# Patient Record
Sex: Female | Born: 1935 | Race: White | Hispanic: No | Smoking: Never smoker
Health system: Southern US, Community
[De-identification: ages and names within clinical notes are randomized; demographics above are authoritative.]

## PROBLEM LIST (undated history)

## (undated) DIAGNOSIS — R3915 Urgency of urination: Secondary | ICD-10-CM

## (undated) DIAGNOSIS — D649 Anemia, unspecified: Secondary | ICD-10-CM

## (undated) DIAGNOSIS — R32 Unspecified urinary incontinence: Secondary | ICD-10-CM

## (undated) DIAGNOSIS — G459 Transient cerebral ischemic attack, unspecified: Secondary | ICD-10-CM

## (undated) DIAGNOSIS — M810 Age-related osteoporosis without current pathological fracture: Secondary | ICD-10-CM

## (undated) DIAGNOSIS — G20A1 Parkinson's disease without dyskinesia, without mention of fluctuations: Secondary | ICD-10-CM

## (undated) DIAGNOSIS — C801 Malignant (primary) neoplasm, unspecified: Secondary | ICD-10-CM

## (undated) DIAGNOSIS — H269 Unspecified cataract: Secondary | ICD-10-CM

## (undated) DIAGNOSIS — M1990 Unspecified osteoarthritis, unspecified site: Secondary | ICD-10-CM

## (undated) DIAGNOSIS — I4891 Unspecified atrial fibrillation: Secondary | ICD-10-CM

## (undated) DIAGNOSIS — K449 Diaphragmatic hernia without obstruction or gangrene: Secondary | ICD-10-CM

## (undated) DIAGNOSIS — I10 Essential (primary) hypertension: Secondary | ICD-10-CM

## (undated) DIAGNOSIS — N952 Postmenopausal atrophic vaginitis: Secondary | ICD-10-CM

## (undated) DIAGNOSIS — C50919 Malignant neoplasm of unspecified site of unspecified female breast: Secondary | ICD-10-CM

## (undated) DIAGNOSIS — G20 Parkinson's disease: Secondary | ICD-10-CM

## (undated) DIAGNOSIS — K649 Unspecified hemorrhoids: Secondary | ICD-10-CM

## (undated) DIAGNOSIS — E538 Deficiency of other specified B group vitamins: Secondary | ICD-10-CM

## (undated) DIAGNOSIS — I2699 Other pulmonary embolism without acute cor pulmonale: Secondary | ICD-10-CM

## (undated) DIAGNOSIS — O2230 Deep phlebothrombosis in pregnancy, unspecified trimester: Secondary | ICD-10-CM

## (undated) DIAGNOSIS — K5790 Diverticulosis of intestine, part unspecified, without perforation or abscess without bleeding: Secondary | ICD-10-CM

## (undated) DIAGNOSIS — H15009 Unspecified scleritis, unspecified eye: Secondary | ICD-10-CM

## (undated) HISTORY — PX: INCONTINENCE SURGERY: SHX676

## (undated) HISTORY — PX: OTHER SURGICAL HISTORY: SHX169

## (undated) HISTORY — PX: NEUROMA SURGERY: SHX722

## (undated) HISTORY — DX: Unspecified osteoarthritis, unspecified site: M19.90

## (undated) HISTORY — DX: Age-related osteoporosis without current pathological fracture: M81.0

## (undated) HISTORY — DX: Parkinson's disease: G20

## (undated) HISTORY — PX: ABDOMINAL HYSTERECTOMY: SUR658

## (undated) HISTORY — DX: Diaphragmatic hernia without obstruction or gangrene: K44.9

## (undated) HISTORY — DX: Unspecified urinary incontinence: R32

## (undated) HISTORY — DX: Transient cerebral ischemic attack, unspecified: G45.9

## (undated) HISTORY — PX: FINGER SURGERY: SHX640

## (undated) HISTORY — DX: Unspecified cataract: H26.9

## (undated) HISTORY — DX: Postmenopausal atrophic vaginitis: N95.2

## (undated) HISTORY — DX: Parkinson's disease without dyskinesia, without mention of fluctuations: G20.A1

## (undated) HISTORY — PX: FOOT SURGERY: SHX648

## (undated) HISTORY — DX: Unspecified scleritis, unspecified eye: H15.009

## (undated) HISTORY — DX: Unspecified hemorrhoids: K64.9

## (undated) HISTORY — DX: Malignant neoplasm of unspecified site of unspecified female breast: C50.919

## (undated) HISTORY — DX: Urgency of urination: R39.15

## (undated) HISTORY — PX: ABDOMINAL HYSTERECTOMY: SHX81

## (undated) HISTORY — PX: EYE SURGERY: SHX253

---

## 1995-01-29 DIAGNOSIS — G459 Transient cerebral ischemic attack, unspecified: Secondary | ICD-10-CM

## 1995-01-29 HISTORY — DX: Transient cerebral ischemic attack, unspecified: G45.9

## 2003-06-29 HISTORY — PX: FOOT FRACTURE SURGERY: SHX645

## 2003-07-14 ENCOUNTER — Other Ambulatory Visit: Payer: Self-pay

## 2004-05-31 ENCOUNTER — Emergency Department: Payer: Self-pay | Admitting: Unknown Physician Specialty

## 2004-06-13 ENCOUNTER — Ambulatory Visit: Payer: Self-pay | Admitting: Ophthalmology

## 2004-06-15 ENCOUNTER — Ambulatory Visit: Payer: Self-pay | Admitting: Ophthalmology

## 2004-07-16 ENCOUNTER — Ambulatory Visit: Payer: Self-pay | Admitting: Internal Medicine

## 2005-08-21 ENCOUNTER — Ambulatory Visit: Payer: Self-pay | Admitting: Internal Medicine

## 2006-04-28 ENCOUNTER — Emergency Department: Payer: Self-pay | Admitting: Emergency Medicine

## 2006-05-13 ENCOUNTER — Ambulatory Visit: Payer: Self-pay | Admitting: Gastroenterology

## 2006-07-29 ENCOUNTER — Other Ambulatory Visit: Payer: Self-pay

## 2006-07-29 ENCOUNTER — Ambulatory Visit: Payer: Self-pay | Admitting: Ophthalmology

## 2006-08-05 ENCOUNTER — Ambulatory Visit: Payer: Self-pay | Admitting: Ophthalmology

## 2006-10-02 ENCOUNTER — Ambulatory Visit: Payer: Self-pay | Admitting: Ophthalmology

## 2006-10-28 ENCOUNTER — Ambulatory Visit: Payer: Self-pay | Admitting: Ophthalmology

## 2007-04-04 ENCOUNTER — Emergency Department: Payer: Self-pay | Admitting: Emergency Medicine

## 2007-08-26 ENCOUNTER — Ambulatory Visit: Payer: Self-pay | Admitting: Internal Medicine

## 2008-09-01 ENCOUNTER — Ambulatory Visit: Payer: Self-pay | Admitting: Internal Medicine

## 2009-09-14 ENCOUNTER — Ambulatory Visit: Payer: Self-pay | Admitting: Internal Medicine

## 2010-07-17 ENCOUNTER — Emergency Department: Payer: Self-pay | Admitting: *Deleted

## 2010-10-22 ENCOUNTER — Ambulatory Visit: Payer: Self-pay | Admitting: Internal Medicine

## 2011-10-23 ENCOUNTER — Ambulatory Visit: Payer: Self-pay

## 2012-10-26 ENCOUNTER — Ambulatory Visit: Payer: Self-pay

## 2012-10-28 HISTORY — PX: BREAST BIOPSY: SHX20

## 2012-11-06 ENCOUNTER — Ambulatory Visit: Payer: Self-pay

## 2012-11-13 ENCOUNTER — Ambulatory Visit: Payer: Self-pay

## 2012-11-16 LAB — PATHOLOGY REPORT

## 2012-12-02 ENCOUNTER — Ambulatory Visit: Payer: Self-pay | Admitting: Surgery

## 2012-12-08 ENCOUNTER — Ambulatory Visit: Payer: Self-pay | Admitting: Surgery

## 2012-12-09 LAB — PATHOLOGY REPORT

## 2013-08-16 ENCOUNTER — Emergency Department: Payer: Self-pay | Admitting: Emergency Medicine

## 2013-08-16 LAB — CBC WITH DIFFERENTIAL/PLATELET
BASOS ABS: 0.1 10*3/uL (ref 0.0–0.1)
BASOS PCT: 1.4 %
EOS ABS: 0.3 10*3/uL (ref 0.0–0.7)
Eosinophil %: 3.9 %
HCT: 43.7 % (ref 35.0–47.0)
HGB: 14.3 g/dL (ref 12.0–16.0)
Lymphocyte #: 2.4 10*3/uL (ref 1.0–3.6)
Lymphocyte %: 33.7 %
MCH: 30.4 pg (ref 26.0–34.0)
MCHC: 32.6 g/dL (ref 32.0–36.0)
MCV: 93 fL (ref 80–100)
MONO ABS: 0.8 x10 3/mm (ref 0.2–0.9)
MONOS PCT: 11.4 %
NEUTROS PCT: 49.6 %
Neutrophil #: 3.5 10*3/uL (ref 1.4–6.5)
PLATELETS: 240 10*3/uL (ref 150–440)
RBC: 4.7 10*6/uL (ref 3.80–5.20)
RDW: 14 % (ref 11.5–14.5)
WBC: 7 10*3/uL (ref 3.6–11.0)

## 2013-08-16 LAB — COMPREHENSIVE METABOLIC PANEL
ANION GAP: 8 (ref 7–16)
Albumin: 3.4 g/dL (ref 3.4–5.0)
Alkaline Phosphatase: 76 U/L
BUN: 18 mg/dL (ref 7–18)
Bilirubin,Total: 0.5 mg/dL (ref 0.2–1.0)
CALCIUM: 8.9 mg/dL (ref 8.5–10.1)
CO2: 27 mmol/L (ref 21–32)
Chloride: 106 mmol/L (ref 98–107)
Creatinine: 0.68 mg/dL (ref 0.60–1.30)
EGFR (African American): >60
Glucose: 90 mg/dL (ref 65–99)
OSMOLALITY: 283 (ref 275–301)
POTASSIUM: 3.3 mmol/L — AB (ref 3.5–5.1)
SGOT(AST): 19 U/L (ref 15–37)
SGPT (ALT): 12 U/L (ref 12–78)
Sodium: 141 mmol/L (ref 136–145)
TOTAL PROTEIN: 7.3 g/dL (ref 6.4–8.2)

## 2013-08-16 LAB — URINALYSIS, COMPLETE
Bilirubin,UR: NEGATIVE
Glucose,UR: NEGATIVE mg/dL (ref 0–75)
NITRITE: NEGATIVE
Ph: 5 (ref 4.5–8.0)
Protein: 100
RBC,UR: 1423 /HPF (ref 0–5)
SPECIFIC GRAVITY: 1.014 (ref 1.003–1.030)
Squamous Epithelial: 2
WBC UR: 3663 /HPF (ref 0–5)

## 2013-08-16 LAB — LIPASE, BLOOD: Lipase: 81 U/L (ref 73–393)

## 2013-08-16 LAB — TROPONIN I: Troponin-I: <0.02 ng/mL

## 2013-08-18 LAB — URINE CULTURE

## 2013-12-09 ENCOUNTER — Ambulatory Visit: Payer: Self-pay

## 2014-05-20 NOTE — Op Note (Signed)
PATIENT NAME:  Laura Cisneros, Laura Cisneros MR#:  [id number] DATE OF BIRTH:  07/05/1925  DATE OF PROCEDURE:  12/08/2012  PREOPERATIVE DIAGNOSIS: Left breast mass.   POSTOPERATIVE DIAGNOSIS: Left breast mass.   PROCEDURE: Excision of left breast mass.   SURGEON: Rochel Brome, M.D.   ANESTHESIA: General.   INDICATIONS: This 81 year old female recently had an ultrasound finding of a complex lesion at the 3 o'clock position of the left breast. She had ultrasound-guided biopsy, which demonstrated a complex lesion with benign findings; however, there remains suspicion for this mass and excision was recommended. She did have preoperative ultrasound guided needle localization. It is noted that with her prior biopsy. the biopsy marker was some 4 cm from the nodule and therefore the plan was not to remove the biopsy marker today, but just the mass.   DESCRIPTION OF THE PROCEDURE: The patient was placed on the operating table in the supine position under general anesthesia. The dressing was removed from the lateral aspect of the left breast exposing the Kopans wire, which was cut 2 cm from the skin. There was an X marking the skin overlying the mass. The breast was prepared with ChloraPrep and draped in a sterile manner.   A curvilinear incision was made in the outer aspect of the left breast from approximately 2 o'clock position to 3 o'clock position, approximately 4 cm from the nipple and carried down through subcutaneous tissues. Several small bleeding points were cauterized. The wire was encountered and dissected out a portion of tissue surrounding the wire and I could feel some firmness and mass formation within the specimen during the course of the dissection and excised the specimen and submitted with the wire in if for routine pathology. The wound was inspected. Several small bleeding points were cauterized. Deeper tissues around the cardia artifact were infiltrated with 0.5% Sensorcaine with epinephrine and also  subcutaneous tissues were infiltrated as well.   Next, the subcutaneous tissues were closed with interrupted 4-0 chromic. The skin was closed with running 4-0 Monocryl subcuticular suture and Dermabond. The patient tolerated surgery satisfactorily and was then prepared for transfer to the recovery room.   ____________________________ [name]. Rochel Brome, MD jws:aw D: 12/08/2012 11:38:48 ET T: 12/08/2012 11:59:44 ET JOB#: [id number]  cc: Loreli Dollar, MD, <Dictator> Loreli Dollar MD ELECTRONICALLY SIGNED 12/10/2012 10:26

## 2014-11-13 ENCOUNTER — Emergency Department
Admission: EM | Admit: 2014-11-13 | Discharge: 2014-11-13 | Disposition: A | Discharge status: Home / Self Care | Payer: Medicare Other | Attending: Emergency Medicine | Admitting: Emergency Medicine

## 2014-11-13 DIAGNOSIS — N390 Urinary tract infection, site not specified: Secondary | ICD-10-CM

## 2014-11-13 DIAGNOSIS — R350 Frequency of micturition: Secondary | ICD-10-CM | POA: Diagnosis present

## 2014-11-13 LAB — URINALYSIS COMPLETE WITH MICROSCOPIC (ARMC ONLY)
Bilirubin Urine: NEGATIVE
Glucose, UA: NEGATIVE mg/dL
NITRITE: NEGATIVE
PH: 5 (ref 5.0–8.0)
PROTEIN: 100 mg/dL — AB
SPECIFIC GRAVITY, URINE: 1.016 (ref 1.005–1.030)

## 2014-11-13 MED ORDER — CIPROFLOXACIN HCL 500 MG PO TABS: 500.0000 mg | ORAL_TABLET | Freq: Two times a day (BID) | ORAL | Status: DC

## 2014-11-13 MED ORDER — FLUCONAZOLE 150 MG PO TABS: 150.0000 mg | ORAL_TABLET | Freq: Every day | ORAL | Status: DC

## 2014-11-13 NOTE — ED Notes (Signed)
C/o urinary frequency and burning along with foul odor. No confusion noted.  Has a history  UTIs in the past.

## 2014-11-13 NOTE — ED Provider Notes (Signed)
[facility] Emergency Department Provider Note  ____________________________________________  Time seen: 10:20 AM  I have reviewed the triage vital signs and the nursing notes.   HISTORY  Chief Complaint Urinary Frequency    HPI Laura Cisneros is a 81 y.o. female who presents with complaints of dysuria and frequency. She notes this started approximately 2 days ago. She feels that she is not voiding completely and is voiding more frequently as well. She has mild discomfort with urinating. She reports this is exactly like a urinary tract infection in the past. She has not had any confusion according to her family. No fevers no chills. No back pain. No abdominal pain or flank pain. No nausea no vomiting. She reports she feels quite well and was considering waiting to go to urgent care tomorrow but decided it would be more convenient to come to the emergency department today so that does not get worse     No past medical history on file.  There are no active problems to display for this patient.   No past surgical history on file.  Current Outpatient Rx  Name  Route  Sig  Dispense  Refill  . ciprofloxacin (CIPRO) 500 MG tablet   Oral   Take 1 tablet (500 mg total) by mouth 2 (two) times daily.   14 tablet   0   . fluconazole (DIFLUCAN) 150 MG tablet   Oral   Take 1 tablet (150 mg total) by mouth daily.   2 tablet   0     One tablet on day 1 for yeast infection followed b ...     Allergies Review of patient's allergies indicates no known allergies.  No family history on file.  Social History Social History  Substance Use Topics  . Smoking status: Not on file  . Smokeless tobacco: Not on file  . Alcohol Use: Not on file    Review of Systems  Constitutional: Negative for fever. Eyes: Negative for discharge ENT: Negative for sore throat Cardiovascular: Negative for chest pain. Respiratory: Negative for shortness of  breath. Gastrointestinal: Negative for abdominal pain, vomiting  Genitourinary: Positive for dysuria, frequency Musculoskeletal: Negative for back pain. Skin: Negative for rash. Neurological: Negative for headaches      ____________________________________________   PHYSICAL EXAM:  VITAL SIGNS: ED Triage Vitals  Enc Vitals Group     BP 11/13/14 0907 125/69 mmHg     Pulse Rate 11/13/14 0907 14     Resp 11/13/14 0907 20     Temp 11/13/14 0907 98 F (36.7 C)     Temp Source 11/13/14 0907 Oral     SpO2 11/13/14 0907 94 %     Weight 11/13/14 0907 141 lb (63.957 kg)     Height 11/13/14 0907 5' (1.524 m)     Head Cir --      Peak Flow --      Pain Score --      Pain Loc --      Pain Edu? --      Excl. in [place]? --      Constitutional: Alert and oriented. Well appearing and in no distress. Pleasant and interactive Eyes: Conjunctivae are normal.  ENT   Head: Normocephalic and atraumatic.   Mouth/Throat: Mucous membranes are moist. Cardiovascular: Normal rate, regular rhythm. Normal and symmetric distal pulses are present in all extremities.  Respiratory: Normal respiratory effort without tachypnea nor retractions.  Gastrointestinal: Soft and non-tender in all quadrants. No distention. There  is no CVA tenderness. Genitourinary: deferred Musculoskeletal: Nontender with normal range of motion in all extremities.  Neurologic:  Normal speech and language. No gross focal neurologic deficits are appreciated. Skin:  Skin is warm, dry and intact. No rash noted. Psychiatric: Mood and affect are normal. Patient exhibits appropriate insight and judgment.  ____________________________________________    LABS (pertinent positives/negatives)  Labs Reviewed  URINALYSIS COMPLETEWITH MICROSCOPIC ([facility] ONLY) - Abnormal; Notable for the following:    Color, Urine YELLOW (*)    APPearance CLOUDY (*)    Ketones, ur TRACE (*)    Hgb urine dipstick 3+ (*)    Protein, ur 100 (*)     Leukocytes, UA 3+ (*)    Bacteria, UA RARE (*)    Squamous Epithelial / LPF 6-30 (*)    All other components within normal limits    ____________________________________________   EKG  None  ____________________________________________    RADIOLOGY I have personally reviewed any xrays that were ordered on this patient: None  ____________________________________________   PROCEDURES  Procedure(s) performed: none  Critical Care performed: none  ____________________________________________   INITIAL IMPRESSION / ASSESSMENT AND PLAN / ED COURSE  Pertinent labs & imaging results that were available during my care of the patient were reviewed by me and considered in my medical decision making (see chart for details).  History of present illness certainly consistent with urinary tract infection and urinalysis further supports that diagnosis. Patient is extremely well-appearing. She is nontoxic. Her vitals are unremarkable. Have no concern at this time of sepsis or pyelonephritis and feel that outpatient treatment is completely appropriate. I did discuss signs of a worsening urinary tract infection with the patient and her son and they agree that they will return if there is any concern  ____________________________________________   FINAL CLINICAL IMPRESSION(S) / ED DIAGNOSES  Final diagnoses:  UTI (lower urinary tract infection)      Lavonia Drafts, MD 11/13/14 1049

## 2014-11-13 NOTE — ED Notes (Signed)
AAOx3.  Skin warm and dry. Moving all extremities equally and strong.  NAD 

## 2014-11-13 NOTE — Discharge Instructions (Signed)

## 2014-11-13 NOTE — ED Notes (Signed)
Pt c/o increased urination, with incontinence, pain with urination since Friday..  Denies fever or other sx.Marland Kitchen

## 2015-09-06 ENCOUNTER — Encounter: Payer: Self-pay | Admitting: Urology

## 2015-09-06 ENCOUNTER — Other Ambulatory Visit: Payer: Self-pay

## 2015-09-06 ENCOUNTER — Ambulatory Visit (INDEPENDENT_AMBULATORY_CARE_PROVIDER_SITE_OTHER): Payer: Medicare Other | Admitting: Urology

## 2015-09-06 VITALS — BP 128/71 | HR 69 | Ht 60.0 in | Wt 142.2 lb

## 2015-09-06 DIAGNOSIS — R350 Frequency of micturition: Secondary | ICD-10-CM

## 2015-09-06 DIAGNOSIS — R32 Unspecified urinary incontinence: Secondary | ICD-10-CM | POA: Diagnosis not present

## 2015-09-06 DIAGNOSIS — N952 Postmenopausal atrophic vaginitis: Secondary | ICD-10-CM | POA: Diagnosis not present

## 2015-09-06 DIAGNOSIS — N390 Urinary tract infection, site not specified: Secondary | ICD-10-CM

## 2015-09-06 LAB — URINALYSIS, COMPLETE
Bilirubin, UA: NEGATIVE
GLUCOSE, UA: NEGATIVE
Nitrite, UA: NEGATIVE
PH UA: 5 (ref 5.0–7.5)
PROTEIN UA: NEGATIVE
RBC, UA: NEGATIVE
Specific Gravity, UA: 1.025 (ref 1.005–1.030)
UUROB: 0.2 mg/dL (ref 0.2–1.0)

## 2015-09-06 LAB — MICROSCOPIC EXAMINATION

## 2015-09-06 LAB — BLADDER SCAN AMB NON-IMAGING: Scan Result: 0

## 2015-09-06 NOTE — Progress Notes (Signed)
09/06/2015 9:34 PM   Laura Cisneros 06-Apr-1925 [id number]  Referring provider: Leonel Ramsay, MD [name], [name] [address]  Chief Complaint  Patient presents with  . Recurrent UTI    referred by Dr.Fitzgerald  . Urinary Frequency    HPI: Patient is an 82 year old Caucasian female who is referred by her PCP, Dr. Ola Cisneros, for urinary frequency and recurrent UTI's.  Patient states that she has had four urinary tract infections over the last year.  Her symptoms with a urinary tract infection consist of frequency, dysuria, urgency, low back pain and incontinence.  She denies gross hematuria, suprapubic pain, abdominal pain or flank pain.  She has not had any recent fevers, chills, nausea or vomiting.   She does not have a history of nephrolithiasis, GU surgery or GU trauma.   Reviewing her records,  she has had three documented UTI's over the last year.  +  Variable resistance  E. Coli on 01/24/2015  +  Variable resistance E. Coli on 06/28/2015  +  Variable resistance E. Coli on 08/07/2015  She is not sexually active.   She is post menopausal.  She admits to diarrhea.  She does engage in good perineal hygiene. She does not take tub baths.    She does not have incontinence.  She is using incontinence pads. Four pads daily.  PVR is 0 mL.    She underwent a non contrast CT in 2015 which did not identify any urinary tract stones, obstruction or other acute abnormality.    She is drinking a lo of water daily, taking cranberry tablets and taking vitamin C.     PMH: No past medical history on file.  Surgical History: Past Surgical History:  Procedure Laterality Date  . ABDOMINAL HYSTERECTOMY    . INCONTINENCE SURGERY      Home Medications:    Medication List       Accurate as of 09/06/15 11:59 PM. Always use your most recent med list.          acetaminophen 650 MG CR tablet Commonly known as:  TYLENOL Take by mouth.   CALCIUM PO Take by  mouth.   carbidopa-levodopa 50-200 MG tablet Commonly known as:  SINEMET CR Take by mouth.   ciprofloxacin 500 MG tablet Commonly known as:  CIPRO Take 1 tablet (500 mg total) by mouth 2 (two) times daily.   cyanocobalamin 1000 MCG/ML injection Commonly known as:  (VITAMIN B-12) INJECT 1ML INTO MUSCLE ONCE A MONTH AS DIRECTED   ferrous sulfate 325 (65 FE) MG tablet Take by mouth.   fluconazole 150 MG tablet Commonly known as:  DIFLUCAN Take 1 tablet (150 mg total) by mouth daily.   hydrochlorothiazide 12.5 MG capsule Commonly known as:  MICROZIDE Take 12.5 mg by mouth daily.   metoprolol succinate 50 MG 24 hr tablet Commonly known as:  TOPROL-XL       Allergies: No Known Allergies  Family History: Family History  Problem Relation Age of Onset  . Kidney disease Neg Hx   . Bladder Cancer Neg Hx     Social History:  reports that she has never smoked. She has never used smokeless tobacco. She reports that she does not drink alcohol or use drugs.  ROS: UROLOGY Frequent Urination?: Yes Hard to postpone urination?: Yes Burning/pain with urination?: Yes Get up at night to urinate?: Yes Leakage of urine?: No Urine stream starts and stops?: No Trouble starting stream?: No Do you  have to strain to urinate?: No Blood in urine?: No Urinary tract infection?: Yes Sexually transmitted disease?: No Injury to kidneys or bladder?: No Painful intercourse?: No Weak stream?: No Currently pregnant?: No Vaginal bleeding?: No Last menstrual period?: n  Gastrointestinal Nausea?: No Vomiting?: No Indigestion/heartburn?: No Diarrhea?: Yes Constipation?: No  Constitutional Fever: No Night sweats?: No Weight loss?: No Fatigue?: Yes  Skin Skin rash/lesions?: No Itching?: No  Eyes Blurred vision?: No Double vision?: No  Ears/Nose/Throat Sore throat?: No Sinus problems?: No  Hematologic/Lymphatic Swollen glands?: No Easy bruising?: Yes  Cardiovascular Leg  swelling?: Yes Chest pain?: No  Respiratory Cough?: No Shortness of breath?: No  Endocrine Excessive thirst?: Yes  Musculoskeletal Back pain?: Yes Joint pain?: Yes  Neurological Headaches?: No Dizziness?: No  Psychologic Depression?: No Anxiety?: No  Physical Exam: BP 128/71   Pulse 69   Ht 5' (1.524 m)   Wt 142 lb 3.2 oz (64.5 kg)   BMI 27.77 kg/m   Constitutional: Well nourished. Alert and oriented, No acute distress. HEENT: [place] AT, moist mucus membranes. Trachea midline, no masses. Cardiovascular: No clubbing, cyanosis, or edema. Respiratory: Normal respiratory effort, no increased work of breathing. GI: Abdomen is soft, non tender, non distended, no abdominal masses. Liver and spleen not palpable.  No hernias appreciated.  Stool sample for occult testing is not indicated.   GU: No CVA tenderness.  No bladder fullness or masses.  Atrophic external genitalia, normal pubic hair distribution, no lesions.  Normal urethral meatus, no lesions, no prolapse, no discharge.   No urethral masses, tenderness and/or tenderness. No bladder fullness, tenderness or masses. Pale vagina mucosa, poor estrogen effect, no discharge, no lesions, good pelvic support, no cystocele or rectocele noted.  Introitus is narrow.  Cervix and uterus are surgically absent.  No adnexal/parametria masses or tenderness noted.  Anus and perineum are without rashes or lesions.    Skin: No rashes, bruises or suspicious lesions. Lymph: No cervical or inguinal adenopathy. Neurologic: Grossly intact, no focal deficits, moving all 4 extremities. Psychiatric: Normal mood and affect.  Laboratory Data: Lab Results  Component Value Date   WBC 7.0 08/16/2013   HGB 14.3 08/16/2013   HCT 43.7 08/16/2013   MCV 93 08/16/2013   PLT 240 08/16/2013    Lab Results  Component Value Date   CREATININE 0.68 08/16/2013    Lab Results  Component Value Date   AST 19 08/16/2013   Lab Results  Component Value Date    ALT 12 08/16/2013     Urinalysis Significant for > 30 WBC's and 3-10 RBC's.  Sent for culture.  See EPIC.  Pertinent Imaging: Results for Laura Cisneros, Laura Cisneros (MRN [id number]) as of 09/09/2015 21:12  Ref. Range 09/06/2015 11:45  Scan Result Unknown 0    Assessment & Plan:    1. Recurrent UTI's  - remind to increase her water intake until the urine is pale yellow or clear   - advised her to take probiotics (yogurt, oral pills or vaginal suppositories)  - continue to take cranberry pills or drink the juice  - start using estrogen cream     - continue to take Vitamin C 1,000 mg daily to acidify the urine  - continue to avoid soaking in tubs and wipe front to back after urinating   - asked the patient to contact our office if she should experience symptoms of urinary tract infection so that we can CATH her for an urine specimen for urinalysis and culture. This is to  prevent a skin contaminant from showing up in the urine culture.  If she should have her symptoms after hours or cannot get to our office, she should notify her other providers that she needs a catheterized specimen for UA and culture  - reviewed the symptoms of a urinary tract infection, such as a worsening of urinary urgency and frequency, dysuria, which is painful urination and not the pain of urine hitting sensitive perineal skin, hematuria, foul-smelling urine, suprapubic pain or mental status changes. Fevers, chills, nausea and or vomiting can also be signs of a possible UTI.  Positive urinalyses and positive urine cultures that are not associated with urinary symptoms should not be treated with antibiotics  - explained to the patient that being exposed to unnecessary antibiotics can put her at risk for increasing resistance of the bacteria to antibiotics, C. difficile and the side effects of the antibiotics  2. Incontinence  - PVR is 0 mL  - states only occurs during UTI's  - started estrogen cream  - reassess in two weeks and after  urine culture results are available           - BLADDER SCAN AMB NON-IMAGING  3. Vaginal atrophy  - I explained to the patient that when women go through menopause and her estrogen levels are severely diminished, the normal vaginal flora will change.  This is due to an increase of the vaginal canal's pH. Because of this, the vaginal canal may be colonized by bacteria from the rectum instead of the protective lactobacillus.  This accompanied by the loss of the mucus barrier with vaginal atrophy is a cause of recurrent urinary tract infections.  In some studies, it has been demonstrated that patients can experience a reduction in recurrent urinary tract infections to one a year.   Patient was given a sample of vaginal estrogen cream (Premarin) and instructed to apply 0.5mg  (pea-sized amount)  just inside the vaginal introitus with a finger-tip every night for two weeks and then Monday, Wednesday and Friday nights.  I explained to the patient that vaginally administered estrogen, which causes only a slight increase in the blood estrogen levels, have fewer contraindications and adverse systemic effects that oral HT.  I have also given prescriptions for the Estrace cream and Premarin cream, so that the patient may carry them to the pharmacy to see which one of the branded creams would be most economical for her.  She will return in 2 weeks for symptom recheck, exam and report if she was available to require the vaginal cream by prescription.  Return in about 2 weeks (around 09/20/2015) for exam and symptom recheck.  These notes generated with voice recognition software. I apologize for typographical errors.  Zara Council, [facility] [address], [place], [place] [address] [phone]

## 2015-09-06 NOTE — Patient Instructions (Signed)
                                             Urinary Tract Infection Prevention Patient Education Stay Hydrated: Urinary tract infections (UTIs) are less likely to occur in someone who is drinking enough water to promote regular urination, so it is very important to stay hydrated in order to help flush out bacteria from the urinary tract. Respond to "Nature's Call": It is always a good idea to urinate as soon as you feel the need. While "holding it in" does not directly cause an infection, it can cause overdistension that can damage the lining of the bladder, making it more vulnerable to bacteria. Remove Tampons Before Going: Remember to always take out tampons before urinating, and change tampons often.  Practice Proper Bathroom Hygiene: To keep bacteria near the urethral opening to a minimum, it is important to practice proper wiping techniques (i.e. front to back wiping) to help prevent rectal bacteria from entering the uretro-genital area. It can also be helpful to take showers and avoid soaking in the bathtub.  Take a Vitamin C Supplement: About 1,000 milligrams of vitamin C taken daily can help inhibit the growth of some bacteria by acidifying the urine. Maintain Control with Cranberries: Cranberries contain hippuronic acid, which is a natural antiseptic that may help prevent the adherence of bacteria to the bladder lining. Drinking 100% pure cranberry juice or taking over the counter cranberry supplements twice daily may help to prevent an infection. However, it is important to note that cranberry juices/supplements are not helpful once a urinary tract infection (UTI) is present. Strengthen Your Core: Often, a lazy bladder (unable to empty urine properly) occurs due to lower back problem, so consider doing exercises to help strengthen your back, pelvic floor, and stomach muscles.  Pay Attention to Your Urine: Your urine can change color for a variety of reasons, including from the medications you  take, so pay close attention to it to monitor your overall health. One key thing to note is that if your urine is typically a darker yellow, your body is dehydrated, so you need to step up your water intake.    You are given a sample of vaginal estrogen cream (Premarin) and instructed to apply 0.5mg  (pea-sized amount)  just inside the vaginal introitus with a finger-tip every night for two weeks.

## 2015-09-08 ENCOUNTER — Telehealth: Payer: Self-pay | Admitting: Urology

## 2015-09-08 LAB — CULTURE, URINE COMPREHENSIVE

## 2015-09-08 NOTE — Telephone Encounter (Signed)
Her urine culture is not available yet, but they can check with the pharmacy over the weekend to see if an antibiotic was sent in.

## 2015-09-08 NOTE — Telephone Encounter (Signed)
Spoke with patient to let her know culture not back yet but to check her pharmacy over the weekend. Patient states thanks for letting her know.

## 2015-09-09 ENCOUNTER — Other Ambulatory Visit: Payer: Self-pay | Admitting: Urology

## 2015-09-09 MED ORDER — AMOXICILLIN-POT CLAVULANATE 875-125 MG PO TABS: 1.0000 | ORAL_TABLET | Freq: Two times a day (BID) | ORAL | 0 refills | Status: DC

## 2015-09-09 NOTE — Progress Notes (Signed)
Please notify the patient that they have a positive urine culture.  An antibiotic has been sent to [facility].

## 2015-09-11 ENCOUNTER — Other Ambulatory Visit: Payer: Self-pay | Admitting: Urology

## 2015-09-11 ENCOUNTER — Telehealth: Payer: Self-pay | Admitting: Urology

## 2015-09-11 NOTE — Telephone Encounter (Signed)
Patient's son notified that Augmentin was called into her pharmacy for positive UTI.  She is requesting diflucan to take along with the abx to avoid getting a yeast infection.  Is that something that you would do?  Please advise.

## 2015-09-20 ENCOUNTER — Encounter: Payer: Self-pay | Admitting: Urology

## 2015-09-20 ENCOUNTER — Ambulatory Visit (INDEPENDENT_AMBULATORY_CARE_PROVIDER_SITE_OTHER): Payer: Medicare Other | Admitting: Urology

## 2015-09-20 VITALS — BP 154/80 | HR 69 | Ht 60.0 in | Wt 141.1 lb

## 2015-09-20 DIAGNOSIS — R32 Unspecified urinary incontinence: Secondary | ICD-10-CM

## 2015-09-20 DIAGNOSIS — N390 Urinary tract infection, site not specified: Secondary | ICD-10-CM

## 2015-09-20 DIAGNOSIS — N952 Postmenopausal atrophic vaginitis: Secondary | ICD-10-CM

## 2015-09-20 NOTE — Progress Notes (Signed)
09/20/2015 2:30 PM   Laura Cisneros Nov 11, 1925 [id number]  Referring provider: Leonel Ramsay, MD [place], [place] [address]  Chief Complaint  Patient presents with  . Vaginal Atrophy    2 week follow up     HPI: Patient is an 82 year old Caucasian female who presents today for an exam after starting vaginal estrogen cream.    Background history Patient was referred by her PCP, Dr. Ola Spurr, for urinary frequency and recurrent UTI's.  Patient states that she has had four urinary tract infections over the last year.  Her symptoms with a urinary tract infection consist of frequency, dysuria, urgency, low back pain and incontinence.  She denies gross hematuria, suprapubic pain, abdominal pain or flank pain.  She has not had any recent fevers, chills, nausea or vomiting.   She does not have a history of nephrolithiasis, GU surgery or GU trauma.   Reviewing her records,  she has had three documented UTI's over the last year.  +  Variable resistance  E. Coli on 01/24/2015  +  Variable resistance E. Coli on 06/28/2015  +  Variable resistance E. Coli on 08/07/2015  +  Pan sensitive E. Coli on 09/06/2015  She is not sexually active.   She is post menopausal.  She admits to diarrhea.  She does engage in good perineal hygiene. She does not take tub baths.  She does  have incontinence.  She is using incontinence pads. Four pads daily.  PVR is 0 mL.    She underwent a non contrast CT in 2015 which did not identify any urinary tract stones, obstruction or other acute abnormality.    She is drinking a lot of water daily, taking cranberry tablets and taking vitamin C.    Today, she is feeling tired.  She attributes this to her age.  The incontinence has lessened significantly, but it is still present.  She is down to one to two pads daily.  She is not having dysuria, urgency, low back pain or frequency.  She is not having fevers,  chills, nausea or vomiting.     PMH: Past Medical History:  Diagnosis Date  . Atrophic vaginitis   . Incontinence   . Parkinson disease [facility])     Surgical History: Past Surgical History:  Procedure Laterality Date  . ABDOMINAL HYSTERECTOMY    . INCONTINENCE SURGERY      Home Medications:    Medication List       Accurate as of 09/20/15  2:30 PM. Always use your most recent med list.          acetaminophen 650 MG CR tablet Commonly known as:  TYLENOL Take by mouth.   amoxicillin-clavulanate 875-125 MG tablet Commonly known as:  AUGMENTIN Take 1 tablet by mouth every 12 (twelve) hours.   CALCIUM PO Take by mouth.   carbidopa-levodopa 50-200 MG tablet Commonly known as:  SINEMET CR Take by mouth.   ciprofloxacin 500 MG tablet Commonly known as:  CIPRO Take 1 tablet (500 mg total) by mouth 2 (two) times daily.   cyanocobalamin 1000 MCG/ML injection Commonly known as:  (VITAMIN B-12) INJECT 1ML INTO MUSCLE ONCE A MONTH AS DIRECTED   estradiol 0.1 MG/GM vaginal cream Commonly known as:  ESTRACE Place 1 Applicatorful vaginally at bedtime.   ferrous sulfate 325 (65 FE) MG tablet Take by mouth.   fluconazole 150 MG tablet Commonly known as:  DIFLUCAN TAKE  ONE TABLET BY MOUTH ONCE FOR 1 DOSE.   hydrochlorothiazide 12.5 MG capsule Commonly known as:  MICROZIDE Take 12.5 mg by mouth daily.   metoprolol succinate 50 MG 24 hr tablet Commonly known as:  TOPROL-XL       Allergies: No Known Allergies  Family History: Family History  Problem Relation Age of Onset  . Kidney disease Neg Hx   . Bladder Cancer Neg Hx     Social History:  reports that she has never smoked. She has never used smokeless tobacco. She reports that she does not drink alcohol or use drugs.  ROS: UROLOGY Frequent Urination?: No Hard to postpone urination?: No Burning/pain with urination?: No Get up at night to urinate?: No Leakage of urine?: No Urine stream starts and  stops?: No Trouble starting stream?: No Do you have to strain to urinate?: No Blood in urine?: No Urinary tract infection?: No Sexually transmitted disease?: No Injury to kidneys or bladder?: No Painful intercourse?: No Weak stream?: No Currently pregnant?: No Vaginal bleeding?: No Last menstrual period?: n  Gastrointestinal Nausea?: No Vomiting?: No Indigestion/heartburn?: No Diarrhea?: No Constipation?: No  Constitutional Fever: No Night sweats?: No Weight loss?: No Fatigue?: No  Skin Skin rash/lesions?: No Itching?: No  Eyes Blurred vision?: No Double vision?: No  Ears/Nose/Throat Sore throat?: No Sinus problems?: No  Hematologic/Lymphatic Swollen glands?: No Easy bruising?: No  Cardiovascular Leg swelling?: No Chest pain?: No  Respiratory Cough?: No Shortness of breath?: No  Endocrine Excessive thirst?: No  Musculoskeletal Back pain?: No Joint pain?: No  Neurological Headaches?: No Dizziness?: No  Psychologic Depression?: No Anxiety?: No  Physical Exam: BP (!) 154/80   Pulse 69   Ht 5' (1.524 m)   Wt 141 lb 1.6 oz (64 kg)   BMI 27.56 kg/m   Constitutional: Well nourished. Alert and oriented, No acute distress. HEENT: [place] AT, moist mucus membranes. Trachea midline, no masses. Cardiovascular: No clubbing, cyanosis, or edema. Respiratory: Normal respiratory effort, no increased work of breathing. GI: Abdomen is soft, non tender, non distended, no abdominal masses. Liver and spleen not palpable.  No hernias appreciated.  Stool sample for occult testing is not indicated.   GU: No CVA tenderness.  No bladder fullness or masses.  Atrophic external genitalia, normal pubic hair distribution, no lesions.  Normal urethral meatus, no lesions, no prolapse, no discharge.   No urethral masses, tenderness and/or tenderness. No bladder fullness, tenderness or masses. Pale vagina mucosa, poor estrogen effect, no discharge, no lesions, good pelvic  support, no cystocele or rectocele noted.  Introitus is narrow.  Cervix and uterus are surgically absent.  No adnexal/parametria masses or tenderness noted.  Anus and perineum are without rashes or lesions.    Skin: No rashes, bruises or suspicious lesions. Lymph: No cervical or inguinal adenopathy. Neurologic: Grossly intact, no focal deficits, moving all 4 extremities. Psychiatric: Normal mood and affect.  Laboratory Data: Lab Results  Component Value Date   WBC 7.0 08/16/2013   HGB 14.3 08/16/2013   HCT 43.7 08/16/2013   MCV 93 08/16/2013   PLT 240 08/16/2013    Lab Results  Component Value Date   CREATININE 0.68 08/16/2013    Lab Results  Component Value Date   AST 19 08/16/2013   Lab Results  Component Value Date   ALT 12 08/16/2013   Pertinent Imaging: Results for Laura, Cisneros (MRN [id number]) as of 09/09/2015 21:12  Ref. Range 09/06/2015 11:45  Scan Result Unknown 0    Assessment &  Plan:    1. Recurrent UTI's  - reviewed UTI prevention  - remind patient to contact our office when she has symptoms of an UTI  2. Incontinence  - PVR was 0 mL  - still occurring after completion of antibiotics  - continue estrogen cream  - start a trial of Myrbetriq 25 mg daily  - present to clinic in 3 weeks for PVR and symptom recheck   3. Vaginal atrophy  - continue applying the cream three nights weekly, sample given  Return in about 3 weeks (around 10/11/2015) for PVR and symptom recheck.  These notes generated with voice recognition software. I apologize for typographical errors.  Zara Council, [facility] [address], [place], [place] [address] [phone]

## 2015-09-20 NOTE — Patient Instructions (Addendum)
You are given a sample of vaginal estrogen cream (Estrace) and instructed to apply 0.5mg  (pea-sized amount)  just inside the vaginal introitus with a finger-tip applying it 3 nights weekly.  Mirabegron extended-release tablets What is this medicine? MIRABEGRON (MIR a BEG ron) is used to treat overactive bladder. This medicine reduces the amount of bathroom visits. It may also help to control wetting accidents. This medicine may be used for other purposes; ask your health care provider or pharmacist if you have questions. What should I tell my health care provider before I take this medicine? They need to know if you have any of these conditions: -difficulty passing urine -high blood pressure -kidney disease -liver disease -an unusual or allergic reaction to mirabegron, other medicines, foods, dyes, or preservatives -pregnant or trying to get pregnant -breast-feeding How should I use this medicine? Take this medicine by mouth with a glass of water. Follow the directions on the prescription label. Do not cut, crush or chew this medicine. You can take it with or without food. If it upsets your stomach, take it with food. Take your medicine at regular intervals. Do not take it more often than directed. Do not stop taking except on your doctor's advice. Talk to your pediatrician regarding the use of this medicine in children. Special care may be needed. Overdosage: If you think you have taken too much of this medicine contact a poison control center or emergency room at once. NOTE: This medicine is only for you. Do not share this medicine with others. What if I miss a dose? If you miss a dose, take it as soon as you can. If it is almost time for your next dose, take only that dose. Do not take double or extra doses. What may interact with this medicine? -certain medicines for bladder problems like fesoterodine, oxybutynin, solifenacin,  tolterodine -desipramine -digoxin -flecainide -ketoconazole -MAOIs like Carbex, Eldepryl, Marplan, Nardil, and Parnate -metoprolol -propafenone -thioridazine -warfarin This list may not describe all possible interactions. Give your health care provider a list of all the medicines, herbs, non-prescription drugs, or dietary supplements you use. Also tell them if you smoke, drink alcohol, or use illegal drugs. Some items may interact with your medicine. What should I watch for while using this medicine? It may take 8 weeks to notice the full benefit from this medicine. You may need to limit your intake tea, coffee, caffeinated sodas, and alcohol. These drinks may make your symptoms worse. Visit your doctor or health care professional for regular checks on your progress. Check your blood pressure as directed. Ask your doctor or health care professional what your blood pressure should be and when you should contact him or her. What side effects may I notice from receiving this medicine? Side effects that you should report to your doctor or health care professional as soon as possible: -allergic reactions like skin rash, itching or hives, swelling of the face, lips, or tongue -chest pain or palpitations -severe or sudden headache -high blood pressure -fast, irregular heartbeat -redness, blistering, peeling or loosening of the skin, including inside the mouth -signs of infection like fever or chills; cough; sore throat; pain or difficulty passing urine -trouble passing urine or change in the amount of urine Side effects that usually do not require medical attention (Report these to your doctor or health care professional if they continue or are bothersome.): -constipation -diarrhea -dizziness -dry eyes -joint pain -mild headache -nausea -runny nose This list may not describe all possible side effects.  Call your doctor for medical advice about side effects. You may report side effects to  FDA at 1-800-FDA-1088. Where should I keep my medicine? Keep out of the reach of children. Store at room temperature between 15 and 30 degrees C (59 and 86 degrees F). Throw away any unused medicine after the expiration date. NOTE: This sheet is a summary. It may not cover all possible information. If you have questions about this medicine, talk to your doctor, pharmacist, or health care provider.    2016, Elsevier/Gold Standard. (2014-09-15 10:22:20)

## 2015-10-03 ENCOUNTER — Other Ambulatory Visit: Payer: Self-pay | Admitting: Infectious Diseases

## 2015-10-03 DIAGNOSIS — Z1231 Encounter for screening mammogram for malignant neoplasm of breast: Secondary | ICD-10-CM

## 2015-10-12 ENCOUNTER — Encounter: Payer: Self-pay | Admitting: Urology

## 2015-10-12 ENCOUNTER — Ambulatory Visit (INDEPENDENT_AMBULATORY_CARE_PROVIDER_SITE_OTHER): Payer: Medicare Other | Admitting: Urology

## 2015-10-12 VITALS — BP 154/75 | HR 66 | Ht 60.0 in | Wt 140.9 lb

## 2015-10-12 DIAGNOSIS — N390 Urinary tract infection, site not specified: Secondary | ICD-10-CM | POA: Diagnosis not present

## 2015-10-12 DIAGNOSIS — R32 Unspecified urinary incontinence: Secondary | ICD-10-CM | POA: Diagnosis not present

## 2015-10-12 DIAGNOSIS — N952 Postmenopausal atrophic vaginitis: Secondary | ICD-10-CM

## 2015-10-12 LAB — BLADDER SCAN AMB NON-IMAGING: Scan Result: 0

## 2015-10-12 MED ORDER — ESTRADIOL 0.1 MG/GM VA CREA: TOPICAL_CREAM | VAGINAL | 12 refills | Status: DC

## 2015-10-12 MED ORDER — ESTROGENS, CONJUGATED 0.625 MG/GM VA CREA: 1.0000 | TOPICAL_CREAM | Freq: Every day | VAGINAL | 12 refills | Status: DC

## 2015-10-12 MED ORDER — MIRABEGRON ER 25 MG PO TB24: 25.0000 mg | ORAL_TABLET | Freq: Every day | ORAL | 4 refills | Status: DC

## 2015-10-12 NOTE — Progress Notes (Signed)
10/12/2015 1:16 PM   [name] 03-06-1925 [id number]  Referring provider: Leonel Ramsay, MD [place], [place] [address]  Chief Complaint  Patient presents with  . Recurrent UTI    3 week follow up  . Vaginal Atrophy    HPI: Patient is an 82 year old Caucasian female who presents today for an exam after starting Myrbetriq 25 mg daily for incontinence.    Background history Patient was referred by her PCP, Dr. Ola Spurr, for urinary frequency and recurrent UTI's.  Patient states that she has had four urinary tract infections over the last year.  Her symptoms with a urinary tract infection consist of frequency, dysuria, urgency, low back pain and incontinence.  She denies gross hematuria, suprapubic pain, abdominal pain or flank pain.  She has not had any recent fevers, chills, nausea or vomiting.   She does not have a history of nephrolithiasis, GU surgery or GU trauma.   Reviewing her records,  she has had three documented UTI's over the last year.  +  Variable resistance  E. Coli on 01/24/2015  +  Variable resistance E. Coli on 06/28/2015  +  Variable resistance E. Coli on 08/07/2015  +  Pan sensitive E. Coli on 09/06/2015  She is not sexually active.   She is post menopausal.  She admits to diarrhea.  She does engage in good perineal hygiene. She does not take tub baths.  She does  have incontinence.  She is using incontinence pads. Four pads daily.  PVR was 0 mL.    She underwent a non contrast CT in 2015 which did not identify any urinary tract stones, obstruction or other acute abnormality.    She is drinking a lot of water daily, taking cranberry tablets and taking vitamin C.    At her last visit,  the incontinence had lessened significantly with the vaginal estrogen cream.  but it was still present.  She was down to one to two pads daily.    We started her on Myrbetriq 25 mg daily at her visit 3 weeks  ago.  She has noticed an improvement in her incontinence to once daily.  She is not having dysuria, urgency, low back pain or frequency.  She is not having fevers, chills, nausea or vomiting.  Her PVR is 0 mL at today's exam.  She would like to continue the Myrbetriq.    PMH: Past Medical History:  Diagnosis Date  . Atrophic vaginitis   . Incontinence   . Parkinson disease [facility])     Surgical History: Past Surgical History:  Procedure Laterality Date  . ABDOMINAL HYSTERECTOMY    . INCONTINENCE SURGERY      Home Medications:    Medication List       Accurate as of 10/12/15 11:59 PM. Always use your most recent med list.          acetaminophen 650 MG CR tablet Commonly known as:  TYLENOL Take by mouth.   amoxicillin-clavulanate 875-125 MG tablet Commonly known as:  AUGMENTIN Take 1 tablet by mouth every 12 (twelve) hours.   CALCIUM PO Take by mouth.   carbidopa-levodopa 50-200 MG tablet Commonly known as:  SINEMET CR Take by mouth.   ciprofloxacin 500 MG tablet Commonly known as:  CIPRO Take 1 tablet (500 mg total) by mouth 2 (two) times daily.   conjugated estrogens vaginal cream Commonly known as:  PREMARIN Place 1 Applicatorful vaginally  daily. Apply 0.5mg  (pea-sized amount)  just inside the vaginal introitus with a finger-tip every night for two weeks and then Monday, Wednesday and Friday nights.   cyanocobalamin 1000 MCG/ML injection Commonly known as:  (VITAMIN B-12) INJECT 1ML INTO MUSCLE ONCE A MONTH AS DIRECTED   estradiol 0.1 MG/GM vaginal cream Commonly known as:  ESTRACE Place 1 Applicatorful vaginally at bedtime.   estradiol 0.1 MG/GM vaginal cream Commonly known as:  ESTRACE VAGINAL Apply 0.5mg  (pea-sized amount)  just inside the vaginal introitus with a finger-tip every night for two weeks and then Monday, Wednesday and Friday nights.   ferrous sulfate 325 (65 FE) MG tablet Take by mouth.   fluconazole 150 MG tablet Commonly known as:   DIFLUCAN TAKE ONE TABLET BY MOUTH ONCE FOR 1 DOSE.   hydrochlorothiazide 12.5 MG capsule Commonly known as:  MICROZIDE Take 12.5 mg by mouth daily.   metoprolol succinate 50 MG 24 hr tablet Commonly known as:  TOPROL-XL   mirabegron ER 25 MG Tb24 tablet Commonly known as:  MYRBETRIQ Take 1 tablet (25 mg total) by mouth daily.       Allergies: No Known Allergies  Family History: Family History  Problem Relation Age of Onset  . Kidney disease Neg Hx   . Bladder Cancer Neg Hx     Social History:  reports that she has never smoked. She has never used smokeless tobacco. She reports that she does not drink alcohol or use drugs.  ROS: UROLOGY Frequent Urination?: No Hard to postpone urination?: No Burning/pain with urination?: No Get up at night to urinate?: No Leakage of urine?: No Urine stream starts and stops?: No Trouble starting stream?: No Do you have to strain to urinate?: No Blood in urine?: No Urinary tract infection?: No Sexually transmitted disease?: No Injury to kidneys or bladder?: No Painful intercourse?: No Weak stream?: No Currently pregnant?: No Vaginal bleeding?: No Last menstrual period?: n  Gastrointestinal Nausea?: No Vomiting?: No Indigestion/heartburn?: No Diarrhea?: No Constipation?: No  Constitutional Fever: No Night sweats?: No Weight loss?: No Fatigue?: No  Skin Skin rash/lesions?: No Itching?: No  Eyes Blurred vision?: No Double vision?: No  Ears/Nose/Throat Sore throat?: No Sinus problems?: No  Hematologic/Lymphatic Swollen glands?: No Easy bruising?: No  Cardiovascular Leg swelling?: No Chest pain?: No  Respiratory Cough?: No Shortness of breath?: No  Endocrine Excessive thirst?: No  Musculoskeletal Back pain?: No Joint pain?: No  Neurological Headaches?: No Dizziness?: No  Psychologic Depression?: No Anxiety?: No  Physical Exam: BP (!) 154/75   Pulse 66   Ht 5' (1.524 m)   Wt 140 lb 14.4 oz  (63.9 kg)   BMI 27.52 kg/m   Constitutional: Well nourished. Alert and oriented, No acute distress. HEENT: [place] AT, moist mucus membranes. Trachea midline, no masses. Cardiovascular: No clubbing, cyanosis, or edema. Respiratory: Normal respiratory effort, no increased work of breathing. Skin: No rashes, bruises or suspicious lesions. Lymph: No cervical or inguinal adenopathy. Neurologic: Grossly intact, no focal deficits, moving all 4 extremities. Psychiatric: Normal mood and affect.  Laboratory Data: Lab Results  Component Value Date   WBC 7.0 08/16/2013   HGB 14.3 08/16/2013   HCT 43.7 08/16/2013   MCV 93 08/16/2013   PLT 240 08/16/2013    Lab Results  Component Value Date   CREATININE 0.68 08/16/2013    Lab Results  Component Value Date   AST 19 08/16/2013   Lab Results  Component Value Date   ALT 12 08/16/2013   Pertinent  Imaging: Results for LIZETH, WEES (MRN [id number]) as of 10/18/2015 13:13  Ref. Range 10/12/2015 14:40  Scan Result Unknown 0   Assessment & Plan:    1. Recurrent UTI's  - reviewed UTI prevention  - remind patient to contact our office when she has symptoms of an UTI  2. Incontinence  - PVR was 0 mL  - continue estrogen cream  - continue Myrbetriq 25 mg daily, prescription sent to pharmacy  - present to clinic in 3 months for PVR and symptom recheck   3. Vaginal atrophy  - continue applying the cream three nights weekly, sample given  - RTC in 3 months for an exam  Return in about 3 months (around 01/11/2016) for PVR and exam.  These notes generated with voice recognition software. I apologize for typographical errors.  Zara Council, [facility] [address], [place], [place] [address] [phone]

## 2015-10-19 ENCOUNTER — Other Ambulatory Visit: Payer: Self-pay | Admitting: Infectious Diseases

## 2015-10-19 ENCOUNTER — Ambulatory Visit: Payer: Medicare Other

## 2015-10-19 ENCOUNTER — Ambulatory Visit
Admission: RE | Admit: 2015-10-19 | Discharge: 2015-10-19 | Disposition: A | Discharge status: Home / Self Care | Payer: Medicare Other | Source: Ambulatory Visit | Attending: Infectious Diseases | Admitting: Infectious Diseases

## 2015-10-19 DIAGNOSIS — Z1231 Encounter for screening mammogram for malignant neoplasm of breast: Secondary | ICD-10-CM

## 2015-10-20 ENCOUNTER — Other Ambulatory Visit: Payer: Self-pay | Admitting: Infectious Diseases

## 2015-10-23 ENCOUNTER — Other Ambulatory Visit: Payer: Self-pay | Admitting: Infectious Diseases

## 2015-10-23 DIAGNOSIS — N6320 Unspecified lump in the left breast, unspecified quadrant: Secondary | ICD-10-CM

## 2015-10-23 DIAGNOSIS — N6310 Unspecified lump in the right breast, unspecified quadrant: Secondary | ICD-10-CM

## 2015-10-23 DIAGNOSIS — R928 Other abnormal and inconclusive findings on diagnostic imaging of breast: Secondary | ICD-10-CM

## 2015-10-25 ENCOUNTER — Telehealth: Payer: Self-pay | Admitting: Urology

## 2015-10-25 NOTE — Telephone Encounter (Signed)
Pt's son called about a prior auth for Myrbetriq.  [employer] said they sent prior auth to [place].

## 2015-10-31 NOTE — Telephone Encounter (Signed)
More samples were given until PA can be completed.

## 2015-11-06 ENCOUNTER — Ambulatory Visit
Admission: RE | Admit: 2015-11-06 | Discharge: 2015-11-06 | Disposition: A | Discharge status: Home / Self Care | Payer: Medicare Other | Source: Ambulatory Visit | Attending: Infectious Diseases | Admitting: Infectious Diseases

## 2015-11-06 DIAGNOSIS — R928 Other abnormal and inconclusive findings on diagnostic imaging of breast: Secondary | ICD-10-CM

## 2015-11-06 DIAGNOSIS — N6001 Solitary cyst of right breast: Secondary | ICD-10-CM | POA: Diagnosis not present

## 2015-11-06 DIAGNOSIS — N6310 Unspecified lump in the right breast, unspecified quadrant: Secondary | ICD-10-CM

## 2015-11-06 DIAGNOSIS — N6320 Unspecified lump in the left breast, unspecified quadrant: Secondary | ICD-10-CM

## 2015-11-06 DIAGNOSIS — N6321 Unspecified lump in the left breast, upper outer quadrant: Secondary | ICD-10-CM | POA: Insufficient documentation

## 2015-11-08 ENCOUNTER — Telehealth: Payer: Self-pay

## 2015-11-08 ENCOUNTER — Other Ambulatory Visit: Payer: Self-pay | Admitting: Infectious Diseases

## 2015-11-08 DIAGNOSIS — R928 Other abnormal and inconclusive findings on diagnostic imaging of breast: Secondary | ICD-10-CM

## 2015-11-08 DIAGNOSIS — N6320 Unspecified lump in the left breast, unspecified quadrant: Secondary | ICD-10-CM

## 2015-11-08 NOTE — Telephone Encounter (Signed)
PA for myrbetriq has been DENIED due to pt not having tried and failed other medications.

## 2015-11-22 ENCOUNTER — Ambulatory Visit
Admission: RE | Admit: 2015-11-22 | Discharge: 2015-11-22 | Disposition: A | Discharge status: Home / Self Care | Payer: Medicare Other | Source: Ambulatory Visit | Attending: Infectious Diseases | Admitting: Infectious Diseases

## 2015-11-22 DIAGNOSIS — C50412 Malignant neoplasm of upper-outer quadrant of left female breast: Secondary | ICD-10-CM | POA: Diagnosis not present

## 2015-11-22 DIAGNOSIS — Z170 Estrogen receptor positive status [ER+]: Secondary | ICD-10-CM | POA: Diagnosis not present

## 2015-11-22 DIAGNOSIS — N6320 Unspecified lump in the left breast, unspecified quadrant: Secondary | ICD-10-CM

## 2015-11-22 DIAGNOSIS — R928 Other abnormal and inconclusive findings on diagnostic imaging of breast: Secondary | ICD-10-CM

## 2015-11-22 HISTORY — PX: BREAST BIOPSY: SHX20

## 2015-11-28 ENCOUNTER — Other Ambulatory Visit: Payer: Self-pay | Admitting: Surgery

## 2015-11-28 DIAGNOSIS — C50412 Malignant neoplasm of upper-outer quadrant of left female breast: Secondary | ICD-10-CM

## 2015-11-29 ENCOUNTER — Other Ambulatory Visit: Payer: Self-pay | Admitting: Surgery

## 2015-11-29 DIAGNOSIS — C50412 Malignant neoplasm of upper-outer quadrant of left female breast: Secondary | ICD-10-CM

## 2015-11-29 HISTORY — PX: BREAST LUMPECTOMY: SHX2

## 2015-11-29 LAB — SURGICAL PATHOLOGY

## 2015-12-01 ENCOUNTER — Encounter
Admission: RE | Admit: 2015-12-01 | Discharge: 2015-12-01 | Disposition: A | Discharge status: Home / Self Care | Payer: Medicare Other | Source: Ambulatory Visit | Attending: Surgery | Admitting: Surgery

## 2015-12-01 DIAGNOSIS — I10 Essential (primary) hypertension: Secondary | ICD-10-CM | POA: Insufficient documentation

## 2015-12-01 DIAGNOSIS — Z01810 Encounter for preprocedural cardiovascular examination: Secondary | ICD-10-CM | POA: Diagnosis not present

## 2015-12-01 DIAGNOSIS — Z01812 Encounter for preprocedural laboratory examination: Secondary | ICD-10-CM | POA: Insufficient documentation

## 2015-12-01 HISTORY — DX: Anemia, unspecified: D64.9

## 2015-12-01 HISTORY — DX: Diverticulosis of intestine, part unspecified, without perforation or abscess without bleeding: K57.90

## 2015-12-01 HISTORY — DX: Malignant (primary) neoplasm, unspecified: C80.1

## 2015-12-01 HISTORY — DX: Unspecified osteoarthritis, unspecified site: M19.90

## 2015-12-01 HISTORY — DX: Essential (primary) hypertension: I10

## 2015-12-01 HISTORY — DX: Deficiency of other specified B group vitamins: E53.8

## 2015-12-01 LAB — DIFFERENTIAL
BASOS ABS: 0.1 10*3/uL (ref 0–0.1)
BASOS PCT: 1 %
EOS ABS: 0.2 10*3/uL (ref 0–0.7)
Eosinophils Relative: 3 %
Lymphocytes Relative: 38 %
Lymphs Abs: 2.9 10*3/uL (ref 1.0–3.6)
Monocytes Absolute: 1 10*3/uL — ABNORMAL HIGH (ref 0.2–0.9)
Monocytes Relative: 13 %
NEUTROS PCT: 45 %
Neutro Abs: 3.5 10*3/uL (ref 1.4–6.5)

## 2015-12-01 LAB — COMPREHENSIVE METABOLIC PANEL
ALBUMIN: 3.9 g/dL (ref 3.5–5.0)
ALT: <5 U/L — ABNORMAL LOW (ref 14–54)
AST: 13 U/L — AB (ref 15–41)
Alkaline Phosphatase: 63 U/L (ref 38–126)
Anion gap: 7 (ref 5–15)
BUN: 23 mg/dL — AB (ref 6–20)
CHLORIDE: 104 mmol/L (ref 101–111)
CO2: 29 mmol/L (ref 22–32)
Calcium: 9.1 mg/dL (ref 8.9–10.3)
Creatinine, Ser: 0.6 mg/dL (ref 0.44–1.00)
GFR calc Af Amer: >60 mL/min (ref >60)
GLUCOSE: 94 mg/dL (ref 65–99)
POTASSIUM: 4.5 mmol/L (ref 3.5–5.1)
SODIUM: 140 mmol/L (ref 135–145)
Total Bilirubin: 0.2 mg/dL — ABNORMAL LOW (ref 0.3–1.2)
Total Protein: 7.3 g/dL (ref 6.5–8.1)

## 2015-12-01 LAB — CBC
HCT: 39.8 % (ref 35.0–47.0)
Hemoglobin: 13 g/dL (ref 12.0–16.0)
MCH: 28.6 pg (ref 26.0–34.0)
MCHC: 32.6 g/dL (ref 32.0–36.0)
MCV: 87.7 fL (ref 80.0–100.0)
Platelets: 282 10*3/uL (ref 150–440)
RBC: 4.54 MIL/uL (ref 3.80–5.20)
RDW: 15.1 % — AB (ref 11.5–14.5)
WBC: 7.6 10*3/uL (ref 3.6–11.0)

## 2015-12-01 NOTE — Patient Instructions (Signed)
  Your procedure is scheduled on: December 08, 2015 (Friday) Report to [facility] ARRIVAL TIME 8:00 AM  Remember: Instructions that are not followed completely may result in serious medical risk, up to and including death, or upon the discretion of your surgeon and anesthesiologist your surgery may need to be rescheduled.    _x___ 1. Do not eat food or drink liquids after midnight. No gum chewing or hard candies.     __x__ 2. No Alcohol for 24 hours before or after surgery.   __x__3. No Smoking for 24 prior to surgery.   ____  4. Bring all medications with you on the day of surgery if instructed.    __x__ 5. Notify your doctor if there is any change in your medical condition     (cold, fever, infections).     Do not wear jewelry, make-up, hairpins, clips or nail polish.  Do not wear lotions, powders, or perfumes. You may wear deodorant.  Do not shave 48 hours prior to surgery. Men may shave face and neck.  Do not bring valuables to the hospital.    [facility] is not responsible for any belongings or valuables.               Contacts, dentures or bridgework may not be worn into surgery.  Leave your suitcase in the car. After surgery it may be brought to your room.  For patients admitted to the hospital, discharge time is determined by your treatment team.   Patients discharged the day of surgery will not be allowed to drive home.    Please read over the following fact sheets that you were given:   [facility] Preparing for Surgery and or MRSA Information   _x___ Take these medicines the morning of surgery with A SIP OF WATER:    1. Metoprolol  2. Carbidopa-Levodopa  3.  4.  5.  6.  ____Fleets enema or Magnesium Citrate as directed.   __ Use CHG Soap or sage wipes as directed on instruction sheet   ____ Use inhalers on the day of surgery and bring to hospital day of surgery  ____ Stop metformin 2 days prior to surgery    ____ Take 1/2 of usual insulin dose  the night before surgery and none on the morning of           surgery.   __x__ Stop aspirin or coumadin, or plavix (NO ASPIRIN)  x__ Stop Anti-inflammatories such as Advil, Aleve, Ibuprofen, Motrin, Naproxen,          Naprosyn, Goodies powders or aspirin products. Ok to take Tylenol.   ____ Stop supplements until after surgery.    ____ Bring C-Pap to the hospital.

## 2015-12-03 NOTE — Progress Notes (Signed)
[facility] day:  12/04/2015  Chief Complaint: Laura Cisneros is a 82 y.o. female with clinical stage I left breast cancer who is referred in consultation for assessment and management.  HPI: The patient undergoes regular screening mammograms.  She has had previous breast biopsies.  Left breast biopsy at the 3 o'clock position on 11/13/2012 was benign with a complex sclerosing lesion and epithelium associated microcalcifications. There was no atypia or malignancy.  She underwent excisional biopsy of a left breast mass on 12/08/2012 by Dr. Rochel Brome.  Pathology revealed a 1.2 cm complex sclerosing lesion to include sclerosing intraductal papilloma and columnar cell lesion with microcalcifications.  She underwent screening mammogram on 10/19/2015. Further evaluation was suggested for possible mass in the right breast as well as distortion and possible mass in the left breast.  Bilateral diagnostic mammogram on 11/06/2015 revealed a suspicious 5 mm mass in the upper outer left breast.  There was a benign cyst in the right breast.  Left breast biopsy at the 2 o'clock position on 11/22/2015 revealed a 6 mm core sample of invasive mammary carcinoma of no special type with focal microcalcifications. Preliminary grade was 1. Tumor was ER positive (> 90%), PR negative, and HER-2/neu 2+ (negative by FISH).  She is scheduled to undergo partial mastectomy and sentinel lymph node biopsy on 12/08/2015 by Dr. Rochel Brome.  She has a history of "off and on" iron deficiency.  She notes 2 prior colonoscopies. She has been on B12 shots for years. She has osteoporosis and was previously on Zometa for 5 years. She had a partial hysterectomy. Ovaries remain in place.  Symptomatically, she denies any concerns.  She has had Parkinson's disease for 20 years.  Gait is unstable.   Past Medical History:  Diagnosis Date  . Anemia   . Arthritis    osteoarthritis  . Atrophic  vaginitis   . Cancer ([place])    Breast  . Cataract   . Diverticulosis   . Hiatal hernia   . Hypertension   . Incontinence   . Osteoporosis   . Parkinson disease ([place])   . Vitamin B12 deficiency     Past Surgical History:  Procedure Laterality Date  . ABDOMINAL HYSTERECTOMY    . ABDOMINAL HYSTERECTOMY    . BREAST BIOPSY Left 10/2012   benign  . BREAST BIOPSY Left 11/22/2015   invasive mammary ca   . BREAST EXCISIONAL BIOPSY Left 12/08/2015   lumpectomy  . EYE SURGERY Bilateral    Catarct Extraction with IOL  . FOOT FRACTURE SURGERY Right    pinning, Dr. Francia Greaves, [facility]  . INCONTINENCE SURGERY    . PARTIAL MASTECTOMY WITH AXILLARY SENTINEL LYMPH NODE BIOPSY Left 12/08/2015   Procedure: PARTIAL MASTECTOMY WITH AXILLARY SENTINEL LYMPH NODE BIOPSY;  Surgeon: [name], MD;  Location: [facility] ORS;  Service: General;  Laterality: Left;    Family History  Problem Relation Age of Onset  . Kidney disease Neg Hx   . Bladder Cancer Neg Hx   . Breast cancer Neg Hx     Social History:  reports that she has never smoked. She has never used smokeless tobacco. She reports that she does not drink alcohol or use drugs.  She denies any exposure to radiation or toxins.  She previously worked in a [employer].  The patient is accompanied by her son, Laura Cisneros, and grand daughter, Laura Cisneros, today.  Allergies: No Known Allergies  Current Medications: Current Outpatient Prescriptions  Medication Sig Dispense Refill  . acetaminophen (TYLENOL) 500 MG tablet Take 500 mg by mouth every 6 (six) hours as needed (pain).    . carbidopa-levodopa (SINEMET CR) 50-200 MG tablet Take 3 tablets by mouth 2 (two) times daily.    Marland Kitchen conjugated estrogens (PREMARIN) vaginal cream Place 1 Applicatorful vaginally daily. Apply 0.'5mg'$  (pea-sized amount)  just inside the vaginal introitus with a finger-tip every night for two weeks and then Monday, Wednesday and Friday nights. (Patient taking differently: Place 1 g vaginally every  Monday, Wednesday, and Friday. ) 30 g 12  . cyanocobalamin (,VITAMIN B-12,) 1000 MCG/ML injection INJECT 1ML INTO MUSCLE ONCE A MONTH AS DIRECTED    . hydrochlorothiazide (MICROZIDE) 12.5 MG capsule Take 12.5 mg by mouth daily.    . hydroxypropyl methylcellulose / hypromellose (ISOPTO TEARS / GONIOVISC) 2.5 % ophthalmic solution Place 1 drop into both eyes 3 (three) times daily as needed for dry eyes.    . metoprolol succinate (TOPROL-XL) 50 MG 24 hr tablet Take 50 mg by mouth daily.     . mirabegron ER (MYRBETRIQ) 25 MG TB24 tablet Take 1 tablet (25 mg total) by mouth daily. 90 tablet 4  . HYDROcodone-acetaminophen (NORCO) 5-325 MG tablet Take 1 tablet by mouth every 4 (four) hours as needed for moderate pain. 12 tablet 0   No current facility-administered medications for this visit.     Review of Systems:  GENERAL:  Feels "fine".  No fevers, sweats or weight loss. PERFORMANCE STATUS (ECOG):  1-2 HEENT:  Intermittent hoarseness.  No visual changes, runny nose, sore throat, mouth sores or tenderness. Lungs: No shortness of breath or cough.  No hemoptysis. Cardiac:  No chest pain, palpitations, orthopnea, or PND. GI:  Diarrhea, depends on what she eats.  No nausea, vomiting, constipation, melena or hematochezia. GU:  Frequent UTIs.  No urgency, frequency, dysuria, or hematuria. Musculoskeletal:  No back pain.  No joint pain.  No muscle tenderness. Took Zometa x 5 years. Extremities:  No pain or swelling. Skin:  Hands bruise easily.  No rashes or skin changes. Neuro:  Parkinson's disease.  No headache, numbness or weakness,or coordination issues.  Gait is unstable. Endocrine:  No diabetes, thyroid issues, hot flashes or night sweats. Psych:  No mood changes, depression or anxiety. Pain:  No focal pain. Review of systems:  All other systems reviewed and found to be negative.  Physical Exam: Blood pressure 108/70, pulse 60, temperature (!) 95.6 F (35.3 C), resp. rate 18, height '4\' 11"'$   (1.499 m), weight 143 lb 8.3 oz (65.1 kg). GENERAL:  Thin elderly woman sitting comfortably in the exam room in no acute distress. MENTAL STATUS:  Alert and oriented to person, place and time. HEAD:  Curly gray hair.  Normocephalic, atraumatic, face symmetric, no Cushingoid features. EYES:  Blue eyes.  Pupils equal round and reactive to light and accomodation.  No conjunctivitis or scleral icterus. ENT:  Oropharynx clear without lesion.  Dentures.  Tongue normal. Mucous membranes moist.  RESPIRATORY:  Clear to auscultation without rales, wheezes or rhonchi. CARDIOVASCULAR:  Regular rate and rhythm without murmur, rub or gallop. BREAST:  Right breast without masses, skin changes or nipple discharge.  Left breast with approximately 1 cm mass at the 3 o'clock position. No skin changes or nipple discharge.  ABDOMEN:  Soft, non-tender, with active bowel sounds, and no hepatosplenomegaly.  No masses. SKIN:  No rashes, ulcers or lesions. EXTREMITIES: No edema, no skin discoloration or tenderness.  No palpable cords. LYMPH  NODES: No palpable cervical, supraclavicular, axillary or inguinal adenopathy  NEUROLOGICAL: Tremor. PSYCH:  Appropriate.   Appointment on 12/04/2015  Component Date Value Ref Range Status  . WBC 12/04/2015 8.1  3.6 - 11.0 K/uL Final  . RBC 12/04/2015 4.42  3.80 - 5.20 MIL/uL Final  . Hemoglobin 12/04/2015 13.1  12.0 - 16.0 g/dL Final  . HCT 12/04/2015 38.3  35.0 - 47.0 % Final  . MCV 12/04/2015 86.6  80.0 - 100.0 fL Final  . MCH 12/04/2015 29.5  26.0 - 34.0 pg Final  . MCHC 12/04/2015 34.1  32.0 - 36.0 g/dL Final  . RDW 12/04/2015 15.0* 11.5 - 14.5 % Final  . Platelets 12/04/2015 257  150 - 440 K/uL Final  . Neutrophils Relative % 12/04/2015 55  % Final  . Neutro Abs 12/04/2015 4.5  1.4 - 6.5 K/uL Final  . Lymphocytes Relative 12/04/2015 30  % Final  . Lymphs Abs 12/04/2015 2.4  1.0 - 3.6 K/uL Final  . Monocytes Relative 12/04/2015 12  % Final  . Monocytes Absolute  12/04/2015 1.0* 0.2 - 0.9 K/uL Final  . Eosinophils Relative 12/04/2015 2  % Final  . Eosinophils Absolute 12/04/2015 0.1  0 - 0.7 K/uL Final  . Basophils Relative 12/04/2015 1  % Final  . Basophils Absolute 12/04/2015 0.1  0 - 0.1 K/uL Final  . Sodium 12/04/2015 137  135 - 145 mmol/L Final  . Potassium 12/04/2015 3.7  3.5 - 5.1 mmol/L Final  . Chloride 12/04/2015 103  101 - 111 mmol/L Final  . CO2 12/04/2015 27  22 - 32 mmol/L Final  . Glucose, Bld 12/04/2015 93  65 - 99 mg/dL Final  . BUN 12/04/2015 19  6 - 20 mg/dL Final  . Creatinine, Ser 12/04/2015 0.62  0.44 - 1.00 mg/dL Final  . Calcium 12/04/2015 9.1  8.9 - 10.3 mg/dL Final  . Total Protein 12/04/2015 7.4  6.5 - 8.1 g/dL Final  . Albumin 12/04/2015 3.9  3.5 - 5.0 g/dL Final  . AST 12/04/2015 13* 15 - 41 U/L Final  . ALT 12/04/2015 <5* 14 - 54 U/L Final  . Alkaline Phosphatase 12/04/2015 73  38 - 126 U/L Final  . Total Bilirubin 12/04/2015 0.4  0.3 - 1.2 mg/dL Final  . GFR calc non Af Amer 12/04/2015 >60  >60 mL/min Final  . GFR calc Af Amer 12/04/2015 >60  >60 mL/min Final   Comment: (NOTE) The eGFR has been calculated using the CKD EPI equation. This calculation has not been validated in all clinical situations. eGFR's persistently <60 mL/min signify possible Chronic Kidney Disease.   . Anion gap 12/04/2015 7  5 - 15 Final  . CA 27.29 12/05/2015 27.6  0.0 - 38.6 U/mL Final   Comment: (NOTE) Bayer Centaur/ACS methodology Performed At: [facility] [address] [place], [place] [id number] Lindon Romp MD LZ:[id number]     Assessment:  Laura Cisneros is a 82 y.o. female with clinical stage I left breast cancer s/p biopsy on 11/22/2015. Pathology revealed a 6 mm core sample of invasive mammary carcinoma of no special type with focal microcalcifications. Preliminary grade was I. Tumor was ER positive (> 90%), PR negative, and HER-2/neu 2+ (negative by FISH).  Bilateral diagnostic mammogram on 11/06/2015 revealed  a suspicious 5 mm mass in the upper outer left breast. She is scheduled to undergo partial mastectomy and sentinel lymph node biopsy on 12/08/2015.  She has osteoporosis and was previously on Zometa for 5 years.  She has Parkinson's disease.  Gait is unstable.  She has a history of iron deficiency.  She notes 2 colonoscopies in the past. She has been on B12 shots for years.   Symptomatically, she denies any concerns.  Exam reveals a small palpable left breast mass.  Plan: 1.  Discuss diagnosis, staging, and management of breast cancer.  Discuss clinical stage I disease.  Discuss plan for partial mastectomy and sentinel lymph node biopsy.  Discuss possible radiation based on pathology (will discuss with Dr. Baruch Gouty after final pathology returns).  Discuss hormonal therapy (tamoxifen versus aromatase inhibitor) for 5 years.  Side effects reviewed in detail.   2.  Discuss obtaining bone density study.  Discuss prior osteoporosis and treatment.  Discuss consideration of Prolia.  Discuss issues with hormonal therapy. 3.  Labs today:  CBC with diff, CMP, CA27.29. 4.  Bone density study. 5.  Anticipate partial mastectomy on 12/08/2015. 6.  RTC on 12/15/2015 for MD assessment, review of testing and final pathology.   Lequita Asal, MD  12/04/2015

## 2015-12-04 ENCOUNTER — Encounter: Payer: Self-pay | Admitting: Infectious Diseases

## 2015-12-04 ENCOUNTER — Inpatient Hospital Stay: Payer: Medicare Other | Attending: Hematology and Oncology | Admitting: Hematology and Oncology

## 2015-12-04 ENCOUNTER — Inpatient Hospital Stay: Payer: Medicare Other

## 2015-12-04 ENCOUNTER — Encounter: Payer: Self-pay | Admitting: Hematology and Oncology

## 2015-12-04 VITALS — BP 108/70 | HR 60 | Temp 95.6°F | Resp 18 | Ht 59.0 in | Wt 143.5 lb

## 2015-12-04 DIAGNOSIS — C50412 Malignant neoplasm of upper-outer quadrant of left female breast: Secondary | ICD-10-CM | POA: Insufficient documentation

## 2015-12-04 DIAGNOSIS — Z79899 Other long term (current) drug therapy: Secondary | ICD-10-CM | POA: Diagnosis not present

## 2015-12-04 DIAGNOSIS — M810 Age-related osteoporosis without current pathological fracture: Secondary | ICD-10-CM | POA: Insufficient documentation

## 2015-12-04 DIAGNOSIS — I10 Essential (primary) hypertension: Secondary | ICD-10-CM | POA: Diagnosis not present

## 2015-12-04 DIAGNOSIS — D509 Iron deficiency anemia, unspecified: Secondary | ICD-10-CM

## 2015-12-04 DIAGNOSIS — Z90710 Acquired absence of both cervix and uterus: Secondary | ICD-10-CM | POA: Insufficient documentation

## 2015-12-04 DIAGNOSIS — R490 Dysphonia: Secondary | ICD-10-CM | POA: Diagnosis not present

## 2015-12-04 DIAGNOSIS — G20 Parkinson's disease: Secondary | ICD-10-CM | POA: Insufficient documentation

## 2015-12-04 DIAGNOSIS — Z170 Estrogen receptor positive status [ER+]: Principal | ICD-10-CM

## 2015-12-04 DIAGNOSIS — Z9889 Other specified postprocedural states: Secondary | ICD-10-CM | POA: Diagnosis not present

## 2015-12-04 DIAGNOSIS — Z87440 Personal history of urinary (tract) infections: Secondary | ICD-10-CM | POA: Diagnosis not present

## 2015-12-04 DIAGNOSIS — M1990 Unspecified osteoarthritis, unspecified site: Secondary | ICD-10-CM | POA: Diagnosis not present

## 2015-12-04 LAB — CBC WITH DIFFERENTIAL/PLATELET
Basophils Absolute: 0.1 10*3/uL (ref 0–0.1)
Basophils Relative: 1 %
Eosinophils Absolute: 0.1 10*3/uL (ref 0–0.7)
Eosinophils Relative: 2 %
HCT: 38.3 % (ref 35.0–47.0)
Hemoglobin: 13.1 g/dL (ref 12.0–16.0)
Lymphocytes Relative: 30 %
Lymphs Abs: 2.4 10*3/uL (ref 1.0–3.6)
MCH: 29.5 pg (ref 26.0–34.0)
MCHC: 34.1 g/dL (ref 32.0–36.0)
MCV: 86.6 fL (ref 80.0–100.0)
Monocytes Absolute: 1 10*3/uL — ABNORMAL HIGH (ref 0.2–0.9)
Monocytes Relative: 12 %
Neutro Abs: 4.5 10*3/uL (ref 1.4–6.5)
Neutrophils Relative %: 55 %
Platelets: 257 10*3/uL (ref 150–440)
RBC: 4.42 MIL/uL (ref 3.80–5.20)
RDW: 15 % — ABNORMAL HIGH (ref 11.5–14.5)
WBC: 8.1 10*3/uL (ref 3.6–11.0)

## 2015-12-04 LAB — COMPREHENSIVE METABOLIC PANEL
ALT: <5 U/L — ABNORMAL LOW (ref 14–54)
AST: 13 U/L — ABNORMAL LOW (ref 15–41)
Albumin: 3.9 g/dL (ref 3.5–5.0)
Alkaline Phosphatase: 73 U/L (ref 38–126)
Anion gap: 7 (ref 5–15)
BUN: 19 mg/dL (ref 6–20)
CO2: 27 mmol/L (ref 22–32)
Calcium: 9.1 mg/dL (ref 8.9–10.3)
Chloride: 103 mmol/L (ref 101–111)
Creatinine, Ser: 0.62 mg/dL (ref 0.44–1.00)
GFR calc Af Amer: >60 mL/min (ref >60)
GFR calc non Af Amer: >60 mL/min (ref >60)
Glucose, Bld: 93 mg/dL (ref 65–99)
Potassium: 3.7 mmol/L (ref 3.5–5.1)
Sodium: 137 mmol/L (ref 135–145)
Total Bilirubin: 0.4 mg/dL (ref 0.3–1.2)
Total Protein: 7.4 g/dL (ref 6.5–8.1)

## 2015-12-04 NOTE — Progress Notes (Signed)
Patient here today as new evaluation regarding breast cancer.  Referred by Koren Shiver, Nurse Navigator.

## 2015-12-05 ENCOUNTER — Ambulatory Visit
Admission: RE | Admit: 2015-12-05 | Discharge: 2015-12-05 | Disposition: A | Discharge status: Home / Self Care | Payer: Medicare Other | Source: Ambulatory Visit | Attending: Hematology and Oncology | Admitting: Hematology and Oncology

## 2015-12-05 DIAGNOSIS — Z853 Personal history of malignant neoplasm of breast: Secondary | ICD-10-CM | POA: Diagnosis not present

## 2015-12-05 DIAGNOSIS — Z13820 Encounter for screening for osteoporosis: Secondary | ICD-10-CM | POA: Insufficient documentation

## 2015-12-05 DIAGNOSIS — M810 Age-related osteoporosis without current pathological fracture: Secondary | ICD-10-CM | POA: Insufficient documentation

## 2015-12-05 DIAGNOSIS — Z780 Asymptomatic menopausal state: Secondary | ICD-10-CM | POA: Insufficient documentation

## 2015-12-05 LAB — CANCER ANTIGEN 27.29: CA 27.29: 27.6 U/mL (ref 0.0–38.6)

## 2015-12-07 ENCOUNTER — Telehealth: Payer: Self-pay | Admitting: *Deleted

## 2015-12-07 NOTE — Telephone Encounter (Signed)
Called patient's home and spoke to son, [name].  Gave him results of bone density - osteoporosis.  Per MD asked if patient is interested in getting prolia injections here at the cancer center (MD discusses with them at appointment earlier this week).  Son states patient is in agreement with Prolia.  Will notify MD to get it ordered.

## 2015-12-07 NOTE — Telephone Encounter (Signed)
-----   Message from Lequita Asal, MD sent at 12/07/2015  2:38 PM EST ----- Regarding: Please call patient  Osteoporosis on bone density  Ensure taking calcium and vitamin D  Ask if interested in Prolia (we discussed) If so, I will put in orders.  M  ----- Message ----- From: Interface, Rad Results In Sent: 12/07/2015   1:18 PM To: Lequita Asal, MD

## 2015-12-08 ENCOUNTER — Encounter
Admission: RE | Admit: 2015-12-08 | Discharge: 2015-12-08 | Disposition: A | Discharge status: Home / Self Care | Payer: Medicare Other | Source: Ambulatory Visit | Attending: Surgery | Admitting: Surgery

## 2015-12-08 ENCOUNTER — Encounter: Payer: Self-pay | Admitting: *Deleted

## 2015-12-08 ENCOUNTER — Ambulatory Visit: Payer: Medicare Other | Admitting: Anesthesiology

## 2015-12-08 ENCOUNTER — Ambulatory Visit
Admission: RE | Admit: 2015-12-08 | Discharge: 2015-12-08 | Disposition: A | Discharge status: Home / Self Care | Payer: Medicare Other | Source: Ambulatory Visit | Attending: Surgery | Admitting: Surgery

## 2015-12-08 ENCOUNTER — Encounter
Admission: RE | Disposition: A | Discharge status: Home / Self Care | Payer: Self-pay | Source: Ambulatory Visit | Attending: Surgery

## 2015-12-08 DIAGNOSIS — C50412 Malignant neoplasm of upper-outer quadrant of left female breast: Secondary | ICD-10-CM | POA: Diagnosis present

## 2015-12-08 DIAGNOSIS — I10 Essential (primary) hypertension: Secondary | ICD-10-CM | POA: Insufficient documentation

## 2015-12-08 DIAGNOSIS — K449 Diaphragmatic hernia without obstruction or gangrene: Secondary | ICD-10-CM | POA: Insufficient documentation

## 2015-12-08 DIAGNOSIS — D649 Anemia, unspecified: Secondary | ICD-10-CM | POA: Insufficient documentation

## 2015-12-08 HISTORY — PX: PARTIAL MASTECTOMY WITH AXILLARY SENTINEL LYMPH NODE BIOPSY: SHX6004

## 2015-12-08 HISTORY — PX: BREAST EXCISIONAL BIOPSY: SUR124

## 2015-12-08 SURGERY — PARTIAL MASTECTOMY WITH AXILLARY SENTINEL LYMPH NODE BIOPSY
Anesthesia: General | Site: Breast | Laterality: Left | Wound class: Clean

## 2015-12-08 MED ORDER — FAMOTIDINE 20 MG PO TABS
20.0000 mg | ORAL_TABLET | Freq: Once | ORAL | Status: AC
  Administered 2015-12-08: 20 mg via ORAL

## 2015-12-08 MED ORDER — ONDANSETRON HCL 4 MG/2ML IJ SOLN: 4.0000 mg | Freq: Once | INTRAMUSCULAR | Status: DC | PRN

## 2015-12-08 MED ORDER — EPHEDRINE SULFATE 50 MG/ML IJ SOLN
INTRAMUSCULAR | Status: DC | PRN
  Administered 2015-12-08 (×2): 10 mg via INTRAVENOUS
  Administered 2015-12-08: 5 mg via INTRAVENOUS
  Administered 2015-12-08: 10 mg via INTRAVENOUS

## 2015-12-08 MED ORDER — LIDOCAINE HCL (CARDIAC) 20 MG/ML IV SOLN
INTRAVENOUS | Status: DC | PRN
Start: 2015-12-08 — End: 2015-12-08
  Administered 2015-12-08: 60 mg via INTRAVENOUS

## 2015-12-08 MED ORDER — HYDROCODONE-ACETAMINOPHEN 5-325 MG PO TABS: 1.0000 | ORAL_TABLET | ORAL | Status: DC | PRN

## 2015-12-08 MED ORDER — FENTANYL CITRATE (PF) 100 MCG/2ML IJ SOLN
25.0000 ug | INTRAMUSCULAR | Status: DC | PRN
  Administered 2015-12-08: 25 ug via INTRAVENOUS

## 2015-12-08 MED ORDER — BUPIVACAINE-EPINEPHRINE 0.5% -1:200000 IJ SOLN
INTRAMUSCULAR | Status: DC | PRN
  Administered 2015-12-08: 10 mL

## 2015-12-08 MED ORDER — ACETAMINOPHEN 325 MG PO TABS: 650.0000 mg | ORAL_TABLET | Freq: Once | ORAL | Status: DC

## 2015-12-08 MED ORDER — FAMOTIDINE 20 MG PO TABS
ORAL_TABLET | ORAL | Status: AC
  Filled 2015-12-08: qty 1

## 2015-12-08 MED ORDER — PROPOFOL 10 MG/ML IV BOLUS
INTRAVENOUS | Status: DC | PRN
  Administered 2015-12-08: 80 mg via INTRAVENOUS

## 2015-12-08 MED ORDER — BUPIVACAINE-EPINEPHRINE (PF) 0.5% -1:200000 IJ SOLN
INTRAMUSCULAR | Status: AC
  Filled 2015-12-08: qty 30

## 2015-12-08 MED ORDER — FENTANYL CITRATE (PF) 100 MCG/2ML IJ SOLN
INTRAMUSCULAR | Status: AC
  Administered 2015-12-08: 25 ug via INTRAVENOUS
  Filled 2015-12-08: qty 2

## 2015-12-08 MED ORDER — LACTATED RINGERS IV SOLN
INTRAVENOUS | Status: DC
  Administered 2015-12-08: 10:00:00 via INTRAVENOUS

## 2015-12-08 MED ORDER — FENTANYL CITRATE (PF) 100 MCG/2ML IJ SOLN
INTRAMUSCULAR | Status: DC | PRN
  Administered 2015-12-08 (×3): 25 ug via INTRAVENOUS

## 2015-12-08 MED ORDER — ONDANSETRON HCL 4 MG/2ML IJ SOLN
INTRAMUSCULAR | Status: DC | PRN
  Administered 2015-12-08: 4 mg via INTRAVENOUS

## 2015-12-08 MED ORDER — TECHNETIUM TC 99M SULFUR COLLOID
1.0000 | Freq: Once | INTRAVENOUS | Status: AC | PRN
  Administered 2015-12-08: 1 via INTRAVENOUS

## 2015-12-08 MED ORDER — HYDROCODONE-ACETAMINOPHEN 5-325 MG PO TABS: 1.0000 | ORAL_TABLET | ORAL | 0 refills | Status: DC | PRN

## 2015-12-08 SURGICAL SUPPLY — 26 items
BLADE SURG 15 STRL LF DISP TIS (BLADE) ×1 IMPLANT
BLADE SURG 15 STRL SS (BLADE) ×1
CANISTER SUCT 1200ML W/VALVE (MISCELLANEOUS) ×2 IMPLANT
CHLORAPREP W/TINT 26ML (MISCELLANEOUS) ×2 IMPLANT
DEVICE DUBIN SPECIMEN MAMMOGRA (MISCELLANEOUS) ×2 IMPLANT
DRAPE LAPAROTOMY 77X122 PED (DRAPES) ×2 IMPLANT
ELECT REM PT RETURN 9FT ADLT (ELECTROSURGICAL) ×2
ELECTRODE REM PT RTRN 9FT ADLT (ELECTROSURGICAL) ×1 IMPLANT
GLOVE BIO SURGEON STRL SZ7.5 (GLOVE) ×2 IMPLANT
GOWN STRL REUS W/ TWL LRG LVL3 (GOWN DISPOSABLE) ×2 IMPLANT
GOWN STRL REUS W/TWL LRG LVL3 (GOWN DISPOSABLE) ×2
KIT RM TURNOVER STRD PROC AR (KITS) ×2 IMPLANT
LABEL OR SOLS (LABEL) ×2 IMPLANT
LIQUID BAND (GAUZE/BANDAGES/DRESSINGS) ×2 IMPLANT
MARGIN MAP 10MM (MISCELLANEOUS) ×2 IMPLANT
NEEDLE HYPO 25X1 1.5 SAFETY (NEEDLE) ×2 IMPLANT
PACK BASIN MINOR ARMC (MISCELLANEOUS) ×2 IMPLANT
SLEVE PROBE SENORX GAMMA FIND (MISCELLANEOUS) ×2 IMPLANT
SUT CHROMIC 4 0 RB 1X27 (SUTURE) ×2 IMPLANT
SUT ETHILON 3-0 FS-10 30 BLK (SUTURE) ×2
SUT MNCRL 4-0 (SUTURE) ×1
SUT MNCRL 4-0 27XMFL (SUTURE) ×1
SUTURE EHLN 3-0 FS-10 30 BLK (SUTURE) ×1 IMPLANT
SUTURE MNCRL 4-0 27XMF (SUTURE) ×1 IMPLANT
SYRINGE 10CC LL (SYRINGE) ×2 IMPLANT
WATER STERILE IRR 1000ML POUR (IV SOLUTION) ×2 IMPLANT

## 2015-12-08 NOTE — Discharge Instructions (Addendum)
Take Tylenol or Norco if needed for pain.  Gradually resume usual activities as tolerated.   AMBULATORY SURGERY  DISCHARGE INSTRUCTIONS   1) The drugs that you were given will stay in your system until tomorrow so for the next 24 hours you should not:  A) Drive an automobile B) Make any legal decisions C) Drink any alcoholic beverage   2) You may resume regular meals tomorrow.  Today it is better to start with liquids and gradually work up to solid foods.  You may eat anything you prefer, but it is better to start with liquids, then soup and crackers, and gradually work up to solid foods.   3) Please notify your doctor immediately if you have any unusual bleeding, trouble breathing, redness and pain at the surgery site, drainage, fever, or pain not relieved by medication.    4) Additional Instructions:        Please contact your physician with any problems or Same Day Surgery at [phone], Monday through Friday 6 am to 4 pm, or [place] [facility] number at [phone].

## 2015-12-08 NOTE — Anesthesia Procedure Notes (Signed)
Procedure Name: LMA Insertion Date/Time: 12/08/2015 11:45 AM Performed by: Doreen Salvage Pre-anesthesia Checklist: Patient identified, Patient being monitored, Timeout performed, Emergency Drugs available and Suction available Patient Re-evaluated:Patient Re-evaluated prior to inductionOxygen Delivery Method: Circle system utilized Preoxygenation: Pre-oxygenation with 100% oxygen Intubation Type: IV induction Ventilation: Mask ventilation without difficulty LMA: LMA inserted LMA Size: 3.0 Tube type: Oral Number of attempts: 1 Placement Confirmation: positive ETCO2 and breath sounds checked- equal and bilateral Tube secured with: Tape Dental Injury: Teeth and Oropharynx as per pre-operative assessment

## 2015-12-08 NOTE — H&P (Signed)
  She reports no change in condition since the day of the office exam.  She has had x-ray needle localization. I have reviewed the mammogram images. She is also had injection of radioactive technetium sulfur colloid.  The left side was marked YES  I discussed the plan for left partial mastectomy and sentinel lymph node biopsy

## 2015-12-08 NOTE — Op Note (Signed)
OPERATIVE REPORT  PREOPERATIVE  DIAGNOSIS: . Left breast cancer  POSTOPERATIVE DIAGNOSIS: . Left breast cancer  PROCEDURE: . Left partial mastectomy with axillary sentinel lymph node biopsy  ANESTHESIA:  General  SURGEON: Rochel Brome  MD   INDICATIONS: . She had recent mammogram depicting a density with some calcifications in the upper outer quadrant of the left breast. Needle biopsy demonstrated invasive mammary carcinoma. She had preoperative insertion of a Kopan's wire. Her mammogram images were reviewed. She also had injection of radioactive technetium sulfur colloid.  The patient was placed on the operating table in the supine position under general anesthesia. The left arm was placed on a lateral arm support. The dressing was removed from the lateral aspect of the left breast. The Kopan's wire was cut 2 cm from the skin. The site was prepared with ChloraPrep and draped in a sterile manner.  A curvilinear incision was made in the peripheral aspect of the left breast from 2:00 to 3:30 position removing and a narrow ellipse of skin surrounding the Kopan's wire. The dissection was carried down through subcutaneous tissues and around the mass including some of the glandular tissue of the breast. The mass of tissue was marked with margin maps suturing the markers to the cranial caudal medial lateral and deep margins.. A nylon stitch was placed at the 2:30 position of the skin ellipse. The specimen was submitted for specimen mammogram and for pathology. The radiologist did called to report that the biopsy marker and the calcifications and the mass was seen within the specimen mammogram. The pathologist called back to indicate that the margins appeared clear.  The axilla was probed with a gamma counter demonstrating the location of radioactivity in the inferior aspect of the axilla. An oblique incision was made in the inferior aspect of the axilla and carried down through subcutaneous tissues  through superficial fascia finding radioactivity. With further dissection a lymph node was found at the adjacent to the rib cage. This lymph node was dissected free from surrounding structures. Its vascular pedicle suture ligated with 4-0 chromic and the lymph node was excised. The lymph node was approximated 7 mm in dimension. The ex vivo count was in the range of 1:30 to 160. The background count was less than 10 the lymph node was submitted for routine pathology. There was no remaining palpable mass found within the axilla.  Both wounds were inspected and hemostasis appeared to be intact. Subcutaneous tissues were infiltrated with half percent Sensorcaine with epinephrine. Subcutaneous tissues of the partial mastectomy wound were approximated with interrupted 4-0 chromic. Both wounds were closed with running 4-0 Monocryl subcuticular suture and Dermabond.  The patient appeared to tolerate the procedure satisfactorily and was prepared for transfer to the recovery room  [facility].D.

## 2015-12-08 NOTE — Anesthesia Postprocedure Evaluation (Signed)
Anesthesia Post Note  Patient: Laura Cisneros  Procedure(s) Performed: Procedure(s) (LRB): PARTIAL MASTECTOMY WITH AXILLARY SENTINEL LYMPH NODE BIOPSY (Left)  Patient location during evaluation: PACU Anesthesia Type: General Level of consciousness: awake and alert Pain management: pain level controlled Vital Signs Assessment: post-procedure vital signs reviewed and stable Respiratory status: spontaneous breathing and respiratory function stable Cardiovascular status: stable Anesthetic complications: no    Last Vitals:  Vitals:   12/08/15 1450 12/08/15 1505  BP: 131/65 137/66  Pulse: (!) 55 61  Resp: 17 14  Temp:  36.2 C    Last Pain:  Vitals:   12/08/15 1505  TempSrc:   PainSc: 2                  KEPHART,WILLIAM K

## 2015-12-08 NOTE — Anesthesia Preprocedure Evaluation (Signed)
Anesthesia Evaluation  Patient identified by MRN, date of birth, ID band Patient awake    Reviewed: Allergy & Precautions, NPO status , Patient's Chart, lab work & pertinent test results, reviewed documented beta blocker date and time   History of Anesthesia Complications Negative for: history of anesthetic complications  Airway Mallampati: II       Dental   Pulmonary neg pulmonary ROS,           Cardiovascular hypertension, Pt. on medications and Pt. on home beta blockers      Neuro/Psych  Neuromuscular disease (parkinsons)    GI/Hepatic Neg liver ROS, hiatal hernia,   Endo/Other  negative endocrine ROS  Renal/GU negative Renal ROS     Musculoskeletal   Abdominal   Peds  Hematology  (+) anemia ,   Anesthesia Other Findings   Reproductive/Obstetrics                             Anesthesia Physical Anesthesia Plan  ASA: III  Anesthesia Plan: General   Post-op Pain Management:    Induction: Intravenous  Airway Management Planned: LMA  Additional Equipment:   Intra-op Plan:   Post-operative Plan:   Informed Consent: I have reviewed the patients History and Physical, chart, labs and discussed the procedure including the risks, benefits and alternatives for the proposed anesthesia with the patient or authorized representative who has indicated his/her understanding and acceptance.     Plan Discussed with:   Anesthesia Plan Comments:         Anesthesia Quick Evaluation

## 2015-12-08 NOTE — Transfer of Care (Signed)
Immediate Anesthesia Transfer of Care Note  Patient: Laura Cisneros  Procedure(s) Performed: Procedure(s): PARTIAL MASTECTOMY WITH AXILLARY SENTINEL LYMPH NODE BIOPSY (Left)  Patient Location: PACU  Anesthesia Type:General  Level of Consciousness: sedated  Airway & Oxygen Therapy: Patient Spontanous Breathing and Patient connected to face mask oxygen  Post-op Assessment: Report given to RN and Post -op Vital signs reviewed and stable  Post vital signs: Reviewed and stable  Last Vitals:  Vitals:   12/08/15 1420 12/08/15 1435  BP: 128/69 135/69  Pulse: (!) 59 62  Resp: 16 17  Temp:      Complications: No apparent anesthesia complications

## 2015-12-11 ENCOUNTER — Encounter: Payer: Self-pay | Admitting: Surgery

## 2015-12-11 NOTE — Progress Notes (Signed)
  Oncology Nurse Navigator Documentation  Navigator Location: [facility] (12/11/15 1500)   )Navigator Encounter Type: Telephone (12/11/15 1500) Telephone: Laura Cisneros Call;Appt Confirmation/Clarification (post-op phone call) (12/11/15 1500)     Surgery Date: 12/08/15 (12/11/15 1500)             Patient Visit Type: Follow-up;Surgery (12/11/15 1500)                              Time Spent with Patient: 15 (12/11/15 1500)  Patient doing well post-op per son [name].  Discussed rationale for sentinel node biopsy.  Confirmed follow-up appointments with Dr. Mike Gip, and Dr. Tamala Julian.

## 2015-12-12 LAB — SURGICAL PATHOLOGY

## 2015-12-15 ENCOUNTER — Inpatient Hospital Stay (HOSPITAL_BASED_OUTPATIENT_CLINIC_OR_DEPARTMENT_OTHER): Payer: Medicare Other | Admitting: Hematology and Oncology

## 2015-12-15 ENCOUNTER — Encounter: Payer: Self-pay | Admitting: *Deleted

## 2015-12-15 ENCOUNTER — Encounter: Payer: Self-pay | Admitting: Hematology and Oncology

## 2015-12-15 ENCOUNTER — Other Ambulatory Visit: Payer: Self-pay | Admitting: Hematology and Oncology

## 2015-12-15 VITALS — BP 136/84 | HR 72 | Temp 94.7°F | Resp 18 | Wt 142.4 lb

## 2015-12-15 DIAGNOSIS — Z170 Estrogen receptor positive status [ER+]: Secondary | ICD-10-CM | POA: Diagnosis not present

## 2015-12-15 DIAGNOSIS — Z9889 Other specified postprocedural states: Secondary | ICD-10-CM | POA: Diagnosis not present

## 2015-12-15 DIAGNOSIS — G20 Parkinson's disease: Secondary | ICD-10-CM

## 2015-12-15 DIAGNOSIS — Z87440 Personal history of urinary (tract) infections: Secondary | ICD-10-CM

## 2015-12-15 DIAGNOSIS — Z90710 Acquired absence of both cervix and uterus: Secondary | ICD-10-CM

## 2015-12-15 DIAGNOSIS — R509 Fever, unspecified: Secondary | ICD-10-CM

## 2015-12-15 DIAGNOSIS — M1990 Unspecified osteoarthritis, unspecified site: Secondary | ICD-10-CM

## 2015-12-15 DIAGNOSIS — R490 Dysphonia: Secondary | ICD-10-CM

## 2015-12-15 DIAGNOSIS — M810 Age-related osteoporosis without current pathological fracture: Secondary | ICD-10-CM | POA: Diagnosis not present

## 2015-12-15 DIAGNOSIS — Z79899 Other long term (current) drug therapy: Secondary | ICD-10-CM

## 2015-12-15 DIAGNOSIS — C50412 Malignant neoplasm of upper-outer quadrant of left female breast: Secondary | ICD-10-CM

## 2015-12-15 DIAGNOSIS — I10 Essential (primary) hypertension: Secondary | ICD-10-CM

## 2015-12-15 NOTE — Patient Instructions (Addendum)
Tamoxifen oral tablet What is this medicine? TAMOXIFEN (ta MOX i fen) blocks the effects of estrogen. It is commonly used to treat breast cancer. It is also used to decrease the chance of breast cancer coming back in women who have received treatment for the disease. It may also help prevent breast cancer in women who have a high risk of developing breast cancer. This medicine may be used for other purposes; ask your health care provider or pharmacist if you have questions. COMMON BRAND NAME(S): Nolvadex What should I tell my health care provider before I take this medicine? They need to know if you have any of these conditions: -blood clots -blood disease -cataracts or impaired eyesight -endometriosis -high calcium levels -high cholesterol -irregular menstrual cycles -liver disease -stroke -uterine fibroids -an unusual reaction to tamoxifen, other medicines, foods, dyes, or preservatives -pregnant or trying to get pregnant -breast-feeding How should I use this medicine? Take this medicine by mouth with a glass of water. Follow the directions on the prescription label. You can take it with or without food. Take your medicine at regular intervals. Do not take your medicine more often than directed. Do not stop taking except on your doctor's advice. A special MedGuide will be given to you by the pharmacist with each prescription and refill. Be sure to read this information carefully each time. Talk to your pediatrician regarding the use of this medicine in children. While this drug may be prescribed for selected conditions, precautions do apply. Overdosage: If you think you have taken too much of this medicine contact a poison control center or emergency room at once. NOTE: This medicine is only for you. Do not share this medicine with others. What if I miss a dose? If you miss a dose, take it as soon as you can. If it is almost time for your next dose, take only that dose. Do not take  double or extra doses. What may interact with this medicine? Do not take this medicine with any of the following medications: -cisapride -certain medicines for irregular heart beat like dofetilide, dronedarone, quinidine -certain medicines for fungal infection like fluconazole, posaconazole -pimozide -saquinavir -thioridazine This medicine may also interact with the following medications: -aminoglutethimide -anastrozole -bromocriptine -chemotherapy drugs -female hormones, like estrogens and birth control pills -letrozole -medroxyprogesterone -phenobarbital -rifampin -warfarin This list may not describe all possible interactions. Give your health care provider a list of all the medicines, herbs, non-prescription drugs, or dietary supplements you use. Also tell them if you smoke, drink alcohol, or use illegal drugs. Some items may interact with your medicine. What should I watch for while using this medicine? Visit your doctor or health care professional for regular checks on your progress. You will need regular pelvic exams, breast exams, and mammograms. If you are taking this medicine to reduce your risk of getting breast cancer, you should know that this medicine does not prevent all types of breast cancer. If breast cancer or other problems occur, there is no guarantee that it will be found at an early stage. Do not become pregnant while taking this medicine or for 2 months after stopping this medicine. Stop taking this medicine if you get pregnant or think you are pregnant and contact your doctor. This medicine may harm your unborn baby. Women who can possibly become pregnant should use birth control methods that do not use hormones during tamoxifen treatment and for 2 months after therapy has stopped. Talk with your health care provider for birth control advice.  Do not breast feed while taking this medicine. What side effects may I notice from receiving this medicine? Side effects that  you should report to your doctor or health care professional as soon as possible: -allergic reactions like skin rash, itching or hives, swelling of the face, lips, or tongue -changes in vision -changes in your menstrual cycle -difficulty walking or talking -new breast lumps -numbness -pelvic pain or pressure -redness, blistering, peeling or loosening of the skin, including inside the mouth -signs and symptoms of a dangerous change in heartbeat or heart rhythm like chest pain, dizziness, fast or irregular heartbeat, palpitations, feeling faint or lightheaded, falls, breathing problems -sudden chest pain -swelling, pain or tenderness in your calf or leg -unusual bruising or bleeding -vaginal discharge that is bloody, brown, or rust -weakness -yellowing of the whites of the eyes or skin Side effects that usually do not require medical attention (report to your doctor or health care professional if they continue or are bothersome): -fatigue -hair loss, although uncommon and is usually mild -headache -hot flashes -impotence (in men) -nausea, vomiting (mild) -vaginal discharge (white or clear) This list may not describe all possible side effects. Call your doctor for medical advice about side effects. You may report side effects to FDA at 1-800-FDA-1088. Where should I keep my medicine? Keep out of the reach of children. Store at room temperature between 20 and 25 degrees C (68 and 77 degrees F). Protect from light. Keep container tightly closed. Throw away any unused medicine after the expiration date. NOTE: This sheet is a summary. It may not cover all possible information. If you have questions about this medicine, talk to your doctor, pharmacist, or health care provider.  2017 Elsevier/Gold Standard (2015-08-04 07:27:41)

## 2015-12-15 NOTE — Progress Notes (Signed)
Patient here today for Bone Density results.

## 2015-12-15 NOTE — Progress Notes (Signed)
Met with patient, her son and granddaughter during her post surgical follow-up with Dr. Mike Gip.  Patient is undecided in regards to agreeing to radiation therapy.  Dr. Mike Gip to discuss plan with Dr. Baruch Gouty and she is going to get back with the patient.  Plan is for antihormonal therapy, treat her osteoporosis and determine if patient is going to get radiation therapy.  Patient is to call if she has any questions or needs.

## 2015-12-15 NOTE — Progress Notes (Signed)
Laura Cisneros is a 82 y.o. female with clinical stage I left breast cancer who is seen for assessment after partial mastectomy.  HPI: The patient was last seen in the medical oncology clinic on 12/08/2015.  At that time, she was seen for initial consultation.  She had clinical stage I breast cancer.  Tumor was ER+ and Her2/neu -.  She had a history of osteoporosis.  We discussed hormonal therapy.  She was scheduled for surgery.  Labs on 12/08/2015 revealed a normal CBC with diff, CMP, and CA27.29 (27.6).  Bone density study on 12/05/2015 revealed osteoporosis with a T-score of -2.9 in AP spine L1-L4 and -2.5 in the right femoral neck.  She underwent partial mastectomy and sentinel lymph node biopsy on 12/08/2015 by Dr. Rochel Cisneros.  Pathology revealed a 0.7 cm grade I  invasive mammary carcinoma of no special type.  There was no lymphvascular invasion.  Margins were clear.  One sentinel lymph node was negative.  Pathologic stage was T1bN0.  Symptomatically, she feels good.  She denies any complaint.  She is taking Tylenol for mild post-operative pain.  She has a post-op visit with Dr. Tamala Cisneros on 12/18/2015.   Past Medical History:  Diagnosis Date  . Anemia   . Arthritis    osteoarthritis  . Atrophic vaginitis   . Cancer ([place])    Breast  . Cataract   . Diverticulosis   . Hiatal hernia   . Hypertension   . Incontinence   . Osteoporosis   . Parkinson disease ([place])   . Vitamin B12 deficiency     Past Surgical History:  Procedure Laterality Date  . ABDOMINAL HYSTERECTOMY    . ABDOMINAL HYSTERECTOMY    . BREAST BIOPSY Left 10/2012   benign  . BREAST BIOPSY Left 11/22/2015   invasive mammary ca   . BREAST EXCISIONAL BIOPSY Left 12/08/2015   lumpectomy  . EYE SURGERY Bilateral    Catarct Extraction with IOL  . FOOT FRACTURE SURGERY Right    pinning, Dr. Francia Cisneros, [facility]  . INCONTINENCE  SURGERY    . PARTIAL MASTECTOMY WITH AXILLARY SENTINEL LYMPH NODE BIOPSY Left 12/08/2015   Procedure: PARTIAL MASTECTOMY WITH AXILLARY SENTINEL LYMPH NODE BIOPSY;  Surgeon: [name], MD;  Location: [facility] ORS;  Service: General;  Laterality: Left;    Family History  Problem Relation Age of Onset  . Kidney disease Neg Hx   . Bladder Cancer Neg Hx   . Breast cancer Neg Hx     Social History:  reports that she has never smoked. She has never used smokeless tobacco. She reports that she does not drink alcohol or use drugs.  She denies any exposure to radiation or toxins.  She previously worked in a [employer].  Her son's phone number is [phone].  The patient is accompanied by her son, Laura Cisneros, and grand daughter, Laura Cisneros, today.  Allergies: No Known Allergies  Current Medications: Current Outpatient Prescriptions  Medication Sig Dispense Refill  . acetaminophen (TYLENOL) 500 MG tablet Take 500 mg by mouth every 6 (six) hours as needed (pain).    . carbidopa-levodopa (SINEMET CR) 50-200 MG tablet Take 3 tablets by mouth 2 (two) times daily.    Marland Kitchen conjugated estrogens (PREMARIN) vaginal cream Place 1 Applicatorful vaginally daily. Apply 0.68m (pea-sized amount)  just inside the vaginal introitus with a finger-tip every night for two weeks and then Monday, Wednesday and  Friday nights. (Patient taking differently: Place 1 g vaginally every Monday, Wednesday, and Friday. ) 30 g 12  . cyanocobalamin (,VITAMIN B-12,) 1000 MCG/ML injection INJECT 1ML INTO MUSCLE ONCE A MONTH AS DIRECTED    . estradiol (ESTRACE) 0.1 MG/GM vaginal cream Place 1 Applicatorful vaginally once a week.    . hydrochlorothiazide (MICROZIDE) 12.5 MG capsule Take 12.5 mg by mouth daily.    . hydroxypropyl methylcellulose / hypromellose (ISOPTO TEARS / GONIOVISC) 2.5 % ophthalmic solution Place 1 drop into both eyes 3 (three) times daily as needed for dry eyes.    . metoprolol succinate (TOPROL-XL) 50 MG 24 hr tablet Take 50 mg  by mouth daily.     . mirabegron ER (MYRBETRIQ) 25 MG TB24 tablet Take 1 tablet (25 mg total) by mouth daily. 90 tablet 4  . HYDROcodone-acetaminophen (NORCO) 5-325 MG tablet Take 1 tablet by mouth every 4 (four) hours as needed for moderate pain. (Patient not taking: Reported on 12/15/2015) 12 tablet 0   No current facility-administered medications for this visit.     Review of Systems:  GENERAL:  Feels "fine".  No fevers or sweats.  Weight down 1 pound. PERFORMANCE STATUS (ECOG):  1-2 HEENT:  Intermittent hoarseness.  No visual changes, runny nose, sore throat, mouth sores or tenderness. Lungs: No shortness of breath or cough.  No hemoptysis. Cardiac:  No chest pain, palpitations, orthopnea, or PND. GI:  Diarrhea, depends on what she eats.  No nausea, vomiting, constipation, melena or hematochezia. GU:  Frequent UTIs.  No urgency, frequency, dysuria, or hematuria. Musculoskeletal:  No back pain.  No joint pain.  No muscle tenderness. Took Zometa x 5 years. Extremities:  No pain or swelling. Skin:  Hands bruise easily.  No rashes or skin changes. Neuro:  Parkinson's disease.  No headache, numbness or weakness,or coordination issues.  Gait is unstable. Endocrine:  No diabetes, thyroid issues, hot flashes or night sweats. Psych:  No mood changes, depression or anxiety. Pain:  No focal pain. Review of systems:  All other systems reviewed and found to be negative.  Physical Exam: Blood pressure 136/84, pulse 72, temperature (!) 94.7 F (34.8 C), temperature source Tympanic, resp. rate 18, weight 142 lb 6.7 oz (64.6 kg). GENERAL:  Thin elderly woman sitting comfortably in the exam room in no acute distress. MENTAL STATUS:  Alert and oriented to person, place and time. HEAD:  Curly gray hair.  Normocephalic, atraumatic, face symmetric, no Cushingoid features. EYES:  Blue eyes.  No conjunctivitis or scleral icterus. BREAST:  Right breast without masses, skin changes or nipple discharge.   Left breast with well healing lateral incision.  No skin changes or nipple discharge.  SKIN:  Well healing axillary incision.  No rashes, ulcers or lesions. NEUROLOGICAL: Tremor. PSYCH:  Appropriate.   No visits with results within 3 Day(s) from this visit.  Latest known visit with results is:  Admission on 12/08/2015, Discharged on 12/08/2015  Component Date Value Ref Range Status  . SURGICAL PATHOLOGY 12/12/2015    Final                   Value:Surgical Pathology CASE: [id number] PATIENT: Lyanne Co Surgical Pathology Report     SPECIMEN SUBMITTED: A. Breast, left, partial mastectomy B. Sentinel lymph node 1, left breast  CLINICAL HISTORY: None provided  PRE-OPERATIVE DIAGNOSIS: Malignant neoplasm of left breast  POST-OPERATIVE DIAGNOSIS: Same as pre-op     DIAGNOSIS: A. BREAST, LEFT; PARTIAL MASTECTOMY: - INVASIVE MAMMARY CARCINOMA OF  NO SPECIAL TYPE, 0.7 CM, GRADE 1. - BIOPSY SITE CHANGES AND MARKER CLIP PRESENT. - ALL MARGINS APPEAR CLEAR.  B. SENTINEL LYMPH NODE #1, LEFT AXILLA; EXCISION: - NEGATIVE FOR MALIGNANCY, ONE LYMPH NODE (0/1).  Comment: ER, PR, and HER2 were assessed on the previous core biopsy ([id number], 11/22/15), with results summarized as follows: ER IHC (SP1): Positive, >90% PR IHC (IE2): Negative HER2 IHC (HercepTest): Equivocal, score 2+ HER2 FISH (DAKO IQFISH PharmDX): Negative (not amplified), HER2/CEP17 ratio 1.12, average number of HER2 signals per cell                          2.1  Surgical Pathology Cancer Case Summary  INVASIVE CARCINOMA OF THE BREAST Procedure: Excision Specimen Laterality: Left Histologic Type: Invasive carcinoma of no special type Histologic Grade (Nottingham Histologic Score)           Glandular (Acinar)/Tubular Differentiation: Score 2           Nuclear Pleomorphism: Score 2           Mitotic Rate: Score 1           Total score: 5           Overall Grade: Grade 1 Tumor Size: 7 mm Ductal  Carcinoma In Situ (DCIS): Present           Nuclear Grade: Intermediate           Extensive intraductal component: Negative Tumor Extension: Not applicable Lymphovascular Invasion: Not identified Treatment Effect: Not applicable Margins:           Invasive carcinoma: Negative           Distance from closest margin: 6 mm from deep margin            DCIS: Negative           Distance from closest margin: 5 mm from deep margin  Regional Lymph nodes:      Total # lymph nodes examined: 1      # Sentinel lymph nodes examined: 1                                    Lymph node involvement: All lymph nodes are negative  Pathologic Stage Classification (pTNM, AJCC 7th Edition)      TNM Descriptors: Not applicable      pTNM: UX3A pN0 (sn)   GROSS DESCRIPTION: A. Intraoperative consultation:     Received: fresh     Specimen: left breast partial mastectomy, incision 2:00-3:30, stitch at 3:30     Pathologic Evaluation: immediate gross evaluation of margins     Diagnosis: Left breast; partial mastectomy     - Mass and marker clip identified.     - Margin clear on gross exam.     Communicated to: Dr. Tamala Cisneros at 12:52 PM on 12/08/2015, Bryan Lemma M.D.     Tissue submitted: not applicable  A. Labeled: left breast partial mastectomy incision goes from 2:00 to 3:30 position stitch at 3:30 Time in fixative: 12:53 PM Cold Ischemic time: 33 minutes Formalin fixation time: 8.5 hours Type of specimen: excision Location of specimen: left Size of specimen: 7.1 (superior-inferior) by 6.2 (medial-lateral) by 1.5 (anterior-posterior)                          centimeter Skin: present, 2.8 x 0.8 cm  wrinkled pink Direction of compression: anterior-posterior Needle localization: yes, 1  Orientation of specimen: cranial, caudal, medial, lateral and deep metallic markers Superior = blue Inferior = green Medial = yellow Lateral = orange Posterior = black Anterior/Superficial = red and skin  Biopsy  site: present with clip Presence/absence of discrete mass: present Number of discrete masses: 1 Size of mass: 0.4 x 0.4 x 0.3 cm Description of mass: well-defined pink-tan with metallic clip Distance between masses/clips: not applicable Distance of mass/biopsy site/clip to surgical margins: anterior-1.3 cm, deep-0.6 cm, lateral-1.1 cm, medial-4.4 cm, inferior-2.1 cm and superior-3.6 cm Description of remainder of tissue: yellow lobulated fibrofatty  Block summary: 1-2-entire mass with closest perpendicular margin (cassette 2 containing metallic clip from radiograph) 3-perpendicular anterior 4-perpendicular inferior 5-perpe                         ndicular superior 6-perpendicular medial  B. Labeled: left breast Sentinel lymph node #1 Tissue fragment(s): 1 Size: 1.5 x 1.0 x 0.4 cm Description: lymph node candidate with attached fibrofatty tissue bisected  Entirely submitted in 1 cassette(s).  Final Diagnosis performed by Bryan Lemma, MD.  Electronically signed 12/12/2015 3:10:47PM    The electronic signature indicates that the named Attending Pathologist has evaluated the specimen  Technical component performed at [facility], [address], [place], [place] [address] Lab: [phone] Dir: Darrick Penna. Evette Doffing, MD  Professional component performed at [facility], [facility], [place], [place], [place] [address] Lab: [phone] Dir: Dellia Nims. Rubinas, MD      Assessment:  Laura Cisneros is a 82 y.o. female with stage I left breast cancer s/p partial mastectomy and sentinel lymph node biopsy on 12/08/2015.  Pathology revealed a 0.7 cm grade I  invasive mammary carcinoma of no special type.  There was no lymphvascular invasion.  Margins were clear.  One sentinel lymph node was negative.  Tumor was ER positive (> 90%), PR negative, and HER-2/neu 2+ (negative by FISH).  Pathologic stage was T1bN0.  CA 27.29 was 27.6 on 12/08/2015.  Bilateral diagnostic  mammogram on 11/06/2015 revealed a suspicious 5 mm mass in the upper outer left breast.   She has osteoporosis and was previously on Zometa for 5 years.  Bone density study on 12/05/2015 revealed osteoporosis with a T-score of -2.9 in AP spine L1-L4 and -2.5 in the right femoral neck.  She has Parkinson's disease.  Gait is unstable.  She has a history of iron deficiency.  She notes 2 colonoscopies in the past. She has been on B12 shots for years.   Symptomatically, she is doing well.  Exam reveals post-operative changes in the left breast  Plan: 1.  Discuss pathology from partial mastectomy.  She has stage I disease.  Tumor is small and well differentiated.  Given her age and small well differentiated tumor, it is unclear if she should receive radiation.  I discussed talking to Dr. Baruch Gouty regarding his recommendations.  She would prefer not to have radiation unless necessary.  I discussed hormonal therapy (tamoxifen versus aromatase inhibitor) for 5 years.  Side effects were again reviewed in detail.  Discuss consideration of tamoxifen given bone density study.  Information on tamoxifen provided. 2.  Discuss bone density study and osteoporosis.  Discuss calcium and vitamin D.  Discuss consideration of Prolia.  Patient interested.  We discussed issues with osteonecrosis.  She is edentulous. 3.  Preauth Prolia. 4.  Contact patient's son after case reviewed with Dr. Baruch Gouty.  5.  RTC in 1 month for MD assessment, labs (CBC with diff, CMP), Prolia, and initiation of tamoxifen.   Lequita Asal, MD  12/15/2015, 10:14 AM

## 2015-12-17 ENCOUNTER — Encounter: Payer: Self-pay | Admitting: Hematology and Oncology

## 2015-12-19 ENCOUNTER — Inpatient Hospital Stay: Payer: Medicare Other

## 2015-12-19 ENCOUNTER — Other Ambulatory Visit: Payer: Self-pay | Admitting: *Deleted

## 2015-12-19 DIAGNOSIS — C50412 Malignant neoplasm of upper-outer quadrant of left female breast: Secondary | ICD-10-CM | POA: Diagnosis not present

## 2015-12-19 DIAGNOSIS — M810 Age-related osteoporosis without current pathological fracture: Secondary | ICD-10-CM

## 2015-12-19 DIAGNOSIS — Z170 Estrogen receptor positive status [ER+]: Principal | ICD-10-CM

## 2015-12-19 LAB — BASIC METABOLIC PANEL
Anion gap: 8 (ref 5–15)
BUN: 17 mg/dL (ref 6–20)
CO2: 28 mmol/L (ref 22–32)
Calcium: 8.7 mg/dL — ABNORMAL LOW (ref 8.9–10.3)
Chloride: 102 mmol/L (ref 101–111)
Creatinine, Ser: 0.53 mg/dL (ref 0.44–1.00)
GFR calc Af Amer: >60 mL/min (ref >60)
GFR calc non Af Amer: >60 mL/min (ref >60)
Glucose, Bld: 127 mg/dL — ABNORMAL HIGH (ref 65–99)
Potassium: 3.1 mmol/L — ABNORMAL LOW (ref 3.5–5.1)
Sodium: 138 mmol/L (ref 135–145)

## 2015-12-19 MED ORDER — DENOSUMAB 60 MG/ML ~~LOC~~ SOLN
60.0000 mg | Freq: Once | SUBCUTANEOUS | Status: AC
  Administered 2015-12-19: 60 mg via SUBCUTANEOUS
  Filled 2015-12-19: qty 1

## 2015-12-20 NOTE — Progress Notes (Signed)
  Oncology Nurse Navigator Documentation  Navigator Location: [facility] (12/20/15 1500)   )Navigator Encounter Type: Treatment (12/20/15 1500)                     Patient Visit Type: Follow-up (12/20/15 1500)                              Time Spent with Patient: 45 (12/20/15 1500)  Met patient and son in [place]. Question whether she should use Estrace cream for UTI prevention, as she had prior to Breast Cancer Diagnosis. She was using cream 3 times per week. Stopped due to ER/PR positive cancer.  Spoke to Dr. Mike Gip.  Patient instructed to use least amount needed to prevent UTI.  Will begin with use once a week to see if effective in preventing UTI's.

## 2015-12-25 ENCOUNTER — Telehealth: Payer: Self-pay | Admitting: *Deleted

## 2015-12-25 NOTE — Telephone Encounter (Signed)
-----   Message from Lequita Asal, MD sent at 12/19/2015  8:46 PM EST ----- Regarding: Please call patient  Spoke with Dr. Baruch Gouty.  He would like to see her for a consult to discuss possible radiation.  Ensure patient is aware of low potassium and PCP faxed potassium results.  M

## 2015-12-25 NOTE — Telephone Encounter (Signed)
Called patient's son [name] and informed him that patient's K+ is down.  She should eat K+ rich foods (bananas, peanuts, backed potatoes with skin on, baked sweet potaotes) or drink OJ.  Labs were sent to PCP, who may prescribe oral K+. [name] verbalized understanding.

## 2015-12-25 NOTE — Telephone Encounter (Signed)
Called pt's son and asked about if pt could see chrystal as new consult for poss. Radiation.  He and his mother was in agreement with the appt. 12/6 at 2:30.

## 2016-01-03 ENCOUNTER — Ambulatory Visit
Admission: RE | Admit: 2016-01-03 | Discharge: 2016-01-03 | Disposition: A | Discharge status: Home / Self Care | Payer: Medicare Other | Source: Ambulatory Visit | Attending: Radiation Oncology | Admitting: Radiation Oncology

## 2016-01-03 VITALS — BP 141/77 | HR 67 | Temp 97.2°F | Wt 100.3 lb

## 2016-01-03 DIAGNOSIS — Z170 Estrogen receptor positive status [ER+]: Principal | ICD-10-CM

## 2016-01-03 DIAGNOSIS — C50412 Malignant neoplasm of upper-outer quadrant of left female breast: Secondary | ICD-10-CM

## 2016-01-03 NOTE — Consult Note (Signed)
NEW PATIENT EVALUATION  Name: Laura Cisneros  MRN: [id number]  Date:   01/03/2016     DOB: 1925/08/05   This 82 y.o. female patient presents to the clinic for initial evaluation of stage I invasive mammary carcinoma the left breast status post wide local excision. In the upper outer portion of the left breast approximate 6 mm in dimension. She underwent ultrasound guided biopsy positive for invasive mammary carcinoma. She underwent wide local excision for a 7 mm ER/PR positive HER-2/neu negative invasive mammary carcinoma grade 1 with margins clear at 6 mm. She has done well postoperatively. Patient has Parkinson's disease making travel difficult. She is now referred to radiation oncology for opinion.  REFERRING PHYSICIAN: Leonel Ramsay, MD  CHIEF COMPLAINT: No chief complaint on file.   DIAGNOSIS: There were no encounter diagnoses.   PREVIOUS INVESTIGATIONS:  Mammograms and ultrasound reviewed Pathology reviewed Clinical notes reviewed  HPI: As above PLANNED TREATMENT REGIMEN: Anti-estrogen therapy plus observation  PAST MEDICAL HISTORY:  has a past medical history of Anemia; Arthritis; Atrophic vaginitis; Cancer ([place]); Cataract; Diverticulosis; Hiatal hernia; Hypertension; Incontinence; Osteoporosis; Parkinson disease ([place]); and Vitamin B12 deficiency.    PAST SURGICAL HISTORY:  Past Surgical History:  Procedure Laterality Date  . ABDOMINAL HYSTERECTOMY    . ABDOMINAL HYSTERECTOMY    . BREAST BIOPSY Left 10/2012   benign  . BREAST BIOPSY Left 11/22/2015   invasive mammary ca   . BREAST EXCISIONAL BIOPSY Left 12/08/2015   lumpectomy  . EYE SURGERY Bilateral    Catarct Extraction with IOL  . FOOT FRACTURE SURGERY Right    pinning, Dr. Francia Greaves, [facility]  . INCONTINENCE SURGERY    . PARTIAL MASTECTOMY WITH AXILLARY SENTINEL LYMPH NODE BIOPSY Left 12/08/2015   Procedure: PARTIAL MASTECTOMY WITH AXILLARY SENTINEL LYMPH NODE BIOPSY;  Surgeon: [name], MD;  Location:  [facility] ORS;  Service: General;  Laterality: Left;    FAMILY HISTORY: family history is not on file.  SOCIAL HISTORY:  reports that she has never smoked. She has never used smokeless tobacco. She reports that she does not drink alcohol or use drugs.  ALLERGIES: Patient has no known allergies.  MEDICATIONS:  Current Outpatient Prescriptions  Medication Sig Dispense Refill  . acetaminophen (TYLENOL) 500 MG tablet Take 500 mg by mouth every 6 (six) hours as needed (pain).    . carbidopa-levodopa (SINEMET CR) 50-200 MG tablet Take 3 tablets by mouth 2 (two) times daily.    Marland Kitchen conjugated estrogens (PREMARIN) vaginal cream Place 1 Applicatorful vaginally daily. Apply 0.78m (pea-sized amount)  just inside the vaginal introitus with a finger-tip every night for two weeks and then Monday, Wednesday and Friday nights. (Patient taking differently: Place 1 g vaginally every Monday, Wednesday, and Friday. ) 30 g 12  . cyanocobalamin (,VITAMIN B-12,) 1000 MCG/ML injection INJECT 1ML INTO MUSCLE ONCE A MONTH AS DIRECTED    . estradiol (ESTRACE) 0.1 MG/GM vaginal cream Place 1 Applicatorful vaginally once a week.    . hydrochlorothiazide (MICROZIDE) 12.5 MG capsule Take 12.5 mg by mouth daily.    .Marland KitchenHYDROcodone-acetaminophen (NORCO) 5-325 MG tablet Take 1 tablet by mouth every 4 (four) hours as needed for moderate pain. (Patient not taking: Reported on 12/15/2015) 12 tablet 0  . hydroxypropyl methylcellulose / hypromellose (ISOPTO TEARS / GONIOVISC) 2.5 % ophthalmic solution Place 1 drop into both eyes 3 (three) times daily as needed for dry eyes.    . metoprolol succinate (TOPROL-XL) 50 MG 24 hr tablet Take 50 mg  by mouth daily.     . mirabegron ER (MYRBETRIQ) 25 MG TB24 tablet Take 1 tablet (25 mg total) by mouth daily. 90 tablet 4   No current facility-administered medications for this encounter.     ECOG PERFORMANCE STATUS:  0 - Asymptomatic  REVIEW OF SYSTEMS:  Patient denies any weight loss, fatigue,  weakness, fever, chills or night sweats. Patient denies any loss of vision, blurred vision. Patient denies any ringing  of the ears or hearing loss. No irregular heartbeat. Patient denies heart murmur or history of fainting. Patient denies any chest pain or pain radiating to her upper extremities. Patient denies any shortness of breath, difficulty breathing at night, cough or hemoptysis. Patient denies any swelling in the lower legs. Patient denies any nausea vomiting, vomiting of blood, or coffee ground material in the vomitus. Patient denies any stomach pain. Patient states has had normal bowel movements no significant constipation or diarrhea. Patient denies any dysuria, hematuria or significant nocturia. Patient denies any problems walking, swelling in the joints or loss of balance. Patient denies any skin changes, loss of hair or loss of weight. Patient denies any excessive worrying or anxiety or significant depression. Patient denies any problems with insomnia. Patient denies excessive thirst, polyuria, polydipsia. Patient denies any swollen glands, patient denies easy bruising or easy bleeding. Patient denies any recent infections, allergies or URI. Patient "s visual fields have not changed significantly in recent time.    PHYSICAL EXAM: There were no vitals taken for this visit. Elderly female with obvious Parkinson's disease in NAD. She status post wide local excision of the left breast with incision healing well. No dominant mass or nodularity is noted in either breast in 2 positions examined. No axillary or supraclavicular adenopathy is identified. Well-developed well-nourished patient in NAD. HEENT reveals PERLA, EOMI, discs not visualized.  Oral cavity is clear. No oral mucosal lesions are identified. Neck is clear without evidence of cervical or supraclavicular adenopathy. Lungs are clear to A&P. Cardiac examination is essentially unremarkable with regular rate and rhythm without murmur rub or  thrill. Abdomen is benign with no organomegaly or masses noted. Motor sensory and DTR levels are equal and symmetric in the upper and lower extremities. Cranial nerves II through XII are grossly intact. Proprioception is intact. No peripheral adenopathy or edema is identified. No motor or sensory levels are noted. Crude visual fields are within normal range.   LABORATORY DATA: Pathology reports reviewed    RADIOLOGY RESULTS: Mammograms and ultrasound reviewed   IMPRESSION: Stage I invasive mammary carcinoma well-differentiated the left breast status post wide local excision in 82 year old female with Parkinson's disease.  PLAN: Based on the patient's history of Parkinson's disease, small well-differentiated grade 1 tumor ER/PR positivity believe we can observe this patient now and treat only with antiestrogen therapy. My recognition was made to the family and they all are in agreement with our treatment plan. Patient will follow-up with medical oncology. We'll have repeat mammograms in a serial basis. I would be happy to reevaluate the patient any time should further opinion be indicated.  I would like to take this opportunity to thank you for allowing me to participate in the care of your patient.Armstead Peaks., MD

## 2016-01-11 ENCOUNTER — Ambulatory Visit (INDEPENDENT_AMBULATORY_CARE_PROVIDER_SITE_OTHER): Payer: Medicare Other | Admitting: Urology

## 2016-01-11 ENCOUNTER — Telehealth: Payer: Self-pay | Admitting: *Deleted

## 2016-01-11 ENCOUNTER — Encounter: Payer: Self-pay | Admitting: Urology

## 2016-01-11 VITALS — BP 148/84 | HR 65 | Ht 59.0 in | Wt 143.2 lb

## 2016-01-11 DIAGNOSIS — N952 Postmenopausal atrophic vaginitis: Secondary | ICD-10-CM | POA: Diagnosis not present

## 2016-01-11 DIAGNOSIS — R32 Unspecified urinary incontinence: Secondary | ICD-10-CM

## 2016-01-11 DIAGNOSIS — N390 Urinary tract infection, site not specified: Secondary | ICD-10-CM

## 2016-01-11 LAB — BLADDER SCAN AMB NON-IMAGING: SCAN RESULT: 67

## 2016-01-11 NOTE — Telephone Encounter (Signed)
Called patient to inform her that her prolia was not due tomorrow but she would still see MD and have her labs drawn, voiced understanding.

## 2016-01-11 NOTE — Progress Notes (Signed)
01/11/2016 3:14 PM   Laura Cisneros 12/23/1925 [id number]  Referring provider: Leonel Ramsay, MD [place], [place] [address]  Chief Complaint  Patient presents with  . Urinary Incontinence    3 month follow up     HPI: Patient is an 82 year old Caucasian female who presents today for a three month follow up for incontinence, vaginal atrophy and a history of recurrent.      Background history Patient was referred by her PCP, Dr. Ola Spurr, for urinary frequency and recurrent UTI's.  Patient states that she has had four urinary tract infections over the last year.  Her symptoms with a urinary tract infection consist of frequency, dysuria, urgency, low back pain and incontinence.  She denies gross hematuria, suprapubic pain, abdominal pain or flank pain.  She has not had any recent fevers, chills, nausea or vomiting.   She does not have a history of nephrolithiasis, GU surgery or GU trauma.   Reviewing her records,  she has had three documented UTI's over the last year.  +  Variable resistance  E. Coli on 01/24/2015  +  Variable resistance E. Coli on 06/28/2015  +  Variable resistance E. Coli on 08/07/2015  +  Pan sensitive E. Coli on 09/06/2015  She is not sexually active.   She is post menopausal.  She admits to diarrhea.  She does engage in good perineal hygiene. She does not take tub baths.  She does  have incontinence.  She is using incontinence pads. Four pads daily.  PVR was 0 mL.  She underwent a non contrast CT in 2015 which did not identify any urinary tract stones, obstruction or other acute abnormality.  She is drinking a lot of water daily, taking cranberry tablets and taking vitamin C.    Patient has no bother with urgency, incontinence or night time urination.  She denies any gross hematuria, dysuria or suprapubic pain.  He has not had fevers, chills, nausea or vomiting.  Her PVR is 0 mL.  Her frequency is  reduced greatly and she is no longer wearing pads, except when she travels long distances.     PMH: Past Medical History:  Diagnosis Date  . Anemia   . Arthritis    osteoarthritis  . Atrophic vaginitis   . Cancer ([place])    Breast  . Cataract   . Diverticulosis   . Hiatal hernia   . Hypertension   . Incontinence   . Osteoporosis   . Parkinson disease ([place])   . Vitamin B12 deficiency     Surgical History: Past Surgical History:  Procedure Laterality Date  . ABDOMINAL HYSTERECTOMY    . ABDOMINAL HYSTERECTOMY    . BREAST BIOPSY Left 10/2012   benign  . BREAST BIOPSY Left 11/22/2015   invasive mammary ca   . BREAST EXCISIONAL BIOPSY Left 12/08/2015   lumpectomy  . EYE SURGERY Bilateral    Catarct Extraction with IOL  . FOOT FRACTURE SURGERY Right    pinning, Dr. Francia Greaves, [facility]  . INCONTINENCE SURGERY    . PARTIAL MASTECTOMY WITH AXILLARY SENTINEL LYMPH NODE BIOPSY Left 12/08/2015   Procedure: PARTIAL MASTECTOMY WITH AXILLARY SENTINEL LYMPH NODE BIOPSY;  Surgeon: [name], MD;  Location: [facility] ORS;  Service: General;  Laterality: Left;    Home Medications:    Medication List       Accurate as of 01/11/16  3:14 PM. Always use your most  recent med list.          acetaminophen 500 MG tablet Commonly known as:  TYLENOL Take 500 mg by mouth every 6 (six) hours as needed (pain).   carbidopa-levodopa 50-200 MG tablet Commonly known as:  SINEMET CR Take 3 tablets by mouth 2 (two) times daily.   conjugated estrogens vaginal cream Commonly known as:  PREMARIN Place 1 Applicatorful vaginally daily. Apply 0.5mg  (pea-sized amount)  just inside the vaginal introitus with a finger-tip every night for two weeks and then Monday, Wednesday and Friday nights.   cyanocobalamin 1000 MCG/ML injection Commonly known as:  (VITAMIN B-12) INJECT 1ML INTO MUSCLE ONCE A MONTH AS DIRECTED   estradiol 0.1 MG/GM vaginal cream Commonly known as:  ESTRACE Place 1 Applicatorful  vaginally once a week.   hydrochlorothiazide 12.5 MG capsule Commonly known as:  MICROZIDE Take 12.5 mg by mouth daily.   HYDROcodone-acetaminophen 5-325 MG tablet Commonly known as:  NORCO Take 1 tablet by mouth every 4 (four) hours as needed for moderate pain.   hydroxypropyl methylcellulose / hypromellose 2.5 % ophthalmic solution Commonly known as:  ISOPTO TEARS / GONIOVISC Place 1 drop into both eyes 3 (three) times daily as needed for dry eyes.   metoprolol succinate 50 MG 24 hr tablet Commonly known as:  TOPROL-XL Take 50 mg by mouth daily.   mirabegron ER 25 MG Tb24 tablet Commonly known as:  MYRBETRIQ Take 1 tablet (25 mg total) by mouth daily.   potassium chloride 10 MEQ tablet Commonly known as:  K-DUR Take by mouth.       Allergies: No Known Allergies  Family History: Family History  Problem Relation Age of Onset  . Kidney disease Neg Hx   . Bladder Cancer Neg Hx   . Breast cancer Neg Hx     Social History:  reports that she has never smoked. She has never used smokeless tobacco. She reports that she does not drink alcohol or use drugs.  ROS: UROLOGY Frequent Urination?: No Hard to postpone urination?: No Burning/pain with urination?: No Get up at night to urinate?: No Leakage of urine?: No Urine stream starts and stops?: No Trouble starting stream?: No Do you have to strain to urinate?: No Blood in urine?: No Urinary tract infection?: No Sexually transmitted disease?: No Injury to kidneys or bladder?: No Painful intercourse?: No Weak stream?: No Currently pregnant?: No Vaginal bleeding?: No Last menstrual period?: n  Gastrointestinal Nausea?: No Vomiting?: No Indigestion/heartburn?: No Diarrhea?: Yes Constipation?: No  Constitutional Fever: No Night sweats?: No Weight loss?: No Fatigue?: Yes  Skin Skin rash/lesions?: No Itching?: No  Eyes Blurred vision?: No Double vision?: No  Ears/Nose/Throat Sore throat?: No Sinus  problems?: No  Hematologic/Lymphatic Swollen glands?: No Easy bruising?: Yes  Cardiovascular Leg swelling?: No Chest pain?: No  Respiratory Cough?: No Shortness of breath?: No  Endocrine Excessive thirst?: No  Musculoskeletal Back pain?: Yes Joint pain?: No  Neurological Headaches?: No Dizziness?: No  Psychologic Depression?: Yes Anxiety?: Yes  Physical Exam: BP (!) 148/84   Pulse 65   Ht 4\' 11"  (1.499 m)   Wt 143 lb 3.2 oz (65 kg)   BMI 28.92 kg/m   Constitutional: Well nourished. Alert and oriented, No acute distress. HEENT: [place] AT, moist mucus membranes. Trachea midline, no masses. Cardiovascular: No clubbing, cyanosis, or edema. Respiratory: Normal respiratory effort, no increased work of breathing. Skin: No rashes, bruises or suspicious lesions. Lymph: No cervical or inguinal adenopathy. Neurologic: Grossly intact, no focal deficits,  moving all 4 extremities. Psychiatric: Normal mood and affect.  Laboratory Data: Lab Results  Component Value Date   WBC 8.1 12/04/2015   HGB 13.1 12/04/2015   HCT 38.3 12/04/2015   MCV 86.6 12/04/2015   PLT 257 12/04/2015    Lab Results  Component Value Date   CREATININE 0.53 12/19/2015    Lab Results  Component Value Date   AST 13 (L) 12/04/2015   Lab Results  Component Value Date   ALT <5 (L) 12/04/2015   Pertinent Imaging: Results for LUCA, SANANGELO (MRN [id number]) as of 01/11/2016 15:07  Ref. Range 01/11/2016 14:59  Scan Result Unknown 67    Assessment & Plan:    1. Recurrent UTI's  - reviewed UTI prevention  - remind patient to contact our office when she has symptoms of an UTI  2. Incontinence  - PVR was 0 mL  - continue estrogen cream; once weekly  - continue Myrbetriq 25 mg daily  - present to clinic in 12 months for PVR and symptom recheck   3. Vaginal atrophy  - continue applying the cream once weekly  - RTC in 12 months for an exam  Return in about 1 year (around 01/10/2017) for  PVR, exam and OAB questionnaire.  These notes generated with voice recognition software. I apologize for typographical errors.  Zara Council, [facility] [address] [place], [place] [address] [phone]

## 2016-01-11 NOTE — Telephone Encounter (Signed)
Message left for Laura Cisneros pts son that the appt for tomorrow was at 19 for labs and MD.  Instructed to call [phone] for questions.

## 2016-01-12 ENCOUNTER — Telehealth: Payer: Self-pay

## 2016-01-12 ENCOUNTER — Other Ambulatory Visit: Payer: Self-pay

## 2016-01-12 ENCOUNTER — Inpatient Hospital Stay: Payer: Medicare Other

## 2016-01-12 ENCOUNTER — Inpatient Hospital Stay (HOSPITAL_BASED_OUTPATIENT_CLINIC_OR_DEPARTMENT_OTHER): Payer: Medicare Other | Admitting: Hematology and Oncology

## 2016-01-12 ENCOUNTER — Inpatient Hospital Stay: Payer: Medicare Other | Admitting: Hematology and Oncology

## 2016-01-12 ENCOUNTER — Inpatient Hospital Stay: Payer: Medicare Other | Attending: Hematology and Oncology

## 2016-01-12 ENCOUNTER — Telehealth: Payer: Self-pay | Admitting: *Deleted

## 2016-01-12 VITALS — BP 123/74 | HR 56 | Temp 96.7°F | Resp 18 | Wt 143.1 lb

## 2016-01-12 DIAGNOSIS — C50412 Malignant neoplasm of upper-outer quadrant of left female breast: Secondary | ICD-10-CM

## 2016-01-12 DIAGNOSIS — G20 Parkinson's disease: Secondary | ICD-10-CM

## 2016-01-12 DIAGNOSIS — D509 Iron deficiency anemia, unspecified: Secondary | ICD-10-CM | POA: Insufficient documentation

## 2016-01-12 DIAGNOSIS — M1990 Unspecified osteoarthritis, unspecified site: Secondary | ICD-10-CM | POA: Diagnosis not present

## 2016-01-12 DIAGNOSIS — Z170 Estrogen receptor positive status [ER+]: Secondary | ICD-10-CM | POA: Insufficient documentation

## 2016-01-12 DIAGNOSIS — I10 Essential (primary) hypertension: Secondary | ICD-10-CM | POA: Insufficient documentation

## 2016-01-12 DIAGNOSIS — M810 Age-related osteoporosis without current pathological fracture: Secondary | ICD-10-CM | POA: Diagnosis not present

## 2016-01-12 DIAGNOSIS — R490 Dysphonia: Secondary | ICD-10-CM | POA: Diagnosis not present

## 2016-01-12 DIAGNOSIS — E538 Deficiency of other specified B group vitamins: Secondary | ICD-10-CM | POA: Insufficient documentation

## 2016-01-12 DIAGNOSIS — Z79899 Other long term (current) drug therapy: Secondary | ICD-10-CM | POA: Diagnosis not present

## 2016-01-12 DIAGNOSIS — Z87440 Personal history of urinary (tract) infections: Secondary | ICD-10-CM

## 2016-01-12 DIAGNOSIS — Z79810 Long term (current) use of selective estrogen receptor modulators (SERMs): Secondary | ICD-10-CM | POA: Diagnosis not present

## 2016-01-12 LAB — COMPREHENSIVE METABOLIC PANEL
ALT: <5 U/L — ABNORMAL LOW (ref 14–54)
AST: 13 U/L — ABNORMAL LOW (ref 15–41)
Albumin: 3.8 g/dL (ref 3.5–5.0)
Alkaline Phosphatase: 71 U/L (ref 38–126)
Anion gap: 7 (ref 5–15)
BUN: 26 mg/dL — ABNORMAL HIGH (ref 6–20)
CO2: 28 mmol/L (ref 22–32)
Calcium: 9 mg/dL (ref 8.9–10.3)
Chloride: 102 mmol/L (ref 101–111)
Creatinine, Ser: 0.69 mg/dL (ref 0.44–1.00)
GFR calc Af Amer: >60 mL/min (ref >60)
GFR calc non Af Amer: >60 mL/min (ref >60)
Glucose, Bld: 96 mg/dL (ref 65–99)
Potassium: 3.6 mmol/L (ref 3.5–5.1)
Sodium: 137 mmol/L (ref 135–145)
Total Bilirubin: 0.7 mg/dL (ref 0.3–1.2)
Total Protein: 7.2 g/dL (ref 6.5–8.1)

## 2016-01-12 LAB — CBC WITH DIFFERENTIAL/PLATELET
Basophils Absolute: 0.1 10*3/uL (ref 0–0.1)
Basophils Relative: 1 %
Eosinophils Absolute: 0.2 10*3/uL (ref 0–0.7)
Eosinophils Relative: 2 %
HCT: 38.9 % (ref 35.0–47.0)
Hemoglobin: 12.8 g/dL (ref 12.0–16.0)
Lymphocytes Relative: 32 %
Lymphs Abs: 2.2 10*3/uL (ref 1.0–3.6)
MCH: 28.4 pg (ref 26.0–34.0)
MCHC: 33 g/dL (ref 32.0–36.0)
MCV: 85.8 fL (ref 80.0–100.0)
Monocytes Absolute: 0.8 10*3/uL (ref 0.2–0.9)
Monocytes Relative: 12 %
Neutro Abs: 3.7 10*3/uL (ref 1.4–6.5)
Neutrophils Relative %: 53 %
Platelets: 241 10*3/uL (ref 150–440)
RBC: 4.53 MIL/uL (ref 3.80–5.20)
RDW: 14.4 % (ref 11.5–14.5)
WBC: 7 10*3/uL (ref 3.6–11.0)

## 2016-01-12 MED ORDER — TAMOXIFEN CITRATE 20 MG PO TABS: 20.0000 mg | ORAL_TABLET | Freq: Every day | ORAL | 3 refills | Status: DC

## 2016-01-12 NOTE — Telephone Encounter (Signed)
She can have some samples of the Toviaz 4 mg daily. Down I would like to see her in 1 month for a PVR and OAB questionnaire.

## 2016-01-12 NOTE — Progress Notes (Signed)
[facility] day:  01/12/2016   Chief Complaint: Laura Cisneros is a 82 y.o. female with clinical stage I left breast cancer who is seen for 1 month assessment.  HPI: The patient was last seen in the medical oncology clinic on 12/15/2015.  At that time, she was seen for assessment after partial mastectomy. Pathology was reviewed.  She has hormone receptor positive stage I disease.  It was recommended that she follow-up with Dr. Baruch Gouty for discussion regarding radiation.  She was hesitant.  We discussed hormonal therapy (tamoxifen versus aromatase inhibitor) for 5 years.  Side effects were reviewed.  We discussed consideration of tamoxifen given her osteoporosis.  At last visit, we discussed Prolia for her osteoporosis.  She received Prolia on 12/19/2015.  Chemistries included a potassium of 3.1 and a calcium of 8.7.  She met with Dr. Baruch Gouty on 01/03/2016.  Based on the patient's history of Parkinson's disease, small well-differentiated grade I tumor, and ER/PR positivity, radiation was deferred with plan to  treat only with antiestrogen therapy.  She started calcium and vitamin D with her last visit.  She notes that her breast is still sore. She was started on potassium by Dr. Ola Spurr.  She was seen by urology for recurrent UTIs, vaginal atrophy, and incontinence.  She is using estrogen cream sparingly (once a week).   Past Medical History:  Diagnosis Date  . Anemia   . Arthritis    osteoarthritis  . Atrophic vaginitis   . Cancer ([place])    Breast  . Cataract   . Diverticulosis   . Hiatal hernia   . Hypertension   . Incontinence   . Osteoporosis   . Parkinson disease ([place])   . Vitamin B12 deficiency     Past Surgical History:  Procedure Laterality Date  . ABDOMINAL HYSTERECTOMY    . ABDOMINAL HYSTERECTOMY    . BREAST BIOPSY Left 10/2012   benign  . BREAST BIOPSY Left 11/22/2015   invasive mammary ca   . BREAST EXCISIONAL BIOPSY  Left 12/08/2015   lumpectomy  . EYE SURGERY Bilateral    Catarct Extraction with IOL  . FOOT FRACTURE SURGERY Right    pinning, Dr. Francia Greaves, [facility]  . INCONTINENCE SURGERY    . PARTIAL MASTECTOMY WITH AXILLARY SENTINEL LYMPH NODE BIOPSY Left 12/08/2015   Procedure: PARTIAL MASTECTOMY WITH AXILLARY SENTINEL LYMPH NODE BIOPSY;  Surgeon: [name], MD;  Location: [facility] ORS;  Service: General;  Laterality: Left;    Family History  Problem Relation Age of Onset  . Kidney disease Neg Hx   . Bladder Cancer Neg Hx   . Breast cancer Neg Hx     Social History:  reports that she has never smoked. She has never used smokeless tobacco. She reports that she does not drink alcohol or use drugs.  She denies any exposure to radiation or toxins.  She previously worked in a [employer].  Her son's phone number is [phone].  Her son is Games developer.  The patient is accompanied by her daughter-in-law and grand daughter, [name], today.  Allergies: No Known Allergies  Current Medications: Current Outpatient Prescriptions  Medication Sig Dispense Refill  . acetaminophen (TYLENOL) 500 MG tablet Take 500 mg by mouth every 6 (six) hours as needed (pain).    . carbidopa-levodopa (SINEMET CR) 50-200 MG tablet Take 3 tablets by mouth 2 (two) times daily.    Marland Kitchen conjugated estrogens (PREMARIN) vaginal cream Place 1 Applicatorful vaginally daily. Apply  0.'5mg'$  (pea-sized amount)  just inside the vaginal introitus with a finger-tip every night for two weeks and then Monday, Wednesday and Friday nights. (Patient taking differently: Place 1 g vaginally every Monday, Wednesday, and Friday. ) 30 g 12  . cyanocobalamin (,VITAMIN B-12,) 1000 MCG/ML injection INJECT 1ML INTO MUSCLE ONCE A MONTH AS DIRECTED    . estradiol (ESTRACE) 0.1 MG/GM vaginal cream Place 1 Applicatorful vaginally once a week.    . hydrochlorothiazide (MICROZIDE) 12.5 MG capsule Take 12.5 mg by mouth daily.    . hydroxypropyl methylcellulose / hypromellose  (ISOPTO TEARS / GONIOVISC) 2.5 % ophthalmic solution Place 1 drop into both eyes 3 (three) times daily as needed for dry eyes.    . metoprolol succinate (TOPROL-XL) 50 MG 24 hr tablet Take 50 mg by mouth daily.     . mirabegron ER (MYRBETRIQ) 25 MG TB24 tablet Take 1 tablet (25 mg total) by mouth daily. 90 tablet 4  . potassium chloride (K-DUR) 10 MEQ tablet Take by mouth.    Marland Kitchen HYDROcodone-acetaminophen (NORCO) 5-325 MG tablet Take 1 tablet by mouth every 4 (four) hours as needed for moderate pain. (Patient not taking: Reported on 01/12/2016) 12 tablet 0   No current facility-administered medications for this visit.     Review of Systems:  GENERAL:  Feels "good".  No fevers or sweats.  Weight up 1pound. PERFORMANCE STATUS (ECOG):  1-2 HEENT:  No visual changes, runny nose, sore throat, mouth sores or tenderness. Lungs: No shortness of breath or cough.  No hemoptysis. Cardiac:  No chest pain, palpitations, orthopnea, or PND. GI:  Diarrhea, depends on what she eats.  No nausea, vomiting, constipation, melena or hematochezia. GU:  Frequent UTIs (see HPI).  No urgency, frequency, dysuria, or hematuria. Musculoskeletal:  No back pain.  No joint pain.  No muscle tenderness. Took Zometa x 5 years. Extremities:  No pain or swelling. Skin:  Hands bruise easily.  No rashes or skin changes. Neuro:  Parkinson's disease.  No headache, numbness or weakness,or coordination issues.  Gait is unstable. Endocrine:  No diabetes, thyroid issues, hot flashes or night sweats. Psych:  No mood changes, depression or anxiety. Pain:  No focal pain. Review of systems:  All other systems reviewed and found to be negative.  Physical Exam: Blood pressure 123/74, pulse (!) 56, temperature (!) 96.7 F (35.9 C), temperature source Tympanic, resp. rate 18, weight 143 lb 1.3 oz (64.9 kg). GENERAL:  Thin elderly woman sitting comfortably in the exam room in no acute distress. MENTAL STATUS:  Alert and oriented to person,  place and time. HEAD:  Curly gray hair.  Normocephalic, atraumatic, face symmetric, no Cushingoid features. EYES:  Blue eyes.  Pupils equal round and reactive to light and accomodation.  No conjunctivitis or scleral icterus. ENT:  Oropharynx clear without lesion.  Dentures.  Tongue normal. Mucous membranes moist.  RESPIRATORY:  Clear to auscultation without rales, wheezes or rhonchi. CARDIOVASCULAR:  Regular rate and rhythm without murmur, rub or gallop. ABDOMEN:  Soft, non-tender, with active bowel sounds, and no hepatosplenomegaly.  No masses. SKIN:  No rashes, ulcers or lesions. EXTREMITIES: No edema, no skin discoloration or tenderness.  No palpable cords. LYMPH NODES: No palpable cervical, supraclavicular, axillary or inguinal adenopathy  NEUROLOGICAL: Tremor. PSYCH:  Appropriate.   Orders Only on 01/12/2016  Component Date Value Ref Range Status  . WBC 01/12/2016 7.0  3.6 - 11.0 K/uL Final  . RBC 01/12/2016 4.53  3.80 - 5.20 MIL/uL Final  .  Hemoglobin 01/12/2016 12.8  12.0 - 16.0 g/dL Final  . HCT 01/12/2016 38.9  35.0 - 47.0 % Final  . MCV 01/12/2016 85.8  80.0 - 100.0 fL Final  . MCH 01/12/2016 28.4  26.0 - 34.0 pg Final  . MCHC 01/12/2016 33.0  32.0 - 36.0 g/dL Final  . RDW 01/12/2016 14.4  11.5 - 14.5 % Final  . Platelets 01/12/2016 241  150 - 440 K/uL Final  . Neutrophils Relative % 01/12/2016 53  % Final  . Neutro Abs 01/12/2016 3.7  1.4 - 6.5 K/uL Final  . Lymphocytes Relative 01/12/2016 32  % Final  . Lymphs Abs 01/12/2016 2.2  1.0 - 3.6 K/uL Final  . Monocytes Relative 01/12/2016 12  % Final  . Monocytes Absolute 01/12/2016 0.8  0.2 - 0.9 K/uL Final  . Eosinophils Relative 01/12/2016 2  % Final  . Eosinophils Absolute 01/12/2016 0.2  0 - 0.7 K/uL Final  . Basophils Relative 01/12/2016 1  % Final  . Basophils Absolute 01/12/2016 0.1  0 - 0.1 K/uL Final  . Sodium 01/12/2016 137  135 - 145 mmol/L Final  . Potassium 01/12/2016 3.6  3.5 - 5.1 mmol/L Final  . Chloride  01/12/2016 102  101 - 111 mmol/L Final  . CO2 01/12/2016 28  22 - 32 mmol/L Final  . Glucose, Bld 01/12/2016 96  65 - 99 mg/dL Final  . BUN 01/12/2016 26* 6 - 20 mg/dL Final  . Creatinine, Ser 01/12/2016 0.69  0.44 - 1.00 mg/dL Final  . Calcium 01/12/2016 9.0  8.9 - 10.3 mg/dL Final  . Total Protein 01/12/2016 7.2  6.5 - 8.1 g/dL Final  . Albumin 01/12/2016 3.8  3.5 - 5.0 g/dL Final  . AST 01/12/2016 13* 15 - 41 U/L Final  . ALT 01/12/2016 <5* 14 - 54 U/L Final  . Alkaline Phosphatase 01/12/2016 71  38 - 126 U/L Final  . Total Bilirubin 01/12/2016 0.7  0.3 - 1.2 mg/dL Final  . GFR calc non Af Amer 01/12/2016 >60  >60 mL/min Final  . GFR calc Af Amer 01/12/2016 >60  >60 mL/min Final   Comment: (NOTE) The eGFR has been calculated using the CKD EPI equation. This calculation has not been validated in all clinical situations. eGFR's persistently <60 mL/min signify possible Chronic Kidney Disease.   . Anion gap 01/12/2016 7  5 - 15 Final  Office Visit on 01/11/2016  Component Date Value Ref Range Status  . Scan Result 01/11/2016 67   Final    Assessment:  NATAHSA MARIAN is a 82 y.o. female with stage I left breast cancer s/p partial mastectomy and sentinel lymph node biopsy on 12/08/2015.  Pathology revealed a 0.7 cm grade I  invasive mammary carcinoma of no special type.  There was no lymphvascular invasion.  Margins were clear.  One sentinel lymph node was negative.  Tumor was ER positive (> 90%), PR negative, and HER-2/neu 2+ (negative by FISH).  Pathologic stage was T1bN0.  CA 27.29 was 27.6 on 12/08/2015.  Bilateral diagnostic mammogram on 11/06/2015 revealed a suspicious 5 mm mass in the upper outer left breast.   Radiation therapy was deferred secondary to her age, Parkinson's disease, and small well differentiated ER/PR+ tumor.  She has osteoporosis and was previously on Zometa for 5 years.  Bone density study on 12/05/2015 revealed osteoporosis with a T-score of -2.9 in AP spine  L1-L4 and -2.5 in the right femoral neck.  She began Prolia on 12/19/2015  She has  Parkinson's disease.  Gait is unstable.  She has recurrent UTIs, vaginal atrophy, and incontinence.  She is using estrogen cream sparingly (once a week).  She has a history of iron deficiency.  She notes 2 colonoscopies in the past. She has been on B12 shots for years.   Symptomatically, she is doing well.  Exam reveals post-operative changes in the left breast  Plan: 1.  Labs today:  CBC with diff, CMP. 2.  Discuss radiation oncology consult. 3.  Discuss hormonal therapy.  Side effects reviewed.  Information provided. 4.  Rx: tamoxifen 20 mg po q day.  Dis: #30 with 3 refills. 5.  Nurse to call urology. 6.  RTC in 1 month for MD assessment and labs (LFTs).   Lequita Asal, MD  01/12/2016, 9:42 AM

## 2016-01-12 NOTE — Patient Instructions (Signed)
Tamoxifen oral tablet What is this medicine? TAMOXIFEN (ta MOX i fen) blocks the effects of estrogen. It is commonly used to treat breast cancer. It is also used to decrease the chance of breast cancer coming back in women who have received treatment for the disease. It may also help prevent breast cancer in women who have a high risk of developing breast cancer. This medicine may be used for other purposes; ask your health care provider or pharmacist if you have questions. COMMON BRAND NAME(S): Nolvadex What should I tell my health care provider before I take this medicine? They need to know if you have any of these conditions: -blood clots -blood disease -cataracts or impaired eyesight -endometriosis -high calcium levels -high cholesterol -irregular menstrual cycles -liver disease -stroke -uterine fibroids -an unusual reaction to tamoxifen, other medicines, foods, dyes, or preservatives -pregnant or trying to get pregnant -breast-feeding How should I use this medicine? Take this medicine by mouth with a glass of water. Follow the directions on the prescription label. You can take it with or without food. Take your medicine at regular intervals. Do not take your medicine more often than directed. Do not stop taking except on your doctor's advice. A special MedGuide will be given to you by the pharmacist with each prescription and refill. Be sure to read this information carefully each time. Talk to your pediatrician regarding the use of this medicine in children. While this drug may be prescribed for selected conditions, precautions do apply. Overdosage: If you think you have taken too much of this medicine contact a poison control center or emergency room at once. NOTE: This medicine is only for you. Do not share this medicine with others. What if I miss a dose? If you miss a dose, take it as soon as you can. If it is almost time for your next dose, take only that dose. Do not take  double or extra doses. What may interact with this medicine? Do not take this medicine with any of the following medications: -cisapride -certain medicines for irregular heart beat like dofetilide, dronedarone, quinidine -certain medicines for fungal infection like fluconazole, posaconazole -pimozide -saquinavir -thioridazine This medicine may also interact with the following medications: -aminoglutethimide -anastrozole -bromocriptine -chemotherapy drugs -female hormones, like estrogens and birth control pills -letrozole -medroxyprogesterone -phenobarbital -rifampin -warfarin This list may not describe all possible interactions. Give your health care provider a list of all the medicines, herbs, non-prescription drugs, or dietary supplements you use. Also tell them if you smoke, drink alcohol, or use illegal drugs. Some items may interact with your medicine. What should I watch for while using this medicine? Visit your doctor or health care professional for regular checks on your progress. You will need regular pelvic exams, breast exams, and mammograms. If you are taking this medicine to reduce your risk of getting breast cancer, you should know that this medicine does not prevent all types of breast cancer. If breast cancer or other problems occur, there is no guarantee that it will be found at an early stage. Do not become pregnant while taking this medicine or for 2 months after stopping this medicine. Stop taking this medicine if you get pregnant or think you are pregnant and contact your doctor. This medicine may harm your unborn baby. Women who can possibly become pregnant should use birth control methods that do not use hormones during tamoxifen treatment and for 2 months after therapy has stopped. Talk with your health care provider for birth control advice.  Do not breast feed while taking this medicine. What side effects may I notice from receiving this medicine? Side effects that  you should report to your doctor or health care professional as soon as possible: -allergic reactions like skin rash, itching or hives, swelling of the face, lips, or tongue -changes in vision -changes in your menstrual cycle -difficulty walking or talking -new breast lumps -numbness -pelvic pain or pressure -redness, blistering, peeling or loosening of the skin, including inside the mouth -signs and symptoms of a dangerous change in heartbeat or heart rhythm like chest pain, dizziness, fast or irregular heartbeat, palpitations, feeling faint or lightheaded, falls, breathing problems -sudden chest pain -swelling, pain or tenderness in your calf or leg -unusual bruising or bleeding -vaginal discharge that is bloody, brown, or rust -weakness -yellowing of the whites of the eyes or skin Side effects that usually do not require medical attention (report to your doctor or health care professional if they continue or are bothersome): -fatigue -hair loss, although uncommon and is usually mild -headache -hot flashes -impotence (in men) -nausea, vomiting (mild) -vaginal discharge (white or clear) This list may not describe all possible side effects. Call your doctor for medical advice about side effects. You may report side effects to FDA at 1-800-FDA-1088. Where should I keep my medicine? Keep out of the reach of children. Store at room temperature between 20 and 25 degrees C (68 and 77 degrees F). Protect from light. Keep container tightly closed. Throw away any unused medicine after the expiration date. NOTE: This sheet is a summary. It may not cover all possible information. If you have questions about this medicine, talk to your doctor, pharmacist, or health care provider.  2017 Elsevier/Gold Standard (2015-08-04 07:27:41)

## 2016-01-12 NOTE — Telephone Encounter (Signed)
Judeen Hammans from the [facility] called stating pt is seeing Dr. Mike Gip for her breast cancer. Dr. Mike Gip wanted to start the pt on tamoxifen for the breast cancer. Myrbetriq decreases the effectiveness of tamoxifen. Sherry and pt requested another pt be rx for OAB. Per Judeen Hammans from [place] says Lisbeth Ply is ok but vesicare causes interactions also. Please advise.

## 2016-01-12 NOTE — Telephone Encounter (Signed)
Dr Mike Gip wants pt to start tamoxifen and when she entered the drug it came up with a adverse event with mybetriq that it can cause the tamoxifen to decrease its function while on mybetriq.  I called urology and spoke to [facility] the nurse for [facility] and told her the problem and I went on up to date and put in [place] and it does not have any issues with drug interaction and she will check with Larene [place] and call family back first of next week with response and I left this info on voicemail for the pt's son.

## 2016-01-12 NOTE — Telephone Encounter (Signed)
LMOM

## 2016-01-12 NOTE — Progress Notes (Signed)
Patient offers no complaints today. 

## 2016-01-14 ENCOUNTER — Other Ambulatory Visit: Payer: Self-pay | Admitting: *Deleted

## 2016-01-14 DIAGNOSIS — Z170 Estrogen receptor positive status [ER+]: Principal | ICD-10-CM

## 2016-01-14 DIAGNOSIS — C50412 Malignant neoplasm of upper-outer quadrant of left female breast: Secondary | ICD-10-CM

## 2016-01-15 NOTE — Telephone Encounter (Signed)
Spoke with pt in reference to Wilsey samples. Pt voiced understanding. Pt stated she would have granddaughter pick up medication.

## 2016-02-09 ENCOUNTER — Inpatient Hospital Stay: Payer: Medicare Other | Attending: Hematology and Oncology

## 2016-02-09 ENCOUNTER — Encounter: Payer: Self-pay | Admitting: Hematology and Oncology

## 2016-02-09 ENCOUNTER — Inpatient Hospital Stay (HOSPITAL_BASED_OUTPATIENT_CLINIC_OR_DEPARTMENT_OTHER): Payer: Medicare Other | Admitting: Hematology and Oncology

## 2016-02-09 VITALS — BP 135/85 | HR 69 | Temp 96.8°F | Resp 18 | Wt 143.1 lb

## 2016-02-09 DIAGNOSIS — C50412 Malignant neoplasm of upper-outer quadrant of left female breast: Secondary | ICD-10-CM | POA: Insufficient documentation

## 2016-02-09 DIAGNOSIS — Z79899 Other long term (current) drug therapy: Secondary | ICD-10-CM

## 2016-02-09 DIAGNOSIS — R32 Unspecified urinary incontinence: Secondary | ICD-10-CM | POA: Insufficient documentation

## 2016-02-09 DIAGNOSIS — Z170 Estrogen receptor positive status [ER+]: Secondary | ICD-10-CM

## 2016-02-09 DIAGNOSIS — M1990 Unspecified osteoarthritis, unspecified site: Secondary | ICD-10-CM

## 2016-02-09 DIAGNOSIS — R232 Flushing: Secondary | ICD-10-CM | POA: Insufficient documentation

## 2016-02-09 DIAGNOSIS — M810 Age-related osteoporosis without current pathological fracture: Secondary | ICD-10-CM | POA: Insufficient documentation

## 2016-02-09 DIAGNOSIS — I10 Essential (primary) hypertension: Secondary | ICD-10-CM | POA: Diagnosis not present

## 2016-02-09 DIAGNOSIS — Z87440 Personal history of urinary (tract) infections: Secondary | ICD-10-CM | POA: Diagnosis not present

## 2016-02-09 DIAGNOSIS — Z79810 Long term (current) use of selective estrogen receptor modulators (SERMs): Secondary | ICD-10-CM

## 2016-02-09 DIAGNOSIS — G20 Parkinson's disease: Secondary | ICD-10-CM

## 2016-02-09 LAB — BASIC METABOLIC PANEL
ANION GAP: 8 (ref 5–15)
BUN: 19 mg/dL (ref 6–20)
CHLORIDE: 102 mmol/L (ref 101–111)
CO2: 31 mmol/L (ref 22–32)
Calcium: 10.2 mg/dL (ref 8.9–10.3)
Creatinine, Ser: 0.9 mg/dL (ref 0.44–1.00)
GFR calc non Af Amer: 55 mL/min — ABNORMAL LOW (ref >60)
Glucose, Bld: 91 mg/dL (ref 65–99)
POTASSIUM: 3.4 mmol/L — AB (ref 3.5–5.1)
Sodium: 141 mmol/L (ref 135–145)

## 2016-02-09 LAB — HEPATIC FUNCTION PANEL
ALT: 6 U/L — ABNORMAL LOW (ref 14–54)
AST: 18 U/L (ref 15–41)
Albumin: 4 g/dL (ref 3.5–5.0)
Alkaline Phosphatase: 57 U/L (ref 38–126)
Bilirubin, Direct: <0.1 mg/dL — ABNORMAL LOW (ref 0.1–0.5)
Total Bilirubin: 0.2 mg/dL — ABNORMAL LOW (ref 0.3–1.2)
Total Protein: 7.7 g/dL (ref 6.5–8.1)

## 2016-02-09 NOTE — Progress Notes (Signed)
[facility] day:  02/09/2016   Chief Complaint: Laura Cisneros is a 83 y.o. female with clinical stage I left breast cancer who is seen for 1 month assessment on tamoxifen.  HPI: The patient was last seen in the medical oncology clinic on 01/12/2016.  At that time, she was doing well.  Radiation therapy had deferred radiation based on Parkinson's disease, small well-differentiated grade I tumor, and ER/PR positivity.  We discussed Tamoxifen secondary to her osteoporosis.  She began tamoxifen.  Her mirabegron was switched to Toviaz (fesoterodine) by urology secondary to mirabegron's interaction with tamoxifen (CYP2D6 inhibitor).  Symptomatically, she has done well on tamoxifen.  She notes a few hot flashes.   Past Medical History:  Diagnosis Date  . Anemia   . Arthritis    osteoarthritis  . Atrophic vaginitis   . Cancer ([place])    Breast  . Cataract   . Diverticulosis   . Hiatal hernia   . Hypertension   . Incontinence   . Osteoporosis   . Parkinson disease ([place])   . Vitamin B12 deficiency     Past Surgical History:  Procedure Laterality Date  . ABDOMINAL HYSTERECTOMY    . ABDOMINAL HYSTERECTOMY    . BREAST BIOPSY Left 10/2012   benign  . BREAST BIOPSY Left 11/22/2015   invasive mammary ca   . BREAST EXCISIONAL BIOPSY Left 12/08/2015   lumpectomy  . EYE SURGERY Bilateral    Catarct Extraction with IOL  . FOOT FRACTURE SURGERY Right    pinning, Dr. Francia Greaves, [facility]  . INCONTINENCE SURGERY    . PARTIAL MASTECTOMY WITH AXILLARY SENTINEL LYMPH NODE BIOPSY Left 12/08/2015   Procedure: PARTIAL MASTECTOMY WITH AXILLARY SENTINEL LYMPH NODE BIOPSY;  Surgeon: [name], MD;  Location: [facility] ORS;  Service: General;  Laterality: Left;    Family History  Problem Relation Age of Onset  . Kidney disease Neg Hx   . Bladder Cancer Neg Hx   . Breast cancer Neg Hx     Social History:  reports that she has never smoked. She has never  used smokeless tobacco. She reports that she does not drink alcohol or use drugs.  She denies any exposure to radiation or toxins.  She previously worked in a [employer].  Her son's phone number is [phone].  The patient is accompanied by her son, Laura Cisneros, and grand daughter, Laura Cisneros, today.  Allergies: No Known Allergies  Current Medications: Current Outpatient Prescriptions  Medication Sig Dispense Refill  . acetaminophen (TYLENOL) 500 MG tablet Take 500 mg by mouth every 6 (six) hours as needed (pain).    . carbidopa-levodopa (SINEMET CR) 50-200 MG tablet Take 3 tablets by mouth 2 (two) times daily.    Marland Kitchen conjugated estrogens (PREMARIN) vaginal cream Place 1 Applicatorful vaginally daily. Apply 0.'5mg'$  (pea-sized amount)  just inside the vaginal introitus with a finger-tip every night for two weeks and then Monday, Wednesday and Friday nights. (Patient taking differently: Place 1 g vaginally. ) 30 g 12  . cyanocobalamin (,VITAMIN B-12,) 1000 MCG/ML injection INJECT 1ML INTO MUSCLE ONCE A MONTH AS DIRECTED    . fesoterodine (TOVIAZ) 4 MG TB24 tablet Take 4 mg by mouth daily.    . hydrochlorothiazide (MICROZIDE) 12.5 MG capsule Take 12.5 mg by mouth daily.    . hydroxypropyl methylcellulose / hypromellose (ISOPTO TEARS / GONIOVISC) 2.5 % ophthalmic solution Place 1 drop into both eyes 3 (three) times daily as needed for dry eyes.    Marland Kitchen  metoprolol succinate (TOPROL-XL) 50 MG 24 hr tablet Take 50 mg by mouth daily.     . potassium chloride (K-DUR) 10 MEQ tablet Take by mouth.    . tamoxifen (NOLVADEX) 20 MG tablet Take 1 tablet (20 mg total) by mouth daily. 30 tablet 3  . estradiol (ESTRACE) 0.1 MG/GM vaginal cream Place 1 Applicatorful vaginally once a week.    Marland Kitchen HYDROcodone-acetaminophen (NORCO) 5-325 MG tablet Take 1 tablet by mouth every 4 (four) hours as needed for moderate pain. (Patient not taking: Reported on 02/09/2016) 12 tablet 0  . mirabegron ER (MYRBETRIQ) 25 MG TB24 tablet Take 1 tablet (25 mg  total) by mouth daily. (Patient not taking: Reported on 02/09/2016) 90 tablet 4   No current facility-administered medications for this visit.     Review of Systems:  GENERAL:  Feels "fine".  No fevers or sweats.  Weight stable. PERFORMANCE STATUS (ECOG):  1-2 HEENT:  No visual changes, runny nose, sore throat, mouth sores or tenderness. Lungs: No shortness of breath or cough.  No hemoptysis. Cardiac:  No chest pain, palpitations, orthopnea, or PND. GI:  Diarrhea, depends on what she eats.  No nausea, vomiting, constipation, melena or hematochezia. GU:  No urgency, frequency, dysuria, or hematuria. h/o recurrent UTIs and vaginal atrophy on estrogen cream sparingly. Musculoskeletal:  No back pain.  No joint pain.  No muscle tenderness. Took Zometa x 5 years. Extremities:  No pain or swelling. Skin:  Hands bruise easily.  No rashes or skin changes. Neuro:  Parkinson's disease.  No headache, numbness or weakness,or coordination issues.  Gait is unstable. Endocrine:  No diabetes, thyroid issues, hot flashes or night sweats. Psych:  No mood changes, depression or anxiety. Pain:  No focal pain. Review of systems:  All other systems reviewed and found to be negative.  Physical Exam: Blood pressure 135/85, pulse 69, temperature (!) 96.8 F (36 C), temperature source Tympanic, resp. rate 18, weight 143 lb 1.3 oz (64.9 kg). GENERAL:  Thin elderly woman sitting comfortably in the exam room in no acute distress. HEAD:  Curly gray hair.  Normocephalic, atraumatic, face symmetric, no Cushingoid features. EYES:Blueeyes. Pupils equal round and reactive to light and accomodation. No conjunctivitis or scleral icterus. RESPIRATORY:Clear to auscultationwithout rales, wheezes or rhonchi. CARDIOVASCULAR:Regular rate andrhythmwithout murmur, rub or gallop. ABDOMEN:Soft, non-tender, with active bowel sounds, and no hepatosplenomegaly. No masses. SKIN: No rashes, ulcers or lesions. EXTREMITIES:  No edema, no skin discoloration or tenderness. No palpable cords. LYMPHNODES: No palpable cervical, supraclavicular, axillary or inguinal adenopathy  NEUROLOGICAL: Tremor. PSYCH: Appropriate.   Appointment on 02/09/2016  Component Date Value Ref Range Status  . Total Protein 02/09/2016 7.7  6.5 - 8.1 g/dL Final  . Albumin 02/09/2016 4.0  3.5 - 5.0 g/dL Final  . AST 02/09/2016 18  15 - 41 U/L Final  . ALT 02/09/2016 6* 14 - 54 U/L Final  . Alkaline Phosphatase 02/09/2016 57  38 - 126 U/L Final  . Total Bilirubin 02/09/2016 0.2* 0.3 - 1.2 mg/dL Final  . Bilirubin, Direct 02/09/2016 <0.1* 0.1 - 0.5 mg/dL Final  . Indirect Bilirubin 02/09/2016 NOT CALCULATED  0.3 - 0.9 mg/dL Final    Assessment:  YARED SUSAN is a 83 y.o. female with stage I left breast cancer s/p partial mastectomy and sentinel lymph node biopsy on 12/08/2015.  Pathology revealed a 0.7 cm grade I  invasive mammary carcinoma of no special type.  There was no lymphvascular invasion.  Margins were clear.  One sentinel  lymph node was negative.  Tumor was ER positive (> 90%), PR negative, and HER-2/neu 2+ (negative by FISH).  Pathologic stage was T1bN0.  CA 27.29 was 27.6 on 12/08/2015.  Bilateral diagnostic mammogram on 11/06/2015 revealed a suspicious 5 mm mass in the upper outer left breast.   Radiation therapy was deferred secondary to her age, Parkinson's disease, and small well differentiated ER/PR+ tumor.  She began tamoxifen on 01/12/2016.  She is tolerating it well.  She has osteoporosis and was previously on Zometa for 5 years.  Bone density study on 12/05/2015 revealed osteoporosis with a T-score of -2.9 in AP spine L1-L4 and -2.5 in the right femoral neck.  She began Prolia on 12/19/2015  She has Parkinson's disease.  Gait is unstable.  She has a history of recurrent UTIs, vaginal atrophy, and incontinence.  She is using estrogen cream sparingly (once a week).  She has a history of iron deficiency.  She notes 2  colonoscopies in the past. She has been on B12 shots for years.   Symptomatically, she is doing well.  Exam reveals post-operative changes in the left breast  Plan: 1.  Labs today:  CMP. 2.  Continue tamoxifen. 3.  RTC in 3 months for MD assess and labs (CBC with diff, CMp, CA27.29).   Lequita Asal, MD  02/09/2016, 3:54 PM

## 2016-02-14 ENCOUNTER — Ambulatory Visit: Payer: Medicare Other | Admitting: Urology

## 2016-02-16 ENCOUNTER — Other Ambulatory Visit: Payer: Self-pay

## 2016-02-16 DIAGNOSIS — Z170 Estrogen receptor positive status [ER+]: Principal | ICD-10-CM

## 2016-02-16 DIAGNOSIS — C50412 Malignant neoplasm of upper-outer quadrant of left female breast: Secondary | ICD-10-CM

## 2016-02-16 MED ORDER — FESOTERODINE FUMARATE ER 4 MG PO TB24: 4.0000 mg | ORAL_TABLET | Freq: Every day | ORAL | 3 refills | Status: DC

## 2016-02-27 ENCOUNTER — Encounter: Payer: Self-pay | Admitting: Urology

## 2016-02-27 ENCOUNTER — Ambulatory Visit (INDEPENDENT_AMBULATORY_CARE_PROVIDER_SITE_OTHER): Payer: Medicare Other | Admitting: Urology

## 2016-02-27 VITALS — BP 159/84 | HR 71 | Ht 59.0 in | Wt 144.6 lb

## 2016-02-27 DIAGNOSIS — Z170 Estrogen receptor positive status [ER+]: Secondary | ICD-10-CM | POA: Diagnosis not present

## 2016-02-27 DIAGNOSIS — N390 Urinary tract infection, site not specified: Secondary | ICD-10-CM | POA: Diagnosis not present

## 2016-02-27 DIAGNOSIS — C50412 Malignant neoplasm of upper-outer quadrant of left female breast: Secondary | ICD-10-CM

## 2016-02-27 DIAGNOSIS — N952 Postmenopausal atrophic vaginitis: Secondary | ICD-10-CM

## 2016-02-27 DIAGNOSIS — R32 Unspecified urinary incontinence: Secondary | ICD-10-CM

## 2016-02-27 LAB — BLADDER SCAN AMB NON-IMAGING: Scan Result: 84

## 2016-02-27 MED ORDER — FESOTERODINE FUMARATE ER 4 MG PO TB24: 4.0000 mg | ORAL_TABLET | Freq: Every day | ORAL | 11 refills | Status: DC

## 2016-02-27 NOTE — Progress Notes (Signed)
02/27/2016 3:26 PM   Laura Cisneros 05-05-1925 [id number]  Referring provider: Leonel Ramsay, MD [place], [place] [address]  Chief Complaint  Patient presents with  . Urinary Incontinence    Med check after switching from Myrbetriq to Toviaz 1 month follow up    HPI: Patient is an 83 year old Caucasian female who presents today for a one month follow up for incontinence, vaginal atrophy and a history of recurrent UTI who needed to switch from Myrbetriq to [place] as it interfere with tamoxifen.    Background history Patient was referred by her PCP, Dr. Ola Spurr, for urinary frequency and recurrent UTI's.  Patient states that she has had four urinary tract infections over the last year.  Her symptoms with a urinary tract infection consist of frequency, dysuria, urgency, low back pain and incontinence.  She denies gross hematuria, suprapubic pain, abdominal pain or flank pain.  She has not had any recent fevers, chills, nausea or vomiting.   She does not have a history of nephrolithiasis, GU surgery or GU trauma.   Reviewing her records,  she has had three documented UTI's over the last year.  +  Variable resistance  E. Coli on 01/24/2015  +  Variable resistance E. Coli on 06/28/2015  +  Variable resistance E. Coli on 08/07/2015  +  Pan sensitive E. Coli on 09/06/2015  She is not sexually active.   She is post menopausal.  She admits to diarrhea.  She does engage in good perineal hygiene. She does not take tub baths.  She does  have incontinence.  She is using incontinence pads. Four pads daily.  PVR was 0 mL.  She underwent a non contrast CT in 2015 which did not identify any urinary tract stones, obstruction or other acute abnormality.    She is drinking a lot of water daily, taking cranberry tablets and taking vitamin C.    Patient has no bother with urgency, incontinence or night time urination.  She denies any  gross hematuria, dysuria or suprapubic pain.  He has not had fevers, chills, nausea or vomiting.   Her frequency is reduced greatly and she is no longer wearing pads, except when she travels long distances.   She needed to be changed from Myrbetiq to [place] due to the interference of the Myrbetriq with the Tamoxifen.  She states that she has noticed no difference since the switch.  She would like to continue the [place].  She denies dry mouth and constipation.  She denies dysuria, gross hematuria and suprapubic pain.  She denies fevers, chills, nausea and vomiting.  Her PVR today is 84 mL.     PMH: Past Medical History:  Diagnosis Date  . Anemia   . Arthritis    osteoarthritis  . Atrophic vaginitis   . Cancer ([place])    Breast  . Cataract   . Diverticulosis   . Hiatal hernia   . Hypertension   . Incontinence   . Osteoporosis   . Parkinson disease ([place])   . Vitamin B12 deficiency     Surgical History: Past Surgical History:  Procedure Laterality Date  . ABDOMINAL HYSTERECTOMY    . ABDOMINAL HYSTERECTOMY    . BREAST BIOPSY Left 10/2012   benign  . BREAST BIOPSY Left 11/22/2015   invasive mammary ca   . BREAST EXCISIONAL BIOPSY Left 12/08/2015   lumpectomy  . EYE SURGERY Bilateral    Catarct  Extraction with IOL  . FOOT FRACTURE SURGERY Right    pinning, Dr[facility] Francia [facility], [facility]  . INCONTINENCE SURGERY    . PARTIAL MASTECTOMY WITH AXILLARY SENTINEL LYMPH NODE BIOPSY Left 12/08/2015   Procedure: PARTIAL MASTECTOMY WITH AXILLARY SENTINEL LYMPH NODE BIOPSY;  Surgeon: Laura Green, MD;  Location: [facility] ORS;  Service: General;  Laterality: Left;    Home Medications:  Allergies as of 02/27/2016   No Known Allergies     Medication List       Accurate as of 02/27/16  3:26 PM. Always use your most recent med list.          acetaminophen 500 MG tablet Commonly known as:  TYLENOL Take 500 mg by mouth every 6 (six) hours as needed (pain).   carbidopa-levodopa 50-200 MG  tablet Commonly known as:  SINEMET CR Take 3 tablets by mouth 2 (two) times daily.   conjugated estrogens vaginal cream Commonly known as:  PREMARIN Place 1 Applicatorful vaginally daily. Apply 0.5mg  (pea-sized amount)  just inside the vaginal introitus with a finger-tip every night for two weeks and then Monday, Wednesday and Friday nights.   cyanocobalamin 1000 MCG/ML injection Commonly known as:  (VITAMIN B-12) INJECT 1ML INTO MUSCLE ONCE A MONTH AS DIRECTED   estradiol 0.1 MG/GM vaginal cream Commonly known as:  ESTRACE Place 1 Applicatorful vaginally once a week.   fesoterodine 4 MG Tb24 tablet Commonly known as:  TOVIAZ Take 1 tablet (4 mg total) by mouth daily.   hydrochlorothiazide 12.5 MG capsule Commonly known as:  MICROZIDE Take 12.5 mg by mouth daily.   HYDROcodone-acetaminophen 5-325 MG tablet Commonly known as:  NORCO Take 1 tablet by mouth every 4 (four) hours as needed for moderate pain.   hydroxypropyl methylcellulose / hypromellose 2.5 % ophthalmic solution Commonly known as:  ISOPTO TEARS / GONIOVISC Place 1 drop into both eyes 3 (three) times daily as needed for dry eyes.   metoprolol succinate 50 MG 24 hr tablet Commonly known as:  TOPROL-XL Take 50 mg by mouth daily.   mirabegron ER 25 MG Tb24 tablet Commonly known as:  MYRBETRIQ Take 1 tablet (25 mg total) by mouth daily.   potassium chloride 10 MEQ tablet Commonly known as:  K-DUR Take by mouth.   tamoxifen 20 MG tablet Commonly known as:  NOLVADEX Take 1 tablet (20 mg total) by mouth daily.       Allergies: No Known Allergies  Family History: Family History  Problem Relation Age of Onset  . Kidney disease Neg Hx   . Bladder Cancer Neg Hx   . Breast cancer Neg Hx     Social History:  reports that she has never smoked. She has never used smokeless tobacco. She reports that she does not drink alcohol or use drugs.  ROS: UROLOGY Frequent Urination?: No Hard to postpone urination?:  No Burning/pain with urination?: No Get up at night to urinate?: No Leakage of urine?: No Urine stream starts and stops?: No Trouble starting stream?: No Do you have to strain to urinate?: No Blood in urine?: No Urinary tract infection?: No Sexually transmitted disease?: No Injury to kidneys or bladder?: No Painful intercourse?: No Weak stream?: No Currently pregnant?: No Vaginal bleeding?: No Last menstrual period?: n  Gastrointestinal Nausea?: No Vomiting?: No Indigestion/heartburn?: No Diarrhea?: No Constipation?: No  Constitutional Fever: No Night sweats?: No Weight loss?: No Fatigue?: Yes  Skin Skin rash/lesions?: No Itching?: No  Eyes Blurred vision?: No Double vision?: No  Ears/Nose/Throat Sore  throat?: No Sinus problems?: No  Hematologic/Lymphatic Swollen glands?: No Easy bruising?: No  Cardiovascular Leg swelling?: No Chest pain?: No  Respiratory Cough?: No Shortness of breath?: No  Endocrine Excessive thirst?: No  Musculoskeletal Back pain?: No Joint pain?: Yes  Neurological Headaches?: No Dizziness?: Yes  Psychologic Depression?: No Anxiety?: No  Physical Exam: BP (!) 159/84   Pulse 71   Ht 4\' 11"  (1.499 m)   Wt 144 lb 9.6 oz (65.6 kg)   BMI 29.21 kg/m   Constitutional: Well nourished. Alert and oriented, No acute distress. HEENT: [place] AT, moist mucus membranes. Trachea midline, no masses. Cardiovascular: No clubbing, cyanosis, or edema. Respiratory: Normal respiratory effort, no increased work of breathing. Skin: No rashes, bruises or suspicious lesions. Lymph: No cervical or inguinal adenopathy. Neurologic: Grossly intact, no focal deficits, moving all 4 extremities. Psychiatric: Normal mood and affect.  Laboratory Data: Lab Results  Component Value Date   WBC 7.0 01/12/2016   HGB 12.8 01/12/2016   HCT 38.9 01/12/2016   MCV 85.8 01/12/2016   PLT 241 01/12/2016    Lab Results  Component Value Date    CREATININE 0.90 02/09/2016    Lab Results  Component Value Date   AST 18 02/09/2016   Lab Results  Component Value Date   ALT 6 (L) 02/09/2016   Pertinent Imaging: Results for MARJI, MENKE (MRN [id number]) as of 02/27/2016 15:17  Ref. Range 02/27/2016 15:01  Scan Result Unknown 84   Assessment & Plan:    1. Recurrent UTI's  - reviewed UTI prevention  - remind patient to contact our office when she has symptoms of an UTI  2. Incontinence  - PVR was 84 mL  - continue estrogen cream; once weekly  - continue Toviaz 4 mg daily  - present to clinic in 12 months for PVR and symptom recheck   3. Vaginal atrophy  - continue applying the cream once weekly  - RTC in 12 months for an exam  Return in about 1 year (around 02/26/2017) for PVR, exam and OAB questionnaire.  These notes generated with voice recognition software. I apologize for typographical errors.  Zara Council, [facility] [address], [place], [place] [address] [phone]

## 2016-03-29 ENCOUNTER — Encounter: Payer: Self-pay | Admitting: Hematology and Oncology

## 2016-04-20 ENCOUNTER — Other Ambulatory Visit: Payer: Self-pay | Admitting: Hematology and Oncology

## 2016-05-10 ENCOUNTER — Other Ambulatory Visit: Payer: Medicare Other

## 2016-05-10 ENCOUNTER — Ambulatory Visit: Payer: Medicare Other | Admitting: Hematology and Oncology

## 2016-05-17 ENCOUNTER — Encounter: Payer: Self-pay | Admitting: Hematology and Oncology

## 2016-05-17 ENCOUNTER — Inpatient Hospital Stay: Payer: Medicare Other | Attending: Hematology and Oncology

## 2016-05-17 ENCOUNTER — Other Ambulatory Visit: Payer: Self-pay | Admitting: *Deleted

## 2016-05-17 ENCOUNTER — Telehealth: Payer: Self-pay | Admitting: *Deleted

## 2016-05-17 ENCOUNTER — Inpatient Hospital Stay (HOSPITAL_BASED_OUTPATIENT_CLINIC_OR_DEPARTMENT_OTHER): Payer: Medicare Other | Admitting: Hematology and Oncology

## 2016-05-17 VITALS — BP 122/90 | HR 116 | Temp 97.0°F | Ht 59.0 in | Wt 144.2 lb

## 2016-05-17 DIAGNOSIS — E538 Deficiency of other specified B group vitamins: Secondary | ICD-10-CM | POA: Diagnosis not present

## 2016-05-17 DIAGNOSIS — Z87440 Personal history of urinary (tract) infections: Secondary | ICD-10-CM

## 2016-05-17 DIAGNOSIS — Z853 Personal history of malignant neoplasm of breast: Secondary | ICD-10-CM

## 2016-05-17 DIAGNOSIS — G20 Parkinson's disease: Secondary | ICD-10-CM

## 2016-05-17 DIAGNOSIS — Z79899 Other long term (current) drug therapy: Secondary | ICD-10-CM

## 2016-05-17 DIAGNOSIS — M810 Age-related osteoporosis without current pathological fracture: Secondary | ICD-10-CM | POA: Insufficient documentation

## 2016-05-17 DIAGNOSIS — I10 Essential (primary) hypertension: Secondary | ICD-10-CM | POA: Insufficient documentation

## 2016-05-17 DIAGNOSIS — C50412 Malignant neoplasm of upper-outer quadrant of left female breast: Secondary | ICD-10-CM | POA: Insufficient documentation

## 2016-05-17 DIAGNOSIS — R32 Unspecified urinary incontinence: Secondary | ICD-10-CM | POA: Insufficient documentation

## 2016-05-17 DIAGNOSIS — Z170 Estrogen receptor positive status [ER+]: Secondary | ICD-10-CM

## 2016-05-17 DIAGNOSIS — Z79810 Long term (current) use of selective estrogen receptor modulators (SERMs): Secondary | ICD-10-CM | POA: Diagnosis not present

## 2016-05-17 DIAGNOSIS — R5383 Other fatigue: Secondary | ICD-10-CM | POA: Insufficient documentation

## 2016-05-17 DIAGNOSIS — M1990 Unspecified osteoarthritis, unspecified site: Secondary | ICD-10-CM | POA: Insufficient documentation

## 2016-05-17 LAB — CBC WITH DIFFERENTIAL/PLATELET
Basophils Absolute: 0.1 10*3/uL (ref 0–0.1)
Basophils Relative: 1 %
Eosinophils Absolute: 0.1 10*3/uL (ref 0–0.7)
Eosinophils Relative: 2 %
HCT: 37.1 % (ref 35.0–47.0)
Hemoglobin: 12.6 g/dL (ref 12.0–16.0)
Lymphocytes Relative: 32 %
Lymphs Abs: 2.3 10*3/uL (ref 1.0–3.6)
MCH: 30.4 pg (ref 26.0–34.0)
MCHC: 33.9 g/dL (ref 32.0–36.0)
MCV: 89.6 fL (ref 80.0–100.0)
Monocytes Absolute: 0.8 10*3/uL (ref 0.2–0.9)
Monocytes Relative: 11 %
Neutro Abs: 3.8 10*3/uL (ref 1.4–6.5)
Neutrophils Relative %: 54 %
Platelets: 259 10*3/uL (ref 150–440)
RBC: 4.14 MIL/uL (ref 3.80–5.20)
RDW: 13.9 % (ref 11.5–14.5)
WBC: 7 10*3/uL (ref 3.6–11.0)

## 2016-05-17 LAB — COMPREHENSIVE METABOLIC PANEL
ALT: <5 U/L — ABNORMAL LOW (ref 14–54)
AST: 13 U/L — ABNORMAL LOW (ref 15–41)
Albumin: 3.6 g/dL (ref 3.5–5.0)
Alkaline Phosphatase: 52 U/L (ref 38–126)
Anion gap: 8 (ref 5–15)
BUN: 22 mg/dL — ABNORMAL HIGH (ref 6–20)
CO2: 27 mmol/L (ref 22–32)
Calcium: 9.7 mg/dL (ref 8.9–10.3)
Chloride: 102 mmol/L (ref 101–111)
Creatinine, Ser: 0.95 mg/dL (ref 0.44–1.00)
GFR calc Af Amer: 60 mL/min — ABNORMAL LOW (ref >60)
GFR calc non Af Amer: 52 mL/min — ABNORMAL LOW (ref >60)
Glucose, Bld: 117 mg/dL — ABNORMAL HIGH (ref 65–99)
Potassium: 3.5 mmol/L (ref 3.5–5.1)
Sodium: 137 mmol/L (ref 135–145)
Total Bilirubin: 0.5 mg/dL (ref 0.3–1.2)
Total Protein: 6.9 g/dL (ref 6.5–8.1)

## 2016-05-17 NOTE — Progress Notes (Signed)
[facility] day:  05/17/2016   Chief Complaint: Laura Cisneros is a 83 y.o. female with clinical stage I left breast cancer who is seen for 3 month assessment on tamoxifen.  HPI: The patient was last seen in the medical oncology clinic on 02/09/2016.  At that time, she was doing well.  Exam revealed post-operative changes in the left breast  Symptomatically, she is doing "alright". She tires easily. Her son notes that she is "shaking a little more". She is unstable walking. She is Parkinson's.   Past Medical History:  Diagnosis Date  . Anemia   . Arthritis    osteoarthritis  . Atrophic vaginitis   . Cancer ([place])    Breast  . Cataract   . Diverticulosis   . Hiatal hernia   . Hypertension   . Incontinence   . Osteoporosis   . Parkinson disease ([place])   . Vitamin B12 deficiency     Past Surgical History:  Procedure Laterality Date  . ABDOMINAL HYSTERECTOMY    . ABDOMINAL HYSTERECTOMY    . BREAST BIOPSY Left 10/2012   benign  . BREAST BIOPSY Left 11/22/2015   invasive mammary ca   . BREAST EXCISIONAL BIOPSY Left 12/08/2015   lumpectomy  . EYE SURGERY Bilateral    Catarct Extraction with IOL  . FOOT FRACTURE SURGERY Right    pinning, Dr. Francia Greaves, [facility]  . INCONTINENCE SURGERY    . PARTIAL MASTECTOMY WITH AXILLARY SENTINEL LYMPH NODE BIOPSY Left 12/08/2015   Procedure: PARTIAL MASTECTOMY WITH AXILLARY SENTINEL LYMPH NODE BIOPSY;  Surgeon: [name], MD;  Location: [facility] ORS;  Service: General;  Laterality: Left;    Family History  Problem Relation Age of Onset  . Kidney disease Neg Hx   . Bladder Cancer Neg Hx   . Breast cancer Neg Hx     Social History:  reports that she has never smoked. She has never used smokeless tobacco. She reports that she does not drink alcohol or use drugs.  She denies any exposure to radiation or toxins.  She previously worked in a [employer].  Her son's phone number is [phone].  Her grand  daughter is [name].  The patient is accompanied by her son, [name].  Allergies: No Known Allergies  Current Medications: Current Outpatient Prescriptions  Medication Sig Dispense Refill  . acetaminophen (TYLENOL) 500 MG tablet Take 500 mg by mouth every 6 (six) hours as needed (pain).    . carbidopa-levodopa (SINEMET CR) 50-200 MG tablet Take 3 tablets by mouth 2 (two) times daily.    Marland Kitchen conjugated estrogens (PREMARIN) vaginal cream Place 1 Applicatorful vaginally daily. Apply 0.24m (pea-sized amount)  just inside the vaginal introitus with a finger-tip every night for two weeks and then Monday, Wednesday and Friday nights. 30 g 12  . cyanocobalamin (,VITAMIN B-12,) 1000 MCG/ML injection INJECT 1ML INTO MUSCLE ONCE A MONTH AS DIRECTED    . estradiol (ESTRACE) 0.1 MG/GM vaginal cream Place 1 Applicatorful vaginally once a week.    . fesoterodine (TOVIAZ) 4 MG TB24 tablet Take 1 tablet (4 mg total) by mouth daily. 30 tablet 11  . hydrochlorothiazide (MICROZIDE) 12.5 MG capsule Take 12.5 mg by mouth daily.    .Marland KitchenHYDROcodone-acetaminophen (NORCO) 5-325 MG tablet Take 1 tablet by mouth every 4 (four) hours as needed for moderate pain. 12 tablet 0  . hydroxypropyl methylcellulose / hypromellose (ISOPTO TEARS / GONIOVISC) 2.5 % ophthalmic solution Place 1 drop into both eyes  3 (three) times daily as needed for dry eyes.    . metoprolol succinate (TOPROL-XL) 50 MG 24 hr tablet Take 50 mg by mouth daily.     . mirabegron ER (MYRBETRIQ) 25 MG TB24 tablet Take 1 tablet (25 mg total) by mouth daily. 90 tablet 4  . potassium chloride (K-DUR) 10 MEQ tablet Take by mouth.    . tamoxifen (NOLVADEX) 20 MG tablet TAKE ONE (1) TABLET EACH DAY 30 tablet 3   No current facility-administered medications for this visit.     Review of Systems:  GENERAL:  Feels "alright".  Tires easily.  No fevers or sweats.  Weight up 1 pound. PERFORMANCE STATUS (ECOG):  2 HEENT:  No visual changes, runny nose, sore throat, mouth  sores or tenderness. Lungs: No shortness of breath or cough.  No hemoptysis. Cardiac:  No chest pain, palpitations, orthopnea, or PND. GI:  Diarrhea, depends on what she eats.  No nausea, vomiting, constipation, melena or hematochezia. GU:  No urgency, frequency, dysuria, or hematuria. h/o recurrent UTIs and vaginal atrophy on estrogen cream sparingly. Musculoskeletal:  No back pain.  No joint pain.  No muscle tenderness. Took Zometa x 5 years. Extremities:  No pain or swelling. Skin:  No rashes or skin changes. Neuro:  Parkinson's disease.  No headache, numbness or weakness,or coordination issues.  Gait is unstable. Endocrine:  No diabetes, thyroid issues, hot flashes or night sweats. Psych:  No mood changes, depression or anxiety. Pain:  No focal pain. Review of systems:  All other systems reviewed and found to be negative.  Physical Exam: Blood pressure 122/90, pulse (!) 116, temperature 97 F (36.1 C), temperature source Tympanic, height _0  (1.499 m), weight 144 lb 3.2 oz (65.4 kg). GENERAL:  Thin elderly woman sitting comfortably in the exam room in no acute distress. HEAD:  Curly gray hair.  Normocephalic, atraumatic, face symmetric, no Cushingoid features. EYES:Blueeyes. Pupils equal round and reactive to light and accomodation. No conjunctivitis or scleral icterus. RESPIRATORY:Clear to auscultationwithout rales, wheezes or rhonchi. CARDIOVASCULAR:Regular rate andrhythmwithout murmur, rub or gallop. BREAST:  Right breast without masses, skin changes or nipple discharge.  Left breast with well healed lateral incision.  No skin changes or nipple discharge.  ABDOMEN:Soft, non-tender, with active bowel sounds, and no hepatosplenomegaly. No masses. SKIN: No rashes, ulcers or lesions. EXTREMITIES: No edema, no skin discoloration or tenderness. No palpable cords. LYMPHNODES: No palpable cervical, supraclavicular, axillary or inguinal adenopathy  NEUROLOGICAL:  Tremor. PSYCH: Appropriate.   Appointment on 05/17/2016  Component Date Value Ref Range Status  . WBC 05/17/2016 7.0  3.6 - 11.0 K/uL Final  . RBC 05/17/2016 4.14  3.80 - 5.20 MIL/uL Final  . Hemoglobin 05/17/2016 12.6  12.0 - 16.0 g/dL Final  . HCT 05/17/2016 37.1  35.0 - 47.0 % Final  . MCV 05/17/2016 89.6  80.0 - 100.0 fL Final  . MCH 05/17/2016 30.4  26.0 - 34.0 pg Final  . MCHC 05/17/2016 33.9  32.0 - 36.0 g/dL Final  . RDW 05/17/2016 13.9  11.5 - 14.5 % Final  . Platelets 05/17/2016 259  150 - 440 K/uL Final  . Neutrophils Relative % 05/17/2016 54  % Final  . Neutro Abs 05/17/2016 3.8  1.4 - 6.5 K/uL Final  . Lymphocytes Relative 05/17/2016 32  % Final  . Lymphs Abs 05/17/2016 2.3  1.0 - 3.6 K/uL Final  . Monocytes Relative 05/17/2016 11  % Final  . Monocytes Absolute 05/17/2016 0.8  0.2 - 0.9 K/uL  Final  . Eosinophils Relative 05/17/2016 2  % Final  . Eosinophils Absolute 05/17/2016 0.1  0 - 0.7 K/uL Final  . Basophils Relative 05/17/2016 1  % Final  . Basophils Absolute 05/17/2016 0.1  0 - 0.1 K/uL Final  . Sodium 05/17/2016 137  135 - 145 mmol/L Final  . Potassium 05/17/2016 3.5  3.5 - 5.1 mmol/L Final  . Chloride 05/17/2016 102  101 - 111 mmol/L Final  . CO2 05/17/2016 27  22 - 32 mmol/L Final  . Glucose, Bld 05/17/2016 117* 65 - 99 mg/dL Final  . BUN 05/17/2016 22* 6 - 20 mg/dL Final  . Creatinine, Ser 05/17/2016 0.95  0.44 - 1.00 mg/dL Final  . Calcium 05/17/2016 9.7  8.9 - 10.3 mg/dL Final  . Total Protein 05/17/2016 6.9  6.5 - 8.1 g/dL Final  . Albumin 05/17/2016 3.6  3.5 - 5.0 g/dL Final  . AST 05/17/2016 13* 15 - 41 U/L Final  . ALT 05/17/2016 <5* 14 - 54 U/L Final  . Alkaline Phosphatase 05/17/2016 52  38 - 126 U/L Final  . Total Bilirubin 05/17/2016 0.5  0.3 - 1.2 mg/dL Final  . GFR calc non Af Amer 05/17/2016 52* >60 mL/min Final  . GFR calc Af Amer 05/17/2016 60* >60 mL/min Final   Comment: (NOTE) The eGFR has been calculated using the CKD EPI  equation. This calculation has not been validated in all clinical situations. eGFR's persistently <60 mL/min signify possible Chronic Kidney Disease.   Georgiann Hahn gap 05/17/2016 8  5 - 15 Final    Assessment:  MATINA RODIER is a 83 y.o. female with stage I left breast cancer s/p partial mastectomy and sentinel lymph node biopsy on 12/08/2015.  Pathology revealed a 0.7 cm grade I  invasive mammary carcinoma of no special type.  There was no lymphvascular invasion.  Margins were clear.  One sentinel lymph node was negative.  Tumor was ER positive (> 90%), PR negative, and HER-2/neu 2+ (negative by FISH).  Pathologic stage was T1bN0.  CA 27.29 was 27.6 on 12/08/2015.  Bilateral diagnostic mammogram on 11/06/2015 revealed a suspicious 5 mm mass in the upper outer left breast.   Radiation therapy was deferred secondary to her age, Parkinson's disease, and small well differentiated ER/PR+ tumor.  She began tamoxifen on 01/12/2016.  She is tolerating it well.  She has osteoporosis and was previously on Zometa for 5 years.  Bone density study on 12/05/2015 revealed osteoporosis with a T-score of -2.9 in AP spine L1-L4 and -2.5 in the right femoral neck.  She began Prolia on 12/19/2015  She has Parkinson's disease.  Gait is unstable.  She has a history of recurrent UTIs, vaginal atrophy, and incontinence.  She is using estrogen cream sparingly (once a week).  She has a history of iron deficiency.  She notes 2 colonoscopies in the past. She has been on B12 shots for years.   Symptomatically, she tires easily.  Exam is stable.  Plan: 1.  Labs today:  CBC with diff, CMP, CA27.29.. 2.  Continue tamoxifen. 3.  RTC in 4 months for MD assessement and labs (CBC with diff, CMp, CA27.29).   Lequita Asal, MD  05/17/2016, 2:53 PM

## 2016-05-17 NOTE — Telephone Encounter (Signed)
  Have patient return 06/17/2016 for labs (BMP) and Prolia.  M

## 2016-05-17 NOTE — Telephone Encounter (Signed)
Called patient's son and informed him that MD would like patient to come in May for prolia and lab.  Appt. Monday, May 21 - labs at 1:30, injection 1:45.  Verbalized understanding.

## 2016-05-17 NOTE — Telephone Encounter (Signed)
Patient had Prolia on 12-19-15.  She does not have an appointment for another one in 6 months from that date which would be in May.  Is patient to continue getting injections?  If so can she get it at her next scheduled appointment which will be in August.  If patient is to continue with injections will need order.  Thanks!

## 2016-05-17 NOTE — Progress Notes (Signed)
Patient here for follow up no changes since last appt 

## 2016-05-18 LAB — CANCER ANTIGEN 27.29: CA 27.29: 24.4 U/mL (ref 0.0–38.6)

## 2016-06-05 ENCOUNTER — Other Ambulatory Visit: Payer: Self-pay | Admitting: Physician Assistant

## 2016-06-05 ENCOUNTER — Ambulatory Visit
Admission: RE | Admit: 2016-06-05 | Discharge: 2016-06-05 | Disposition: A | Discharge status: Home / Self Care | Payer: Medicare Other | Source: Ambulatory Visit | Attending: Physician Assistant | Admitting: Physician Assistant

## 2016-06-05 DIAGNOSIS — J90 Pleural effusion, not elsewhere classified: Secondary | ICD-10-CM | POA: Diagnosis not present

## 2016-06-05 DIAGNOSIS — I2699 Other pulmonary embolism without acute cor pulmonale: Secondary | ICD-10-CM | POA: Diagnosis not present

## 2016-06-05 DIAGNOSIS — M7989 Other specified soft tissue disorders: Secondary | ICD-10-CM | POA: Insufficient documentation

## 2016-06-05 DIAGNOSIS — I700 Atherosclerosis of aorta: Secondary | ICD-10-CM | POA: Insufficient documentation

## 2016-06-05 DIAGNOSIS — I82401 Acute embolism and thrombosis of unspecified deep veins of right lower extremity: Secondary | ICD-10-CM

## 2016-06-05 DIAGNOSIS — I824Z1 Acute embolism and thrombosis of unspecified deep veins of right distal lower extremity: Secondary | ICD-10-CM | POA: Diagnosis not present

## 2016-06-05 DIAGNOSIS — I4891 Unspecified atrial fibrillation: Secondary | ICD-10-CM

## 2016-06-05 MED ORDER — IOPAMIDOL (ISOVUE-370) INJECTION 76%
75.0000 mL | Freq: Once | INTRAVENOUS | Status: AC | PRN
  Administered 2016-06-05: 75 mL via INTRAVENOUS

## 2016-06-17 ENCOUNTER — Inpatient Hospital Stay: Payer: Medicare Other | Attending: Hematology and Oncology

## 2016-06-17 ENCOUNTER — Inpatient Hospital Stay: Payer: Medicare Other

## 2016-06-17 DIAGNOSIS — Z79810 Long term (current) use of selective estrogen receptor modulators (SERMs): Secondary | ICD-10-CM | POA: Insufficient documentation

## 2016-06-17 DIAGNOSIS — C50412 Malignant neoplasm of upper-outer quadrant of left female breast: Secondary | ICD-10-CM | POA: Insufficient documentation

## 2016-06-17 DIAGNOSIS — M810 Age-related osteoporosis without current pathological fracture: Secondary | ICD-10-CM

## 2016-06-17 DIAGNOSIS — Z170 Estrogen receptor positive status [ER+]: Secondary | ICD-10-CM | POA: Insufficient documentation

## 2016-06-17 DIAGNOSIS — Z853 Personal history of malignant neoplasm of breast: Secondary | ICD-10-CM

## 2016-06-17 LAB — BASIC METABOLIC PANEL
Anion gap: 7 (ref 5–15)
BUN: 16 mg/dL (ref 6–20)
CO2: 27 mmol/L (ref 22–32)
Calcium: 8.9 mg/dL (ref 8.9–10.3)
Chloride: 103 mmol/L (ref 101–111)
Creatinine, Ser: 0.66 mg/dL (ref 0.44–1.00)
GFR calc Af Amer: >60 mL/min (ref >60)
GFR calc non Af Amer: >60 mL/min (ref >60)
Glucose, Bld: 100 mg/dL — ABNORMAL HIGH (ref 65–99)
Potassium: 4 mmol/L (ref 3.5–5.1)
Sodium: 137 mmol/L (ref 135–145)

## 2016-06-17 MED ORDER — DENOSUMAB 60 MG/ML ~~LOC~~ SOLN
60.0000 mg | Freq: Once | SUBCUTANEOUS | Status: AC
  Administered 2016-06-17: 60 mg via SUBCUTANEOUS
  Filled 2016-06-17: qty 1

## 2016-06-18 ENCOUNTER — Emergency Department: Payer: Medicare Other

## 2016-06-18 ENCOUNTER — Encounter: Payer: Self-pay | Admitting: Emergency Medicine

## 2016-06-18 ENCOUNTER — Inpatient Hospital Stay
Admission: EM | Admit: 2016-06-18 | Discharge: 2016-06-21 | DRG: 689 | Disposition: A | Discharge status: Home / Self Care | Payer: Medicare Other | Attending: Internal Medicine | Admitting: Internal Medicine

## 2016-06-18 DIAGNOSIS — I10 Essential (primary) hypertension: Secondary | ICD-10-CM | POA: Diagnosis present

## 2016-06-18 DIAGNOSIS — Z7901 Long term (current) use of anticoagulants: Secondary | ICD-10-CM | POA: Diagnosis not present

## 2016-06-18 DIAGNOSIS — I361 Nonrheumatic tricuspid (valve) insufficiency: Secondary | ICD-10-CM | POA: Diagnosis present

## 2016-06-18 DIAGNOSIS — Z853 Personal history of malignant neoplasm of breast: Secondary | ICD-10-CM | POA: Diagnosis not present

## 2016-06-18 DIAGNOSIS — G20 Parkinson's disease: Secondary | ICD-10-CM | POA: Diagnosis present

## 2016-06-18 DIAGNOSIS — N390 Urinary tract infection, site not specified: Principal | ICD-10-CM | POA: Diagnosis present

## 2016-06-18 DIAGNOSIS — I480 Paroxysmal atrial fibrillation: Secondary | ICD-10-CM | POA: Diagnosis present

## 2016-06-18 DIAGNOSIS — I82499 Acute embolism and thrombosis of other specified deep vein of unspecified lower extremity: Secondary | ICD-10-CM | POA: Diagnosis present

## 2016-06-18 DIAGNOSIS — H269 Unspecified cataract: Secondary | ICD-10-CM | POA: Diagnosis present

## 2016-06-18 DIAGNOSIS — Z79899 Other long term (current) drug therapy: Secondary | ICD-10-CM

## 2016-06-18 DIAGNOSIS — K449 Diaphragmatic hernia without obstruction or gangrene: Secondary | ICD-10-CM | POA: Diagnosis present

## 2016-06-18 DIAGNOSIS — I2699 Other pulmonary embolism without acute cor pulmonale: Secondary | ICD-10-CM | POA: Diagnosis present

## 2016-06-18 DIAGNOSIS — G20A1 Parkinson's disease without dyskinesia, without mention of fluctuations: Secondary | ICD-10-CM | POA: Diagnosis present

## 2016-06-18 DIAGNOSIS — R531 Weakness: Secondary | ICD-10-CM

## 2016-06-18 DIAGNOSIS — D649 Anemia, unspecified: Secondary | ICD-10-CM | POA: Diagnosis present

## 2016-06-18 LAB — URINALYSIS, COMPLETE (UACMP) WITH MICROSCOPIC
Bilirubin Urine: NEGATIVE
Glucose, UA: NEGATIVE mg/dL
KETONES UR: 20 mg/dL — AB
Leukocytes, UA: NEGATIVE
NITRITE: POSITIVE — AB
PROTEIN: 100 mg/dL — AB
Specific Gravity, Urine: 1.029 (ref 1.005–1.030)
pH: 5 (ref 5.0–8.0)

## 2016-06-18 LAB — CBC WITH DIFFERENTIAL/PLATELET
BASOS PCT: 1 %
Basophils Absolute: 0 10*3/uL (ref 0–0.1)
EOS ABS: 0 10*3/uL (ref 0–0.7)
Eosinophils Relative: 1 %
HCT: 34.7 % — ABNORMAL LOW (ref 35.0–47.0)
Hemoglobin: 11.4 g/dL — ABNORMAL LOW (ref 12.0–16.0)
Lymphocytes Relative: 35 %
Lymphs Abs: 1.3 10*3/uL (ref 1.0–3.6)
MCH: 29.8 pg (ref 26.0–34.0)
MCHC: 32.9 g/dL (ref 32.0–36.0)
MCV: 90.6 fL (ref 80.0–100.0)
MONO ABS: 0.7 10*3/uL (ref 0.2–0.9)
MONOS PCT: 19 %
NEUTROS PCT: 44 %
Neutro Abs: 1.7 10*3/uL (ref 1.4–6.5)
PLATELETS: 335 10*3/uL (ref 150–440)
RBC: 3.83 MIL/uL (ref 3.80–5.20)
RDW: 13.4 % (ref 11.5–14.5)
WBC: 3.8 10*3/uL (ref 3.6–11.0)

## 2016-06-18 LAB — COMPREHENSIVE METABOLIC PANEL
ALT: <5 U/L — ABNORMAL LOW (ref 14–54)
AST: 18 U/L (ref 15–41)
Albumin: 3.2 g/dL — ABNORMAL LOW (ref 3.5–5.0)
Alkaline Phosphatase: 40 U/L (ref 38–126)
Anion gap: 9 (ref 5–15)
BILIRUBIN TOTAL: 0.8 mg/dL (ref 0.3–1.2)
BUN: 16 mg/dL (ref 6–20)
CALCIUM: 8 mg/dL — AB (ref 8.9–10.3)
CO2: 26 mmol/L (ref 22–32)
Chloride: 100 mmol/L — ABNORMAL LOW (ref 101–111)
Creatinine, Ser: 0.45 mg/dL (ref 0.44–1.00)
GFR calc Af Amer: >60 mL/min (ref >60)
Glucose, Bld: 96 mg/dL (ref 65–99)
POTASSIUM: 4 mmol/L (ref 3.5–5.1)
Sodium: 135 mmol/L (ref 135–145)
TOTAL PROTEIN: 6.4 g/dL — AB (ref 6.5–8.1)

## 2016-06-18 LAB — TROPONIN I: Troponin I: <0.03 ng/mL (ref <0.03)

## 2016-06-18 LAB — BRAIN NATRIURETIC PEPTIDE: B NATRIURETIC PEPTIDE 5: 396 pg/mL — AB (ref 0.0–100.0)

## 2016-06-18 MED ORDER — CEFTRIAXONE SODIUM 1 G IJ SOLR
1.0000 g | INTRAMUSCULAR | Status: DC
  Administered 2016-06-18: 1 g via INTRAVENOUS
  Filled 2016-06-18 (×2): qty 10

## 2016-06-18 MED ORDER — SODIUM CHLORIDE 0.9 % IV SOLN
Freq: Once | INTRAVENOUS | Status: AC
  Administered 2016-06-18: 21:00:00 via INTRAVENOUS

## 2016-06-18 NOTE — ED Provider Notes (Addendum)
[facility] Emergency Department Provider Note   ____________________________________________   First MD Initiated Contact with Patient 06/18/16 1958     (approximate)  I have reviewed the triage vital signs and the nursing notes.   HISTORY  Chief Complaint Weakness   HPI Laura Cisneros is a 83 y.o. female who 2 weeks ago developed atrial fibrillation and then a blood clot in the leg which went into the lungs. She was started on xarelto. The last several days she has been getting weaker and not eating or drinking in fact today she was unable to walk at home. Here in the emergency room the nurse and I were able to get her out of bed and attempted to walk her. She was able to shuffle her feet forward 1 or 2 inches at a time but had a lot of difficulty moving. She denies any nausea or vomiting or diarrhea she has no belly pain or cough but she is not eating or drinking as noted. Yesterday she did eat or drink a little bit in the day before little bit more but she's been getting weaker and having less energy and abilities to move. She is not running a fever.   Past Medical History:  Diagnosis Date  . Anemia   . Arthritis    osteoarthritis  . Atrophic vaginitis   . Cancer ([place])    Breast  . Cataract   . Diverticulosis   . Hiatal hernia   . Hypertension   . Incontinence   . Osteoporosis   . Parkinson disease ([place])   . Vitamin B12 deficiency     Patient Active Problem List   Diagnosis Date Noted  . Osteoporosis without current pathological fracture 12/04/2015  . Breast cancer of upper-outer quadrant of left female breast ([place]) 11/22/2015    Past Surgical History:  Procedure Laterality Date  . ABDOMINAL HYSTERECTOMY    . ABDOMINAL HYSTERECTOMY    . BREAST BIOPSY Left 10/2012   benign  . BREAST BIOPSY Left 11/22/2015   invasive mammary ca   . BREAST EXCISIONAL BIOPSY Left 12/08/2015   lumpectomy  . EYE SURGERY Bilateral    Catarct Extraction with  IOL  . FOOT FRACTURE SURGERY Right    pinning, Dr. Francia Greaves, [facility]  . INCONTINENCE SURGERY    . PARTIAL MASTECTOMY WITH AXILLARY SENTINEL LYMPH NODE BIOPSY Left 12/08/2015   Procedure: PARTIAL MASTECTOMY WITH AXILLARY SENTINEL LYMPH NODE BIOPSY;  Surgeon: [name], MD;  Location: [facility] ORS;  Service: General;  Laterality: Left;    Prior to Admission medications   Medication Sig Start Date End Date Taking? Authorizing Provider  acetaminophen (TYLENOL) 500 MG tablet Take 500 mg by mouth every 6 (six) hours as needed (pain).   Yes [provider]  carbidopa-levodopa (SINEMET CR) 50-200 MG tablet Take 3 tablets by mouth 3 (three) times daily.  10/24/15 10/23/16 Yes [provider]  conjugated estrogens (PREMARIN) vaginal cream Place 1 Applicatorful vaginally daily. Apply 0.5mg  (pea-sized amount)  just inside the vaginal introitus with a finger-tip every night for two weeks and then Monday, Wednesday and Friday nights. Patient taking differently: Place 1 Applicatorful vaginally once a week. On Wednesday 10/12/15  Yes McGowan, Larene Beach A, PA-C  cyanocobalamin (,VITAMIN B-12,) 1000 MCG/ML injection INJECT 1ML INTO MUSCLE ONCE A MONTH AS DIRECTED 01/04/15  Yes [provider]  fesoterodine (TOVIAZ) 4 MG TB24 tablet Take 1 tablet (4 mg total) by mouth daily. 02/27/16  Yes McGowan, Hunt Oris, PA-C  hydrochlorothiazide (MICROZIDE) 12.5 MG capsule Take 12.5 mg by mouth as needed.    Yes [provider]  hydroxypropyl methylcellulose / hypromellose (ISOPTO TEARS / GONIOVISC) 2.5 % ophthalmic solution Place 1 drop into both eyes 3 (three) times daily as needed for dry eyes.   Yes [provider]  metoprolol succinate (TOPROL-XL) 100 MG 24 hr tablet Take 100 mg by mouth daily.  08/31/15  Yes [provider]  XARELTO 15 MG TABS tablet Take 15 mg by mouth 2 (two) times daily. 06/05/16 06/26/16 Yes [provider]  HYDROcodone-acetaminophen (NORCO) 5-325  MG tablet Take 1 tablet by mouth every 4 (four) hours as needed for moderate pain. Patient not taking: Reported on 06/18/2016 12/08/15   [name], MD  mirabegron ER (MYRBETRIQ) 25 MG TB24 tablet Take 1 tablet (25 mg total) by mouth daily. Patient not taking: Reported on 06/18/2016 10/12/15   Zara Council A, PA-C    Allergies Patient has no known allergies.  Family History  Problem Relation Age of Onset  . Kidney disease Neg Hx   . Bladder Cancer Neg Hx   . Breast cancer Neg Hx     Social History Social History  Substance Use Topics  . Smoking status: Never Smoker  . Smokeless tobacco: Never Used  . Alcohol use No    Review of Systems  Constitutional: No fever/chills Eyes: No visual changes. ENT: No sore throat. Cardiovascular: Denies chest pain. Respiratory: Denies shortness of breath. Gastrointestinal: No abdominal pain.  No nausea, no vomiting.  No diarrhea.  No constipation. Genitourinary: Negative for dysuria. Musculoskeletal: Negative for back pain. Skin: Negative for rash. Neurological: Negative for headaches, focal weakness or numbness.   ____________________________________________   PHYSICAL EXAM:  VITAL SIGNS: ED Triage Vitals  Enc Vitals Group     BP 06/18/16 1919 128/87     Pulse Rate 06/18/16 1919 66     Resp 06/18/16 1919 18     Temp 06/18/16 1919 98.4 F (36.9 C)     Temp Source 06/18/16 1919 Oral     SpO2 06/18/16 1919 96 %     Weight 06/18/16 1920 142 lb (64.4 kg)     Height 06/18/16 1920 4\' 11"  (1.499 m)     Head Circumference --      Peak Flow --      Pain Score --      Pain Loc --      Pain Edu? --      Excl. in [place]? --    Constitutional: Alert and oriented. Well appearing and in no acute distress. Eyes: Conjunctivae are normal. PERRL. EOMI. Head: Atraumatic. Nose: No congestion/rhinnorhea. Mouth/Throat: Mucous membranes are moist.  Oropharynx non-erythematous. Neck: No stridor.  Cardiovascular: Normal rate, regular  rhythm. Grossly normal heart sounds.  Good peripheral circulation. Respiratory: Normal respiratory effort.  No retractions. Lungs CTAB. Gastrointestinal: Soft and nontender. No distention. No abdominal bruits. No CVA tenderness. Musculoskeletal: No lower extremity tenderness nor Trace edema on the right leg which is with a DVT was.  No joint effusions. Neurologic:  Normal speech and language. No gross focal neurologic deficits are appreciated. Cranial nerves II through XII are intact in the visual fields were not checked. Patient has a very marked intention tremor which interferes with finger to nose and rapid alternating movement testing. Motor strength is 5 over 5 throughout sensation appears to be intact throughout gait described in history of present illness Skin:  Skin is warm, dry and intact. No rash  noted. Psychiatric: Mood and affect are normal. Speech and behavior are normal.  ____________________________________________   LABS (all labs ordered are listed, but only abnormal results are displayed)  Labs Reviewed  COMPREHENSIVE METABOLIC PANEL - Abnormal; Notable for the following:       Result Value   Chloride 100 (*)    Calcium 8.0 (*)    Total Protein 6.4 (*)    Albumin 3.2 (*)    ALT <5 (*)    All other components within normal limits  BRAIN NATRIURETIC PEPTIDE - Abnormal; Notable for the following:    B Natriuretic Peptide 396.0 (*)    All other components within normal limits  CBC WITH DIFFERENTIAL/PLATELET - Abnormal; Notable for the following:    Hemoglobin 11.4 (*)    HCT 34.7 (*)    All other components within normal limits  TROPONIN I  URINALYSIS, COMPLETE (UACMP) WITH MICROSCOPIC   ____________________________________________  EKG EKG read and interpreted by me shows a flutter rate of 128 normal axis nonspecific ST-T wave changes there are no EKGs available that show a flutter or A. fib but there are some notes in the chart review that indicate the patient has  had A. fib. For at least a couple weeks.  ____________________________________________  RADIOLOGY Study Result   CLINICAL DATA:  Weakness  EXAM: PORTABLE CHEST 1 VIEW  COMPARISON:  06/05/2016  FINDINGS: Small left greater than right pleural effusions with left basilar atelectasis or pneumonia and patchy atelectasis or infiltrate at the right base. Cardiomegaly with mild central vascular congestion. Large hiatal hernia. Aortic atherosclerosis. No pneumothorax.  IMPRESSION: 1. Small left greater than right pleural effusions with bibasilar atelectasis or infiltrates. 2. Moderate cardiomegaly with central vascular congestion   Electronically Signed   By: Donavan Foil M.D.   On: 06/18/2016 20:42    Study Result   CLINICAL DATA:  Gait instability, generalized weakness and confusion. History of Parkinson's, hypertension, breast cancer.  EXAM: CT HEAD WITHOUT CONTRAST  TECHNIQUE: Contiguous axial images were obtained from the base of the skull through the vertex without intravenous contrast.  COMPARISON:  MRI of the head Jun 15, 2004  FINDINGS: BRAIN: No intraparenchymal hemorrhage, mass effect nor midline shift. The ventricles and sulci are normal for age. Confluent supratentorial white matter hypodensities. No acute large vascular territory infarcts. No abnormal extra-axial fluid collections. Basal cisterns are patent.  VASCULAR: Mild calcific atherosclerosis of the carotid siphons.  SKULL: No skull fracture. No significant scalp soft tissue swelling.  SINUSES/ORBITS: The mastoid air-cells and included paranasal sinuses are well-aerated.The included ocular globes and orbital contents are non-suspicious. Status post bilateral ocular lens implants.  OTHER: None.  IMPRESSION: No acute intracranial process.  Moderate to severe chronic small vessel ischemic disease.   Electronically Signed   By: Elon Alas M.D.   On: 06/18/2016  20:57      ____________________________________________   PROCEDURES  Procedure(s) performed:   Procedures  Critical Care performed:   ____________________________________________   INITIAL IMPRESSION / ASSESSMENT AND PLAN / ED COURSE  Pertinent labs & imaging results that were available during my care of the patient were reviewed by me and considered in my medical decision making (see chart for details).        ____________________________________________   FINAL CLINICAL IMPRESSION(S) / ED DIAGNOSES  Final diagnoses:  Weakness      NEW MEDICATIONS STARTED DURING THIS VISIT:  New Prescriptions   No medications on file     Note:  This document was  prepared using Systems analyst and may include unintentional dictation errors.    Nena Polio, MD 06/18/16 2056    Nena Polio, MD 06/18/16 [phone]

## 2016-06-18 NOTE — Progress Notes (Signed)
ANTIBIOTIC CONSULT NOTE - INITIAL  Pharmacy Consult for Ceftriaxone  Indication: UTI  No Known Allergies  Patient Measurements: Height: 4\' 11"  (149.9 cm) Weight: 142 lb (64.4 kg) IBW/kg (Calculated) : 43.2 Adjusted Body Weight:   Vital Signs: Temp: 98.4 F (36.9 C) (05/22 1919) Temp Source: Oral (05/22 1919) BP: 130/91 (05/22 2059) Pulse Rate: 112 (05/22 2059) Intake/Output from previous day: No intake/output data recorded. Intake/Output from this shift: No intake/output data recorded.  Labs:  Recent Labs  06/17/16 1325 06/18/16 1956  WBC  --  3.8  HGB  --  11.4*  PLT  --  335  CREATININE 0.66 0.45   Estimated Creatinine Clearance: 38.9 mL/min (by C-G formula based on SCr of 0.45 mg/dL). No results for input(s): VANCOTROUGH, VANCOPEAK, VANCORANDOM, GENTTROUGH, GENTPEAK, GENTRANDOM, TOBRATROUGH, TOBRAPEAK, TOBRARND, AMIKACINPEAK, AMIKACINTROU, AMIKACIN in the last 72 hours.   Microbiology: No results found for this or any previous visit (from the past 720 hour(s)).  Medical History: Past Medical History:  Diagnosis Date  . Anemia   . Arthritis    osteoarthritis  . Atrophic vaginitis   . Cancer ([place])    Breast  . Cataract   . Diverticulosis   . Hiatal hernia   . Hypertension   . Incontinence   . Osteoporosis   . Parkinson disease ([place])   . Vitamin B12 deficiency     Medications:   (Not in a hospital admission) Assessment: CrCl = 38.9 ml/min  Goal of Therapy:  resolution of infection  Plan:  Expected duration 7 days with resolution of temperature and/or normalization of WBC   Ceftriaxone 1 gm IV Q24H ordered to start on 5/22 @ 23:00.   Abagale Boulos D 06/18/2016,10:12 PM

## 2016-06-18 NOTE — H&P (Signed)
[facility] NAME: Laura Cisneros    MR#:  [id number]  DATE OF BIRTH:  April 08, 1925  DATE OF ADMISSION:  06/18/2016  PRIMARY CARE PHYSICIAN: Leonel Ramsay, MD   REQUESTING/REFERRING PHYSICIAN: Cinda Quest, MD  CHIEF COMPLAINT:   Chief Complaint  Patient presents with  . Weakness    HISTORY OF PRESENT ILLNESS:  Laura Cisneros  is a 83 y.o. female who presents with persistent malaise, fatigue.  Last week she was diagnosed with DVT and PE.  Also a new diagnosis of a fib at that time.  On evaluation in ED tonight she is dehydrated and has nitrite positive UA indicative of UTI.  Hospitalists were called for admission  PAST MEDICAL HISTORY:   Past Medical History:  Diagnosis Date  . Anemia   . Arthritis    osteoarthritis  . Atrophic vaginitis   . Cancer ([place])    Breast  . Cataract   . Diverticulosis   . Hiatal hernia   . Hypertension   . Incontinence   . Osteoporosis   . Parkinson disease ([place])   . Vitamin B12 deficiency     PAST SURGICAL HISTORY:   Past Surgical History:  Procedure Laterality Date  . ABDOMINAL HYSTERECTOMY    . ABDOMINAL HYSTERECTOMY    . BREAST BIOPSY Left 10/2012   benign  . BREAST BIOPSY Left 11/22/2015   invasive mammary ca   . BREAST EXCISIONAL BIOPSY Left 12/08/2015   lumpectomy  . EYE SURGERY Bilateral    Catarct Extraction with IOL  . FOOT FRACTURE SURGERY Right    pinning, Dr. Francia Greaves, [facility]  . INCONTINENCE SURGERY    . PARTIAL MASTECTOMY WITH AXILLARY SENTINEL LYMPH NODE BIOPSY Left 12/08/2015   Procedure: PARTIAL MASTECTOMY WITH AXILLARY SENTINEL LYMPH NODE BIOPSY;  Surgeon: [name], MD;  Location: [facility] ORS;  Service: General;  Laterality: Left;    SOCIAL HISTORY:   Social History  Substance Use Topics  . Smoking status: Never Smoker  . Smokeless tobacco: Never Used  . Alcohol use No    FAMILY HISTORY:   Family History  Problem Relation Age of Onset  .  Esophageal cancer Sister   . Kidney disease Neg Hx   . Bladder Cancer Neg Hx   . Breast cancer Neg Hx     DRUG ALLERGIES:  No Known Allergies  MEDICATIONS AT HOME:   Prior to Admission medications   Medication Sig Start Date End Date Taking? Authorizing Provider  acetaminophen (TYLENOL) 500 MG tablet Take 500 mg by mouth every 6 (six) hours as needed (pain).   Yes [provider]  carbidopa-levodopa (SINEMET CR) 50-200 MG tablet Take 3 tablets by mouth 3 (three) times daily.  10/24/15 10/23/16 Yes [provider]  conjugated estrogens (PREMARIN) vaginal cream Place 1 Applicatorful vaginally daily. Apply 0.5mg  (pea-sized amount)  just inside the vaginal introitus with a finger-tip every night for two weeks and then Monday, Wednesday and Friday nights. Patient taking differently: Place 1 Applicatorful vaginally once a week. On Wednesday 10/12/15  Yes McGowan, Larene Beach A, PA-C  cyanocobalamin (,VITAMIN B-12,) 1000 MCG/ML injection INJECT 1ML INTO MUSCLE ONCE A MONTH AS DIRECTED 01/04/15  Yes [provider]  fesoterodine (TOVIAZ) 4 MG TB24 tablet Take 1 tablet (4 mg total) by mouth daily. 02/27/16  Yes McGowan, Larene Beach A, PA-C  hydrochlorothiazide (MICROZIDE) 12.5 MG capsule Take 12.5 mg by mouth as needed.    Yes [provider]  hydroxypropyl  methylcellulose / hypromellose (ISOPTO TEARS / GONIOVISC) 2.5 % ophthalmic solution Place 1 drop into both eyes 3 (three) times daily as needed for dry eyes.   Yes [provider]  metoprolol succinate (TOPROL-XL) 100 MG 24 hr tablet Take 100 mg by mouth daily.  08/31/15  Yes [provider]  XARELTO 15 MG TABS tablet Take 15 mg by mouth 2 (two) times daily. 06/05/16 06/26/16 Yes [provider]  mirabegron ER (MYRBETRIQ) 25 MG TB24 tablet Take 1 tablet (25 mg total) by mouth daily. Patient not taking: Reported on 06/18/2016 10/12/15   Zara Council A, PA-C    REVIEW OF SYSTEMS:  Review of Systems   Constitutional: Positive for malaise/fatigue. Negative for chills, fever and weight loss.  HENT: Negative for ear pain, hearing loss and tinnitus.   Eyes: Negative for blurred vision, double vision, pain and redness.  Respiratory: Negative for cough, hemoptysis and shortness of breath.   Cardiovascular: Negative for chest pain, palpitations, orthopnea and leg swelling.  Gastrointestinal: Negative for abdominal pain, constipation, diarrhea, nausea and vomiting.  Genitourinary: Positive for frequency. Negative for dysuria and hematuria.  Musculoskeletal: Negative for back pain, joint pain and neck pain.  Skin:       No acne, rash, or lesions  Neurological: Positive for weakness. Negative for dizziness, tremors and focal weakness.  Endo/Heme/Allergies: Negative for polydipsia. Does not bruise/bleed easily.  Psychiatric/Behavioral: Negative for depression. The patient is not nervous/anxious and does not have insomnia.      VITAL SIGNS:   Vitals:   06/18/16 1919 06/18/16 1920 06/18/16 1958 06/18/16 2059  BP: 128/87  127/83 (!) 130/91  Pulse: 66  (!) 141 (!) 112  Resp: 18  18 18   Temp: 98.4 F (36.9 C)     TempSrc: Oral     SpO2: 96%  94% 97%  Weight:  64.4 kg (142 lb)    Height:  4\' 11"  (1.499 m)     Wt Readings from Last 3 Encounters:  06/18/16 64.4 kg (142 lb)  05/17/16 65.4 kg (144 lb 3.2 oz)  02/27/16 65.6 kg (144 lb 9.6 oz)    PHYSICAL EXAMINATION:  Physical Exam  Vitals reviewed. Constitutional: She is oriented to person, place, and time. She appears well-developed and well-nourished. No distress.  HENT:  Head: Normocephalic and atraumatic.  Mouth/Throat: Oropharynx is clear and moist.  Eyes: Conjunctivae and EOM are normal. Pupils are equal, round, and reactive to light. No scleral icterus.  Neck: Normal range of motion. Neck supple. No JVD present. No thyromegaly present.  Cardiovascular: Intact distal pulses.  Exam reveals no gallop and no friction rub.   No murmur  heard. Tachycardic, irregular rhythm  Respiratory: Effort normal and breath sounds normal. No respiratory distress. She has no wheezes. She has no rales.  GI: Soft. Bowel sounds are normal. She exhibits no distension. There is no tenderness.  Musculoskeletal: Normal range of motion. She exhibits no edema.  No arthritis, no gout  Lymphadenopathy:    She has no cervical adenopathy.  Neurological: She is alert and oriented to person, place, and time. No cranial nerve deficit.  No dysarthria, no aphasia  Skin: Skin is warm and dry. No rash noted. No erythema.  Psychiatric: She has a normal mood and affect. Her behavior is normal. Judgment and thought content normal.    LABORATORY PANEL:   CBC  Recent Labs Lab 06/18/16 1956  WBC 3.8  HGB 11.4*  HCT 34.7*  PLT 335   ------------------------------------------------------------------------------------------------------------------  Chemistries  Recent Labs Lab 06/18/16 1956  NA 135  K 4.0  CL 100*  CO2 26  GLUCOSE 96  BUN 16  CREATININE 0.45  CALCIUM 8.0*  AST 18  ALT <5*  ALKPHOS 40  BILITOT 0.8   ------------------------------------------------------------------------------------------------------------------  Cardiac Enzymes  Recent Labs Lab 06/18/16 1956  TROPONINI <0.03   ------------------------------------------------------------------------------------------------------------------  RADIOLOGY:  Ct Head Wo Contrast  Result Date: 06/18/2016 CLINICAL DATA:  Gait instability, generalized weakness and confusion. History of Parkinson's, hypertension, breast cancer. EXAM: CT HEAD WITHOUT CONTRAST TECHNIQUE: Contiguous axial images were obtained from the base of the skull through the vertex without intravenous contrast. COMPARISON:  MRI of the head Jun 15, 2004 FINDINGS: BRAIN: No intraparenchymal hemorrhage, mass effect nor midline shift. The ventricles and sulci are normal for age. Confluent supratentorial white  matter hypodensities. No acute large vascular territory infarcts. No abnormal extra-axial fluid collections. Basal cisterns are patent. VASCULAR: Mild calcific atherosclerosis of the carotid siphons. SKULL: No skull fracture. No significant scalp soft tissue swelling. SINUSES/ORBITS: The mastoid air-cells and included paranasal sinuses are well-aerated.The included ocular globes and orbital contents are non-suspicious. Status post bilateral ocular lens implants. OTHER: None. IMPRESSION: No acute intracranial process. Moderate to severe chronic small vessel ischemic disease. Electronically Signed   By: Elon Alas M.D.   On: 06/18/2016 20:57   Dg Chest Portable 1 View  Result Date: 06/18/2016 CLINICAL DATA:  Weakness EXAM: PORTABLE CHEST 1 VIEW COMPARISON:  06/05/2016 FINDINGS: Small left greater than right pleural effusions with left basilar atelectasis or pneumonia and patchy atelectasis or infiltrate at the right base. Cardiomegaly with mild central vascular congestion. Large hiatal hernia. Aortic atherosclerosis. No pneumothorax. IMPRESSION: 1. Small left greater than right pleural effusions with bibasilar atelectasis or infiltrates. 2. Moderate cardiomegaly with central vascular congestion Electronically Signed   By: Donavan Foil M.D.   On: 06/18/2016 20:42    EKG:   Orders placed or performed during the hospital encounter of 06/18/16  . ED EKG  . ED EKG  . ED EKG  . ED EKG    IMPRESSION AND PLAN:  Principal Problem:   UTI (urinary tract infection) - IV antibiotics, IV fluids for hydration Active Problems:   AF (paroxysmal atrial fibrillation) ([facility]) - rate controlling meds, anticoagulation   PE (pulmonary thromboembolism) ([place]) - diagnosed about 1 week ago, continue anticoagulation   Parkinson disease ([place]) - continue home meds   HTN (hypertension) - continue home meds  All the records are reviewed and case discussed with ED provider. Management plans discussed with the patient  and/or family.  DVT PROPHYLAXIS: Systemic anticoagulation  GI PROPHYLAXIS: None  ADMISSION STATUS: Inpatient  CODE STATUS: Full Code Status History    This patient does not have a recorded code status. Please follow your organizational policy for patients in this situation.      TOTAL TIME TAKING CARE OF THIS PATIENT: 45 minutes.   Jannifer Franklin, Jakhia Buxton [place] 06/18/2016, 10:34 PM  [facility]  Office  [phone]  CC: Primary care physician; Leonel Ramsay, MD  Note:  This document was prepared using Dragon voice recognition software and may include unintentional dictation errors.

## 2016-06-18 NOTE — ED Triage Notes (Signed)
Pt to triage via w/c; family reports pt has not ate or drank in several days; dx with afib with DVT that moved to PE; d/c home with xarelto; denies pain, c/o nausea

## 2016-06-18 NOTE — ED Notes (Addendum)
Pink sleeve placed on left arm. Patient had a shuffling gait with two assist. Patient able to maintain body in an upright position.

## 2016-06-18 NOTE — ED Notes (Signed)
Olivia Mackie ED tech at bedside for In and Out cath.

## 2016-06-18 NOTE — ED Notes (Signed)
Patient transported to CT scan . 

## 2016-06-19 ENCOUNTER — Inpatient Hospital Stay
Admit: 2016-06-19 | Discharge: 2016-06-19 | Disposition: A | Discharge status: Home / Self Care | Payer: Medicare Other | Attending: Internal Medicine | Admitting: Internal Medicine

## 2016-06-19 LAB — CBC
HEMATOCRIT: 31.5 % — AB (ref 35.0–47.0)
Hemoglobin: 10.7 g/dL — ABNORMAL LOW (ref 12.0–16.0)
MCH: 30.4 pg (ref 26.0–34.0)
MCHC: 33.8 g/dL (ref 32.0–36.0)
MCV: 89.9 fL (ref 80.0–100.0)
PLATELETS: 278 10*3/uL (ref 150–440)
RBC: 3.5 MIL/uL — ABNORMAL LOW (ref 3.80–5.20)
RDW: 13.4 % (ref 11.5–14.5)
WBC: 3.1 10*3/uL — AB (ref 3.6–11.0)

## 2016-06-19 LAB — BASIC METABOLIC PANEL
Anion gap: 8 (ref 5–15)
BUN: 14 mg/dL (ref 6–20)
CALCIUM: 7.4 mg/dL — AB (ref 8.9–10.3)
CO2: 23 mmol/L (ref 22–32)
CREATININE: 0.49 mg/dL (ref 0.44–1.00)
Chloride: 106 mmol/L (ref 101–111)
GFR calc Af Amer: >60 mL/min (ref >60)
GLUCOSE: 88 mg/dL (ref 65–99)
Potassium: 4.1 mmol/L (ref 3.5–5.1)
SODIUM: 137 mmol/L (ref 135–145)

## 2016-06-19 MED ORDER — POLYVINYL ALCOHOL 1.4 % OP SOLN
1.0000 [drp] | Freq: Three times a day (TID) | OPHTHALMIC | Status: DC | PRN
  Filled 2016-06-19: qty 15

## 2016-06-19 MED ORDER — ACETAMINOPHEN 325 MG PO TABS: 650.0000 mg | ORAL_TABLET | Freq: Four times a day (QID) | ORAL | Status: DC | PRN

## 2016-06-19 MED ORDER — DEXTROSE 5 % IV SOLN
1.0000 g | INTRAVENOUS | Status: DC
  Administered 2016-06-19 – 2016-06-20 (×2): 1 g via INTRAVENOUS
  Filled 2016-06-19 (×3): qty 10

## 2016-06-19 MED ORDER — RIVAROXABAN 15 MG PO TABS
15.0000 mg | ORAL_TABLET | Freq: Two times a day (BID) | ORAL | Status: DC
  Administered 2016-06-19 – 2016-06-21 (×5): 15 mg via ORAL
  Filled 2016-06-19 (×5): qty 1

## 2016-06-19 MED ORDER — HYPROMELLOSE (GONIOSCOPIC) 2.5 % OP SOLN: 1.0000 [drp] | Freq: Three times a day (TID) | OPHTHALMIC | Status: DC | PRN

## 2016-06-19 MED ORDER — METOPROLOL SUCCINATE ER 100 MG PO TB24: 100.0000 mg | ORAL_TABLET | Freq: Once | ORAL | Status: DC

## 2016-06-19 MED ORDER — DIGOXIN 250 MCG PO TABS: 0.2500 mg | ORAL_TABLET | Freq: Every day | ORAL | Status: DC

## 2016-06-19 MED ORDER — CARBIDOPA-LEVODOPA ER 50-200 MG PO TBCR
3.0000 | EXTENDED_RELEASE_TABLET | Freq: Three times a day (TID) | ORAL | Status: DC
  Filled 2016-06-19 (×2): qty 3

## 2016-06-19 MED ORDER — CARBIDOPA-LEVODOPA ER 25-100 MG PO TBCR
6.0000 | EXTENDED_RELEASE_TABLET | Freq: Three times a day (TID) | ORAL | Status: DC
  Administered 2016-06-19 – 2016-06-21 (×7): 6 via ORAL
  Filled 2016-06-19 (×8): qty 6

## 2016-06-19 MED ORDER — FESOTERODINE FUMARATE ER 4 MG PO TB24
4.0000 mg | ORAL_TABLET | Freq: Every day | ORAL | Status: DC
  Administered 2016-06-19 – 2016-06-21 (×3): 4 mg via ORAL
  Filled 2016-06-19 (×3): qty 1

## 2016-06-19 MED ORDER — BOOST PLUS PO LIQD
237.0000 mL | Freq: Three times a day (TID) | ORAL | Status: DC
  Administered 2016-06-19 – 2016-06-20 (×3): 237 mL via ORAL

## 2016-06-19 MED ORDER — METOPROLOL SUCCINATE ER 100 MG PO TB24
100.0000 mg | ORAL_TABLET | Freq: Every day | ORAL | Status: DC
  Administered 2016-06-19: 100 mg via ORAL
  Filled 2016-06-19: qty 1

## 2016-06-19 MED ORDER — ONDANSETRON HCL 4 MG PO TABS: 4.0000 mg | ORAL_TABLET | Freq: Four times a day (QID) | ORAL | Status: DC | PRN

## 2016-06-19 MED ORDER — DILTIAZEM HCL 60 MG PO TABS
60.0000 mg | ORAL_TABLET | Freq: Three times a day (TID) | ORAL | Status: DC
  Administered 2016-06-19 – 2016-06-21 (×6): 60 mg via ORAL
  Filled 2016-06-19 (×6): qty 1

## 2016-06-19 MED ORDER — LABETALOL HCL 5 MG/ML IV SOLN
5.0000 mg | INTRAVENOUS | Status: DC | PRN
  Administered 2016-06-19: 5 mg via INTRAVENOUS
  Filled 2016-06-19: qty 4

## 2016-06-19 MED ORDER — DIGOXIN 125 MCG PO TABS
0.1250 mg | ORAL_TABLET | Freq: Every day | ORAL | Status: DC
Start: 2016-06-19 — End: 2016-06-21
  Administered 2016-06-19 – 2016-06-21 (×3): 0.125 mg via ORAL
  Filled 2016-06-19 (×3): qty 1

## 2016-06-19 MED ORDER — ACETAMINOPHEN 650 MG RE SUPP: 650.0000 mg | Freq: Four times a day (QID) | RECTAL | Status: DC | PRN

## 2016-06-19 MED ORDER — SODIUM CHLORIDE 0.9 % IV SOLN
INTRAVENOUS | Status: DC
  Administered 2016-06-19: via INTRAVENOUS

## 2016-06-19 MED ORDER — ONDANSETRON HCL 4 MG/2ML IJ SOLN
4.0000 mg | Freq: Four times a day (QID) | INTRAMUSCULAR | Status: DC | PRN
  Administered 2016-06-19: 4 mg via INTRAVENOUS
  Filled 2016-06-19: qty 2

## 2016-06-19 NOTE — Evaluation (Signed)
Physical Therapy Evaluation Patient Details Name: Laura Cisneros MRN: [id number] DOB: 06/28/1925 Today's Date: 06/19/2016   History of Present Illness  Pt admitted for complaints of weakness. History includes DVT, PE, anemia, OA, HTN, and Parkinson's dx. Pt now diagnosed with UTI.  Clinical Impression  Pt is a pleasant 83 year old female who was admitted for complaints of weakness, now with UTI. Pt performs bed mobility with supervision and transfers/ambulation with cga and RW. Pt demonstrates deficits with strength/mobility/endurance. Pt very fatigued this date and is having dry heaves. Limited in evaluation at this time. Pt with very supportive family at bedside. Would benefit from skilled PT to address above deficits and promote optimal return to PLOF. Recommend transition to [place] upon discharge from acute hospitalization.       Follow Up Recommendations Home health PT (also requesting aide assistance)    Equipment Recommendations  None recommended by PT    Recommendations for Other Services       Precautions / Restrictions Precautions Precautions: Fall Restrictions Weight Bearing Restrictions: No      Mobility  Bed Mobility Overal bed mobility: Needs Assistance Bed Mobility: Supine to Sit     Supine to sit: Supervision     General bed mobility comments: safe technique given time to perform task. Pt slow, however able to sit at EOB with upright posture. Nausea reported with increased activity.  Transfers Overall transfer level: Needs assistance Equipment used: Rolling walker (2 wheeled) Transfers: Sit to/from Stand Sit to Stand: Min guard         General transfer comment: safe technique performed with upright posture and correct use of RW. Upright posture noted  Ambulation/Gait Ambulation/Gait assistance: Min guard Ambulation Distance (Feet): 5 Feet Assistive device: Rolling walker (2 wheeled) Gait Pattern/deviations: Step-to pattern     General Gait  Details: ambulated using RW and safe technique. Slow speed noted and pt fatigues quickly. Nausea increased with exertion, cold cloth given.  Stairs            Wheelchair Mobility    Modified Rankin (Stroke Patients Only)       Balance Overall balance assessment: Needs assistance Sitting-balance support: Feet supported Sitting balance-Leahy Scale: Normal     Standing balance support: Bilateral upper extremity supported Standing balance-Leahy Scale: Good                               Pertinent Vitals/Pain Pain Assessment: Faces Faces Pain Scale: Hurts a little bit Pain Location: complains of nausea; RN aware Pain Descriptors / Indicators: Discomfort Pain Intervention(s): Limited activity within patient's tolerance;Premedicated before session    Home Living Family/patient expects to be discharged to:: Private residence Living Arrangements: Children Available Help at Discharge: Family Type of Home: House Home Access: Level entry     Home Layout: One level Home Equipment: Environmental consultant - 2 wheels      Prior Function Level of Independence: Independent with assistive device(s)         Comments: uses RW for all mobility. Reports no falls at this time.     Hand Dominance        Extremity/Trunk Assessment   Upper Extremity Assessment Upper Extremity Assessment: Overall WFL for tasks assessed    Lower Extremity Assessment Lower Extremity Assessment: Generalized weakness (B LE grossly 4/5)       Communication   Communication: No difficulties  Cognition Arousal/Alertness: Awake/alert Behavior During Therapy: WFL for tasks assessed/performed Overall Cognitive  Status: Within Functional Limits for tasks assessed                                        General Comments      Exercises     Assessment/Plan    PT Assessment Patient needs continued PT services  PT Problem List Decreased strength;Decreased activity  tolerance;Decreased balance;Decreased mobility       PT Treatment Interventions Gait training;DME instruction;Therapeutic exercise    PT Goals (Current goals can be found in the Care Plan section)  Acute Rehab PT Goals Patient Stated Goal: to go home PT Goal Formulation: With patient Time For Goal Achievement: 07/03/16 Potential to Achieve Goals: Good    Frequency Min 2X/week   Barriers to discharge        Co-evaluation               AM-PAC PT "6 Clicks" Daily Activity  Outcome Measure Difficulty turning over in bed (including adjusting bedclothes, sheets and blankets)?: A Little Difficulty moving from lying on back to sitting on the side of the bed? : A Little Difficulty sitting down on and standing up from a chair with arms (e.g., wheelchair, bedside commode, etc,.)?: Total Help needed moving to and from a bed to chair (including a wheelchair)?: A Little Help needed walking in hospital room?: A Little Help needed climbing 3-5 steps with a railing? : A Lot 6 Click Score: 15    End of Session Equipment Utilized During Treatment: Gait belt Activity Tolerance: Patient tolerated treatment well Patient left: in chair;with chair alarm set;with family/visitor present Nurse Communication: Mobility status PT Visit Diagnosis: Unsteadiness on feet (R26.81);Muscle weakness (generalized) (M62.81);Difficulty in walking, not elsewhere classified (R26.2)    Time: 1448-1510 PT Time Calculation (min) (ACUTE ONLY): 22 min   Charges:   PT Evaluation $PT Eval Moderate Complexity: 1 Procedure     PT G Codes:        Greggory Stallion, PT, DPT [phone]   Ovide Dusek 06/19/2016, 3:58 PM

## 2016-06-19 NOTE — Progress Notes (Signed)
[place] at [facility]                                                                                                                                                                                  Patient Demographics   Laura Cisneros, is a 83 y.o. female, DOB - 04/04/1925, YQM:[id number]  Admit date - 06/18/2016   Admitting Physician Lance Coon, MD  Outpatient Primary MD for the patient is Leonel Ramsay, MD   LOS - 1  Subjective: Patient admitted with generalized weakness noted to have UTI and elevated heart rate. Patient recently diagnosed with pulmonary embolism and newly diagnosed A. fib    Review of Systems:   CONSTITUTIONAL: No documented fever. Positive fatigue, positive weakness. No weight gain, no weight loss.  EYES: No blurry or double vision.  ENT: No tinnitus. No postnasal drip. No redness of the oropharynx.  RESPIRATORY: No cough, no wheeze, no hemoptysis. No dyspnea.  CARDIOVASCULAR: No chest pain. No orthopnea. No palpitations. No syncope.  GASTROINTESTINAL: No nausea, no vomiting or diarrhea. No abdominal pain. No melena or hematochezia.  GENITOURINARY: No dysuria or hematuria.  ENDOCRINE: No polyuria or nocturia. No heat or cold intolerance.  HEMATOLOGY: No anemia. No bruising. No bleeding.  INTEGUMENTARY: No rashes. No lesions.  MUSCULOSKELETAL: No arthritis. No swelling. No gout.  NEUROLOGIC: No numbness, tingling, or ataxia. No seizure-type activity.  PSYCHIATRIC: No anxiety. No insomnia. No ADD.    Vitals:   Vitals:   06/19/16 0946 06/19/16 1209 06/19/16 1350 06/19/16 1434  BP: 97/80 114/80 115/87   Pulse: (!) 118 70 75 (!) 105  Resp:  18    Temp:  98 F (36.7 C)    TempSrc:  Oral    SpO2:  (!) 87% 92%   Weight:      Height:        Wt Readings from Last 3 Encounters:  06/19/16 140 lb 11.2 oz (63.8 kg)  05/17/16 144 lb 3.2 oz (65.4 kg)  02/27/16 144 lb 9.6 oz (65.6 kg)     Intake/Output Summary (Last 24  hours) at 06/19/16 1518 Last data filed at 06/19/16 1358  Gross per 24 hour  Intake              240 ml  Output                0 ml  Net              240 ml    Physical Exam:   GENERAL: Pleasant-appearing in no apparent distress.  HEAD, EYES, EARS, NOSE AND THROAT: Atraumatic, normocephalic. Extraocular muscles are intact. Pupils equal and reactive  to light. Sclerae anicteric. No conjunctival injection. No oro-pharyngeal erythema.  NECK: Supple. There is no jugular venous distention. No bruits, no lymphadenopathy, no thyromegaly.  HEART: Irregularly irregular,. No murmurs, no rubs, no clicks.  LUNGS: Clear to auscultation bilaterally. No rales or rhonchi. No wheezes.  ABDOMEN: Soft, flat, nontender, nondistended. Has good bowel sounds. No hepatosplenomegaly appreciated.  EXTREMITIES: No evidence of any cyanosis, clubbing, or peripheral edema.  +2 pedal and radial pulses bilaterally.  NEUROLOGIC: The patient is alert, awake, and oriented x3 with no focal motor or sensory deficits appreciated bilaterally.  SKIN: Moist and warm with no rashes appreciated.  Psych: Not anxious, depressed LN: No inguinal LN enlargement    Antibiotics   Anti-infectives    Start     Dose/Rate Route Frequency Ordered Stop   06/19/16 2200  cefTRIAXone (ROCEPHIN) 1 g in dextrose 5 % 50 mL IVPB     1 g 100 mL/hr over 30 Minutes Intravenous Every 24 hours 06/19/16 1249     06/18/16 2300  cefTRIAXone (ROCEPHIN) 1 g in dextrose 5 % 50 mL IVPB  Status:  Discontinued     1 g 100 mL/hr over 30 Minutes Intravenous Every 24 hours 06/18/16 2212 06/19/16 1249      Medications   Scheduled Meds: . Carbidopa-Levodopa ER  6 tablet Oral TID  . digoxin  0.125 mg Oral Daily  . diltiazem  60 mg Oral Q8H  . fesoterodine  4 mg Oral Daily  . lactose free nutrition  237 mL Oral TID WC  . Rivaroxaban  15 mg Oral BID   Continuous Infusions: . cefTRIAXone (ROCEPHIN) IVPB 1 gram/50 mL D5W     PRN Meds:.acetaminophen  **OR** acetaminophen, labetalol, ondansetron **OR** ondansetron (ZOFRAN) IV, polyvinyl alcohol   Data Review:   Micro Results No results found for this or any previous visit (from the past 240 hour(s)).  Radiology Reports Ct Head Wo Contrast  Result Date: 06/18/2016 CLINICAL DATA:  Gait instability, generalized weakness and confusion. History of Parkinson's, hypertension, breast cancer. EXAM: CT HEAD WITHOUT CONTRAST TECHNIQUE: Contiguous axial images were obtained from the base of the skull through the vertex without intravenous contrast. COMPARISON:  MRI of the head Jun 15, 2004 FINDINGS: BRAIN: No intraparenchymal hemorrhage, mass effect nor midline shift. The ventricles and sulci are normal for age. Confluent supratentorial white matter hypodensities. No acute large vascular territory infarcts. No abnormal extra-axial fluid collections. Basal cisterns are patent. VASCULAR: Mild calcific atherosclerosis of the carotid siphons. SKULL: No skull fracture. No significant scalp soft tissue swelling. SINUSES/ORBITS: The mastoid air-cells and included paranasal sinuses are well-aerated.The included ocular globes and orbital contents are non-suspicious. Status post bilateral ocular lens implants. OTHER: None. IMPRESSION: No acute intracranial process. Moderate to severe chronic small vessel ischemic disease. Electronically Signed   By: Elon Alas M.D.   On: 06/18/2016 20:57   Ct Angio Chest Pe W Or Wo Contrast  Result Date: 06/05/2016 CLINICAL DATA:  Shortness of breath and history of breast cancer. EXAM: CT ANGIOGRAPHY CHEST WITH CONTRAST TECHNIQUE: Multidetector CT imaging of the chest was performed using the standard protocol during bolus administration of intravenous contrast. Multiplanar CT image reconstructions and MIPs were obtained to evaluate the vascular anatomy. CONTRAST:  75 mL Isovue 370 IV COMPARISON:  None. FINDINGS: Cardiovascular: Contrast injection is sufficient to demonstrate  satisfactory opacification of the pulmonary arteries to the segmental level. There is acute embolus within the right lower lobe pulmonary artery extending into the anterior, lateral and posterior basal  segmental arteries. There is an additional filling defect within the right upper lobe anterior segmental artery. The main pulmonary artery is within normal limits for size. There is no CT evidence of acute right heart strain. There is aortic atherosclerotic calcification. There is a normal 3-vessel arch branching pattern. Heart size is normal, without pericardial effusion. Mediastinum/Nodes: There is a medium-sized hiatal hernia. No enlarged or abnormal density mediastinal lymph nodes. Lungs/Pleura: There are large right and small left pleural effusions. There is associated subsegmental atelectasis. Upper Abdomen: Contrast bolus timing is not optimized for evaluation of the abdominal organs. Within this limitation, the visualized organs of the upper abdomen are normal. Musculoskeletal: No chest wall abnormality. No acute or significant osseous findings. Review of the MIP images confirms the above findings. IMPRESSION: 1. Acute pulmonary embolus of the right lower lobar artery extending into the anterior, lateral and posterior basal segmental arteries within the right upper lobe anterior segmental artery. 2. No CT evidence of acute right heart strain. 3. Large right and small left pleural effusions. 4. Aortic atherosclerosis. Critical Value/emergent results were called by telephone at the time of interpretation on 06/05/2016 at 6:32 pm to Dr. Caryl Comes , who verbally acknowledged these results. Electronically Signed   By: Ulyses Jarred M.D.   On: 06/05/2016 18:32   US Venous Img Lower Unilateral Right  Result Date: 06/05/2016 CLINICAL DATA:  Lower extremity edema EXAM: RIGHT LOWER EXTREMITY VENOUS DUPLEX ULTRASOUND TECHNIQUE: Gray-scale sonography with graded compression, as well as color Doppler and duplex ultrasound  were performed to evaluate the right lower extremity deep venous system from the level of the common femoral vein and including the common femoral, femoral, profunda femoral, popliteal and calf veins including the posterior tibial, peroneal and gastrocnemius veins when visible. The superficial great saphenous vein was also interrogated. Spectral Doppler was utilized to evaluate flow at rest and with distal augmentation maneuvers in the common femoral, femoral and popliteal veins. COMPARISON:  None. FINDINGS: Contralateral Common Femoral Vein: Respiratory phasicity is normal and symmetric with the symptomatic side. No evidence of thrombus. Normal compressibility. Common Femoral Vein: No evidence of thrombus. Normal compressibility, respiratory phasicity and response to augmentation. There is pulsatile flow. Saphenofemoral Junction: No evidence of thrombus. Normal compressibility and flow on color Doppler imaging. There is pulsatile flow. Profunda Femoral Vein: No evidence of thrombus. Normal compressibility and flow on color Doppler imaging. There is pulsatile flow. Femoral Vein: No evidence of thrombus. Normal compressibility, respiratory phasicity and response to augmentation. Popliteal Vein: No evidence of thrombus. Normal compressibility, respiratory phasicity and response to augmentation. Calf Veins: There is evidence of acute deep venous thrombosis in a portion of the right posterior tibial vein as well as throughout the peroneal vein on the right. There is loss of Doppler signal in this area as well as loss of compression and augmentation in these vessels. Superficial Great Saphenous Vein: No evidence of thrombus. Normal compressibility and flow on color Doppler imaging. Venous Reflux:  None. Other Findings:  None. IMPRESSION: Acute deep venous thrombosis in right lower extremity calf veins. No deep venous thrombosis above the knee evident on the right. Pulsatile flow is noted in several venous structures  raising question of underlying congestive heart failure. Left common femoral vein patent. These results will be called to the ordering clinician or representative by the Radiologist Assistant, and communication documented in the PACS or zVision Dashboard. Electronically Signed   By: Lowella Grip III M.D.   On: 06/05/2016 17:05   Dg Chest Portable  1 View  Result Date: 06/18/2016 CLINICAL DATA:  Weakness EXAM: PORTABLE CHEST 1 VIEW COMPARISON:  06/05/2016 FINDINGS: Small left greater than right pleural effusions with left basilar atelectasis or pneumonia and patchy atelectasis or infiltrate at the right base. Cardiomegaly with mild central vascular congestion. Large hiatal hernia. Aortic atherosclerosis. No pneumothorax. IMPRESSION: 1. Small left greater than right pleural effusions with bibasilar atelectasis or infiltrates. 2. Moderate cardiomegaly with central vascular congestion Electronically Signed   By: Donavan Foil M.D.   On: 06/18/2016 20:42     CBC  Recent Labs Lab 06/18/16 1956 06/19/16 0456  WBC 3.8 3.1*  HGB 11.4* 10.7*  HCT 34.7* 31.5*  PLT 335 278  MCV 90.6 89.9  MCH 29.8 30.4  MCHC 32.9 33.8  RDW 13.4 13.4  LYMPHSABS 1.3  --   MONOABS 0.7  --   EOSABS 0.0  --   BASOSABS 0.0  --     Chemistries   Recent Labs Lab 06/17/16 1325 06/18/16 1956 06/19/16 0456  NA 137 135 137  K 4.0 4.0 4.1  CL 103 100* 106  CO2 27 26 23   GLUCOSE 100* 96 88  BUN 16 16 14   CREATININE 0.66 0.45 0.49  CALCIUM 8.9 8.0* 7.4*  AST  --  18  --   ALT  --  <5*  --   ALKPHOS  --  40  --   BILITOT  --  0.8  --    ------------------------------------------------------------------------------------------------------------------ estimated creatinine clearance is 38.7 mL/min (by C-G formula based on SCr of 0.49 mg/dL). ------------------------------------------------------------------------------------------------------------------ No results for input(s): HGBA1C in the last 72  hours. ------------------------------------------------------------------------------------------------------------------ No results for input(s): CHOL, HDL, LDLCALC, TRIG, CHOLHDL, LDLDIRECT in the last 72 hours. ------------------------------------------------------------------------------------------------------------------ No results for input(s): TSH, T4TOTAL, T3FREE, THYROIDAB in the last 72 hours.  Invalid input(s): FREET3 ------------------------------------------------------------------------------------------------------------------ No results for input(s): VITAMINB12, FOLATE, FERRITIN, TIBC, IRON, RETICCTPCT in the last 72 hours.  Coagulation profile No results for input(s): INR, PROTIME in the last 168 hours.  No results for input(s): DDIMER in the last 72 hours.  Cardiac Enzymes  Recent Labs Lab 06/18/16 1956  TROPONINI <0.03   ------------------------------------------------------------------------------------------------------------------ Invalid input(s): POCBNP    Assessment & Plan  Patient is a 83 year old being admitted with generalized weakness noted to have UTI and A. fib  1.  UTI (urinary tract infection) - IV antibiotics, stop IV fluid  2.  AF (paroxysmal atrial fibrillation) (HCC) - patient's heart rate is still not under control I will change to oral Cardizem and start patient on digoxin due to low blood pressure, new onset will obtain ECHOCARDIOGRAM of the heart 3.   PE (pulmonary thromboembolism) ([place]) - diagnosed about 1 week ago, continue anticoagulation 4.  Parkinson disease ([place]) - continue home meds 5. HTN (hypertension) -due to low blood pressure will hold blood pressure medication 6. Bilateral pleural effusions on the CT Will obtain echo of the heart      Code Status Orders        Start     Ordered   06/19/16 0028  Full code  Continuous     06/19/16 0027    Code Status History    Date Active Date Inactive Code Status Order ID Comments  User Context   This patient has a current code status but no historical code status.    Advance Directive Documentation     Most Recent Value  Type of Advance Directive  Healthcare Power of Attorney [[name] Nagengast]  Pre-existing out of facility  DNR order (yellow form or pink MOST form)  -  "MOST" Form in Place?  -           Consults  cardiology  DVT Prophylaxis  xarelto  Lab Results  Component Value Date   PLT 278 06/19/2016     Time Spent in minutes   2min  Greater than 50% of time spent in care coordination and counseling patient regarding the condition and plan of care.   Dustin Flock M.D on 06/19/2016 at 3:18 PM  Between 7am to 6pm - Pager - [phone]  After 6pm go to [email or web address] - password [email or web address] [facility]   Office  [phone]

## 2016-06-19 NOTE — Care Management (Signed)
Patient admitted with weakness and uti.  found to be in atrial fib with RVR.  Recent diagnosis of pulmonary embolus and DVT.  Was started on Xarelto.  History of parkinson.  T and cardiology consults pending

## 2016-06-19 NOTE — Progress Notes (Signed)
Pt admitted to room 250, A &O X4; forgetful, from home with her son Jeneen Rinks, family at bedside, HR in the 120-160's not sustaining, MD Jannifer Franklin made aware. Fluids have been increased to 100 ml/hr and Toprol XL 100 mg was administered per MD's order. Will continue to monitor.

## 2016-06-20 LAB — CBC
HCT: 33.5 % — ABNORMAL LOW (ref 35.0–47.0)
Hemoglobin: 11 g/dL — ABNORMAL LOW (ref 12.0–16.0)
MCH: 29.5 pg (ref 26.0–34.0)
MCHC: 33 g/dL (ref 32.0–36.0)
MCV: 89.6 fL (ref 80.0–100.0)
Platelets: 312 10*3/uL (ref 150–440)
RBC: 3.74 MIL/uL — ABNORMAL LOW (ref 3.80–5.20)
RDW: 13.7 % (ref 11.5–14.5)
WBC: 4.8 10*3/uL (ref 3.6–11.0)

## 2016-06-20 LAB — ECHOCARDIOGRAM COMPLETE
HEIGHTINCHES: 59 in
WEIGHTICAEL: 2251.2 [oz_av]

## 2016-06-20 LAB — BASIC METABOLIC PANEL
Anion gap: 6 (ref 5–15)
BUN: 16 mg/dL (ref 6–20)
CALCIUM: 7.6 mg/dL — AB (ref 8.9–10.3)
CHLORIDE: 102 mmol/L (ref 101–111)
CO2: 28 mmol/L (ref 22–32)
CREATININE: 0.45 mg/dL (ref 0.44–1.00)
GFR calc non Af Amer: >60 mL/min (ref >60)
Glucose, Bld: 125 mg/dL — ABNORMAL HIGH (ref 65–99)
Potassium: 3.5 mmol/L (ref 3.5–5.1)
SODIUM: 136 mmol/L (ref 135–145)

## 2016-06-20 LAB — CORTISOL: CORTISOL PLASMA: 23.3 ug/dL

## 2016-06-20 LAB — MAGNESIUM: MAGNESIUM: 1.7 mg/dL (ref 1.7–2.4)

## 2016-06-20 MED ORDER — MIDODRINE HCL 5 MG PO TABS
5.0000 mg | ORAL_TABLET | Freq: Three times a day (TID) | ORAL | Status: DC
  Administered 2016-06-20 – 2016-06-21 (×4): 5 mg via ORAL
  Filled 2016-06-20 (×4): qty 1

## 2016-06-20 MED ORDER — GUAIFENESIN-DM 100-10 MG/5ML PO SYRP: 5.0000 mL | ORAL_SOLUTION | ORAL | Status: DC | PRN

## 2016-06-20 NOTE — Progress Notes (Signed)
[place] at [facility]                                                                                                                                                                                  Patient Demographics   Laura Cisneros, is a 83 y.o. female, DOB - 19-May-1925, OMV:[id number]  Admit date - 06/18/2016   Admitting Physician Lance Coon, MD  Outpatient Primary MD for the patient is Leonel Ramsay, MD   LOS - 2  Subjective: Patient feeling better blood pressure still low  Review of Systems:   CONSTITUTIONAL: No documented fever. Positive fatigue, positive weakness. No weight gain, no weight loss.  EYES: No blurry or double vision.  ENT: No tinnitus. No postnasal drip. No redness of the oropharynx.  RESPIRATORY: No cough, no wheeze, no hemoptysis. No dyspnea.  CARDIOVASCULAR: No chest pain. No orthopnea. No palpitations. No syncope.  GASTROINTESTINAL: No nausea, no vomiting or diarrhea. No abdominal pain. No melena or hematochezia.  GENITOURINARY: No dysuria or hematuria.  ENDOCRINE: No polyuria or nocturia. No heat or cold intolerance.  HEMATOLOGY: No anemia. No bruising. No bleeding.  INTEGUMENTARY: No rashes. No lesions.  MUSCULOSKELETAL: No arthritis. No swelling. No gout.  NEUROLOGIC: No numbness, tingling, or ataxia. No seizure-type activity.  PSYCHIATRIC: No anxiety. No insomnia. No ADD.    Vitals:   Vitals:   06/19/16 1931 06/20/16 0522 06/20/16 0839 06/20/16 1118  BP: 103/87 (!) 90/57 127/79 114/71  Pulse: (!) 102 71 (!) 106 93  Resp: 18 20 18 18   Temp: 97.6 F (36.4 C) 97.5 F (36.4 C) 97.5 F (36.4 C)   TempSrc: Oral Oral Oral   SpO2: 95% 91% 96% 90%  Weight:      Height:        Wt Readings from Last 3 Encounters:  06/19/16 140 lb 11.2 oz (63.8 kg)  05/17/16 144 lb 3.2 oz (65.4 kg)  02/27/16 144 lb 9.6 oz (65.6 kg)     Intake/Output Summary (Last 24 hours) at 06/20/16 1235 Last data filed at 06/20/16 0932   Gross per 24 hour  Intake              290 ml  Output              150 ml  Net              140 ml    Physical Exam:   GENERAL: Pleasant-appearing in no apparent distress.  HEAD, EYES, EARS, NOSE AND THROAT: Atraumatic, normocephalic. Extraocular muscles are intact. Pupils equal and reactive to light. Sclerae anicteric. No conjunctival injection. No oro-pharyngeal erythema.  NECK: Supple. There is  no jugular venous distention. No bruits, no lymphadenopathy, no thyromegaly.  HEART: Irregularly irregular,. No murmurs, no rubs, no clicks.  LUNGS: Clear to auscultation bilaterally. No rales or rhonchi. No wheezes.  ABDOMEN: Soft, flat, nontender, nondistended. Has good bowel sounds. No hepatosplenomegaly appreciated.  EXTREMITIES: No evidence of any cyanosis, clubbing, or peripheral edema.  +2 pedal and radial pulses bilaterally.  NEUROLOGIC: The patient is alert, awake, and oriented x3 with no focal motor or sensory deficits appreciated bilaterally.  SKIN: Moist and warm with no rashes appreciated.  Psych: Not anxious, depressed LN: No inguinal LN enlargement    Antibiotics   Anti-infectives    Start     Dose/Rate Route Frequency Ordered Stop   06/19/16 2200  cefTRIAXone (ROCEPHIN) 1 g in dextrose 5 % 50 mL IVPB     1 g 100 mL/hr over 30 Minutes Intravenous Every 24 hours 06/19/16 1249     06/18/16 2300  cefTRIAXone (ROCEPHIN) 1 g in dextrose 5 % 50 mL IVPB  Status:  Discontinued     1 g 100 mL/hr over 30 Minutes Intravenous Every 24 hours 06/18/16 2212 06/19/16 1249      Medications   Scheduled Meds: . Carbidopa-Levodopa ER  6 tablet Oral TID  . digoxin  0.125 mg Oral Daily  . diltiazem  60 mg Oral Q8H  . fesoterodine  4 mg Oral Daily  . lactose free nutrition  237 mL Oral TID WC  . midodrine  5 mg Oral TID WC  . Rivaroxaban  15 mg Oral BID   Continuous Infusions: . cefTRIAXone (ROCEPHIN) IVPB 1 gram/50 mL D5W Stopped (06/19/16 2151)   PRN Meds:.acetaminophen **OR**  acetaminophen, guaiFENesin-dextromethorphan, ondansetron **OR** ondansetron (ZOFRAN) IV, polyvinyl alcohol   Data Review:   Micro Results Recent Results (from the past 240 hour(s))  Urine culture     Status: Abnormal (Preliminary result)   Collection Time: 06/18/16  8:49 PM  Result Value Ref Range Status   Specimen Description URINE, RANDOM  Final   Special Requests NONE  Final   Culture (A)  Final    >=100,000 COLONIES/mL ESCHERICHIA COLI SUSCEPTIBILITIES TO FOLLOW Performed at [facility] [address] [place], [place] [address]    Report Status PENDING  Incomplete    Radiology Reports Ct Head Wo Contrast  Result Date: 06/18/2016 CLINICAL DATA:  Gait instability, generalized weakness and confusion. History of Parkinson's, hypertension, breast cancer. EXAM: CT HEAD WITHOUT CONTRAST TECHNIQUE: Contiguous axial images were obtained from the base of the skull through the vertex without intravenous contrast. COMPARISON:  MRI of the head Jun 15, 2004 FINDINGS: BRAIN: No intraparenchymal hemorrhage, mass effect nor midline shift. The ventricles and sulci are normal for age. Confluent supratentorial white matter hypodensities. No acute large vascular territory infarcts. No abnormal extra-axial fluid collections. Basal cisterns are patent. VASCULAR: Mild calcific atherosclerosis of the carotid siphons. SKULL: No skull fracture. No significant scalp soft tissue swelling. SINUSES/ORBITS: The mastoid air-cells and included paranasal sinuses are well-aerated.The included ocular globes and orbital contents are non-suspicious. Status post bilateral ocular lens implants. OTHER: None. IMPRESSION: No acute intracranial process. Moderate to severe chronic small vessel ischemic disease. Electronically Signed   By: Elon Alas M.D.   On: 06/18/2016 20:57   Ct Angio Chest Pe W Or Wo Contrast  Result Date: 06/05/2016 CLINICAL DATA:  Shortness of breath and history of breast cancer. EXAM: CT  ANGIOGRAPHY CHEST WITH CONTRAST TECHNIQUE: Multidetector CT imaging of the chest was performed using the standard  protocol during bolus administration of intravenous contrast. Multiplanar CT image reconstructions and MIPs were obtained to evaluate the vascular anatomy. CONTRAST:  75 mL Isovue 370 IV COMPARISON:  None. FINDINGS: Cardiovascular: Contrast injection is sufficient to demonstrate satisfactory opacification of the pulmonary arteries to the segmental level. There is acute embolus within the right lower lobe pulmonary artery extending into the anterior, lateral and posterior basal segmental arteries. There is an additional filling defect within the right upper lobe anterior segmental artery. The main pulmonary artery is within normal limits for size. There is no CT evidence of acute right heart strain. There is aortic atherosclerotic calcification. There is a normal 3-vessel arch branching pattern. Heart size is normal, without pericardial effusion. Mediastinum/Nodes: There is a medium-sized hiatal hernia. No enlarged or abnormal density mediastinal lymph nodes. Lungs/Pleura: There are large right and small left pleural effusions. There is associated subsegmental atelectasis. Upper Abdomen: Contrast bolus timing is not optimized for evaluation of the abdominal organs. Within this limitation, the visualized organs of the upper abdomen are normal. Musculoskeletal: No chest wall abnormality. No acute or significant osseous findings. Review of the MIP images confirms the above findings. IMPRESSION: 1. Acute pulmonary embolus of the right lower lobar artery extending into the anterior, lateral and posterior basal segmental arteries within the right upper lobe anterior segmental artery. 2. No CT evidence of acute right heart strain. 3. Large right and small left pleural effusions. 4. Aortic atherosclerosis. Critical Value/emergent results were called by telephone at the time of interpretation on 06/05/2016 at 6:32  pm to Dr. Caryl Comes , who verbally acknowledged these results. Electronically Signed   By: Ulyses Jarred M.D.   On: 06/05/2016 18:32   US Venous Img Lower Unilateral Right  Result Date: 06/05/2016 CLINICAL DATA:  Lower extremity edema EXAM: RIGHT LOWER EXTREMITY VENOUS DUPLEX ULTRASOUND TECHNIQUE: Gray-scale sonography with graded compression, as well as color Doppler and duplex ultrasound were performed to evaluate the right lower extremity deep venous system from the level of the common femoral vein and including the common femoral, femoral, profunda femoral, popliteal and calf veins including the posterior tibial, peroneal and gastrocnemius veins when visible. The superficial great saphenous vein was also interrogated. Spectral Doppler was utilized to evaluate flow at rest and with distal augmentation maneuvers in the common femoral, femoral and popliteal veins. COMPARISON:  None. FINDINGS: Contralateral Common Femoral Vein: Respiratory phasicity is normal and symmetric with the symptomatic side. No evidence of thrombus. Normal compressibility. Common Femoral Vein: No evidence of thrombus. Normal compressibility, respiratory phasicity and response to augmentation. There is pulsatile flow. Saphenofemoral Junction: No evidence of thrombus. Normal compressibility and flow on color Doppler imaging. There is pulsatile flow. Profunda Femoral Vein: No evidence of thrombus. Normal compressibility and flow on color Doppler imaging. There is pulsatile flow. Femoral Vein: No evidence of thrombus. Normal compressibility, respiratory phasicity and response to augmentation. Popliteal Vein: No evidence of thrombus. Normal compressibility, respiratory phasicity and response to augmentation. Calf Veins: There is evidence of acute deep venous thrombosis in a portion of the right posterior tibial vein as well as throughout the peroneal vein on the right. There is loss of Doppler signal in this area as well as loss of compression and  augmentation in these vessels. Superficial Great Saphenous Vein: No evidence of thrombus. Normal compressibility and flow on color Doppler imaging. Venous Reflux:  None. Other Findings:  None. IMPRESSION: Acute deep venous thrombosis in right lower extremity calf veins. No deep venous thrombosis above the knee evident  on the right. Pulsatile flow is noted in several venous structures raising question of underlying congestive heart failure. Left common femoral vein patent. These results will be called to the ordering clinician or representative by the Radiologist Assistant, and communication documented in the PACS or zVision Dashboard. Electronically Signed   By: Lowella Grip III M.D.   On: 06/05/2016 17:05   Dg Chest Portable 1 View  Result Date: 06/18/2016 CLINICAL DATA:  Weakness EXAM: PORTABLE CHEST 1 VIEW COMPARISON:  06/05/2016 FINDINGS: Small left greater than right pleural effusions with left basilar atelectasis or pneumonia and patchy atelectasis or infiltrate at the right base. Cardiomegaly with mild central vascular congestion. Large hiatal hernia. Aortic atherosclerosis. No pneumothorax. IMPRESSION: 1. Small left greater than right pleural effusions with bibasilar atelectasis or infiltrates. 2. Moderate cardiomegaly with central vascular congestion Electronically Signed   By: Donavan Foil M.D.   On: 06/18/2016 20:42     CBC  Recent Labs Lab 06/18/16 1956 06/19/16 0456 06/20/16 0552  WBC 3.8 3.1* 4.8  HGB 11.4* 10.7* 11.0*  HCT 34.7* 31.5* 33.5*  PLT 335 278 312  MCV 90.6 89.9 89.6  MCH 29.8 30.4 29.5  MCHC 32.9 33.8 33.0  RDW 13.4 13.4 13.7  LYMPHSABS 1.3  --   --   MONOABS 0.7  --   --   EOSABS 0.0  --   --   BASOSABS 0.0  --   --     Chemistries   Recent Labs Lab 06/17/16 1325 06/18/16 1956 06/19/16 0456 06/20/16 0552  NA 137 135 137 136  K 4.0 4.0 4.1 3.5  CL 103 100* 106 102  CO2 27 26 23 28   GLUCOSE 100* 96 88 125*  BUN 16 16 14 16   CREATININE 0.66 0.45  0.49 0.45  CALCIUM 8.9 8.0* 7.4* 7.6*  AST  --  18  --   --   ALT  --  <5*  --   --   ALKPHOS  --  40  --   --   BILITOT  --  0.8  --   --    ------------------------------------------------------------------------------------------------------------------ estimated creatinine clearance is 38.7 mL/min (by C-G formula based on SCr of 0.45 mg/dL). ------------------------------------------------------------------------------------------------------------------ No results for input(s): HGBA1C in the last 72 hours. ------------------------------------------------------------------------------------------------------------------ No results for input(s): CHOL, HDL, LDLCALC, TRIG, CHOLHDL, LDLDIRECT in the last 72 hours. ------------------------------------------------------------------------------------------------------------------ No results for input(s): TSH, T4TOTAL, T3FREE, THYROIDAB in the last 72 hours.  Invalid input(s): FREET3 ------------------------------------------------------------------------------------------------------------------ No results for input(s): VITAMINB12, FOLATE, FERRITIN, TIBC, IRON, RETICCTPCT in the last 72 hours.  Coagulation profile No results for input(s): INR, PROTIME in the last 168 hours.  No results for input(s): DDIMER in the last 72 hours.  Cardiac Enzymes  Recent Labs Lab 06/18/16 1956  TROPONINI <0.03   ------------------------------------------------------------------------------------------------------------------ Invalid input(s): POCBNP    Assessment & Plan  Patient is a 83 year old being admitted with generalized weakness noted to have UTI and A. fib  1.  UTI (urinary tract infection) - Continue IV anabiotic's urine culture pending 2.  AF (paroxysmal atrial fibrillation) (HCC) - heart rate improved, continue current regimen 3.   PE (pulmonary thromboembolism) ([place]) - diagnosed about 1 week ago, continue anticoagulation 4.   Parkinson disease ([place]) - continue home meds 5. HTN (hypertension) -due to low blood pressure blood pressure medication on hold but I will start patient on [place] 6. Bilateral pleural effusions on the CT echo results currently pending      Code Status Orders  Start     Ordered   06/19/16 0028  Full code  Continuous     06/19/16 0027    Code Status History    Date Active Date Inactive Code Status Order ID Comments User Context   This patient has a current code status but no historical code status.    Advance Directive Documentation     Most Recent Value  Type of Advance Directive  Healthcare Power of Attorney [[name] Dolce]  Pre-existing out of facility DNR order (yellow form or pink MOST form)  -  "MOST" Form in Place?  -           Consults  cardiology  DVT Prophylaxis  xarelto  Lab Results  Component Value Date   PLT 312 06/20/2016     Time Spent in minutes   66min  Greater than 50% of time spent in care coordination and counseling patient regarding the condition and plan of care.   Dustin Flock M.D on 06/20/2016 at 12:35 PM  Between 7am to 6pm - Pager - [phone]  After 6pm go to [email or web address] - password [email or web address] [facility]   Office  [phone]

## 2016-06-20 NOTE — Care Management (Signed)
Until present illness, patient was able to "clean the house" and all of her adls.  One of her sons live with her. No  issues accessing medical care, obtaining medications or with transportation.  Current with her PCP.  has had [facility] services in the past.  Provided patient- son with Medicaid PCS request form in the event patient will require assist with adls after illness.  Heads up referral to [facility] for RN Aide and PT.  has "a house full of walkers."

## 2016-06-21 LAB — BASIC METABOLIC PANEL
ANION GAP: 6 (ref 5–15)
BUN: 14 mg/dL (ref 6–20)
CO2: 28 mmol/L (ref 22–32)
Calcium: 7.7 mg/dL — ABNORMAL LOW (ref 8.9–10.3)
Chloride: 103 mmol/L (ref 101–111)
Creatinine, Ser: 0.39 mg/dL — ABNORMAL LOW (ref 0.44–1.00)
GFR calc Af Amer: >60 mL/min (ref >60)
GLUCOSE: 87 mg/dL (ref 65–99)
POTASSIUM: 3.5 mmol/L (ref 3.5–5.1)
Sodium: 137 mmol/L (ref 135–145)

## 2016-06-21 LAB — URINE CULTURE: Culture: >=100000 — AB

## 2016-06-21 MED ORDER — DIGOXIN 125 MCG PO TABS: 0.1250 mg | ORAL_TABLET | Freq: Every day | ORAL | 0 refills | Status: DC

## 2016-06-21 MED ORDER — CEFUROXIME AXETIL 500 MG PO TABS: 500.0000 mg | ORAL_TABLET | Freq: Two times a day (BID) | ORAL | 0 refills | Status: AC

## 2016-06-21 MED ORDER — DILTIAZEM HCL ER COATED BEADS 120 MG PO CP24: 120.0000 mg | ORAL_CAPSULE | Freq: Every day | ORAL | 0 refills | Status: DC

## 2016-06-21 MED ORDER — MIDODRINE HCL 2.5 MG PO TABS: 5.0000 mg | ORAL_TABLET | Freq: Three times a day (TID) | ORAL | 0 refills | Status: DC

## 2016-06-21 NOTE — Progress Notes (Signed)
ANTIBIOTIC CONSULT NOTE - Follow up  Pharmacy Consult for Ceftriaxone  Indication: UTI  No Known Allergies  Patient Measurements: Height: 4\' 11"  (149.9 cm) Weight: 140 lb 11.2 oz (63.8 kg) IBW/kg (Calculated) : 43.2 Adjusted Body Weight:   Vital Signs: Temp: 98.6 F (37 C) (05/25 0757) Temp Source: Oral (05/25 0757) BP: 128/71 (05/25 0757) Pulse Rate: 79 (05/25 0757) Intake/Output from previous day: 05/24 0701 - 05/25 0700 In: 120 [P.O.:120] Out: 150 [Urine:150] Intake/Output from this shift: Total I/O In: 240 [P.O.:240] Out: 0   Labs:  Recent Labs  06/18/16 1956 06/19/16 0456 06/20/16 0552 06/21/16 0501  WBC 3.8 3.1* 4.8  --   HGB 11.4* 10.7* 11.0*  --   PLT 335 278 312  --   CREATININE 0.45 0.49 0.45 0.39*   Estimated Creatinine Clearance: 38.7 mL/min (A) (by C-G formula based on SCr of 0.39 mg/dL (L)). No results for input(s): VANCOTROUGH, VANCOPEAK, VANCORANDOM, GENTTROUGH, GENTPEAK, GENTRANDOM, TOBRATROUGH, TOBRAPEAK, TOBRARND, AMIKACINPEAK, AMIKACINTROU, AMIKACIN in the last 72 hours.   Microbiology: Recent Results (from the past 720 hour(s))  Urine culture     Status: Abnormal   Collection Time: 06/18/16  8:49 PM  Result Value Ref Range Status   Specimen Description URINE, RANDOM  Final   Special Requests NONE  Final   Culture >=100,000 COLONIES/mL ESCHERICHIA COLI (A)  Final   Report Status 06/21/2016 FINAL  Final   Organism ID, Bacteria ESCHERICHIA COLI (A)  Final      Susceptibility   Escherichia coli - MIC*    AMPICILLIN 8 SENSITIVE Sensitive     CEFAZOLIN <=4 SENSITIVE Sensitive     CEFTRIAXONE <=1 SENSITIVE Sensitive     CIPROFLOXACIN <=0.25 SENSITIVE Sensitive     GENTAMICIN <=1 SENSITIVE Sensitive     IMIPENEM 0.5 SENSITIVE Sensitive     NITROFURANTOIN <=16 SENSITIVE Sensitive     TRIMETH/SULFA <=20 SENSITIVE Sensitive     AMPICILLIN/SULBACTAM 4 SENSITIVE Sensitive     PIP/TAZO <=4 SENSITIVE Sensitive     Extended ESBL NEGATIVE  Sensitive     * >=100,000 COLONIES/mL ESCHERICHIA COLI    Medical History: Past Medical History:  Diagnosis Date  . Anemia   . Arthritis    osteoarthritis  . Atrophic vaginitis   . Cancer ([place])    Breast  . Cataract   . Diverticulosis   . Hiatal hernia   . Hypertension   . Incontinence   . Osteoporosis   . Parkinson disease ([place])   . Vitamin B12 deficiency    Assessment: 83 yo female on ceftriaxone for UTI  Goal of Therapy:  resolution of infection  Plan:  Expected duration 7 days with resolution of temperature and/or normalization of WBC   Ceftriaxone 1 gm IV Q24H ordered to start on 5/22 @ 23:00.   Rayna Sexton L 06/21/2016,12:10 PM

## 2016-06-21 NOTE — Discharge Summary (Signed)
[place] [facility], 83 y.o., DOB [birth year], MRN [id number]. Admission date: 06/18/2016 Discharge Date 06/21/2016 Primary MD Leonel Ramsay, MD Admitting Physician Lance Coon, MD  Admission Diagnosis  Weakness [R53.1] UTI (urinary tract infection) [N39.0]  Discharge Diagnosis   Principal Problem:   UTI (urinary tract infection)   Atrial fibrillation with rapid ventricular rate   Parkinson disease (HCC)   HTN (hypertension)   AF (paroxysmal atrial fibrillation) (HCC)   Hypotension   Severe tricuspid regurg   Mild systolic dysfunction           Hospital Course  Laura Cisneros  is a 83 y.o. female who presents with persistent malaise, fatigue.  Last week she was diagnosed with DVT and PE.  Also a new diagnosis of a fib at that time.  On evaluation in ED patient was noted to have urinary tract infection as well as was dehydrated and noted to be atrial fibrillation with rapid ventricular rate. Her blood pressure was also low. She was admitted to the hospital for further evaluation and therapy. Her urine culture showed Escherichia coli. Her atrial fibrillation was under poor control therefore her medications were adjusted. She had low blood pressure for which she was started on  Midrodine. Patient has home health being arranged for her to go home with. She is continuing anticoagulation for pulmonary embolism as well as atrial fibrillation.         Consults  cardiology  Significant Tests:  See full reports for all details     Ct Head Wo Contrast  Result Date: 06/18/2016 CLINICAL DATA:  Gait instability, generalized weakness and confusion. History of Parkinson's, hypertension, breast cancer. EXAM: CT HEAD WITHOUT CONTRAST TECHNIQUE: Contiguous axial images were obtained from the base of the skull through the vertex without intravenous contrast. COMPARISON:  MRI of the head Jun 15, 2004 FINDINGS: BRAIN: No intraparenchymal hemorrhage,  mass effect nor midline shift. The ventricles and sulci are normal for age. Confluent supratentorial white matter hypodensities. No acute large vascular territory infarcts. No abnormal extra-axial fluid collections. Basal cisterns are patent. VASCULAR: Mild calcific atherosclerosis of the carotid siphons. SKULL: No skull fracture. No significant scalp soft tissue swelling. SINUSES/ORBITS: The mastoid air-cells and included paranasal sinuses are well-aerated.The included ocular globes and orbital contents are non-suspicious. Status post bilateral ocular lens implants. OTHER: None. IMPRESSION: No acute intracranial process. Moderate to severe chronic small vessel ischemic disease. Electronically Signed   By: Elon Alas M.D.   On: 06/18/2016 20:57   Ct Angio Chest Pe W Or Wo Contrast  Result Date: 06/05/2016 CLINICAL DATA:  Shortness of breath and history of breast cancer. EXAM: CT ANGIOGRAPHY CHEST WITH CONTRAST TECHNIQUE: Multidetector CT imaging of the chest was performed using the standard protocol during bolus administration of intravenous contrast. Multiplanar CT image reconstructions and MIPs were obtained to evaluate the vascular anatomy. CONTRAST:  75 mL Isovue 370 IV COMPARISON:  None. FINDINGS: Cardiovascular: Contrast injection is sufficient to demonstrate satisfactory opacification of the pulmonary arteries to the segmental level. There is acute embolus within the right lower lobe pulmonary artery extending into the anterior, lateral and posterior basal segmental arteries. There is an additional filling defect within the right upper lobe anterior segmental artery. The main pulmonary artery is within normal limits for size. There is no CT evidence of acute right heart strain. There is aortic atherosclerotic calcification. There is a normal 3-vessel arch branching pattern. Heart size is normal, without pericardial effusion.  Mediastinum/Nodes: There is a medium-sized hiatal hernia. No enlarged or  abnormal density mediastinal lymph nodes. Lungs/Pleura: There are large right and small left pleural effusions. There is associated subsegmental atelectasis. Upper Abdomen: Contrast bolus timing is not optimized for evaluation of the abdominal organs. Within this limitation, the visualized organs of the upper abdomen are normal. Musculoskeletal: No chest wall abnormality. No acute or significant osseous findings. Review of the MIP images confirms the above findings. IMPRESSION: 1. Acute pulmonary embolus of the right lower lobar artery extending into the anterior, lateral and posterior basal segmental arteries within the right upper lobe anterior segmental artery. 2. No CT evidence of acute right heart strain. 3. Large right and small left pleural effusions. 4. Aortic atherosclerosis. Critical Value/emergent results were called by telephone at the time of interpretation on 06/05/2016 at 6:32 pm to Dr. Caryl Comes , who verbally acknowledged these results. Electronically Signed   By: Ulyses Jarred M.D.   On: 06/05/2016 18:32   US Venous Img Lower Unilateral Right  Result Date: 06/05/2016 CLINICAL DATA:  Lower extremity edema EXAM: RIGHT LOWER EXTREMITY VENOUS DUPLEX ULTRASOUND TECHNIQUE: Gray-scale sonography with graded compression, as well as color Doppler and duplex ultrasound were performed to evaluate the right lower extremity deep venous system from the level of the common femoral vein and including the common femoral, femoral, profunda femoral, popliteal and calf veins including the posterior tibial, peroneal and gastrocnemius veins when visible. The superficial great saphenous vein was also interrogated. Spectral Doppler was utilized to evaluate flow at rest and with distal augmentation maneuvers in the common femoral, femoral and popliteal veins. COMPARISON:  None. FINDINGS: Contralateral Common Femoral Vein: Respiratory phasicity is normal and symmetric with the symptomatic side. No evidence of thrombus. Normal  compressibility. Common Femoral Vein: No evidence of thrombus. Normal compressibility, respiratory phasicity and response to augmentation. There is pulsatile flow. Saphenofemoral Junction: No evidence of thrombus. Normal compressibility and flow on color Doppler imaging. There is pulsatile flow. Profunda Femoral Vein: No evidence of thrombus. Normal compressibility and flow on color Doppler imaging. There is pulsatile flow. Femoral Vein: No evidence of thrombus. Normal compressibility, respiratory phasicity and response to augmentation. Popliteal Vein: No evidence of thrombus. Normal compressibility, respiratory phasicity and response to augmentation. Calf Veins: There is evidence of acute deep venous thrombosis in a portion of the right posterior tibial vein as well as throughout the peroneal vein on the right. There is loss of Doppler signal in this area as well as loss of compression and augmentation in these vessels. Superficial Great Saphenous Vein: No evidence of thrombus. Normal compressibility and flow on color Doppler imaging. Venous Reflux:  None. Other Findings:  None. IMPRESSION: Acute deep venous thrombosis in right lower extremity calf veins. No deep venous thrombosis above the knee evident on the right. Pulsatile flow is noted in several venous structures raising question of underlying congestive heart failure. Left common femoral vein patent. These results will be called to the ordering clinician or representative by the Radiologist Assistant, and communication documented in the PACS or zVision Dashboard. Electronically Signed   By: Lowella Grip III M.D.   On: 06/05/2016 17:05   Dg Chest Portable 1 View  Result Date: 06/18/2016 CLINICAL DATA:  Weakness EXAM: PORTABLE CHEST 1 VIEW COMPARISON:  06/05/2016 FINDINGS: Small left greater than right pleural effusions with left basilar atelectasis or pneumonia and patchy atelectasis or infiltrate at the right base. Cardiomegaly with mild central  vascular congestion. Large hiatal hernia. Aortic atherosclerosis. No pneumothorax. IMPRESSION:  1. Small left greater than right pleural effusions with bibasilar atelectasis or infiltrates. 2. Moderate cardiomegaly with central vascular congestion Electronically Signed   By: Donavan Foil M.D.   On: 06/18/2016 20:42       Today   Subjective:   Laura Cisneros  feels better  Objective:   Blood pressure 128/71, pulse 79, temperature 98.6 F (37 C), temperature source Oral, resp. rate 17, height 4\' 11"  (1.499 m), weight 140 lb 11.2 oz (63.8 kg), SpO2 94 %.  .  Intake/Output Summary (Last 24 hours) at 06/21/16 1431 Last data filed at 06/21/16 1044  Gross per 24 hour  Intake              240 ml  Output                0 ml  Net              240 ml    Exam VITAL SIGNS: Blood pressure 128/71, pulse 79, temperature 98.6 F (37 C), temperature source Oral, resp. rate 17, height 4\' 11"  (1.499 m), weight 140 lb 11.2 oz (63.8 kg), SpO2 94 %.  GENERAL:  83 y.o.-year-old patient lying in the bed with no acute distress.  EYES: Pupils equal, round, reactive to light and accommodation. No scleral icterus. Extraocular muscles intact.  HEENT: Head atraumatic, normocephalic. Oropharynx and nasopharynx clear.  NECK:  Supple, no jugular venous distention. No thyroid enlargement, no tenderness.  LUNGS: Normal breath sounds bilaterally, no wheezing, rales,rhonchi or crepitation. No use of accessory muscles of respiration.  CARDIOVASCULAR: S1, S2 normal. No murmurs, rubs, or gallops.  ABDOMEN: Soft, nontender, nondistended. Bowel sounds present. No organomegaly or mass.  EXTREMITIES: No pedal edema, cyanosis, or clubbing.  NEUROLOGIC: Cranial nerves II through XII are intact. Muscle strength 5/5 in all extremities. Sensation intact. Gait not checked.  PSYCHIATRIC: The patient is alert and oriented x 3.  SKIN: No obvious rash, lesion, or ulcer.   Data Review     CBC w Diff: Lab Results  Component Value  Date   WBC 4.8 06/20/2016   HGB 11.0 (L) 06/20/2016   HGB 14.3 08/16/2013   HCT 33.5 (L) 06/20/2016   HCT 43.7 08/16/2013   PLT 312 06/20/2016   PLT 240 08/16/2013   LYMPHOPCT 35 06/18/2016   LYMPHOPCT 33.7 08/16/2013   MONOPCT 19 06/18/2016   MONOPCT 11.4 08/16/2013   EOSPCT 1 06/18/2016   EOSPCT 3.9 08/16/2013   BASOPCT 1 06/18/2016   BASOPCT 1.4 08/16/2013   CMP: Lab Results  Component Value Date   NA 137 06/21/2016   NA 141 08/16/2013   K 3.5 06/21/2016   K 3.3 (L) 08/16/2013   CL 103 06/21/2016   CL 106 08/16/2013   CO2 28 06/21/2016   CO2 27 08/16/2013   BUN 14 06/21/2016   BUN 18 08/16/2013   CREATININE 0.39 (L) 06/21/2016   CREATININE 0.68 08/16/2013   PROT 6.4 (L) 06/18/2016   PROT 7.3 08/16/2013   ALBUMIN 3.2 (L) 06/18/2016   ALBUMIN 3.4 08/16/2013   BILITOT 0.8 06/18/2016   BILITOT 0.5 08/16/2013   ALKPHOS 40 06/18/2016   ALKPHOS 76 08/16/2013   AST 18 06/18/2016   AST 19 08/16/2013   ALT <5 (L) 06/18/2016   ALT 12 08/16/2013  .  Micro Results Recent Results (from the past 240 hour(s))  Urine culture     Status: Abnormal   Collection Time: 06/18/16  8:49 PM  Result Value Ref Range Status  Specimen Description URINE, RANDOM  Final   Special Requests NONE  Final   Culture >=100,000 COLONIES/mL ESCHERICHIA COLI (A)  Final   Report Status 06/21/2016 FINAL  Final   Organism ID, Bacteria ESCHERICHIA COLI (A)  Final      Susceptibility   Escherichia coli - MIC*    AMPICILLIN 8 SENSITIVE Sensitive     CEFAZOLIN <=4 SENSITIVE Sensitive     CEFTRIAXONE <=1 SENSITIVE Sensitive     CIPROFLOXACIN <=0.25 SENSITIVE Sensitive     GENTAMICIN <=1 SENSITIVE Sensitive     IMIPENEM 0.5 SENSITIVE Sensitive     NITROFURANTOIN <=16 SENSITIVE Sensitive     TRIMETH/SULFA <=20 SENSITIVE Sensitive     AMPICILLIN/SULBACTAM 4 SENSITIVE Sensitive     PIP/TAZO <=4 SENSITIVE Sensitive     Extended ESBL NEGATIVE Sensitive     * >=100,000 COLONIES/mL ESCHERICHIA COLI         Code Status Orders        Start     Ordered   06/19/16 0028  Full code  Continuous     06/19/16 0027    Code Status History    Date Active Date Inactive Code Status Order ID Comments User Context   This patient has a current code status but no historical code status.    Advance Directive Documentation     Most Recent Value  Type of Advance Directive  Healthcare Power of Attorney [[name] Moen]  Pre-existing out of facility DNR order (yellow form or pink MOST form)  -  "MOST" Form in Place?  -          [place] [place] Follow up.   Why:  Nurse, physical therapy and aide Contact information: [address] [place] [address] [phone]           Discharge Medications   Allergies as of 06/21/2016   No Known Allergies     Medication List    STOP taking these medications   hydrochlorothiazide 12.5 MG capsule Commonly known as:  MICROZIDE   metoprolol succinate 100 MG 24 hr tablet Commonly known as:  TOPROL-XL     TAKE these medications   acetaminophen 500 MG tablet Commonly known as:  TYLENOL Take 500 mg by mouth every 6 (six) hours as needed (pain).   carbidopa-levodopa 50-200 MG tablet Commonly known as:  SINEMET CR Take 3 tablets by mouth 3 (three) times daily.   cefUROXime 500 MG tablet Commonly known as:  CEFTIN Take 1 tablet (500 mg total) by mouth 2 (two) times daily with a meal.   conjugated estrogens vaginal cream Commonly known as:  PREMARIN Place 1 Applicatorful vaginally daily. Apply 0.5mg  (pea-sized amount)  just inside the vaginal introitus with a finger-tip every night for two weeks and then Monday, Wednesday and Friday nights. What changed:  when to take this  additional instructions   cyanocobalamin 1000 MCG/ML injection Commonly known as:  (VITAMIN B-12) INJECT 1ML INTO MUSCLE ONCE A MONTH AS DIRECTED   digoxin 0.125 MG tablet Commonly known as:  LANOXIN Take 1  tablet (0.125 mg total) by mouth daily.   diltiazem 120 MG 24 hr capsule Commonly known as:  CARDIZEM CD Take 1 capsule (120 mg total) by mouth daily.   fesoterodine 4 MG Tb24 tablet Commonly known as:  TOVIAZ Take 1 tablet (4 mg total) by mouth daily.   hydroxypropyl methylcellulose / hypromellose 2.5 % ophthalmic solution Commonly known as:  ISOPTO TEARS /  GONIOVISC Place 1 drop into both eyes 3 (three) times daily as needed for dry eyes.   midodrine 2.5 MG tablet Commonly known as:  PROAMATINE Take 2 tablets (5 mg total) by mouth 3 (three) times daily with meals.   mirabegron ER 25 MG Tb24 tablet Commonly known as:  MYRBETRIQ Take 1 tablet (25 mg total) by mouth daily.   XARELTO 15 MG Tabs tablet Generic drug:  Rivaroxaban Take 15 mg by mouth 2 (two) times daily.          Total Time in preparing paper work, data evaluation and todays exam - 35 minutes  Dustin Flock M.D on 06/21/2016 at 2:31 PM  [facility]   Office  [phone]

## 2016-06-21 NOTE — Care Management (Signed)
[facility] is aware of the discharge.  Explained the printed application for medicaid pcs services to son [name]

## 2016-06-21 NOTE — Progress Notes (Signed)
Discharge instructions along with home medication list and follow up gone over with patient and son. Home medication list gone over in great detail with son. Both verbalized that they understood instructions. Son made aware prescriptions were electronically submitted to pharmacy. Iv removed and telemetry removed. Patient to be discharged home on ra. No distress noted.

## 2016-06-21 NOTE — Care Management Important Message (Signed)
Important Message  Patient Details  Name: Laura Cisneros MRN: [id number] Date of Birth: 1925-02-06   Medicare Important Message Given:  Yes Signed IM notice given    Katrina Stack, RN 06/21/2016, 9:09 AM

## 2016-06-25 ENCOUNTER — Other Ambulatory Visit: Payer: Self-pay | Admitting: Cardiology

## 2016-06-25 DIAGNOSIS — R1310 Dysphagia, unspecified: Secondary | ICD-10-CM

## 2016-07-01 ENCOUNTER — Ambulatory Visit
Admission: RE | Admit: 2016-07-01 | Discharge: 2016-07-01 | Disposition: A | Discharge status: Home / Self Care | Payer: Medicare Other | Source: Ambulatory Visit | Attending: Cardiology | Admitting: Cardiology

## 2016-07-01 DIAGNOSIS — R1310 Dysphagia, unspecified: Secondary | ICD-10-CM | POA: Diagnosis present

## 2016-07-01 DIAGNOSIS — K449 Diaphragmatic hernia without obstruction or gangrene: Secondary | ICD-10-CM | POA: Diagnosis not present

## 2016-07-03 ENCOUNTER — Other Ambulatory Visit: Payer: Self-pay | Admitting: Gastroenterology

## 2016-07-03 DIAGNOSIS — R1312 Dysphagia, oropharyngeal phase: Secondary | ICD-10-CM

## 2016-07-04 ENCOUNTER — Emergency Department: Payer: Medicare Other

## 2016-07-04 ENCOUNTER — Encounter: Payer: Self-pay | Admitting: Emergency Medicine

## 2016-07-04 ENCOUNTER — Emergency Department
Admission: EM | Admit: 2016-07-04 | Discharge: 2016-07-04 | Disposition: A | Discharge status: Home / Self Care | Payer: Medicare Other | Attending: Emergency Medicine | Admitting: Emergency Medicine

## 2016-07-04 DIAGNOSIS — R627 Adult failure to thrive: Secondary | ICD-10-CM

## 2016-07-04 DIAGNOSIS — Z79810 Long term (current) use of selective estrogen receptor modulators (SERMs): Secondary | ICD-10-CM | POA: Diagnosis not present

## 2016-07-04 DIAGNOSIS — Z7901 Long term (current) use of anticoagulants: Secondary | ICD-10-CM | POA: Insufficient documentation

## 2016-07-04 DIAGNOSIS — E860 Dehydration: Secondary | ICD-10-CM

## 2016-07-04 DIAGNOSIS — R531 Weakness: Secondary | ICD-10-CM | POA: Diagnosis present

## 2016-07-04 DIAGNOSIS — Z853 Personal history of malignant neoplasm of breast: Secondary | ICD-10-CM | POA: Diagnosis not present

## 2016-07-04 DIAGNOSIS — I10 Essential (primary) hypertension: Secondary | ICD-10-CM | POA: Diagnosis not present

## 2016-07-04 LAB — CBC
HCT: 35.1 % (ref 35.0–47.0)
Hemoglobin: 11.8 g/dL — ABNORMAL LOW (ref 12.0–16.0)
MCH: 29.3 pg (ref 26.0–34.0)
MCHC: 33.7 g/dL (ref 32.0–36.0)
MCV: 87.2 fL (ref 80.0–100.0)
PLATELETS: 282 10*3/uL (ref 150–440)
RBC: 4.03 MIL/uL (ref 3.80–5.20)
RDW: 14.2 % (ref 11.5–14.5)
WBC: 4.7 10*3/uL (ref 3.6–11.0)

## 2016-07-04 LAB — URINALYSIS, COMPLETE (UACMP) WITH MICROSCOPIC
Bacteria, UA: NONE SEEN
Bilirubin Urine: NEGATIVE
GLUCOSE, UA: NEGATIVE mg/dL
HGB URINE DIPSTICK: NEGATIVE
KETONES UR: 20 mg/dL — AB
Leukocytes, UA: NEGATIVE
NITRITE: NEGATIVE
PH: 6 (ref 5.0–8.0)
PROTEIN: NEGATIVE mg/dL
Specific Gravity, Urine: 1.021 (ref 1.005–1.030)

## 2016-07-04 LAB — BASIC METABOLIC PANEL
ANION GAP: 10 (ref 5–15)
BUN: 12 mg/dL (ref 6–20)
CALCIUM: 7.7 mg/dL — AB (ref 8.9–10.3)
CO2: 26 mmol/L (ref 22–32)
CREATININE: 0.51 mg/dL (ref 0.44–1.00)
Chloride: 102 mmol/L (ref 101–111)
Glucose, Bld: 87 mg/dL (ref 65–99)
Potassium: 4 mmol/L (ref 3.5–5.1)
SODIUM: 138 mmol/L (ref 135–145)

## 2016-07-04 MED ORDER — SODIUM CHLORIDE 0.9 % IV BOLUS (SEPSIS)
500.0000 mL | Freq: Once | INTRAVENOUS | Status: AC
  Administered 2016-07-04: 500 mL via INTRAVENOUS

## 2016-07-04 MED ORDER — DEXTROSE 5 % IV SOLN
500.0000 mL | Freq: Once | INTRAVENOUS | Status: AC
  Administered 2016-07-04: 500 mL via INTRAVENOUS

## 2016-07-04 NOTE — ED Triage Notes (Signed)
Son says pt has not been eating, sob, nausea.  No fever.  Says she woke up this am with shaking all over--wose than usual--has parkinsons.

## 2016-07-04 NOTE — Progress Notes (Signed)
[facility]  Patient Status: Active  [facility] is providing the following services: SN/PT/HHA  If patient discharges after hours, please call [phone].   Laura Cisneros 07/04/2016, 1:40 PM

## 2016-07-04 NOTE — ED Notes (Signed)

## 2016-07-04 NOTE — ED Notes (Signed)
Patient given water for PO challenge.  Patient tolerating well at this time.

## 2016-07-04 NOTE — ED Provider Notes (Signed)
[facility] Emergency Department Provider Note  ____________________________________________  Time seen: Approximately 1:19 PM  I have reviewed the triage vital signs and the nursing notes.   HISTORY  Chief Complaint Nausea; Weakness; Anorexia; and Shaking  Level 5 caveat:  Portions of the history and physical were unable to be obtained due to the patient's poor historian   HPI  -------  [name] is a 83 y.o. female brought to the ED by familyue to generalized weakness. Therefore she has not been eating or drinking much the last several days. This is been an ongoing problem for the past month. She is brought to the hospital before and given IV fluids for hydration. She has home health with nursing and assistance, physical therapy and home. Family reports concern that she just hasn't regained much of her strength yet. No vomiting diarrhea or constipation. Patient denies any pain complaints. This morning she was noted to be shaking or trembling all over which is unusual for her despite her history of Parkinson's.she has been taking all her medications as usual    Past Medical History:  Diagnosis Date  . Anemia   . Arthritis    osteoarthritis  . Atrophic vaginitis   . Cancer ([place])    Breast  . Cataract   . Diverticulosis   . Hiatal hernia   . Hypertension   . Incontinence   . Osteoporosis   . Parkinson disease ([place])   . Vitamin B12 deficiency      Patient Active Problem List   Diagnosis Date Noted  . UTI (urinary tract infection) 06/18/2016  . Parkinson disease ([place]) 06/18/2016  . HTN (hypertension) 06/18/2016  . AF (paroxysmal atrial fibrillation) ([place]) 06/18/2016  . PE (pulmonary thromboembolism) ([place]) 06/18/2016  . Osteoporosis without current pathological fracture 12/04/2015  . Breast cancer of upper-outer quadrant of left female breast ([place]) 11/22/2015     Past Surgical History:  Procedure Laterality Date  . ABDOMINAL  HYSTERECTOMY    . ABDOMINAL HYSTERECTOMY    . BREAST BIOPSY Left 10/2012   benign  . BREAST BIOPSY Left 11/22/2015   invasive mammary ca   . BREAST EXCISIONAL BIOPSY Left 12/08/2015   lumpectomy  . EYE SURGERY Bilateral    Catarct Extraction with IOL  . FOOT FRACTURE SURGERY Right    pinning, Dr. Francia Greaves, [facility]  . INCONTINENCE SURGERY    . PARTIAL MASTECTOMY WITH AXILLARY SENTINEL LYMPH NODE BIOPSY Left 12/08/2015   Procedure: PARTIAL MASTECTOMY WITH AXILLARY SENTINEL LYMPH NODE BIOPSY;  Surgeon: [name], MD;  Location: [facility] ORS;  Service: General;  Laterality: Left;     Prior to Admission medications   Medication Sig Start Date End Date Taking? Authorizing Provider  carbidopa-levodopa (SINEMET CR) 50-200 MG tablet Take 3 tablets by mouth 3 (three) times daily.  10/24/15 10/23/16 Yes [provider]  conjugated estrogens (PREMARIN) vaginal cream Place 1 Applicatorful vaginally daily. Apply 0.5mg  (pea-sized amount)  just inside the vaginal introitus with a finger-tip every night for two weeks and then Monday, Wednesday and Friday nights. Patient taking differently: Place 1 Applicatorful vaginally once a week. On Wednesday 10/12/15  Yes McGowan, Larene Beach A, PA-C  digoxin (LANOXIN) 0.125 MG tablet Take 1 tablet (0.125 mg total) by mouth daily. 06/21/16  Yes Dustin Flock, MD  diltiazem (CARDIZEM CD) 120 MG 24 hr capsule Take 1 capsule (120 mg total) by mouth daily. 06/21/16  Yes Dustin Flock, MD  fesoterodine (TOVIAZ) 4 MG TB24 tablet Take 1 tablet (4  mg total) by mouth daily. 02/27/16  Yes McGowan, Larene Beach A, PA-C  hydroxypropyl methylcellulose / hypromellose (ISOPTO TEARS / GONIOVISC) 2.5 % ophthalmic solution Place 1 drop into both eyes 3 (three) times daily as needed for dry eyes.   Yes [provider]  midodrine (PROAMATINE) 2.5 MG tablet Take 2 tablets (5 mg total) by mouth 3 (three) times daily with meals. 06/21/16  Yes Dustin Flock, MD  omeprazole  (PRILOSEC) 40 MG capsule Take 40 mg by mouth daily.   Yes [provider]  acetaminophen (TYLENOL) 500 MG tablet Take 500 mg by mouth every 6 (six) hours as needed (pain).    [provider]  cyanocobalamin (,VITAMIN B-12,) 1000 MCG/ML injection INJECT 1ML INTO MUSCLE ONCE A MONTH AS DIRECTED 01/04/15   [provider]  mirabegron ER (MYRBETRIQ) 25 MG TB24 tablet Take 1 tablet (25 mg total) by mouth daily. Patient not taking: Reported on 06/18/2016 10/12/15   Zara Council A, PA-C  XARELTO 20 MG TABS tablet Take 20 mg by mouth daily.  06/05/16 06/26/16  [provider]     Allergies Patient has no known allergies.   Family History  Problem Relation Age of Onset  . Esophageal cancer Sister   . Kidney disease Neg Hx   . Bladder Cancer Neg Hx   . Breast cancer Neg Hx     Social History Social History  Substance Use Topics  . Smoking status: Never Smoker  . Smokeless tobacco: Never Used  . Alcohol use No    Review of Systems  Constitutional:   No fever , positive chills.  ENT:   No sore throat. No rhinorrhea. Cardiovascular:   No chest pain or syncope. Respiratory:   No dyspnea or cough. Gastrointestinal:   Negative for abdominal pain, vomiting and diarrhea.  Musculoskeletal:   Negative for focal pain or swelling All other systems reviewed and are negative except as documented above in ROS and HPI.  ____________________________________________   PHYSICAL EXAM:  VITAL SIGNS: ED Triage Vitals  Enc Vitals Group     BP 07/04/16 1130 (!) 156/90     Pulse Rate 07/04/16 1130 87     Resp 07/04/16 1130 19     Temp --      Temp src --      SpO2 07/04/16 1130 95 %     Weight 07/04/16 1044 135 lb (61.2 kg)     Height 07/04/16 1044 4\' 11"  (1.499 m)     Head Circumference --      Peak Flow --      Pain Score --      Pain Loc --      Pain Edu? --      Excl. in [place]? --     Vital signs reviewed, nursing assessments  reviewed.   Constitutional:   Alert and oriented. Well appearing and in no distress. Eyes:   No scleral icterus.  EOMI. No nystagmus. No conjunctival pallor. PERRL. ENT   Head:   Normocephalic and atraumatic.   Nose:   No congestion/rhinnorhea.    Mouth/Throat:   Dry mucous membranes, no pharyngeal erythema. No peritonsillar mass.    Neck:   No meningismus. Full ROM Hematological/Lymphatic/Immunilogical:   No cervical lymphadenopathy. Cardiovascular:   RRR. Symmetric bilateral radial and DP pulses.  No murmurs.  Respiratory:   Normal respiratory effort without tachypnea/retractions. Breath sounds are clear and equal bilaterally. No wheezes/rales/rhonchi. Gastrointestinal:   Soft and nontender. Non distended. There is no CVA  tenderness.  No rebound, rigidity, or guarding. Genitourinary:   deferred Musculoskeletal:   Normal range of motion in all extremities. No joint effusions.  No lower extremity tenderness.  No edema. Neurologic:   Normal speech and language.  Motor grossly intact. No gross focal neurologic deficits are appreciated.  Skin:    Skin is warm, dry and intact. No rash noted.  No petechiae, purpura, or bullae.  ____________________________________________    LABS (pertinent positives/negatives) (all labs ordered are listed, but only abnormal results are displayed) Labs Reviewed  BASIC METABOLIC PANEL - Abnormal; Notable for the following:       Result Value   Calcium 7.7 (*)    All other components within normal limits  CBC - Abnormal; Notable for the following:    Hemoglobin 11.8 (*)    All other components within normal limits  URINALYSIS, COMPLETE (UACMP) WITH MICROSCOPIC - Abnormal; Notable for the following:    Color, Urine AMBER (*)    APPearance CLEAR (*)    Ketones, ur 20 (*)    Squamous Epithelial / LPF 0-5 (*)    All other components within normal limits   ____________________________________________   EKG  Atrial fibrillation rate of  111, normal axis and intervals. Normal QRS and ST segments. T wave inversions in V4 through V6.3 PVCs on the strip.not significantly changed from previous EKG 06/22/2016.  ____________________________________________    RADIOLOGY  Dg Chest 2 View  Result Date: 07/04/2016 CLINICAL DATA:  Increased heart rate, generalize weakness and tremors differ from usual Parkinson's disease. History of breast malignancy and hypertension. Nonsmoker. EXAM: CHEST  2 VIEW COMPARISON:  Chest x-ray of May 22nd 2018 FINDINGS: The lungs are adequately inflated. The interstitial markings are chronically increased. There is a large hiatal hernia. The cardiac silhouette remains enlarged. The pulmonary vascularity is not engorged. There is tortuosity of the ascending and descending thoracic aorta. There is multilevel degenerative disc disease of the thoracic spine without compression fracture. There is stable dextrocurvature centered in the lower thoracic spine. IMPRESSION: Chronic bronchitic changes, stable. Stable cardiomegaly without pulmonary edema. Large hiatal hernia. Thoracic aortic atherosclerosis. Electronically Signed   By: David  Martinique M.D.   On: 07/04/2016 12:39    ____________________________________________   PROCEDURES Procedures  ____________________________________________   INITIAL IMPRESSION / ASSESSMENT AND PLAN / ED COURSE  Pertinent labs & imaging results that were available during my care of the patient were reviewed by me and considered in my medical decision making (see chart for details).  Patient brought to ED for evaluation of generalized weakness.She appears to be chronically ill but not acutely so. Her examination is unremarkable as are her vital signs. Labs are unremarkable. Consistent with failure to thrive and mild dehydration due to poor oral intake. She is given IV fluids in the ED. We will do a trial of oral intake to ensure that she can tolerate and plan for outpatient follow-up.  She has extensive in-home resources already arranged and can continue to follow up with primary care.no evidence of meningitis encephalitis stroke ACS PE dissection AAA or intra-abdominal pathology.   ----------------------------------------- 2:49 PM on 07/04/2016 -----------------------------------------  Patient is calm and comfortable with stable vital signs. Feeling improved after IV fluids. Workup negative. We'll discharge home to follow up with primary care. Tolerating oral water.     ____________________________________________   FINAL CLINICAL IMPRESSION(S) / ED DIAGNOSES  Final diagnoses:  Failure to thrive in adult  Mild dehydration      New Prescriptions   No  medications on file     Portions of this note were generated with dragon dictation software. Dictation errors may occur despite best attempts at proofreading.    Carrie Mew, MD 07/04/16 [phone]

## 2016-07-16 ENCOUNTER — Ambulatory Visit
Admission: RE | Admit: 2016-07-16 | Discharge: 2016-07-16 | Disposition: A | Discharge status: Home / Self Care | Payer: Medicare Other | Source: Ambulatory Visit | Attending: Gastroenterology | Admitting: Gastroenterology

## 2016-07-16 DIAGNOSIS — R1314 Dysphagia, pharyngoesophageal phase: Secondary | ICD-10-CM

## 2016-07-16 DIAGNOSIS — R1310 Dysphagia, unspecified: Secondary | ICD-10-CM | POA: Diagnosis not present

## 2016-07-16 DIAGNOSIS — R1312 Dysphagia, oropharyngeal phase: Secondary | ICD-10-CM

## 2016-07-16 NOTE — Therapy (Signed)
[place], [place], [address] Phone: [phone]   Fax:     Modified Barium Swallow  Patient Details  Name: Laura Cisneros MRN: [id number] Date of Birth: 1925/05/27 No Data Recorded  Encounter Date: 07/16/2016      End of Session - 07/16/16 1612    Visit Number 1   Number of Visits 1   Date for SLP Re-Evaluation 07/16/16   SLP Start Time 105   SLP Stop Time  1350   SLP Time Calculation (min) 60 min   Activity Tolerance Patient tolerated treatment well      Past Medical History:  Diagnosis Date  . Anemia   . Arthritis    osteoarthritis  . Atrophic vaginitis   . Cancer ([place])    Breast  . Cataract   . Diverticulosis   . Hiatal hernia   . Hypertension   . Incontinence   . Osteoporosis   . Parkinson disease ([place])   . Vitamin B12 deficiency     Past Surgical History:  Procedure Laterality Date  . ABDOMINAL HYSTERECTOMY    . ABDOMINAL HYSTERECTOMY    . BREAST BIOPSY Left 10/2012   benign  . BREAST BIOPSY Left 11/22/2015   invasive mammary ca   . BREAST EXCISIONAL BIOPSY Left 12/08/2015   lumpectomy  . EYE SURGERY Bilateral    Catarct Extraction with IOL  . FOOT FRACTURE SURGERY Right    pinning, Dr. Francia Greaves, [facility]  . INCONTINENCE SURGERY    . PARTIAL MASTECTOMY WITH AXILLARY SENTINEL LYMPH NODE BIOPSY Left 12/08/2015   Procedure: PARTIAL MASTECTOMY WITH AXILLARY SENTINEL LYMPH NODE BIOPSY;  Surgeon: [name], MD;  Location: [facility] ORS;  Service: General;  Laterality: Left;    There were no vitals filed for this visit.     Subjective: Patient behavior: (alertness, ability to follow instructions, etc.):  Chief complaint: dysphagia   Objective:  Radiological Procedure: A videoflouroscopic evaluation of oral-preparatory, reflex initiation, and pharyngeal phases of the swallow was performed; as well as a screening of the upper esophageal phase.  I. POSTURE:  upright II. VIEW: left  III. COMPENSATORY STRATEGIES: lingual sweeping w/ f/u, dry swallow IV. BOLUSES ADMINISTERED:   Thin Liquid: 5 trials via cup  Nectar-thick Liquid: 1 trial via cup  Honey-thick Liquid: NT  Puree: 3 trials  Mechanical Soft: NT - not wearing her dentures d/t ill-fitting per pt/Sons V. RESULTS OF EVALUATION: A. ORAL PREPARATORY PHASE: (The lips, tongue, and velum are observed for strength and coordination)       **Overall Severity Rating: grossly WFL- Min deficits d/t being unable to wear her dentures d/t ill-fit. When given trials of liquids and purees, pt exhibited no gross oral phase deficits - timely bolus management noted; transfer; oral clearing. Pt did utilize a f/u, dry swallowing intermittently which was functional in managing any slight-min residue.  B. SWALLOW INITIATION/REFLEX: (The reflex is normal if "triggered" by the time the bolus reached the base of the tongue)  **Overall Severity Rating: [facility]. Timing of the pharyngeal swallow initiation appeared wfl for boluses given in light of pt's age. Adequate timing of airway closure during the swallowing w/ no laryngeal penetration or aspiration noted to occur w/ trials given.   C. PHARYNGEAL PHASE: (Pharyngeal function is normal if the bolus shows rapid, smooth, and continuous transit through the pharynx and there is no pharyngeal residue after the swallow)  **Overall Severity Rating: Minimal. Pt exhibited min residue in pharynx,  moreso in the valleculae w/ purees, which appeared to reduce and/or clear w/ a f/u, dry swallow.   D. LARYNGEAL PENETRATION: (Material entering into the laryngeal inlet/vestibule but not aspirated): NONE E. ASPIRATION: NONE F. ESOPHAGEAL PHASE: (Screening of the upper esophagus): noted Esophageal dysmotility (possible achalasia) in the mid-Esophagus w/ PUREE trials. This could be related to pt's feelings of Esophageal dysmotility she experiences at home per her report.     ASSESSMENT:  PLAN/RECOMMENDATIONS:  A. Diet: PUREE w/ Thin liquids  B. Swallowing Precautions: general aspiration precautions; REFLUX precautions; use of f/u, dry swallow to aid oropharyngeal clearing of any bolus residue during meals  C. Recommended consultation to f/u w/ GI for further education, assessment, and management of Esophageal dysmotility   D. Therapy recommendations: none at this time  E. Results and recommendations were discussed w/ pt and Sons; video viewed; handouts given to pt/Sons on diet consistency, food options/prep, precautions          Dysphagia, pharyngoesophageal phase  Oropharyngeal dysphagia - Plan: DG OP Swallowing Func-Medicare/Speech Path, DG OP Swallowing Func-Medicare/Speech Path      G-Codes - Jul 30, 2016 1613    Functional Assessment Tool Used clinical judgement   Functional Limitations Swallowing   Swallow Current Status (F6213) At least 1 percent but less than 20 percent impaired, limited or restricted   Swallow Goal Status (Y8657) At least 1 percent but less than 20 percent impaired, limited or restricted   Swallow Discharge Status [phone]) At least 1 percent but less than 20 percent impaired, limited or restricted          Problem List Patient Active Problem List   Diagnosis Date Noted  . UTI (urinary tract infection) 06/18/2016  . Parkinson disease ([place]) 06/18/2016  . HTN (hypertension) 06/18/2016  . AF (paroxysmal atrial fibrillation) ([place]) 06/18/2016  . PE (pulmonary thromboembolism) ([place]) 06/18/2016  . Osteoporosis without current pathological fracture 12/04/2015  . Breast cancer of upper-outer quadrant of left female breast ([place]) 11/22/2015     Laura Kenner, MS, CCC-SLP Karey Stucki 07-30-2016, 4:14 PM  [place], [place], [address] Phone: [phone]   Fax:     Name: Laura Cisneros MRN: [id number] Date of Birth: 03-Sep-1925

## 2016-09-06 ENCOUNTER — Other Ambulatory Visit: Payer: Self-pay | Admitting: Surgery

## 2016-09-06 DIAGNOSIS — Z853 Personal history of malignant neoplasm of breast: Secondary | ICD-10-CM

## 2016-09-15 NOTE — Progress Notes (Signed)
[facility] day:  09/16/2016   Chief Complaint: Laura Cisneros is a 83 y.o. female with clinical stage I left breast cancer who is seen for 4 month assessment on tamoxifen.  HPI: The patient was last seen in the medical oncology clinic on 05/17/2016.  At that time, she tired easily.  She had difficulties associated with her Parkinson's disease.  Bilateral mammogram is scheduled for 11/08/2016.  Patient reports admission (06/18/2016 - 06/21/2016) to the hospital for DVT that progressed to a pulmonary embolism. The pulmonary embolism led to patient going into atrial fibrillation. Patient was taken off of her Tamoxifen by Dr. Ubaldo Glassing. Patient is currently taking Xarelto.  Regarding her Parkinson's, patient reports an increase in symptoms. She has been seen in consult by Dr. Manuella Ghazi, who subsequently increased her Sinemet 50-'200mg'$  to TID.  Today, patient reports that she is feeling "pretty good". Patient with increased fatigue. She reports that her heart will "fluctuate" when she ambulates.    Past Medical History:  Diagnosis Date  . Anemia   . Arthritis    osteoarthritis  . Atrophic vaginitis   . Cancer ([place])    Breast  . Cataract   . Diverticulosis   . Hiatal hernia   . Hypertension   . Incontinence   . Osteoporosis   . Parkinson disease ([place])   . Vitamin B12 deficiency     Past Surgical History:  Procedure Laterality Date  . ABDOMINAL HYSTERECTOMY    . ABDOMINAL HYSTERECTOMY    . BREAST BIOPSY Left 10/2012   benign  . BREAST BIOPSY Left 11/22/2015   invasive mammary ca   . BREAST EXCISIONAL BIOPSY Left 12/08/2015   lumpectomy  . EYE SURGERY Bilateral    Catarct Extraction with IOL  . FOOT FRACTURE SURGERY Right    pinning, Dr. Francia Greaves, [facility]  . INCONTINENCE SURGERY    . PARTIAL MASTECTOMY WITH AXILLARY SENTINEL LYMPH NODE BIOPSY Left 12/08/2015   Procedure: PARTIAL MASTECTOMY WITH AXILLARY SENTINEL LYMPH NODE BIOPSY;  Surgeon: [name], MD;  Location: [facility] ORS;  Service: General;  Laterality: Left;    Family History  Problem Relation Age of Onset  . Esophageal cancer Sister   . Kidney disease Neg Hx   . Bladder Cancer Neg Hx   . Breast cancer Neg Hx     Social History:  reports that she has never smoked. She has never used smokeless tobacco. She reports that she does not drink alcohol or use drugs.  She denies any exposure to radiation or toxins.  She previously worked in a [place].  Her son's phone number is [phone].  Her grand daughter is [name].  The patient is accompanied by her son, [name].  Allergies: No Known Allergies  Current Medications: Current Outpatient Prescriptions  Medication Sig Dispense Refill  . acetaminophen (TYLENOL) 500 MG tablet Take 500 mg by mouth every 6 (six) hours as needed (pain).    . carbidopa-levodopa (SINEMET CR) 50-200 MG tablet Take 3 tablets by mouth 3 (three) times daily.     Marland Kitchen conjugated estrogens (PREMARIN) vaginal cream Place 1 Applicatorful vaginally daily. Apply 0.'5mg'$  (pea-sized amount)  just inside the vaginal introitus with a finger-tip every night for two weeks and then Monday, Wednesday and Friday nights. (Patient taking differently: Place 1 Applicatorful vaginally once a week. On Wednesday) 30 g 12  . cyanocobalamin (,VITAMIN B-12,) 1000 MCG/ML injection INJECT 1ML INTO MUSCLE ONCE A MONTH AS DIRECTED    .  digoxin (LANOXIN) 0.125 MG tablet Take 1 tablet (0.125 mg total) by mouth daily. 30 tablet 0  . diltiazem (CARDIZEM CD) 120 MG 24 hr capsule Take 1 capsule (120 mg total) by mouth daily. 30 capsule 0  . fesoterodine (TOVIAZ) 4 MG TB24 tablet Take 1 tablet (4 mg total) by mouth daily. 30 tablet 11  . hydroxypropyl methylcellulose / hypromellose (ISOPTO TEARS / GONIOVISC) 2.5 % ophthalmic solution Place 1 drop into both eyes 3 (three) times daily as needed for dry eyes.    . midodrine (PROAMATINE) 2.5 MG tablet Take 2 tablets (5 mg total) by mouth 3 (three) times  daily with meals. 90 tablet 0  . XARELTO 20 MG TABS tablet Take 20 mg by mouth daily.   0  . omeprazole (PRILOSEC) 40 MG capsule Take 40 mg by mouth daily.     No current facility-administered medications for this visit.     Review of Systems:  GENERAL:  Increased fatigue.  Tires easily.  No fevers or sweats.  Weight down 9 pounds since last clinic visit. PERFORMANCE STATUS (ECOG):  2 HEENT:  No visual changes, runny nose, sore throat, mouth sores or tenderness. Lungs: No shortness of breath or cough.  No hemoptysis.  Interval pulmonary embolism. Cardiac:  No chest pain, palpitations, orthopnea, or PND. GI:  No nausea, vomiting, diarrhea, constipation, melena or hematochezia. GU:  No urgency, frequency, dysuria, or hematuria. h/o recurrent UTIs and vaginal atrophy on estrogen cream sparingly. Musculoskeletal:  No back pain.  No joint pain.  No muscle tenderness. Took Zometa x 5 years. Extremities:  No pain or swelling. Skin:  No rashes or skin changes. Neuro:  Parkinson's disease.  No headache, numbness or weakness,or coordination issues.  Gait is unstable. Endocrine:  No diabetes, thyroid issues, hot flashes or night sweats. Psych:  No mood changes, depression or anxiety. Pain:  No focal pain. Review of systems:  All other systems reviewed and found to be negative.  Physical Exam: Blood pressure (!) 141/70, pulse (!) 50, temperature 97.6 F (36.4 C), temperature source Tympanic, resp. rate 18, weight 135 lb 9.6 oz (61.5 kg). GENERAL:  Thin elderly woman sitting comfortably in the exam room in no acute distress. HEAD:  Curly gray hair.  Normocephalic, atraumatic, face symmetric, no Cushingoid features. EYES:Blueeyes. Pupils equal round and reactive to light and accomodation. No conjunctivitis or scleral icterus. ENT:  Oropharynx clear without lesion.  Dentures.  Tongue normal. Mucous membranes moist. RESPIRATORY:Clear to auscultationwithout rales, wheezes or  rhonchi. CARDIOVASCULAR:Regular rate andrhythmwithout murmur, rub or gallop. BREAST:  Right breast with fibrocystic changes; no nipple discharge.  Left breast with well healed lateral incision.  Fibrocystic changes laterally.  No skin changes or nipple discharge.  ABDOMEN:Soft, non-tender, with active bowel sounds, and no hepatosplenomegaly. No masses. SKIN: No rashes, ulcers or lesions. EXTREMITIES: Ankle edema.  No skin discoloration or tenderness. No palpable cords. LYMPHNODES: No palpable cervical, supraclavicular, axillary or inguinal adenopathy  NEUROLOGICAL: Tremor. PSYCH: Appropriate.   Appointment on 09/16/2016  Component Date Value Ref Range Status  . WBC 09/16/2016 9.2  3.6 - 11.0 K/uL Final  . RBC 09/16/2016 4.11  3.80 - 5.20 MIL/uL Final  . Hemoglobin 09/16/2016 13.0  12.0 - 16.0 g/dL Final  . HCT 09/16/2016 38.9  35.0 - 47.0 % Final  . MCV 09/16/2016 94.6  80.0 - 100.0 fL Final  . MCH 09/16/2016 31.6  26.0 - 34.0 pg Final  . MCHC 09/16/2016 33.4  32.0 - 36.0 g/dL Final  .  RDW 09/16/2016 15.4* 11.5 - 14.5 % Final  . Platelets 09/16/2016 299  150 - 440 K/uL Final  . Neutrophils Relative % 09/16/2016 48  % Final  . Neutro Abs 09/16/2016 4.3  1.4 - 6.5 K/uL Final  . Lymphocytes Relative 09/16/2016 41  % Final  . Lymphs Abs 09/16/2016 3.8* 1.0 - 3.6 K/uL Final  . Monocytes Relative 09/16/2016 8  % Final  . Monocytes Absolute 09/16/2016 0.8  0.2 - 0.9 K/uL Final  . Eosinophils Relative 09/16/2016 2  % Final  . Eosinophils Absolute 09/16/2016 0.2  0 - 0.7 K/uL Final  . Basophils Relative 09/16/2016 1  % Final  . Basophils Absolute 09/16/2016 0.1  0 - 0.1 K/uL Final  . Sodium 09/16/2016 138  135 - 145 mmol/L Final  . Potassium 09/16/2016 4.5  3.5 - 5.1 mmol/L Final  . Chloride 09/16/2016 106  101 - 111 mmol/L Final  . CO2 09/16/2016 22  22 - 32 mmol/L Final  . Glucose, Bld 09/16/2016 122* 65 - 99 mg/dL Final  . BUN 09/16/2016 22* 6 - 20 mg/dL Final  .  Creatinine, Ser 09/16/2016 0.65  0.44 - 1.00 mg/dL Final  . Calcium 09/16/2016 9.2  8.9 - 10.3 mg/dL Final  . Total Protein 09/16/2016 7.4  6.5 - 8.1 g/dL Final  . Albumin 09/16/2016 3.8  3.5 - 5.0 g/dL Final  . AST 09/16/2016 19  15 - 41 U/L Final  . ALT 09/16/2016 6* 14 - 54 U/L Final  . Alkaline Phosphatase 09/16/2016 50  38 - 126 U/L Final  . Total Bilirubin 09/16/2016 0.5  0.3 - 1.2 mg/dL Final  . GFR calc non Af Amer 09/16/2016 >60  >60 mL/min Final  . GFR calc Af Amer 09/16/2016 >60  >60 mL/min Final   Comment: (NOTE) The eGFR has been calculated using the CKD EPI equation. This calculation has not been validated in all clinical situations. eGFR's persistently <60 mL/min signify possible Chronic Kidney Disease.   . Anion gap 09/16/2016 10  5 - 15 Final    Assessment:  NOLLIE SHIFLETT is a 83 y.o. female with stage I left breast cancer s/p partial mastectomy and sentinel lymph node biopsy on 12/08/2015.  Pathology revealed a 0.7 cm grade I  invasive mammary carcinoma of no special type.  There was no lymphvascular invasion.  Margins were clear.  One sentinel lymph node was negative.  Tumor was ER positive (> 90%), PR negative, and HER-2/neu 2+ (negative by FISH).  Pathologic stage was T1bN0.  CA 27.29 was 27.6 on 12/08/2015.  Bilateral diagnostic mammogram on 11/06/2015 revealed a suspicious 5 mm mass in the upper outer left breast.   Radiation therapy was deferred secondary to her age, Parkinson's disease, and small well differentiated ER/PR+ tumor.  She began tamoxifen on 01/12/2016.  Tamoxifen was discontinued on 06/18/2016 after a DVT and pulmonary embolism.  CA27.29 has been followed: 27.6 on 12/04/2015, 24.4 on 05/17/2016, and 32.3 on 09/16/2016.  She has osteoporosis and was previously on Zometa for 5 years.  Bone density study on 12/05/2015 revealed osteoporosis with a T-score of -2.9 in AP spine L1-L4 and -2.5 in the right femoral neck.  She began Prolia on 12/19/2015 (last  injection on 06/17/2016).  She has Parkinson's disease.  Gait is unstable.  She has a history of recurrent UTIs, vaginal atrophy, and incontinence.  She is using estrogen cream sparingly (once a week).  She has a history of iron deficiency.  She notes 2 colonoscopies  in the past. She has been on B12 shots for years.   She was diagnosed with a DVT and pulmonary embolism on 06/05/2016.  Right lower extremity duplex revealed a DVT in the right lower calf veins.  CT angiogram revealed an acute embolism of the right lower lobar artery extending into the anterior, lateral and posterior basal segmental arteries within the right upper lobe anterior segmental artery.  There was no CT evidence of acute right heart strain.  She is on Xarelto.  Symptomatically, patient reports that she is feeling "pretty good".  She has increased fatigued. She reports that her heart will "fluctuate" when she ambulates (newly diagnosed atrial fibrillation).   Plan: 1.  Labs today:  CBC with diff, CMP, CA27.29. 2.  Discuss discontinuation of tamoxifen by Dr. Ubaldo Glassing due to DVT/PE. 3.  Discussed anticoagulation and need for further testing to determine need for continued therapy.  4.  Prolia injection due on 12/18/2016; will delay Prolia until after Thanksgiving.  5.  Discussed treatment options since tamoxifen was discontinued. Discussed hormonal therapy (aromatase inhibitors) and potential side effects; discussed Femara, Arimidex, Aromasin; information provided on Femara. Will readdress at RTC visit.  6.  Discussed need for calcium and Vitamin D supplementation given the patients history of osteoporosis. Patient has previously discontinued these supplements. Patient to restart Calcium 1239m daily and Vitamin D 800 units daily.  7.  RTC on 12/23/2016 for MD assessment, labs (CBC with diff, CMP, CA27.29), and Prolia.    MLequita Asal MD  09/16/2016, 2:49 PM

## 2016-09-16 ENCOUNTER — Inpatient Hospital Stay (HOSPITAL_BASED_OUTPATIENT_CLINIC_OR_DEPARTMENT_OTHER): Payer: Medicare Other | Admitting: Hematology and Oncology

## 2016-09-16 ENCOUNTER — Encounter: Payer: Self-pay | Admitting: Hematology and Oncology

## 2016-09-16 ENCOUNTER — Inpatient Hospital Stay: Payer: Medicare Other | Attending: Hematology and Oncology

## 2016-09-16 VITALS — BP 141/70 | HR 50 | Temp 97.6°F | Resp 18 | Wt 135.6 lb

## 2016-09-16 DIAGNOSIS — Z79899 Other long term (current) drug therapy: Secondary | ICD-10-CM | POA: Diagnosis not present

## 2016-09-16 DIAGNOSIS — Z86718 Personal history of other venous thrombosis and embolism: Secondary | ICD-10-CM

## 2016-09-16 DIAGNOSIS — Z170 Estrogen receptor positive status [ER+]: Secondary | ICD-10-CM

## 2016-09-16 DIAGNOSIS — Z7901 Long term (current) use of anticoagulants: Secondary | ICD-10-CM | POA: Diagnosis not present

## 2016-09-16 DIAGNOSIS — I4891 Unspecified atrial fibrillation: Secondary | ICD-10-CM

## 2016-09-16 DIAGNOSIS — M810 Age-related osteoporosis without current pathological fracture: Secondary | ICD-10-CM | POA: Insufficient documentation

## 2016-09-16 DIAGNOSIS — Z800 Family history of malignant neoplasm of digestive organs: Secondary | ICD-10-CM | POA: Diagnosis not present

## 2016-09-16 DIAGNOSIS — Z86711 Personal history of pulmonary embolism: Secondary | ICD-10-CM

## 2016-09-16 DIAGNOSIS — R5383 Other fatigue: Secondary | ICD-10-CM | POA: Diagnosis not present

## 2016-09-16 DIAGNOSIS — I10 Essential (primary) hypertension: Secondary | ICD-10-CM | POA: Diagnosis not present

## 2016-09-16 DIAGNOSIS — C50412 Malignant neoplasm of upper-outer quadrant of left female breast: Secondary | ICD-10-CM

## 2016-09-16 DIAGNOSIS — R32 Unspecified urinary incontinence: Secondary | ICD-10-CM | POA: Diagnosis not present

## 2016-09-16 DIAGNOSIS — Z87440 Personal history of urinary (tract) infections: Secondary | ICD-10-CM | POA: Insufficient documentation

## 2016-09-16 DIAGNOSIS — Z9223 Personal history of estrogen therapy: Secondary | ICD-10-CM

## 2016-09-16 DIAGNOSIS — E538 Deficiency of other specified B group vitamins: Secondary | ICD-10-CM | POA: Diagnosis not present

## 2016-09-16 DIAGNOSIS — Z853 Personal history of malignant neoplasm of breast: Secondary | ICD-10-CM

## 2016-09-16 DIAGNOSIS — D509 Iron deficiency anemia, unspecified: Secondary | ICD-10-CM | POA: Insufficient documentation

## 2016-09-16 DIAGNOSIS — G20 Parkinson's disease: Secondary | ICD-10-CM | POA: Diagnosis not present

## 2016-09-16 DIAGNOSIS — I2699 Other pulmonary embolism without acute cor pulmonale: Secondary | ICD-10-CM

## 2016-09-16 DIAGNOSIS — M1990 Unspecified osteoarthritis, unspecified site: Secondary | ICD-10-CM

## 2016-09-16 DIAGNOSIS — I824Z1 Acute embolism and thrombosis of unspecified deep veins of right distal lower extremity: Secondary | ICD-10-CM

## 2016-09-16 LAB — CBC WITH DIFFERENTIAL/PLATELET
Basophils Absolute: 0.1 10*3/uL (ref 0–0.1)
Basophils Relative: 1 %
Eosinophils Absolute: 0.2 10*3/uL (ref 0–0.7)
Eosinophils Relative: 2 %
HCT: 38.9 % (ref 35.0–47.0)
Hemoglobin: 13 g/dL (ref 12.0–16.0)
Lymphocytes Relative: 41 %
Lymphs Abs: 3.8 10*3/uL — ABNORMAL HIGH (ref 1.0–3.6)
MCH: 31.6 pg (ref 26.0–34.0)
MCHC: 33.4 g/dL (ref 32.0–36.0)
MCV: 94.6 fL (ref 80.0–100.0)
Monocytes Absolute: 0.8 10*3/uL (ref 0.2–0.9)
Monocytes Relative: 8 %
Neutro Abs: 4.3 10*3/uL (ref 1.4–6.5)
Neutrophils Relative %: 48 %
Platelets: 299 10*3/uL (ref 150–440)
RBC: 4.11 MIL/uL (ref 3.80–5.20)
RDW: 15.4 % — ABNORMAL HIGH (ref 11.5–14.5)
WBC: 9.2 10*3/uL (ref 3.6–11.0)

## 2016-09-16 LAB — COMPREHENSIVE METABOLIC PANEL
ALT: 6 U/L — ABNORMAL LOW (ref 14–54)
AST: 19 U/L (ref 15–41)
Albumin: 3.8 g/dL (ref 3.5–5.0)
Alkaline Phosphatase: 50 U/L (ref 38–126)
Anion gap: 10 (ref 5–15)
BUN: 22 mg/dL — ABNORMAL HIGH (ref 6–20)
CO2: 22 mmol/L (ref 22–32)
Calcium: 9.2 mg/dL (ref 8.9–10.3)
Chloride: 106 mmol/L (ref 101–111)
Creatinine, Ser: 0.65 mg/dL (ref 0.44–1.00)
GFR calc Af Amer: >60 mL/min (ref >60)
GFR calc non Af Amer: >60 mL/min (ref >60)
Glucose, Bld: 122 mg/dL — ABNORMAL HIGH (ref 65–99)
Potassium: 4.5 mmol/L (ref 3.5–5.1)
Sodium: 138 mmol/L (ref 135–145)
Total Bilirubin: 0.5 mg/dL (ref 0.3–1.2)
Total Protein: 7.4 g/dL (ref 6.5–8.1)

## 2016-09-16 NOTE — Progress Notes (Signed)
Patient is here for follow up, she does not have much of an appetite but she is eating.

## 2016-09-16 NOTE — Patient Instructions (Signed)
Letrozole tablets What is this medicine? LETROZOLE (LET roe zole) blocks the production of estrogen. It is used to treat breast cancer. This medicine may be used for other purposes; ask your health care provider or pharmacist if you have questions. COMMON BRAND NAME(S): Femara What should I tell my health care provider before I take this medicine? They need to know if you have any of these conditions: -high cholesterol -liver disease -osteoporosis (weak bones) -an unusual or allergic reaction to letrozole, other medicines, foods, dyes, or preservatives -pregnant or trying to get pregnant -breast-feeding How should I use this medicine? Take this medicine by mouth with a glass of water. You may take it with or without food. Follow the directions on the prescription label. Take your medicine at regular intervals. Do not take your medicine more often than directed. Do not stop taking except on your doctor's advice. Talk to your pediatrician regarding the use of this medicine in children. Special care may be needed. Overdosage: If you think you have taken too much of this medicine contact a poison control center or emergency room at once. NOTE: This medicine is only for you. Do not share this medicine with others. What if I miss a dose? If you miss a dose, take it as soon as you can. If it is almost time for your next dose, take only that dose. Do not take double or extra doses. What may interact with this medicine? Do not take this medicine with any of the following medications: -estrogens, like hormone replacement therapy or birth control pills This medicine may also interact with the following medications: -dietary supplements such as androstenedione or DHEA -prasterone -tamoxifen This list may not describe all possible interactions. Give your health care provider a list of all the medicines, herbs, non-prescription drugs, or dietary supplements you use. Also tell them if you smoke, drink  alcohol, or use illegal drugs. Some items may interact with your medicine. What should I watch for while using this medicine? Tell your doctor or healthcare professional if your symptoms do not start to get better or if they get worse. Do not become pregnant while taking this medicine or for 3 weeks after stopping it. Women should inform their doctor if they wish to become pregnant or think they might be pregnant. There is a potential for serious side effects to an unborn child. Talk to your health care professional or pharmacist for more information. Do not breast-feed while taking this medicine or for 3 weeks after stopping it. This medicine may interfere with the ability to have a child. Talk with your doctor or health care professional if you are concerned about your fertility. Using this medicine for a long time may increase your risk of low bone mass. Talk to your doctor about bone health. You may get drowsy or dizzy. Do not drive, use machinery, or do anything that needs mental alertness until you know how this medicine affects you. Do not stand or sit up quickly, especially if you are an older patient. This reduces the risk of dizzy or fainting spells. You may need blood work done while you are taking this medicine. What side effects may I notice from receiving this medicine? Side effects that you should report to your doctor or health care professional as soon as possible: -allergic reactions like skin rash, itching, or hives -bone fracture -chest pain -signs and symptoms of a blood clot such as breathing problems; changes in vision; chest pain; severe, sudden headache; pain, swelling,   warmth in the leg; trouble speaking; sudden numbness or weakness of the face, arm or leg -vaginal bleeding Side effects that usually do not require medical attention (report to your doctor or health care professional if they continue or are bothersome): -bone, back, joint, or muscle  pain -dizziness -fatigue -fluid retention -headache -hot flashes, night sweats -nausea -weight gain This list may not describe all possible side effects. Call your doctor for medical advice about side effects. You may report side effects to FDA at 1-800-FDA-1088. Where should I keep my medicine? Keep out of the reach of children. Store between 15 and 30 degrees C (59 and 86 degrees F). Throw away any unused medicine after the expiration date. NOTE: This sheet is a summary. It may not cover all possible information. If you have questions about this medicine, talk to your doctor, pharmacist, or health care provider.  2018 Elsevier/Gold Standard (2015-08-21 11:10:41)  

## 2016-09-17 LAB — CA 27.29 (SERIAL MONITOR): CA 27.29: 32.3 U/mL (ref 0.0–38.6)

## 2016-09-18 DIAGNOSIS — I824Z1 Acute embolism and thrombosis of unspecified deep veins of right distal lower extremity: Secondary | ICD-10-CM | POA: Insufficient documentation

## 2016-09-29 ENCOUNTER — Inpatient Hospital Stay
Admission: EM | Admit: 2016-09-29 | Discharge: 2016-10-04 | DRG: 242 | Disposition: A | Discharge status: Skilled Nursing Facility | Payer: Medicare Other | Attending: Specialist | Admitting: Specialist

## 2016-09-29 ENCOUNTER — Other Ambulatory Visit: Payer: Self-pay

## 2016-09-29 ENCOUNTER — Encounter: Payer: Self-pay | Admitting: Emergency Medicine

## 2016-09-29 ENCOUNTER — Emergency Department: Payer: Medicare Other

## 2016-09-29 DIAGNOSIS — M810 Age-related osteoporosis without current pathological fracture: Secondary | ICD-10-CM | POA: Diagnosis present

## 2016-09-29 DIAGNOSIS — Z7901 Long term (current) use of anticoagulants: Secondary | ICD-10-CM

## 2016-09-29 DIAGNOSIS — Z79899 Other long term (current) drug therapy: Secondary | ICD-10-CM | POA: Diagnosis not present

## 2016-09-29 DIAGNOSIS — Z86718 Personal history of other venous thrombosis and embolism: Secondary | ICD-10-CM

## 2016-09-29 DIAGNOSIS — N390 Urinary tract infection, site not specified: Secondary | ICD-10-CM

## 2016-09-29 DIAGNOSIS — G2581 Restless legs syndrome: Secondary | ICD-10-CM | POA: Diagnosis present

## 2016-09-29 DIAGNOSIS — Z90710 Acquired absence of both cervix and uterus: Secondary | ICD-10-CM | POA: Diagnosis not present

## 2016-09-29 DIAGNOSIS — Z853 Personal history of malignant neoplasm of breast: Secondary | ICD-10-CM

## 2016-09-29 DIAGNOSIS — E538 Deficiency of other specified B group vitamins: Secondary | ICD-10-CM | POA: Diagnosis present

## 2016-09-29 DIAGNOSIS — Z950 Presence of cardiac pacemaker: Secondary | ICD-10-CM

## 2016-09-29 DIAGNOSIS — R001 Bradycardia, unspecified: Secondary | ICD-10-CM | POA: Diagnosis present

## 2016-09-29 DIAGNOSIS — I5033 Acute on chronic diastolic (congestive) heart failure: Secondary | ICD-10-CM | POA: Diagnosis present

## 2016-09-29 DIAGNOSIS — M1990 Unspecified osteoarthritis, unspecified site: Secondary | ICD-10-CM | POA: Diagnosis present

## 2016-09-29 DIAGNOSIS — B9620 Unspecified Escherichia coli [E. coli] as the cause of diseases classified elsewhere: Secondary | ICD-10-CM | POA: Diagnosis present

## 2016-09-29 DIAGNOSIS — I482 Chronic atrial fibrillation: Secondary | ICD-10-CM | POA: Diagnosis present

## 2016-09-29 DIAGNOSIS — Z800 Family history of malignant neoplasm of digestive organs: Secondary | ICD-10-CM

## 2016-09-29 DIAGNOSIS — Z86711 Personal history of pulmonary embolism: Secondary | ICD-10-CM

## 2016-09-29 DIAGNOSIS — G20 Parkinson's disease: Secondary | ICD-10-CM | POA: Diagnosis present

## 2016-09-29 DIAGNOSIS — R002 Palpitations: Secondary | ICD-10-CM | POA: Diagnosis present

## 2016-09-29 DIAGNOSIS — I495 Sick sinus syndrome: Secondary | ICD-10-CM | POA: Diagnosis present

## 2016-09-29 DIAGNOSIS — R410 Disorientation, unspecified: Secondary | ICD-10-CM

## 2016-09-29 DIAGNOSIS — R531 Weakness: Secondary | ICD-10-CM

## 2016-09-29 DIAGNOSIS — N3000 Acute cystitis without hematuria: Secondary | ICD-10-CM | POA: Diagnosis present

## 2016-09-29 DIAGNOSIS — G9341 Metabolic encephalopathy: Secondary | ICD-10-CM | POA: Diagnosis present

## 2016-09-29 DIAGNOSIS — I110 Hypertensive heart disease with heart failure: Secondary | ICD-10-CM | POA: Diagnosis present

## 2016-09-29 DIAGNOSIS — R4182 Altered mental status, unspecified: Secondary | ICD-10-CM | POA: Diagnosis present

## 2016-09-29 DIAGNOSIS — I951 Orthostatic hypotension: Secondary | ICD-10-CM | POA: Diagnosis present

## 2016-09-29 HISTORY — DX: Other pulmonary embolism without acute cor pulmonale: I26.99

## 2016-09-29 HISTORY — DX: Unspecified atrial fibrillation: I48.91

## 2016-09-29 HISTORY — DX: Deep phlebothrombosis in pregnancy, unspecified trimester: O22.30

## 2016-09-29 LAB — URINALYSIS, COMPLETE (UACMP) WITH MICROSCOPIC
BILIRUBIN URINE: NEGATIVE
GLUCOSE, UA: NEGATIVE mg/dL
HGB URINE DIPSTICK: NEGATIVE
KETONES UR: 5 mg/dL — AB
NITRITE: POSITIVE — AB
PH: 5 (ref 5.0–8.0)
PROTEIN: 100 mg/dL — AB
SPECIFIC GRAVITY, URINE: 1.024 (ref 1.005–1.030)

## 2016-09-29 LAB — DIGOXIN LEVEL: DIGOXIN LVL: 1.5 ng/mL (ref 0.8–2.0)

## 2016-09-29 LAB — CBC
HCT: 36.7 % (ref 35.0–47.0)
Hemoglobin: 12.3 g/dL (ref 12.0–16.0)
MCH: 31.9 pg (ref 26.0–34.0)
MCHC: 33.6 g/dL (ref 32.0–36.0)
MCV: 94.8 fL (ref 80.0–100.0)
PLATELETS: 244 10*3/uL (ref 150–440)
RBC: 3.87 MIL/uL (ref 3.80–5.20)
RDW: 14.9 % — AB (ref 11.5–14.5)
WBC: 10.3 10*3/uL (ref 3.6–11.0)

## 2016-09-29 LAB — BASIC METABOLIC PANEL
Anion gap: 9 (ref 5–15)
BUN: 24 mg/dL — AB (ref 6–20)
CHLORIDE: 104 mmol/L (ref 101–111)
CO2: 23 mmol/L (ref 22–32)
CREATININE: 0.85 mg/dL (ref 0.44–1.00)
Calcium: 8.6 mg/dL — ABNORMAL LOW (ref 8.9–10.3)
GFR, EST NON AFRICAN AMERICAN: 59 mL/min — AB (ref >60)
Glucose, Bld: 107 mg/dL — ABNORMAL HIGH (ref 65–99)
POTASSIUM: 4.1 mmol/L (ref 3.5–5.1)
SODIUM: 136 mmol/L (ref 135–145)

## 2016-09-29 LAB — TROPONIN I: TROPONIN I: 0.03 ng/mL — AB (ref <0.03)

## 2016-09-29 MED ORDER — DEXTROSE 5 % IV SOLN
1.0000 g | Freq: Once | INTRAVENOUS | Status: AC
  Administered 2016-09-29: 1 g via INTRAVENOUS
  Filled 2016-09-29: qty 10

## 2016-09-29 MED ORDER — ESTROGENS, CONJUGATED 0.625 MG/GM VA CREA
1.0000 | TOPICAL_CREAM | Freq: Every day | VAGINAL | Status: DC
  Administered 2016-09-30: 1 via VAGINAL
  Filled 2016-09-29: qty 30

## 2016-09-29 MED ORDER — RIVAROXABAN 20 MG PO TABS
20.0000 mg | ORAL_TABLET | Freq: Every day | ORAL | Status: DC
  Filled 2016-09-29: qty 1

## 2016-09-29 MED ORDER — POLYVINYL ALCOHOL 1.4 % OP SOLN
1.0000 [drp] | Freq: Three times a day (TID) | OPHTHALMIC | Status: DC | PRN
  Filled 2016-09-29: qty 15

## 2016-09-29 MED ORDER — ONDANSETRON HCL 4 MG/2ML IJ SOLN
4.0000 mg | Freq: Four times a day (QID) | INTRAMUSCULAR | Status: DC | PRN
  Administered 2016-10-03: 4 mg via INTRAVENOUS
  Filled 2016-09-29: qty 2

## 2016-09-29 MED ORDER — FESOTERODINE FUMARATE ER 4 MG PO TB24
4.0000 mg | ORAL_TABLET | Freq: Every day | ORAL | Status: DC
  Administered 2016-09-30 – 2016-10-04 (×5): 4 mg via ORAL
  Filled 2016-09-29 (×5): qty 1

## 2016-09-29 MED ORDER — CARBIDOPA-LEVODOPA ER 50-200 MG PO TBCR
3.0000 | EXTENDED_RELEASE_TABLET | Freq: Three times a day (TID) | ORAL | Status: DC
  Administered 2016-09-29 (×2): 3 via ORAL
  Filled 2016-09-29 (×3): qty 3

## 2016-09-29 MED ORDER — ACETAMINOPHEN 500 MG PO TABS: 500.0000 mg | ORAL_TABLET | Freq: Four times a day (QID) | ORAL | Status: DC | PRN

## 2016-09-29 MED ORDER — ACETAMINOPHEN 650 MG RE SUPP: 650.0000 mg | Freq: Four times a day (QID) | RECTAL | Status: DC | PRN

## 2016-09-29 MED ORDER — ONDANSETRON HCL 4 MG PO TABS: 4.0000 mg | ORAL_TABLET | Freq: Four times a day (QID) | ORAL | Status: DC | PRN

## 2016-09-29 MED ORDER — ACETAMINOPHEN 325 MG PO TABS
650.0000 mg | ORAL_TABLET | Freq: Four times a day (QID) | ORAL | Status: DC | PRN
  Administered 2016-09-29: 650 mg via ORAL
  Filled 2016-09-29: qty 2

## 2016-09-29 MED ORDER — DEXTROSE 5 % IV SOLN
1.0000 g | INTRAVENOUS | Status: DC
  Administered 2016-09-30 – 2016-10-01 (×2): 1 g via INTRAVENOUS
  Filled 2016-09-29 (×3): qty 10

## 2016-09-29 MED ORDER — SODIUM CHLORIDE 0.9 % IV SOLN
INTRAVENOUS | Status: AC
  Administered 2016-09-29: 17:00:00 via INTRAVENOUS

## 2016-09-29 MED ORDER — POLYETHYLENE GLYCOL 3350 17 G PO PACK: 17.0000 g | PACK | Freq: Every day | ORAL | Status: DC | PRN

## 2016-09-29 MED ORDER — MIDODRINE HCL 5 MG PO TABS
5.0000 mg | ORAL_TABLET | Freq: Three times a day (TID) | ORAL | Status: DC
  Administered 2016-09-29 – 2016-10-04 (×14): 5 mg via ORAL
  Filled 2016-09-29 (×15): qty 1

## 2016-09-29 MED ORDER — ATROPINE SULFATE 1 MG/10ML IJ SOSY: 0.5000 mg | PREFILLED_SYRINGE | INTRAMUSCULAR | Status: DC | PRN

## 2016-09-29 NOTE — H&P (Signed)
[facility] [place] NAME: Laura Cisneros    MR#:  [id number]  DATE OF BIRTH:  1925/05/14  DATE OF ADMISSION:  09/29/2016  PRIMARY CARE PHYSICIAN: Leonel Ramsay, MD   REQUESTING/REFERRING PHYSICIAN: Nance Pear, MD  CHIEF COMPLAINT:  AMS  HISTORY OF PRESENT ILLNESS:  Laura Cisneros  is a 83 y.o. female with a known history of chronic atrial fibrillation on Xarelto, Parkinson's disease, history of DVT, pulmonary embolism is brought into the ED by her son for altered mental status. Patient seems to be more confused lately also she is being weak.during my examination patient answer a few questions denies any dizziness or chest pain. Son at bedside. Heart rate dropped down to 33-34. ED physician has discussed with on-call cardiologist who has recommended to hold her digoxin at this time  PAST MEDICAL HISTORY:   Past Medical History:  Diagnosis Date  . A-fib ([place])   . Anemia   . Arthritis    osteoarthritis  . Atrophic vaginitis   . Cancer ([place])    Breast  . Cataract   . Diverticulosis   . DVT (deep vein thrombosis) in pregnancy ([place])   . Hiatal hernia   . Hypertension   . Incontinence   . Osteoporosis   . Parkinson disease ([place])   . PE (pulmonary thromboembolism) ([place])   . Vitamin B12 deficiency     PAST SURGICAL HISTOIRY:   Past Surgical History:  Procedure Laterality Date  . ABDOMINAL HYSTERECTOMY    . ABDOMINAL HYSTERECTOMY    . BREAST BIOPSY Left 10/2012   benign  . BREAST BIOPSY Left 11/22/2015   invasive mammary ca   . BREAST EXCISIONAL BIOPSY Left 12/08/2015   lumpectomy  . EYE SURGERY Bilateral    Catarct Extraction with IOL  . FOOT FRACTURE SURGERY Right    pinning, Dr. Francia Greaves, [facility]  . INCONTINENCE SURGERY    . PARTIAL MASTECTOMY WITH AXILLARY SENTINEL LYMPH NODE BIOPSY Left 12/08/2015   Procedure: PARTIAL MASTECTOMY WITH AXILLARY SENTINEL LYMPH NODE BIOPSY;  Surgeon: [name], MD;  Location:  [facility] ORS;  Service: General;  Laterality: Left;    SOCIAL HISTORY:   Social History  Substance Use Topics  . Smoking status: Never Smoker  . Smokeless tobacco: Never Used  . Alcohol use No    FAMILY HISTORY:   Family History  Problem Relation Age of Onset  . Esophageal cancer Sister   . Kidney disease Neg Hx   . Bladder Cancer Neg Hx   . Breast cancer Neg Hx     DRUG ALLERGIES:  No Known Allergies  REVIEW OF SYSTEMS:  Review of systems Limited patient with intermittent episodes of confusion  CONSTITUTIONAL: reporting weakness.  RESPIRATORY: No cough, shortness of breath, wheezing or hemoptysis.  CARDIOVASCULAR: No chest pain. Denies any dizziness GASTROINTESTINAL: No nausea, vomiting, diarrhea or abdominal pain.    MEDICATIONS AT HOME:   Prior to Admission medications   Medication Sig Start Date End Date Taking? Authorizing Provider  acetaminophen (TYLENOL) 500 MG tablet Take 500 mg by mouth every 6 (six) hours as needed (pain).   Yes [provider]  carbidopa-levodopa (SINEMET CR) 50-200 MG tablet Take 3 tablets by mouth 3 (three) times daily.  10/24/15 10/23/16 Yes [provider]  conjugated estrogens (PREMARIN) vaginal cream Place 1 Applicatorful vaginally daily. Apply 0.5mg  (pea-sized amount)  just inside the vaginal introitus with a finger-tip every night for two weeks and then Monday, Wednesday  and Friday nights. Patient taking differently: Place 1 Applicatorful vaginally once a week. On Wednesday 10/12/15  Yes McGowan, Larene Beach A, PA-C  cyanocobalamin (,VITAMIN B-12,) 1000 MCG/ML injection INJECT 1ML INTO MUSCLE ONCE A MONTH AS DIRECTED 01/04/15  Yes [provider]  digoxin (LANOXIN) 0.125 MG tablet Take 1 tablet (0.125 mg total) by mouth daily. 06/21/16  Yes Dustin Flock, MD  diltiazem (CARDIZEM CD) 120 MG 24 hr capsule Take 1 capsule (120 mg total) by mouth daily. 06/21/16  Yes Dustin Flock, MD  fesoterodine (TOVIAZ) 4 MG TB24 tablet  Take 1 tablet (4 mg total) by mouth daily. 02/27/16  Yes McGowan, Larene Beach A, PA-C  hydroxypropyl methylcellulose / hypromellose (ISOPTO TEARS / GONIOVISC) 2.5 % ophthalmic solution Place 1 drop into both eyes 3 (three) times daily as needed for dry eyes.   Yes [provider]  midodrine (PROAMATINE) 2.5 MG tablet Take 2 tablets (5 mg total) by mouth 3 (three) times daily with meals. 06/21/16  Yes Dustin Flock, MD  XARELTO 20 MG TABS tablet Take 20 mg by mouth daily.  06/05/16 09/29/17 Yes [provider]      VITAL SIGNS:  Blood pressure 117/68, pulse 71, temperature 98.7 F (37.1 C), temperature source Oral, resp. rate 16, height 5' (1.524 m), weight 61.2 kg (135 lb), SpO2 95 %.  PHYSICAL EXAMINATION:  GENERAL:  83 y.o.-year-old patient lying in the bed with no acute distress.  EYES: Pupils equal, round, reactive to light and accommodation. No scleral icterus.  HEENT: Head atraumatic, normocephalic. Oropharynx and nasopharynx clear.  NECK:  Supple, no jugular venous distention. No thyroid enlargement, no tenderness.  LUNGS: Normal breath sounds bilaterally, no wheezing, rales,rhonchi or crepitation. No use of accessory muscles of respiration.  CARDIOVASCULAR: bradycardic, No murmurs, rubs, or gallops.  ABDOMEN: Soft, nontender, nondistended. Bowel sounds present. No organomegaly or mass.  EXTREMITIES: No pedal edema, cyanosis, or clubbing.  NEUROLOGIC: patient is intermittently confused. Oriented to person PSYCHIATRIC: The patient is alert and oriented x person.  SKIN: No obvious rash, lesion, or ulcer.   LABORATORY PANEL:   CBC  Recent Labs Lab 09/29/16 1026  WBC 10.3  HGB 12.3  HCT 36.7  PLT 244   ------------------------------------------------------------------------------------------------------------------  Chemistries   Recent Labs Lab 09/29/16 1026  NA 136  K 4.1  CL 104  CO2 23  GLUCOSE 107*  BUN 24*  CREATININE 0.85  CALCIUM 8.6*    ------------------------------------------------------------------------------------------------------------------  Cardiac Enzymes  Recent Labs Lab 09/29/16 1026  TROPONINI 0.03*   ------------------------------------------------------------------------------------------------------------------  RADIOLOGY:  Dg Chest Port 1 View  Result Date: 09/29/2016 CLINICAL DATA:  Non smoker, low heart rate in ED, c/o confusion x 2 days and palpitations this morning. Son also states patient has become weaker over this week. Had been walking with a walker, but all week, strength has declined EXAM: PORTABLE CHEST - 1 VIEW COMPARISON:  07/04/2016 FINDINGS: Mild central pulmonary vascular congestion and perihilar interstitial prominence as before. Moderate hiatal hernia. No definite airspace disease. Mild cardiomegaly.  Atheromatous aorta. No definite effusion.  No pneumothorax. Visualized bones unremarkable. IMPRESSION: 1. Stable mild cardiomegaly and pulmonary vascular congestion. 2. No definite acute disease. 3. Hiatal hernia 4. Aortic Atherosclerosis (ICD10-170.0) Electronically Signed   By: Lucrezia Europe M.D.   On: 09/29/2016 10:53    EKG:   Orders placed or performed during the hospital encounter of 09/29/16  . ED EKG within 10 minutes  . ED EKG within 10 minutes    IMPRESSION AND PLAN:  Laura Cisneros  is a 83 y.o. female with a known history of chronic atrial fibrillation on Xarelto, Parkinson's disease, history of DVT, pulmonary embolism is brought into the ED by her son for altered mental status. Patient seems to be more confused lately also she is being weak.during my examination patient answer a few questions denies any dizziness or chest pain. Son at bedside. Heart rate dropped down to 33-34.  # altered mental status secondary to acute cystitis Telemetry Urine culture and sensitivity IV Rocephin and IV fluids Neuro checks   #Junctional bradycardia-could be sick sinus syndrome Monitor  patient on telemetry Discontinue digoxin and Cardizem Cardiologist Dr. Clayborn Bigness is notified by the ED physician Dr. Arma Heading bedside atropine as needed for symptomatic bradycardia with heart rate less than 30  check TSH   #chronic history of atrial fibrillation Currently bradycardic Hold digoxin and Cardizem Continue anticoagulant Xarelto on daily basis Cardiac consult placed  #chronic history of Parkinson's disease  continue Sinemet   DVT px- xarelto   All the records are reviewed and case discussed with ED provider. Management plans discussed with the patient, family and they are in agreement.  CODE STATUS: FC, son is HCPOA  TOTAL TIME TAKING CARE OF THIS PATIENT: 45 minutes.   Note: This dictation was prepared with Dragon dictation along with smaller phrase technology. Any transcriptional errors that result from this process are unintentional.  Nicholes Mango M.D on 09/29/2016 at 1:34 PM  Between 7am to 6pm - Pager - [phone]  After 6pm go to [email or web address] - password [email or web address] [facility]  [facility]  Office  [phone]  CC: Primary care physician; Leonel Ramsay, MD

## 2016-09-29 NOTE — ED Triage Notes (Signed)
Arrives with c/o confusion x 2 days and palpitations this morning.  Son also states patient has become weaker over this week.  Had been walking with a walker, but all week, strength has declined.

## 2016-09-29 NOTE — Progress Notes (Signed)
LCSW met with family to collect information for possible future needs and level of care.Patient came to ED today on family insistance as she has become very weak this last week. Patient has stage I breast cancer and Parkinson, she usually uses her walker but she reports she feels very weak this past week. Patient is oriented x4. Verbal consent given to speak to her family. She lives in a single family home with 1 of her 6 sons. Other family members live in very close proximity of her home and she is never alone 24-7 and is cared for by her family. She has home health care/[facility] comes in with a nurse 80 hours per month. It is the family's plan patient is to return home not a SNF as per her son Aaima Gaddie who is her HCPOA LCSW informed ED RN- of brief visit.   No further needs  [employer] LCSW [phone]

## 2016-09-29 NOTE — ED Provider Notes (Addendum)
[facility] Emergency Department Provider Note   ____________________________________________   I have reviewed the triage vital signs and the nursing notes.   HISTORY  Chief Complaint Altered Mental Status and Weakness   History limited by: Not Limited   HPI Laura Cisneros is a 83 y.o. female who presents to the emergency department today brought by family because of concerns for confusion. Family states this is been going on for a couple of days. It has been fairly constant and is slightly getting worse. They've also noticed that the patient has been weaker. The patient denies any pain. She denies any recent fevers. Family does state that the last time the patient acted like this she had a urinary tract infection.   Past Medical History:  Diagnosis Date  . A-fib ([place])   . Anemia   . Arthritis    osteoarthritis  . Atrophic vaginitis   . Cancer ([place])    Breast  . Cataract   . Diverticulosis   . DVT (deep vein thrombosis) in pregnancy ([place])   . Hiatal hernia   . Hypertension   . Incontinence   . Osteoporosis   . Parkinson disease ([place])   . PE (pulmonary thromboembolism) ([place])   . Vitamin B12 deficiency     Patient Active Problem List   Diagnosis Date Noted  . Deep vein thrombosis (DVT) of distal vein of right lower extremity ([place]) 09/18/2016  . UTI (urinary tract infection) 06/18/2016  . Parkinson disease ([place]) 06/18/2016  . HTN (hypertension) 06/18/2016  . AF (paroxysmal atrial fibrillation) ([place]) 06/18/2016  . PE (pulmonary thromboembolism) ([place]) 06/18/2016  . Osteoporosis without current pathological fracture 12/04/2015  . Breast cancer of upper-outer quadrant of left female breast ([place]) 11/22/2015    Past Surgical History:  Procedure Laterality Date  . ABDOMINAL HYSTERECTOMY    . ABDOMINAL HYSTERECTOMY    . BREAST BIOPSY Left 10/2012   benign  . BREAST BIOPSY Left 11/22/2015   invasive mammary ca   . BREAST EXCISIONAL BIOPSY Left  12/08/2015   lumpectomy  . EYE SURGERY Bilateral    Catarct Extraction with IOL  . FOOT FRACTURE SURGERY Right    pinning, Dr. Francia Greaves, [facility]  . INCONTINENCE SURGERY    . PARTIAL MASTECTOMY WITH AXILLARY SENTINEL LYMPH NODE BIOPSY Left 12/08/2015   Procedure: PARTIAL MASTECTOMY WITH AXILLARY SENTINEL LYMPH NODE BIOPSY;  Surgeon: [name], MD;  Location: [facility] ORS;  Service: General;  Laterality: Left;    Prior to Admission medications   Medication Sig Start Date End Date Taking? Authorizing Provider  acetaminophen (TYLENOL) 500 MG tablet Take 500 mg by mouth every 6 (six) hours as needed (pain).   Yes [provider]  carbidopa-levodopa (SINEMET CR) 50-200 MG tablet Take 3 tablets by mouth 3 (three) times daily.  10/24/15 10/23/16 Yes [provider]  conjugated estrogens (PREMARIN) vaginal cream Place 1 Applicatorful vaginally daily. Apply 0.5mg  (pea-sized amount)  just inside the vaginal introitus with a finger-tip every night for two weeks and then Monday, Wednesday and Friday nights. Patient taking differently: Place 1 Applicatorful vaginally once a week. On Wednesday 10/12/15  Yes McGowan, Larene Beach A, PA-C  cyanocobalamin (,VITAMIN B-12,) 1000 MCG/ML injection INJECT 1ML INTO MUSCLE ONCE A MONTH AS DIRECTED 01/04/15  Yes [provider]  digoxin (LANOXIN) 0.125 MG tablet Take 1 tablet (0.125 mg total) by mouth daily. 06/21/16  Yes Dustin Flock, MD  diltiazem (CARDIZEM CD) 120 MG 24 hr capsule Take 1 capsule (120 mg  total) by mouth daily. 06/21/16  Yes Dustin Flock, MD  fesoterodine (TOVIAZ) 4 MG TB24 tablet Take 1 tablet (4 mg total) by mouth daily. 02/27/16  Yes McGowan, Larene Beach A, PA-C  hydroxypropyl methylcellulose / hypromellose (ISOPTO TEARS / GONIOVISC) 2.5 % ophthalmic solution Place 1 drop into both eyes 3 (three) times daily as needed for dry eyes.   Yes [provider]  midodrine (PROAMATINE) 2.5 MG tablet Take 2 tablets (5 mg total) by  mouth 3 (three) times daily with meals. 06/21/16  Yes Dustin Flock, MD  XARELTO 20 MG TABS tablet Take 20 mg by mouth daily.  06/05/16 09/29/17 Yes [provider]    Allergies Patient has no known allergies.  Family History  Problem Relation Age of Onset  . Esophageal cancer Sister   . Kidney disease Neg Hx   . Bladder Cancer Neg Hx   . Breast cancer Neg Hx     Social History Social History  Substance Use Topics  . Smoking status: Never Smoker  . Smokeless tobacco: Never Used  . Alcohol use No    Review of Systems Constitutional: No fever/chills Eyes: No visual changes. ENT: No sore throat. Cardiovascular: Denies chest pain. Respiratory: Denies shortness of breath. Gastrointestinal: No abdominal pain.  No nausea, no vomiting.  No diarrhea.   Genitourinary: Negative for dysuria. Musculoskeletal: Negative for back pain. Skin: Negative for rash. Neurological: Negative for headaches, focal weakness or numbness.  ____________________________________________   PHYSICAL EXAM:  VITAL SIGNS: ED Triage Vitals  Enc Vitals Group     BP 09/29/16 1022 117/68     Pulse Rate 09/29/16 1022 71     Resp 09/29/16 1022 16     Temp 09/29/16 1022 98.7 F (37.1 C)     Temp Source 09/29/16 1022 Oral     SpO2 09/29/16 1022 95 %     Weight 09/29/16 1019 135 lb (61.2 kg)     Height 09/29/16 1019 5' (1.524 m)     Head Circumference --      Peak Flow --      Pain Score 09/29/16 1035 0   Constitutional: Awake and alert. Well appearing and in no distress. Eyes: Conjunctivae are normal.  ENT   Head: Normocephalic and atraumatic.   Nose: No congestion/rhinnorhea.   Mouth/Throat: Mucous membranes are moist.   Neck: No stridor. Hematological/Lymphatic/Immunilogical: No cervical lymphadenopathy. Cardiovascular: Bradycardia, regular rhythm.  No murmurs, rubs, or gallops.  Respiratory: Normal respiratory effort without tachypnea nor retractions. Breath sounds are clear  and equal bilaterally. No wheezes/rales/rhonchi. Gastrointestinal: Soft and non tender. No rebound. No guarding.  Genitourinary: Deferred Musculoskeletal: Normal range of motion in all extremities. No lower extremity edema. Neurologic:  Normal speech and language. No gross focal neurologic deficits are appreciated.  Skin:  Skin is warm, dry and intact. No rash noted. Psychiatric: Mood and affect are normal. Speech and behavior are normal. Patient exhibits appropriate insight and judgment.  ____________________________________________    LABS (pertinent positives/negatives)  Labs Reviewed  BASIC METABOLIC PANEL - Abnormal; Notable for the following:       Result Value   Glucose, Bld 107 (*)    BUN 24 (*)    Calcium 8.6 (*)    GFR calc non Af Amer 59 (*)    All other components within normal limits  CBC - Abnormal; Notable for the following:    RDW 14.9 (*)    All other components within normal limits  TROPONIN I - Abnormal; Notable for the  following:    Troponin I 0.03 (*)    All other components within normal limits  URINALYSIS, COMPLETE (UACMP) WITH MICROSCOPIC - Abnormal; Notable for the following:    Color, Urine AMBER (*)    APPearance HAZY (*)    Ketones, ur 5 (*)    Protein, ur 100 (*)    Nitrite POSITIVE (*)    Leukocytes, UA TRACE (*)    Bacteria, UA MANY (*)    Squamous Epithelial / LPF 6-30 (*)    All other components within normal limits  URINE CULTURE  DIGOXIN LEVEL     ____________________________________________   EKG  I, Nance Pear, attending physician, personally viewed and interpreted this EKG  EKG Time: 1020 Rate: 36 Rhythm: junctional bradycardia Axis: normal Intervals: qtc 429 QRS: narrow ST changes: no st elevation Impression: abnormal ekg   ____________________________________________    RADIOLOGY  CXR IMPRESSION:  1. Stable mild cardiomegaly and pulmonary vascular congestion.  2. No definite acute disease.  3. Hiatal  hernia  4. Aortic Atherosclerosis (ICD10-170.0)    ____________________________________________   PROCEDURES  Procedures  CRITICAL CARE Performed by: Nance Pear   Total critical care time: 30 minutes  Critical care time was exclusive of separately billable procedures and treating other patients.  Critical care was necessary to treat or prevent imminent or life-threatening deterioration.  Critical care was time spent personally by me on the following activities: development of treatment plan with patient and/or surrogate as well as nursing, discussions with consultants, evaluation of patient's response to treatment, examination of patient, obtaining history from patient or surrogate, ordering and performing treatments and interventions, ordering and review of laboratory studies, ordering and review of radiographic studies, pulse oximetry and re-evaluation of patient's condition.  ____________________________________________   INITIAL IMPRESSION / ASSESSMENT AND PLAN / ED COURSE  Pertinent labs & imaging results that were available during my care of the patient were reviewed by me and considered in my medical decision making (see chart for details).  Patient presented to the emergency department today with confusion and weakness. Notably patient was significantly bradycardic. It was primarily in the 30s here in the emergency department. Blood pressure was okay and patient was awake and alert with only slight confusion. Digoxin level within normal limits. Discussed with Dr. Clayborn Bigness with cardiology who recommended holding digoxin at this time. UA also consistent with UTI. Will plan on admission, IV abx and continued monitoring.   ____________________________________________   FINAL CLINICAL IMPRESSION(S) / ED DIAGNOSES  Final diagnoses:  Lower urinary tract infectious disease  Confusion  Bradycardia     Note: This dictation was prepared with Dragon dictation. Any  transcriptional errors that result from this process are unintentional     Nance Pear, MD 09/29/16 1256    Nance Pear, MD 09/29/16 1332

## 2016-09-30 LAB — PROTIME-INR
INR: 1.42
PROTHROMBIN TIME: 17.2 s — AB (ref 11.4–15.2)

## 2016-09-30 LAB — BASIC METABOLIC PANEL
Anion gap: 8 (ref 5–15)
BUN: 22 mg/dL — AB (ref 6–20)
CHLORIDE: 107 mmol/L (ref 101–111)
CO2: 21 mmol/L — ABNORMAL LOW (ref 22–32)
Calcium: 8 mg/dL — ABNORMAL LOW (ref 8.9–10.3)
Creatinine, Ser: 0.74 mg/dL (ref 0.44–1.00)
GFR calc Af Amer: >60 mL/min (ref >60)
GLUCOSE: 89 mg/dL (ref 65–99)
POTASSIUM: 3.9 mmol/L (ref 3.5–5.1)
Sodium: 136 mmol/L (ref 135–145)

## 2016-09-30 LAB — CBC
HCT: 36.1 % (ref 35.0–47.0)
HEMOGLOBIN: 12 g/dL (ref 12.0–16.0)
MCH: 31.4 pg (ref 26.0–34.0)
MCHC: 33.3 g/dL (ref 32.0–36.0)
MCV: 94.3 fL (ref 80.0–100.0)
PLATELETS: 212 10*3/uL (ref 150–440)
RBC: 3.83 MIL/uL (ref 3.80–5.20)
RDW: 14.7 % — ABNORMAL HIGH (ref 11.5–14.5)
WBC: 8.4 10*3/uL (ref 3.6–11.0)

## 2016-09-30 LAB — APTT
aPTT: 35 seconds (ref 24–36)
aPTT: 128 seconds — ABNORMAL HIGH (ref 24–36)

## 2016-09-30 LAB — HEPARIN LEVEL (UNFRACTIONATED)
Heparin Unfractionated: 0.89 IU/mL — ABNORMAL HIGH (ref 0.30–0.70)
Heparin Unfractionated: 0.7 IU/mL (ref 0.30–0.70)

## 2016-09-30 LAB — TSH: TSH: 2.42 u[IU]/mL (ref 0.350–4.500)

## 2016-09-30 MED ORDER — ENSURE ENLIVE PO LIQD
237.0000 mL | Freq: Three times a day (TID) | ORAL | Status: DC
  Administered 2016-09-30 – 2016-10-04 (×6): 237 mL via ORAL

## 2016-09-30 MED ORDER — CARBIDOPA-LEVODOPA ER 50-200 MG PO TBCR: 3.0000 | EXTENDED_RELEASE_TABLET | Freq: Three times a day (TID) | ORAL | Status: DC

## 2016-09-30 MED ORDER — HEPARIN (PORCINE) IN NACL 100-0.45 UNIT/ML-% IJ SOLN
800.0000 [IU]/h | INTRAMUSCULAR | Status: DC
  Administered 2016-09-30: 900 [IU]/h via INTRAVENOUS
  Administered 2016-10-01: 800 [IU]/h via INTRAVENOUS
  Filled 2016-09-30 (×2): qty 250

## 2016-09-30 MED ORDER — HEPARIN BOLUS VIA INFUSION
2500.0000 [IU] | Freq: Once | INTRAVENOUS | Status: AC
  Administered 2016-09-30: 2500 [IU] via INTRAVENOUS
  Filled 2016-09-30: qty 2500

## 2016-09-30 MED ORDER — CARBIDOPA-LEVODOPA ER 50-200 MG PO TBCR
3.0000 | EXTENDED_RELEASE_TABLET | Freq: Three times a day (TID) | ORAL | Status: DC
  Administered 2016-09-30 – 2016-10-04 (×14): 3 via ORAL
  Filled 2016-09-30 (×14): qty 3

## 2016-09-30 NOTE — Progress Notes (Signed)
ANTICOAGULATION CONSULT NOTE - Initial Consult  Pharmacy Consult for heparin gtt Indication: atrial fibrillation  No Known Allergies  Patient Measurements: Height: 5' (152.4 cm) Weight: 141 lb 4.8 oz (64.1 kg) IBW/kg (Calculated) : 45.5 Heparin Dosing Weight: 59kg  Vital Signs: Temp: 97.7 F (36.5 C) (09/03 0800) Temp Source: Oral (09/03 0800) BP: 134/62 (09/03 0800) Pulse Rate: 31 (09/03 0800)  Labs:  Recent Labs  09/29/16 1026 09/30/16 0555  HGB 12.3 12.0  HCT 36.7 36.1  PLT 244 212  CREATININE 0.85 0.74  TROPONINI 0.03*  --     Estimated Creatinine Clearance: 39.8 mL/min (by C-G formula based on SCr of 0.74 mg/dL).   Medical History: Past Medical History:  Diagnosis Date  . A-fib ([place])   . Anemia   . Arthritis    osteoarthritis  . Atrophic vaginitis   . Cancer ([place])    Breast  . Cataract   . Diverticulosis   . DVT (deep vein thrombosis) in pregnancy ([place])   . Hiatal hernia   . Hypertension   . Incontinence   . Osteoporosis   . Parkinson disease ([place])   . PE (pulmonary thromboembolism) ([place])   . Vitamin B12 deficiency     Medications:  Prescriptions Prior to Admission  Medication Sig Dispense Refill Last Dose  . acetaminophen (TYLENOL) 500 MG tablet Take 500 mg by mouth every 6 (six) hours as needed (pain).   PRN at PRN  . carbidopa-levodopa (SINEMET CR) 50-200 MG tablet Take 3 tablets by mouth 3 (three) times daily.    09/29/2016 at 0700  . conjugated estrogens (PREMARIN) vaginal cream Place 1 Applicatorful vaginally daily. Apply 0.5mg  (pea-sized amount)  just inside the vaginal introitus with a finger-tip every night for two weeks and then Monday, Wednesday and Friday nights. (Patient taking differently: Place 1 Applicatorful vaginally once a week. On Wednesday) 30 g 12 09/25/2016  . cyanocobalamin (,VITAMIN B-12,) 1000 MCG/ML injection INJECT 1ML INTO MUSCLE ONCE A MONTH AS DIRECTED   09/26/2016 at 0800  . digoxin (LANOXIN) 0.125 MG tablet Take 1  tablet (0.125 mg total) by mouth daily. 30 tablet 0 09/29/2016 at 0700  . diltiazem (CARDIZEM CD) 120 MG 24 hr capsule Take 1 capsule (120 mg total) by mouth daily. 30 capsule 0 09/28/2016 at 1200  . fesoterodine (TOVIAZ) 4 MG TB24 tablet Take 1 tablet (4 mg total) by mouth daily. 30 tablet 11 09/29/2016 at 0700  . hydroxypropyl methylcellulose / hypromellose (ISOPTO TEARS / GONIOVISC) 2.5 % ophthalmic solution Place 1 drop into both eyes 3 (three) times daily as needed for dry eyes.   PRN at PRN  . midodrine (PROAMATINE) 2.5 MG tablet Take 2 tablets (5 mg total) by mouth 3 (three) times daily with meals. 90 tablet 0 09/29/2016 at 0700  . XARELTO 20 MG TABS tablet Take 20 mg by mouth daily.   0 09/29/2016 at 0700   Scheduled:  . carbidopa-levodopa  3 tablet Oral TID  . conjugated estrogens  1 Applicatorful Vaginal Daily  . fesoterodine  4 mg Oral Daily  . heparin  2,500 Units Intravenous Once  . midodrine  5 mg Oral TID WC   Infusions:  . cefTRIAXone (ROCEPHIN)  IV 1 g (09/30/16 0944)  . heparin     PRN: acetaminophen **OR** acetaminophen, atropine, ondansetron **OR** ondansetron (ZOFRAN) IV, polyethylene glycol, polyvinyl alcohol  Assessment: 83 year old female previously on xarelto 20mg  daily (LD 9/2@0700 ) presents with Afib, pharmacy consulted to manage heparin gtt.   Goal  of Therapy:  Heparin level 0.3-0.7 units/ml Monitor platelets by anticoagulation protocol: Yes   Plan:  Give 2500 units bolus x 1 Start heparin infusion at 900 units/hr Check anti-Xa level in 8 hours and daily while on heparin Continue to monitor H&H and platelets  Donna Christen Quin [place] 09/30/2016,11:02 AM

## 2016-09-30 NOTE — Consult Note (Signed)
Reason for Consult:bradycardia atrial fibrillation Referring Physician: Dr. Ola Spurr, Dr. Margaretmary Eddy hospitalist Dr. Ubaldo Glassing Cardiologist  Laura Cisneros is an 83 y.o. female.  HPI: The patient's 83 year old white female history of chronic atrial fibrillation 1 relative Parkinson's disease history of DVT history of pulmonary embolus brought to the emergency room by her son because of altered mental status weakness fatigue. Patient was found mentioned to have heart rates in the 30s patient is being maintained on diltiazem and digoxin denies any tobacco spelled to syncope. Patient has had significant weakness fatigue and lack of energy over the last few weeks.Denies any fever chills or sweats but has recently had some urinary symptoms potentially contributaltered mental status since she's been brought for evaluation of possible urinary tract infection.enies any bleeding on Xarelto.  Past Medical History:  Diagnosis Date  . A-fib ([place])   . Anemia   . Arthritis    osteoarthritis  . Atrophic vaginitis   . Cancer ([place])    Breast  . Cataract   . Diverticulosis   . DVT (deep vein thrombosis) in pregnancy ([place])   . Hiatal hernia   . Hypertension   . Incontinence   . Osteoporosis   . Parkinson disease ([place])   . PE (pulmonary thromboembolism) ([place])   . Vitamin B12 deficiency     Past Surgical History:  Procedure Laterality Date  . ABDOMINAL HYSTERECTOMY    . ABDOMINAL HYSTERECTOMY    . BREAST BIOPSY Left 10/2012   benign  . BREAST BIOPSY Left 11/22/2015   invasive mammary ca   . BREAST EXCISIONAL BIOPSY Left 12/08/2015   lumpectomy  . EYE SURGERY Bilateral    Catarct Extraction with IOL  . FOOT FRACTURE SURGERY Right    pinning, Dr. Francia Greaves, [facility]  . INCONTINENCE SURGERY    . PARTIAL MASTECTOMY WITH AXILLARY SENTINEL LYMPH NODE BIOPSY Left 12/08/2015   Procedure: PARTIAL MASTECTOMY WITH AXILLARY SENTINEL LYMPH NODE BIOPSY;  Surgeon: [name], MD;  Location: [facility] ORS;  Service:  General;  Laterality: Left;    Family History  Problem Relation Age of Onset  . Esophageal cancer Sister   . Kidney disease Neg Hx   . Bladder Cancer Neg Hx   . Breast cancer Neg Hx     Social History:  reports that she has never smoked. She has never used smokeless tobacco. She reports that she does not drink alcohol or use drugs.  Allergies: No Known Allergies  Medications: I have reviewed the patient's current medications.  Results for orders placed or performed during the hospital encounter of 09/29/16 (from the past 48 hour(s))  Basic metabolic panel     Status: Abnormal   Collection Time: 09/29/16 10:26 AM  Result Value Ref Range   Sodium 136 135 - 145 mmol/L   Potassium 4.1 3.5 - 5.1 mmol/L   Chloride 104 101 - 111 mmol/L   CO2 23 22 - 32 mmol/L   Glucose, Bld 107 (H) 65 - 99 mg/dL   BUN 24 (H) 6 - 20 mg/dL   Creatinine, Ser 0.85 0.44 - 1.00 mg/dL   Calcium 8.6 (L) 8.9 - 10.3 mg/dL   GFR calc non Af Amer 59 (L) >60 mL/min   GFR calc Af Amer >60 >60 mL/min    Comment: (NOTE) The eGFR has been calculated using the CKD EPI equation. This calculation has not been validated in all clinical situations. eGFR's persistently <60 mL/min signify possible Chronic Kidney Disease.    Anion gap 9 5 - 15  CBC     Status: Abnormal   Collection Time: 09/29/16 10:26 AM  Result Value Ref Range   WBC 10.3 3.6 - 11.0 K/uL   RBC 3.87 3.80 - 5.20 MIL/uL   Hemoglobin 12.3 12.0 - 16.0 g/dL   HCT 36.7 35.0 - 47.0 %   MCV 94.8 80.0 - 100.0 fL   MCH 31.9 26.0 - 34.0 pg   MCHC 33.6 32.0 - 36.0 g/dL   RDW 14.9 (H) 11.5 - 14.5 %   Platelets 244 150 - 440 K/uL  Troponin I     Status: Abnormal   Collection Time: 09/29/16 10:26 AM  Result Value Ref Range   Troponin I 0.03 (HH) <0.03 ng/mL    Comment: CRITICAL RESULT CALLED TO, READ BACK BY AND VERIFIED WITH DONALD SWEENEY @ 1113 09/29/16 BY [facility]   Digoxin level     Status: None   Collection Time: 09/29/16 10:26 AM  Result Value Ref Range    Digoxin Level 1.5 0.8 - 2.0 ng/mL  Urinalysis, Complete w Microscopic     Status: Abnormal   Collection Time: 09/29/16 12:04 PM  Result Value Ref Range   Color, Urine AMBER (A) YELLOW    Comment: BIOCHEMICALS MAY BE AFFECTED BY COLOR   APPearance HAZY (A) CLEAR   Specific Gravity, Urine 1.024 1.005 - 1.030   pH 5.0 5.0 - 8.0   Glucose, UA NEGATIVE NEGATIVE mg/dL   Hgb urine dipstick NEGATIVE NEGATIVE   Bilirubin Urine NEGATIVE NEGATIVE   Ketones, ur 5 (A) NEGATIVE mg/dL   Protein, ur 100 (A) NEGATIVE mg/dL   Nitrite POSITIVE (A) NEGATIVE   Leukocytes, UA TRACE (A) NEGATIVE   RBC / HPF 0-5 0 - 5 RBC/hpf   WBC, UA TOO NUMEROUS TO COUNT 0 - 5 WBC/hpf   Bacteria, UA MANY (A) NONE SEEN   Squamous Epithelial / LPF 6-30 (A) NONE SEEN    Dg Chest Port 1 View  Result Date: 09/29/2016 CLINICAL DATA:  Non smoker, low heart rate in ED, c/o confusion x 2 days and palpitations this morning. Son also states patient has become weaker over this week. Had been walking with a walker, but all week, strength has declined EXAM: PORTABLE CHEST - 1 VIEW COMPARISON:  07/04/2016 FINDINGS: Mild central pulmonary vascular congestion and perihilar interstitial prominence as before. Moderate hiatal hernia. No definite airspace disease. Mild cardiomegaly.  Atheromatous aorta. No definite effusion.  No pneumothorax. Visualized bones unremarkable. IMPRESSION: 1. Stable mild cardiomegaly and pulmonary vascular congestion. 2. No definite acute disease. 3. Hiatal hernia 4. Aortic Atherosclerosis (ICD10-170.0) Electronically Signed   By: Lucrezia Europe M.D.   On: 09/29/2016 10:53    Review of Systems  Constitutional: Positive for diaphoresis and malaise/fatigue.  HENT: Positive for congestion.   Eyes: Negative.   Respiratory: Positive for shortness of breath.   Cardiovascular: Positive for orthopnea and leg swelling.  Gastrointestinal: Positive for heartburn.  Genitourinary: Positive for dysuria, frequency and urgency.   Musculoskeletal: Positive for myalgias.  Skin: Negative.   Neurological: Positive for tremors and weakness.  Endo/Heme/Allergies: Negative.   Psychiatric/Behavioral: Positive for memory loss.   Blood pressure (!) 145/99, pulse 71, temperature 98.2 F (36.8 C), temperature source Oral, resp. rate 20, height 5' (1.524 m), weight 64.1 kg (141 lb 4.8 oz), SpO2 99 %. Physical Exam  Nursing note and vitals reviewed. Constitutional: She appears well-developed. She appears lethargic.  HENT:  Head: Normocephalic and atraumatic.  Eyes: Pupils are equal, round, and reactive to light. Conjunctivae  and EOM are normal.  Neck: Normal range of motion. Neck supple.  Cardiovascular: S1 normal, S2 normal and normal pulses.  An irregularly irregular rhythm present. Bradycardia present.   Murmur heard.  Systolic murmur is present with a grade of 2/6  Respiratory: Effort normal and breath sounds normal.  GI: Soft. Bowel sounds are normal.  Musculoskeletal: Normal range of motion.  Neurological: She appears lethargic.  Lethargic Confusion Weakness Fatigue  Skin: Skin is warm and dry.  Psychiatric: Her affect is blunt. Her speech is tangential. She is slowed and withdrawn. Cognition and memory are impaired. She exhibits abnormal recent memory.    Assessment/Plan: Bradycardia Atrial fibrillation Confusion Parkinson's History of breast cancer Fatigue Urinary tract infection Anemia History DVT Coagulopathy secondary to Xarelto . Agree with admission to telemetry Hold digoxin and diltiazem Follow-up telemetry EKGs Continue Xarelto for atrial fibrillation Continue broad-spectrum metabolic therapy for possible UTI Do not recommend temporary pacemaker at this point because hemodynamically stable Monitor heart rate for the next 24-48 hrs Agree with Sinemet for Parkinson's  Baylie Drakes D Shantay Sonn 09/30/2016, 1:20 AM

## 2016-09-30 NOTE — Care Management (Addendum)
Patient is admitted from home where she lives with her son for altered mental status due to acute cystitis and bradycardia. Chronic Xarelto.  She has medicaid PCS services in the home.  Has had [facility] in the past.  Reaching out to agency to determine is still receiving services. Skilled nursing placement will not be considered by family. Patient may require a pacemaker.

## 2016-09-30 NOTE — Progress Notes (Signed)
Initial Nutrition Assessment  DOCUMENTATION CODES:   Not applicable  INTERVENTION:  1. Recommend provide Ensure Enlive po TID, each supplement provides 350 kcal and 20 grams of protein 2. Magic cup TID with meals, each supplement provides 290 kcal and 9 grams of protein 3. Recommend MVI w/ Minerals  NUTRITION DIAGNOSIS:   Inadequate oral intake related to acute illness as evidenced by per patient/family report.  GOAL:   Patient will meet greater than or equal to 90% of their needs  MONITOR:   PO intake, I & O's, Labs, Supplement acceptance, Weight trends  REASON FOR ASSESSMENT:   Malnutrition Screening Tool    ASSESSMENT:   Laura Cisneros is an 83 yo female with PMHHx of A-Fib on Xarelto, Parkinson's, Breast CA s/p partial mastectomy, HTN, DVT, presents with UTI, AMS, generalized weakness, symptomatic bradycardia   Spoke with patient, Son at bedside. He reports she has eaten poorly for "the past several weeks." States she has mostly been living off of ensure and magic cup at home, drinks 3 ensure daily. She has been gaining weight from her previous hospitalization in June, but reports she has started losing weight again from 137# to 125#. Unable to corroborate this with chart. She complains of no appetite, dysgeusia, and an occasional feeling of food getting stuck in her upper chest.  Nutrition-Focused physical exam completed. Findings are mild to moderate fat depletion at eyes and arms, mild-moderate muscle depletion at temples, and no edema.   Labs reviewed Medications reviewed. UOP 331mL yesterday.  Diet Order:  Diet Heart Room service appropriate? Yes; Fluid consistency: Thin  Skin:  Reviewed, no issues  Last BM:  09/29/2016  Height:   Ht Readings from Last 1 Encounters:  09/29/16 5' (1.524 m)    Weight:   Wt Readings from Last 1 Encounters:  09/29/16 141 lb 4.8 oz (64.1 kg)    Ideal Body Weight:  45.45 kg  BMI:  Body mass index is 27.6 kg/m.  Estimated  Nutritional Needs:   Kcal:  1250-1500 calories (25-30 cal/kg ABW)  Protein:  76-90 grams  Fluid:  > 1.5L  EDUCATION NEEDS:   No education needs identified at this time  Laura Cisneros. Laura Kassebaum, MS, RD LDN Inpatient Clinical Dietitian Pager [phone]

## 2016-09-30 NOTE — Progress Notes (Addendum)
ANTICOAGULATION CONSULT NOTE - Initial Consult  Pharmacy Consult for heparin gtt Indication: atrial fibrillation  No Known Allergies  Patient Measurements: Height: 5' (152.4 cm) Weight: 141 lb 4.8 oz (64.1 kg) IBW/kg (Calculated) : 45.5 Heparin Dosing Weight: 59kg  Vital Signs: Temp: 98.3 F (36.8 C) (09/03 1929) Temp Source: Oral (09/03 1929) BP: 167/62 (09/03 1929) Pulse Rate: 75 (09/03 1929)  Labs:  Recent Labs  09/29/16 1026 09/30/16 0555 09/30/16 1117 09/30/16 1957  HGB 12.3 12.0  --   --   HCT 36.7 36.1  --   --   PLT 244 212  --   --   APTT  --   --  35 128*  LABPROT  --   --  17.2*  --   INR  --   --  1.42  --   HEPARINUNFRC  --   --  0.89* 0.70  CREATININE 0.85 0.74  --   --   TROPONINI 0.03*  --   --   --     Estimated Creatinine Clearance: 39.8 mL/min (by C-G formula based on SCr of 0.74 mg/dL).   Medical History: Past Medical History:  Diagnosis Date  . A-fib ([place])   . Anemia   . Arthritis    osteoarthritis  . Atrophic vaginitis   . Cancer ([place])    Breast  . Cataract   . Diverticulosis   . DVT (deep vein thrombosis) in pregnancy ([place])   . Hiatal hernia   . Hypertension   . Incontinence   . Osteoporosis   . Parkinson disease ([place])   . PE (pulmonary thromboembolism) ([place])   . Vitamin B12 deficiency     Medications:  Prescriptions Prior to Admission  Medication Sig Dispense Refill Last Dose  . acetaminophen (TYLENOL) 500 MG tablet Take 500 mg by mouth every 6 (six) hours as needed (pain).   PRN at PRN  . carbidopa-levodopa (SINEMET CR) 50-200 MG tablet Take 3 tablets by mouth 3 (three) times daily.    09/29/2016 at 0700  . conjugated estrogens (PREMARIN) vaginal cream Place 1 Applicatorful vaginally daily. Apply 0.5mg  (pea-sized amount)  just inside the vaginal introitus with a finger-tip every night for two weeks and then Monday, Wednesday and Friday nights. (Patient taking differently: Place 1 Applicatorful vaginally once a week. On  Wednesday) 30 g 12 09/25/2016  . cyanocobalamin (,VITAMIN B-12,) 1000 MCG/ML injection INJECT 1ML INTO MUSCLE ONCE A MONTH AS DIRECTED   09/26/2016 at 0800  . digoxin (LANOXIN) 0.125 MG tablet Take 1 tablet (0.125 mg total) by mouth daily. 30 tablet 0 09/29/2016 at 0700  . diltiazem (CARDIZEM CD) 120 MG 24 hr capsule Take 1 capsule (120 mg total) by mouth daily. 30 capsule 0 09/28/2016 at 1200  . fesoterodine (TOVIAZ) 4 MG TB24 tablet Take 1 tablet (4 mg total) by mouth daily. 30 tablet 11 09/29/2016 at 0700  . hydroxypropyl methylcellulose / hypromellose (ISOPTO TEARS / GONIOVISC) 2.5 % ophthalmic solution Place 1 drop into both eyes 3 (three) times daily as needed for dry eyes.   PRN at PRN  . midodrine (PROAMATINE) 2.5 MG tablet Take 2 tablets (5 mg total) by mouth 3 (three) times daily with meals. 90 tablet 0 09/29/2016 at 0700  . XARELTO 20 MG TABS tablet Take 20 mg by mouth daily.   0 09/29/2016 at 0700   Scheduled:  . carbidopa-levodopa  3 tablet Oral TID  . conjugated estrogens  1 Applicatorful Vaginal Daily  . feeding supplement (ENSURE ENLIVE)  237 mL Oral TID BM  . fesoterodine  4 mg Oral Daily  . midodrine  5 mg Oral TID WC   Infusions:  . cefTRIAXone (ROCEPHIN)  IV Stopped (09/30/16 1014)  . heparin 900 Units/hr (09/30/16 1221)   PRN: acetaminophen **OR** acetaminophen, atropine, ondansetron **OR** ondansetron (ZOFRAN) IV, polyethylene glycol, polyvinyl alcohol  Assessment: 83 year old female previously on xarelto 20mg  daily (LD 9/2@0700 ) presents with Afib, pharmacy consulted to manage heparin gtt.   Goal of Therapy:  Heparin level 0.3-0.7 units/ml Monitor platelets by anticoagulation protocol: Yes   Plan:  Give 2500 units bolus x 1 Start heparin infusion at 900 units/hr Check anti-Xa level in 8 hours and daily while on heparin Continue to monitor H&H and platelets   9/03 @ 20:00 :  APTT = 128, HL = 0.7 Will decrease heparin drip to 800 units/hr and recheck aPTT 6 hrs after rate  change.   Will recheck HL on 9/4 with AM labs.   Robbins,Jason D 09/30/2016,9:40 PM   9/4 0430 aPTT 81, heparin level 0.51. Continue current regimen. Levels appear to be correlating in therapeutic range so will continue to follow and adjust by heparin levels only. Next heparin level and CBC with tomorrow AM labs.  Sim Boast, PharmD, BCPS  10/01/16 6:13 AM

## 2016-09-30 NOTE — Progress Notes (Signed)
[place] [facility] [place] NAME: Laura Cisneros    MR#:  [id number]  DATE OF BIRTH:  [birth year]  SUBJECTIVE:   Patient with Fatigue and weakness. Heart rate still low  REVIEW OF SYSTEMS:    Review of Systems  Constitutional: Negative for fever, chills weight loss Positive for fatigue and generalized weakness HENT: Negative for ear pain, nosebleeds, congestion, facial swelling, rhinorrhea, neck pain, neck stiffness and ear discharge.   Respiratory: Negative for cough, positive for shortness of breath and no  wheezing  Cardiovascular: Negative for chest pain, palpitations and leg swelling.  Gastrointestinal: Negative for heartburn, abdominal pain, vomiting, diarrhea or consitpation Genitourinary: Negative for dysuria, urgency, frequency, hematuria Musculoskeletal: Negative for back pain or joint pain Neurological: Negative for dizziness, seizures, syncope, focal weakness,  numbness and headaches.  Hematological: Does not bruise/bleed easily.  Psychiatric/Behavioral: Negative for hallucinations, confusion, dysphoric mood    Tolerating Diet: yes      DRUG ALLERGIES:  No Known Allergies  VITALS:  Blood pressure 134/62, pulse (!) 31, temperature 97.7 F (36.5 C), temperature source Oral, resp. rate 20, height 5' (1.524 m), weight 64.1 kg (141 lb 4.8 oz), SpO2 95 %.  PHYSICAL EXAMINATION:  Constitutional: Appears well-developed and well-nourished. No distress. HENT: Normocephalic. Marland Kitchen Oropharynx is clear and moist.  Eyes: Conjunctivae and EOM are normal. PERRLA, no scleral icterus.  Neck: Normal ROM. Neck supple. No JVD. No tracheal deviation. CVS: Bradycardia S1/S2 +, no murmurs, no gallops, no carotid bruit.  Pulmonary: Effort and breath sounds normal, no stridor, rhonchi, wheezes, rales.  Abdominal: Soft. BS +,  no distension, tenderness, rebound or guarding.  Musculoskeletal: Normal range of motion. No edema and no tenderness.  Neuro: Alert.  CN 2-12 grossly intact. No focal deficits. Skin: Skin is warm and dry. No rash noted. Psychiatric: Normal mood and affect.      LABORATORY PANEL:   CBC  Recent Labs Lab 09/30/16 0555  WBC 8.4  HGB 12.0  HCT 36.1  PLT 212   ------------------------------------------------------------------------------------------------------------------  Chemistries   Recent Labs Lab 09/30/16 0555  NA 136  K 3.9  CL 107  CO2 21*  GLUCOSE 89  BUN 22*  CREATININE 0.74  CALCIUM 8.0*   ------------------------------------------------------------------------------------------------------------------  Cardiac Enzymes  Recent Labs Lab 09/29/16 1026  TROPONINI 0.03*   ------------------------------------------------------------------------------------------------------------------  RADIOLOGY:  Dg Chest Port 1 View  Result Date: 09/29/2016 CLINICAL DATA:  Non smoker, low heart rate in ED, c/o confusion x 2 days and palpitations this morning. Son also states patient has become weaker over this week. Had been walking with a walker, but all week, strength has declined EXAM: PORTABLE CHEST - 1 VIEW COMPARISON:  07/04/2016 FINDINGS: Mild central pulmonary vascular congestion and perihilar interstitial prominence as before. Moderate hiatal hernia. No definite airspace disease. Mild cardiomegaly.  Atheromatous aorta. No definite effusion.  No pneumothorax. Visualized bones unremarkable. IMPRESSION: 1. Stable mild cardiomegaly and pulmonary vascular congestion. 2. No definite acute disease. 3. Hiatal hernia 4. Aortic Atherosclerosis (ICD10-170.0) Electronically Signed   By: Lucrezia Europe M.D.   On: 09/29/2016 10:53     ASSESSMENT AND PLAN:    83 year old female with chronic atrial fibrillation on anticoagulation and Parkinson's disease who presented with altered mental status and found to have significant bradycardia.  1. Symptomatic bradycardia with fatigue, shortness of breath and generalized  weakness: Rate limiting medications are discontinued Patient likely with sick sinus syndrome and will need pacemaker Appreciate cartilage consultation  2. Slow atrial  fibrillation: Heart rate medications are on hold due to problem #1 Change oral intake on admission to heparin drip in anticipation of pacemaker  3. Acute metabolic encephalopathy in the setting of UTI which is resolving:.  4. Urinary tract infection: Follow-up on final urine culture Continue Rocephin  5. Orthostatic hypotension on midodrine  6 Parkinson's disease: Continue Sinemet   Management plans discussed with the patient and family and they are in agreement.  CODE STATUS: FULL  TOTAL TIME TAKING CARE OF THIS PATIENT: 33 minutes.     POSSIBLE D/C 2-3 days, DEPENDING ON CLINICAL CONDITION.   Edouard Gikas M.D on 09/30/2016 at 10:40 AM  Between 7am to 6pm - Pager - [phone] After 6pm go to [email or web address] - password [email or web address] [facility]  Office  [phone]  CC: Primary care physician; Leonel Ramsay, MD  Note: This dictation was prepared with Dragon dictation along with smaller phrase technology. Any transcriptional errors that result from this process are unintentional.

## 2016-10-01 LAB — CBC
HCT: 39.5 % (ref 35.0–47.0)
Hemoglobin: 13 g/dL (ref 12.0–16.0)
MCH: 31.4 pg (ref 26.0–34.0)
MCHC: 33 g/dL (ref 32.0–36.0)
MCV: 95.1 fL (ref 80.0–100.0)
PLATELETS: 218 10*3/uL (ref 150–440)
RBC: 4.15 MIL/uL (ref 3.80–5.20)
RDW: 14.7 % — AB (ref 11.5–14.5)
WBC: 7.1 10*3/uL (ref 3.6–11.0)

## 2016-10-01 LAB — BASIC METABOLIC PANEL
ANION GAP: 7 (ref 5–15)
BUN: 17 mg/dL (ref 6–20)
CALCIUM: 8.4 mg/dL — AB (ref 8.9–10.3)
CO2: 22 mmol/L (ref 22–32)
Chloride: 108 mmol/L (ref 101–111)
Creatinine, Ser: 0.67 mg/dL (ref 0.44–1.00)
Glucose, Bld: 94 mg/dL (ref 65–99)
Potassium: 4 mmol/L (ref 3.5–5.1)
Sodium: 137 mmol/L (ref 135–145)

## 2016-10-01 LAB — APTT: APTT: 81 s — AB (ref 24–36)

## 2016-10-01 LAB — HEPARIN LEVEL (UNFRACTIONATED): HEPARIN UNFRACTIONATED: 0.51 [IU]/mL (ref 0.30–0.70)

## 2016-10-01 MED ORDER — SODIUM CHLORIDE 0.9 % IR SOLN
80.0000 mg | Status: DC
  Filled 2016-10-01: qty 2

## 2016-10-01 MED ORDER — CEFAZOLIN SODIUM-DEXTROSE 2-4 GM/100ML-% IV SOLN
2.0000 g | INTRAVENOUS | Status: AC
  Administered 2016-10-02: 2 g via INTRAVENOUS
  Filled 2016-10-01: qty 100

## 2016-10-01 MED ORDER — CHLORHEXIDINE GLUCONATE 4 % EX LIQD
60.0000 mL | Freq: Once | CUTANEOUS | Status: AC
  Administered 2016-10-02: 4 via TOPICAL

## 2016-10-01 MED ORDER — CHLORHEXIDINE GLUCONATE 4 % EX LIQD
60.0000 mL | Freq: Once | CUTANEOUS | Status: AC
  Administered 2016-10-01: 4 via TOPICAL

## 2016-10-01 MED ORDER — SODIUM CHLORIDE 0.9 % IV SOLN
INTRAVENOUS | Status: DC
  Administered 2016-10-02: 07:00:00 via INTRAVENOUS

## 2016-10-01 MED ORDER — ESTROGENS, CONJUGATED 0.625 MG/GM VA CREA: 1.0000 | TOPICAL_CREAM | VAGINAL | Status: DC

## 2016-10-01 NOTE — Progress Notes (Signed)
[facility] [place] NAME: Laura Cisneros    MR#:  [id number]  DATE OF BIRTH:  [birth year]  SUBJECTIVE:  Heart rates still low. Patient still complaining of fatigue and shortness of breath.  REVIEW OF SYSTEMS:    Review of Systems  Constitutional: Negative for fever, chills weight loss Positive for fatigue and generalized weakness HENT: Negative for ear pain, nosebleeds, congestion, facial swelling, rhinorrhea, neck pain, neck stiffness and ear discharge.   Respiratory: Negative for cough, positive for shortness of breath and no  wheezing  Cardiovascular: Negative for chest pain, palpitations and leg swelling.  Gastrointestinal: Negative for heartburn, abdominal pain, vomiting, diarrhea or consitpation Genitourinary: Negative for dysuria, urgency, frequency, hematuria Musculoskeletal: Negative for back pain or joint pain Neurological: Negative for dizziness, seizures, syncope, focal weakness,  numbness and headaches.  Hematological: Does not bruise/bleed easily.  Psychiatric/Behavioral: Negative for hallucinations, confusion, dysphoric mood    Tolerating Diet: yes      DRUG ALLERGIES:  No Known Allergies  VITALS:  Blood pressure (!) 188/79, pulse (!) 34, temperature 97.7 F (36.5 C), temperature source Oral, resp. rate 18, height 5' (1.524 m), weight 64.1 kg (141 lb 4.8 oz), SpO2 97 %.  PHYSICAL EXAMINATION:  Constitutional: Appears well-developed and well-nourished. No distress. HENT: Normocephalic. Marland Kitchen Oropharynx is clear and moist.  Eyes: Conjunctivae and EOM are normal. PERRLA, no scleral icterus.  Neck: Normal ROM. Neck supple. No JVD. No tracheal deviation. CVS: Bradycardia S1/S2 +, no murmurs, no gallops, no carotid bruit.  Pulmonary: Effort and breath sounds normal, no stridor, rhonchi, wheezes, rales.  Abdominal: Soft. BS +,  no distension, tenderness, rebound or guarding.  Musculoskeletal: Normal range of motion. No edema and  no tenderness.  Neuro: Alert. CN 2-12 grossly intact. No focal deficits. Skin: Skin is warm and dry. No rash noted. Psychiatric: Normal mood and affect.      LABORATORY PANEL:   CBC  Recent Labs Lab 10/01/16 0427  WBC 7.1  HGB 13.0  HCT 39.5  PLT 218   ------------------------------------------------------------------------------------------------------------------  Chemistries   Recent Labs Lab 10/01/16 0427  NA 137  K 4.0  CL 108  CO2 22  GLUCOSE 94  BUN 17  CREATININE 0.67  CALCIUM 8.4*   ------------------------------------------------------------------------------------------------------------------  Cardiac Enzymes  Recent Labs Lab 09/29/16 1026  TROPONINI 0.03*   ------------------------------------------------------------------------------------------------------------------  RADIOLOGY:  Dg Chest Port 1 View  Result Date: 09/29/2016 CLINICAL DATA:  Non smoker, low heart rate in ED, c/o confusion x 2 days and palpitations this morning. Son also states patient has become weaker over this week. Had been walking with a walker, but all week, strength has declined EXAM: PORTABLE CHEST - 1 VIEW COMPARISON:  07/04/2016 FINDINGS: Mild central pulmonary vascular congestion and perihilar interstitial prominence as before. Moderate hiatal hernia. No definite airspace disease. Mild cardiomegaly.  Atheromatous aorta. No definite effusion.  No pneumothorax. Visualized bones unremarkable. IMPRESSION: 1. Stable mild cardiomegaly and pulmonary vascular congestion. 2. No definite acute disease. 3. Hiatal hernia 4. Aortic Atherosclerosis (ICD10-170.0) Electronically Signed   By: Lucrezia Europe M.D.   On: 09/29/2016 10:53     ASSESSMENT AND PLAN:    83 year old female with chronic atrial fibrillation on anticoagulation and Parkinson's disease who presented with altered mental status and found to have significant bradycardia.  1. Symptomatic bradycardia with fatigue, shortness  of breath and generalized weakness: Rate limiting medications Have been discontinued, despite this heart rate remains low. Patient will need pacemaker which is  planned for tomorrow.   2. Slow atrial fibrillation: Heart rate medications are on hold due to problem #1 Continue heparin drip   3. Acute metabolic encephalopathy in the setting of UTI which has resolved.  4. Urinary tract infection: Urine cultures pending Continue Rocephin  5. Orthostatic hypotension on midodrine  6 Parkinson's disease: Continue Sinemet   Management plans discussed with the patient and family and they are in agreement.  CODE STATUS: FULL  TOTAL TIME TAKING CARE OF THIS PATIENT: 24 minutes.    Discussed with Dr.Callwood  POSSIBLE D/C 2-3 days, DEPENDING ON CLINICAL CONDITION.   Sam Wunschel M.D on 10/01/2016 at 8:24 AM  Between 7am to 6pm - Pager - [phone] After 6pm go to [email or web address] - password [email or web address] [facility]  Office  [phone]  CC: Primary care physician; Leonel Ramsay, MD  Note: This dictation was prepared with Dragon dictation along with smaller phrase technology. Any transcriptional errors that result from this process are unintentional.

## 2016-10-02 ENCOUNTER — Inpatient Hospital Stay: Payer: Medicare Other | Admitting: Registered Nurse

## 2016-10-02 ENCOUNTER — Inpatient Hospital Stay: Payer: Medicare Other

## 2016-10-02 ENCOUNTER — Encounter: Payer: Self-pay | Admitting: Anesthesiology

## 2016-10-02 ENCOUNTER — Encounter
Admission: EM | Disposition: A | Discharge status: Skilled Nursing Facility | Payer: Self-pay | Source: Home / Self Care | Attending: Internal Medicine

## 2016-10-02 HISTORY — PX: PACEMAKER INSERTION: SHX728

## 2016-10-02 LAB — CBC
HEMATOCRIT: 36.9 % (ref 35.0–47.0)
HEMOGLOBIN: 12.4 g/dL (ref 12.0–16.0)
MCH: 31.6 pg (ref 26.0–34.0)
MCHC: 33.6 g/dL (ref 32.0–36.0)
MCV: 94 fL (ref 80.0–100.0)
Platelets: 229 10*3/uL (ref 150–440)
RBC: 3.93 MIL/uL (ref 3.80–5.20)
RDW: 14.6 % — AB (ref 11.5–14.5)
WBC: 7.1 10*3/uL (ref 3.6–11.0)

## 2016-10-02 LAB — BASIC METABOLIC PANEL
Anion gap: 7 (ref 5–15)
BUN: 15 mg/dL (ref 6–20)
CO2: 22 mmol/L (ref 22–32)
CREATININE: 0.61 mg/dL (ref 0.44–1.00)
Calcium: 8.1 mg/dL — ABNORMAL LOW (ref 8.9–10.3)
Chloride: 109 mmol/L (ref 101–111)
GFR calc non Af Amer: >60 mL/min (ref >60)
GLUCOSE: 101 mg/dL — AB (ref 65–99)
Potassium: 3.7 mmol/L (ref 3.5–5.1)
Sodium: 138 mmol/L (ref 135–145)

## 2016-10-02 LAB — URINE CULTURE

## 2016-10-02 LAB — HEPARIN LEVEL (UNFRACTIONATED): HEPARIN UNFRACTIONATED: 0.33 [IU]/mL (ref 0.30–0.70)

## 2016-10-02 SURGERY — INSERTION, CARDIAC PACEMAKER
Anesthesia: Monitor Anesthesia Care | Site: Shoulder | Laterality: Right | Wound class: Clean

## 2016-10-02 SURGERY — INSERTION, CARDIAC PACEMAKER
Anesthesia: Choice

## 2016-10-02 MED ORDER — CEFAZOLIN SODIUM-DEXTROSE 2-4 GM/100ML-% IV SOLN
INTRAVENOUS | Status: AC
  Filled 2016-10-02: qty 100

## 2016-10-02 MED ORDER — CEFAZOLIN SODIUM-DEXTROSE 1-4 GM/50ML-% IV SOLN
1.0000 g | Freq: Four times a day (QID) | INTRAVENOUS | Status: AC
  Administered 2016-10-02 – 2016-10-03 (×3): 1 g via INTRAVENOUS
  Filled 2016-10-02 (×3): qty 50

## 2016-10-02 MED ORDER — EPHEDRINE SULFATE 50 MG/ML IJ SOLN
INTRAMUSCULAR | Status: DC | PRN
  Administered 2016-10-02 (×2): 5 mg via INTRAVENOUS

## 2016-10-02 MED ORDER — SODIUM CHLORIDE 0.9 % IJ SOLN
INTRAMUSCULAR | Status: AC
  Filled 2016-10-02: qty 50

## 2016-10-02 MED ORDER — ONDANSETRON HCL 4 MG/2ML IJ SOLN: 4.0000 mg | Freq: Four times a day (QID) | INTRAMUSCULAR | Status: DC | PRN

## 2016-10-02 MED ORDER — PROPOFOL 500 MG/50ML IV EMUL
INTRAVENOUS | Status: AC
  Filled 2016-10-02: qty 50

## 2016-10-02 MED ORDER — SODIUM CHLORIDE 0.9 % IR SOLN
Status: DC | PRN
  Administered 2016-10-02: 200 mL

## 2016-10-02 MED ORDER — ACETAMINOPHEN 325 MG PO TABS
325.0000 mg | ORAL_TABLET | ORAL | Status: DC | PRN
Start: 2016-10-02 — End: 2016-10-04

## 2016-10-02 MED ORDER — FENTANYL CITRATE (PF) 100 MCG/2ML IJ SOLN: 25.0000 ug | INTRAMUSCULAR | Status: DC | PRN

## 2016-10-02 MED ORDER — ONDANSETRON HCL 4 MG/2ML IJ SOLN: 4.0000 mg | Freq: Once | INTRAMUSCULAR | Status: DC | PRN

## 2016-10-02 MED ORDER — CIPROFLOXACIN HCL 250 MG PO TABS
250.0000 mg | ORAL_TABLET | Freq: Two times a day (BID) | ORAL | Status: DC
  Administered 2016-10-02 – 2016-10-04 (×5): 250 mg via ORAL
  Filled 2016-10-02 (×6): qty 1

## 2016-10-02 MED ORDER — PROPOFOL 500 MG/50ML IV EMUL
INTRAVENOUS | Status: DC | PRN
  Administered 2016-10-02: 35 ug/kg/min via INTRAVENOUS

## 2016-10-02 MED ORDER — SODIUM CHLORIDE 0.9 % IV SOLN
INTRAVENOUS | Status: DC
  Administered 2016-10-02: 12:00:00 via INTRAVENOUS

## 2016-10-02 MED ORDER — AMLODIPINE BESYLATE 10 MG PO TABS
10.0000 mg | ORAL_TABLET | Freq: Every day | ORAL | Status: DC
  Administered 2016-10-02 – 2016-10-04 (×3): 10 mg via ORAL
  Filled 2016-10-02 (×4): qty 1

## 2016-10-02 MED ORDER — LIDOCAINE 1 % OPTIME INJ - NO CHARGE
INTRAMUSCULAR | Status: DC | PRN
  Administered 2016-10-02: 25 mL

## 2016-10-02 SURGICAL SUPPLY — 34 items
BAG DECANTER FOR FLEXI CONT (MISCELLANEOUS) IMPLANT
BRUSH SCRUB EZ  4% CHG (MISCELLANEOUS) ×2
BRUSH SCRUB EZ 4% CHG (MISCELLANEOUS) ×1 IMPLANT
CABLE SURG 12 DISP A/V CHANNEL (MISCELLANEOUS) ×3 IMPLANT
CANISTER SUCT 1200ML W/VALVE (MISCELLANEOUS) ×3 IMPLANT
CHLORAPREP W/TINT 26ML (MISCELLANEOUS) ×3 IMPLANT
COVER LIGHT HANDLE STERIS (MISCELLANEOUS) ×6 IMPLANT
COVER MAYO STAND STRL (DRAPES) ×3 IMPLANT
DRAPE C-ARM XRAY 36X54 (DRAPES) ×3 IMPLANT
DRSG TEGADERM 4X4.75 (GAUZE/BANDAGES/DRESSINGS) ×3 IMPLANT
DRSG TELFA 4X3 1S NADH ST (GAUZE/BANDAGES/DRESSINGS) ×3 IMPLANT
ELECT REM PT RETURN 9FT ADLT (ELECTROSURGICAL) ×3
ELECTRODE REM PT RTRN 9FT ADLT (ELECTROSURGICAL) ×1 IMPLANT
GLOVE BIO SURGEON STRL SZ7.5 (GLOVE) ×3 IMPLANT
GLOVE BIO SURGEON STRL SZ8 (GLOVE) ×3 IMPLANT
GOWN STRL REUS W/ TWL LRG LVL3 (GOWN DISPOSABLE) ×2 IMPLANT
GOWN STRL REUS W/ TWL XL LVL3 (GOWN DISPOSABLE) ×1 IMPLANT
GOWN STRL REUS W/TWL LRG LVL3 (GOWN DISPOSABLE) ×4
GOWN STRL REUS W/TWL XL LVL3 (GOWN DISPOSABLE) ×2
IMMOBILIZER SHDR MD LX WHT (SOFTGOODS) ×3 IMPLANT
IMMOBILIZER SHDR XL LX WHT (SOFTGOODS) IMPLANT
INTRO PACEMKR SHEATH II 7FR (MISCELLANEOUS) ×6
INTRODUCER PACEMKR SHTH II 7FR (MISCELLANEOUS) ×2 IMPLANT
IPG PACE AZUR XT SR MRI W1SR01 (Pacemaker) ×1 IMPLANT
IV NS 500ML (IV SOLUTION) ×2
IV NS 500ML BAXH (IV SOLUTION) ×1 IMPLANT
KIT RM TURNOVER STRD PROC AR (KITS) ×3 IMPLANT
LABEL OR SOLS (LABEL) ×3 IMPLANT
LEAD CAPSURE NOVUS 5076-52CM (Lead) ×3 IMPLANT
MARKER SKIN DUAL TIP RULER LAB (MISCELLANEOUS) ×3 IMPLANT
PACE AZURE XT SR MRI W1SR01 (Pacemaker) ×3 IMPLANT
PACK PACE INSERTION (MISCELLANEOUS) ×3 IMPLANT
PAD ONESTEP ZOLL R SERIES ADT (MISCELLANEOUS) ×3 IMPLANT
SUT SILK 0 SH 30 (SUTURE) ×9 IMPLANT

## 2016-10-02 NOTE — Anesthesia Post-op Follow-up Note (Signed)
Anesthesia QCDR form completed.        

## 2016-10-02 NOTE — Op Note (Signed)
[facility]   09/29/2016 - 10/02/2016                     1:18 PM  PATIENT:  Laura Cisneros    PRE-OPERATIVE DIAGNOSIS:  XAJOINOMVEH,MCN,OBSJ  POST-OPERATIVE DIAGNOSIS:  Same  PROCEDURE:  INSERTION PACEMAKER  SURGEON:  Isaias Cowman, MD    ANESTHESIA:     PREOPERATIVE INDICATIONS:  [name] is a  83 y.o. female with a diagnosis of BRADYCARDIA,SSS,AFIB who failed conservative measures and elected for surgical management.    The risks benefits and alternatives were discussed with the patient preoperatively including but not limited to the risks of infection, bleeding, cardiopulmonary complications, the need for revision surgery, among others, and the patient was willing to proceed.   OPERATIVE PROCEDURE: The patient was brought to the operating room the fasting state. The right pectoral region was prepped and draped in usual sterile manner. Anesthesia was obtained 1% lidocaine locally. A 6 cm incision was performed over the right pectoral region. The pacemaker pocket was generated by electrocautery and blunt dissection. Access was obtained to the right subclavian vein by fine needle aspiration. MRI compatible lead (Medtronic [id number] ) was positioned to right ventricular apical septum. After proper thresholds were obtained the lead was sutured in place. The lead was connected to a MRI compatible single-chamber rate responsive pacemaker generator (Medtronic [id number]). The pacemaker generator was positioned into the pocket and the pocket was closed with 2-0 and 4-0 Vicryl, respectively. Steri-Strips and a pressure dressing were applied.

## 2016-10-02 NOTE — Progress Notes (Signed)
Pharmacy Antibiotic Note  Laura Cisneros is a 83 y.o. female admitted on 09/29/2016 with UTI.  Pharmacy has been consulted for ciprofloxacin dosing.  Plan: Will initiate ciprofloxacin 250mg  PO Q12hr.   Height: 5' (152.4 cm) Weight: 141 lb 4.8 oz (64.1 kg) IBW/kg (Calculated) : 45.5  Temp (24hrs), Avg:97.6 F (36.4 C), Min:97.3 F (36.3 C), Max:98 F (36.7 C)   Recent Labs Lab 09/29/16 1026 09/30/16 0555 10/01/16 0427 10/02/16 0413  WBC 10.3 8.4 7.1 7.1  CREATININE 0.85 0.74 0.67 0.61    Estimated Creatinine Clearance: 39.8 mL/min (by C-G formula based on SCr of 0.61 mg/dL).    No Known Allergies  Antimicrobials this admission: Ceftriaxone 9/3 >> 9/4 Ciprofloxacin 9/5 >> 9/7  Dose adjustments this admission: N/A  Microbiology results: 9/2 UCx: Laura Cisneros   Thank you for allowing pharmacy to be a part of this patient's care.  Laura Cisneros 10/02/2016 9:27 AM

## 2016-10-02 NOTE — Progress Notes (Signed)
Subjective:  Resting comfortably improved mental status on antibiotic therapy family in the room with the patient denies any chest pain no blackout spells or syncope  Objective:  Vital Signs in the last 24 hours: Temp:  [97.3 F (36.3 C)-98.6 F (37 C)] 98.5 F (36.9 C) (09/05 1406) Pulse Rate:  [32-60] 60 (09/05 1406) Resp:  [17-24] 20 (09/05 1406) BP: (150-180)/(61-90) 179/88 (09/05 1406) SpO2:  [94 %-100 %] 95 % (09/05 1406)  Intake/Output from previous day: 09/04 0701 - 09/05 0700 In: 120 [P.O.:120] Out: 750 [Urine:750] Intake/Output from this shift: Total I/O In: 550 [I.V.:550] Out: 2 [Blood:2]  Physical Exam: General appearance: appears stated age Neck: no adenopathy, no carotid bruit, no JVD, supple, symmetrical, trachea midline and thyroid not enlarged, symmetric, no tenderness/mass/nodules Lungs: clear to auscultation bilaterally Heart: irregularly irregular rhythm Abdomen: soft, non-tender; bowel sounds normal; no masses,  no organomegaly Extremities: extremities normal, atraumatic, no cyanosis or edema Pulses: 2+ and symmetric Skin: Skin color, texture, turgor normal. No rashes or lesions Neurologic: Alert and oriented X 3, normal strength and tone. Normal symmetric reflexes. Normal coordination and gait  Patient still has severe bradycardia rates in the 30s  Lab Results:  Recent Labs  10/01/16 0427 10/02/16 0413  WBC 7.1 7.1  HGB 13.0 12.4  PLT 218 229    Recent Labs  10/01/16 0427 10/02/16 0413  NA 137 138  K 4.0 3.7  CL 108 109  CO2 22 22  GLUCOSE 94 101*  BUN 17 15  CREATININE 0.67 0.61   No results for input(s): TROPONINI in the last 72 hours.  Invalid input(s): CK, MB Hepatic Function Panel No results for input(s): PROT, ALBUMIN, AST, ALT, ALKPHOS, BILITOT, BILIDIR, IBILI in the last 72 hours. No results for input(s): CHOL in the last 72 hours. No results for input(s): PROTIME in the last 72 hours.  Imaging: Imaging results have been  reviewed  Cardiac Studies:  Assessment/Plan:  atrial fibrillation.   Symptomatic bradycardia UTI Altered mental status secondary to urinary tract infection Parkinson's Hypotension . Plan Continue patient on telemetry Agree with holding Coreg and digoxin No clear indication for temporary pacemaker Continue to hold sotalol to maintain IV heparin for anticoagulation short-term Continue antibiotic therapy for UTI Refer patient for permanent pacemaker heart rate does not improve  LOS: 3 days    Taejah Ohalloran D Alayzha An 10/02/2016, 2:23 PM

## 2016-10-02 NOTE — Anesthesia Procedure Notes (Signed)
Performed by: Demetrius Charity Pre-anesthesia Checklist: Patient identified, Emergency Drugs available, Patient being monitored, Suction available and Timeout performed Patient Re-evaluated:Patient Re-evaluated prior to induction Oxygen Delivery Method: Simple face mask Induction Type: IV induction

## 2016-10-02 NOTE — Anesthesia Preprocedure Evaluation (Signed)
Anesthesia Evaluation  Patient identified by MRN, date of birth, ID band Patient awake    Reviewed: Allergy & Precautions, NPO status , Patient's Chart, lab work & pertinent test results, reviewed documented beta blocker date and time   Airway Mallampati: II  TM Distance: >3 FB     Dental  (+) Chipped   Pulmonary           Cardiovascular hypertension, Pt. on medications + Peripheral Vascular Disease  + dysrhythmias Atrial Fibrillation      Neuro/Psych    GI/Hepatic hiatal hernia,   Endo/Other    Renal/GU      Musculoskeletal  (+) Arthritis ,   Abdominal   Peds  Hematology  (+) anemia ,   Anesthesia Other Findings Hx of PE and DVT.  Reproductive/Obstetrics                             Anesthesia Physical Anesthesia Plan  ASA: III  Anesthesia Plan: MAC   Post-op Pain Management:    Induction:   PONV Risk Score and Plan:   Airway Management Planned:   Additional Equipment:   Intra-op Plan:   Post-operative Plan:   Informed Consent: I have reviewed the patients History and Physical, chart, labs and discussed the procedure including the risks, benefits and alternatives for the proposed anesthesia with the patient or authorized representative who has indicated his/her understanding and acceptance.     Plan Discussed with: CRNA  Anesthesia Plan Comments:         Anesthesia Quick Evaluation

## 2016-10-02 NOTE — Progress Notes (Signed)
ANTICOAGULATION CONSULT NOTE - Initial Consult  Pharmacy Consult for heparin gtt Indication: atrial fibrillation  No Known Allergies  Patient Measurements: Height: 5' (152.4 cm) Weight: 141 lb 4.8 oz (64.1 kg) IBW/kg (Calculated) : 45.5 Heparin Dosing Weight: 59kg  Vital Signs: Temp: 97.6 F (36.4 C) (09/05 0421) Temp Source: Oral (09/05 0421) BP: 150/77 (09/05 0421) Pulse Rate: 32 (09/05 0421)  Labs:  Recent Labs  09/29/16 1026 09/30/16 0555  09/30/16 1117 09/30/16 1957 10/01/16 0427 10/02/16 0413  HGB 12.3 12.0  --   --   --  13.0 12.4  HCT 36.7 36.1  --   --   --  39.5 36.9  PLT 244 212  --   --   --  218 229  APTT  --   --   --  35 128* 81*  --   LABPROT  --   --   --  17.2*  --   --   --   INR  --   --   --  1.42  --   --   --   HEPARINUNFRC  --   --   < > 0.89* 0.70 0.51 0.33  CREATININE 0.85 0.74  --   --   --  0.67 0.61  TROPONINI 0.03*  --   --   --   --   --   --   < > = values in this interval not displayed.  Estimated Creatinine Clearance: 39.8 mL/min (by C-G formula based on SCr of 0.61 mg/dL).   Medical History: Past Medical History:  Diagnosis Date  . A-fib ([place])   . Anemia   . Arthritis    osteoarthritis  . Atrophic vaginitis   . Cancer ([place])    Breast  . Cataract   . Diverticulosis   . DVT (deep vein thrombosis) in pregnancy ([place])   . Hiatal hernia   . Hypertension   . Incontinence   . Osteoporosis   . Parkinson disease ([place])   . PE (pulmonary thromboembolism) ([place])   . Vitamin B12 deficiency     Medications:  Prescriptions Prior to Admission  Medication Sig Dispense Refill Last Dose  . acetaminophen (TYLENOL) 500 MG tablet Take 500 mg by mouth every 6 (six) hours as needed (pain).   PRN at PRN  . carbidopa-levodopa (SINEMET CR) 50-200 MG tablet Take 3 tablets by mouth 3 (three) times daily.    09/29/2016 at 0700  . conjugated estrogens (PREMARIN) vaginal cream Place 1 Applicatorful vaginally daily. Apply 0.5mg  (pea-sized amount)   just inside the vaginal introitus with a finger-tip every night for two weeks and then Monday, Wednesday and Friday nights. (Patient taking differently: Place 1 Applicatorful vaginally once a week. On Wednesday) 30 g 12 09/25/2016  . cyanocobalamin (,VITAMIN B-12,) 1000 MCG/ML injection INJECT 1ML INTO MUSCLE ONCE A MONTH AS DIRECTED   09/26/2016 at 0800  . digoxin (LANOXIN) 0.125 MG tablet Take 1 tablet (0.125 mg total) by mouth daily. 30 tablet 0 09/29/2016 at 0700  . diltiazem (CARDIZEM CD) 120 MG 24 hr capsule Take 1 capsule (120 mg total) by mouth daily. 30 capsule 0 09/28/2016 at 1200  . fesoterodine (TOVIAZ) 4 MG TB24 tablet Take 1 tablet (4 mg total) by mouth daily. 30 tablet 11 09/29/2016 at 0700  . hydroxypropyl methylcellulose / hypromellose (ISOPTO TEARS / GONIOVISC) 2.5 % ophthalmic solution Place 1 drop into both eyes 3 (three) times daily as needed for dry eyes.   PRN at PRN  .  midodrine (PROAMATINE) 2.5 MG tablet Take 2 tablets (5 mg total) by mouth 3 (three) times daily with meals. 90 tablet 0 09/29/2016 at 0700  . XARELTO 20 MG TABS tablet Take 20 mg by mouth daily.   0 09/29/2016 at 0700   Scheduled:  . carbidopa-levodopa  3 tablet Oral TID  . chlorhexidine  60 mL Topical Once  . [START ON 10/07/2016] conjugated estrogens  1 Applicatorful Vaginal Weekly  . feeding supplement (ENSURE ENLIVE)  237 mL Oral TID BM  . fesoterodine  4 mg Oral Daily  . gentamicin irrigation  80 mg Irrigation On Call  . midodrine  5 mg Oral TID WC   Infusions:  . sodium chloride    .  ceFAZolin (ANCEF) IV    . cefTRIAXone (ROCEPHIN)  IV Stopped (10/01/16 1003)  . heparin 800 Units/hr (10/01/16 1155)   PRN: acetaminophen **OR** acetaminophen, atropine, ondansetron **OR** ondansetron (ZOFRAN) IV, polyethylene glycol, polyvinyl alcohol  Assessment: 83 year old female previously on xarelto 20mg  daily (LD 9/2@0700 ) presents with Afib, pharmacy consulted to manage heparin gtt.   Goal of Therapy:  Heparin level  0.3-0.7 units/ml Monitor platelets by anticoagulation protocol: Yes   Plan:  Give 2500 units bolus x 1 Start heparin infusion at 900 units/hr Check anti-Xa level in 8 hours and daily while on heparin Continue to monitor H&H and platelets   9/03 @ 20:00 :  APTT = 128, HL = 0.7 Will decrease heparin drip to 800 units/hr and recheck aPTT 6 hrs after rate change.   Will recheck HL on 9/4 with AM labs.   Laura Cisneros S 10/02/2016,5:06 AM   9/4 0430 aPTT 81, heparin level 0.51. Continue current regimen. Levels appear to be correlating in therapeutic range so will continue to follow and adjust by heparin levels only. Next heparin level and CBC with tomorrow AM labs.  9/5 AM heparin level 0.33. Continue current regimen. Recheck heparin level and CBC with tomorrow AM labs.  Laura Cisneros, PharmD, BCPS  10/02/16 5:06 AM

## 2016-10-02 NOTE — Transfer of Care (Signed)
Immediate Anesthesia Transfer of Care Note  Patient: Laura Cisneros  Procedure(s) Performed: Procedure(s): INSERTION PACEMAKER (Right)  Patient Location: PACU  Anesthesia Type:MAC  Level of Consciousness: awake, alert  and oriented  Airway & Oxygen Therapy: Patient Spontanous Breathing and Patient connected to face mask oxygen  Post-op Assessment: Report given to RN and Post -op Vital signs reviewed and stable  Post vital signs: Reviewed and stable  Last Vitals:  Vitals:   10/02/16 0910 10/02/16 1123  BP: (!) 168/63 (!) 155/81  Pulse: (!) 32 (!) 32  Resp: 19 18  Temp:  (!) 36.4 C  SpO2: 97% 96%    Last Pain:  Vitals:   10/02/16 1123  TempSrc: Tympanic  PainSc:          Complications: No apparent anesthesia complications

## 2016-10-02 NOTE — Progress Notes (Signed)
Subjective:  Patient feels reasonably well now shortness of breath no chest pain lying in bed comfortably  Objective:  Vital Signs in the last 24 hours: Temp:  [97.3 F (36.3 C)-98 F (36.7 C)] 97.5 F (36.4 C) (09/05 1123) Pulse Rate:  [32-34] 32 (09/05 1123) Resp:  [17-19] 18 (09/05 1123) BP: (150-168)/(58-81) 155/81 (09/05 1123) SpO2:  [94 %-97 %] 96 % (09/05 1123)  Intake/Output from previous day: 09/04 0701 - 09/05 0700 In: 120 [P.O.:120] Out: 750 [Urine:750] Intake/Output from this shift: No intake/output data recorded.  Physical Exam: General appearance: appears stated age Neck: no adenopathy, no carotid bruit, no JVD, supple, symmetrical, trachea midline and thyroid not enlarged, symmetric, no tenderness/mass/nodules Lungs: clear to auscultation bilaterally Heart: irregularly irregular rhythm and bradycardic rate Abdomen: soft, non-tender; bowel sounds normal; no masses,  no organomegaly Extremities: extremities normal, atraumatic, no cyanosis or edema Pulses: 2+ and symmetric Skin: Skin color, texture, turgor normal. No rashes or lesions  Lab Results:  Recent Labs  10/01/16 0427 10/02/16 0413  WBC 7.1 7.1  HGB 13.0 12.4  PLT 218 229    Recent Labs  10/01/16 0427 10/02/16 0413  NA 137 138  K 4.0 3.7  CL 108 109  CO2 22 22  GLUCOSE 94 101*  BUN 17 15  CREATININE 0.67 0.61   No results for input(s): TROPONINI in the last 72 hours.  Invalid input(s): CK, MB Hepatic Function Panel No results for input(s): PROT, ALBUMIN, AST, ALT, ALKPHOS, BILITOT, BILIDIR, IBILI in the last 72 hours. No results for input(s): CHOL in the last 72 hours. No results for input(s): PROTIME in the last 72 hours.  Imaging: Imaging results have been reviewed  Cardiac Studies:  Assessment/Plan:  Atrial Fibrillation Palpitations Shortness of Breath  Parkinson's Urinary tract infection Escherichia coli Symptomatic bradycardia Mild orthostatic hypotension Mild acute  metabolic encephalopathy . Plan Continue telemetry Proceed with permanent pacemaker today Continue to hold Xarelto pre op Maintain IV heparin therapy for anticoagulation short-term Continue antibiotic therapy for UTI Permanent pacemaker by Dr. Saralyn Pilar tomorrow single-lead  LOS: 3 days    Azelie Noguera D Tynetta Bachmann 10/02/2016, 1:03 PM

## 2016-10-02 NOTE — Progress Notes (Signed)
[facility] [facility] at [place] NAME: Laura Cisneros    MR#:  [id number]  DATE OF BIRTH:  1925/06/26  SUBJECTIVE:   Patient plan for pacemaker today.  Family reporting that she seems a bit confused this morning  REVIEW OF SYSTEMS:    Review of Systems  Constitutional: Negative for fever, chills weight loss Positive for fatigue and generalized weakness HENT: Negative for ear pain, nosebleeds, congestion, facial swelling, rhinorrhea, neck pain, neck stiffness and ear discharge.   Respiratory: Negative for cough, positive for shortness of breath and no  wheezing  Cardiovascular: Negative for chest pain, palpitations and leg swelling.  Gastrointestinal: Negative for heartburn, abdominal pain, vomiting, diarrhea or consitpation Genitourinary: Negative for dysuria, urgency, frequency, hematuria Musculoskeletal: Negative for back pain or joint pain Neurological: Negative for dizziness, seizures, syncope, focal weakness,  numbness and headaches.  Hematological: Does not bruise/bleed easily.  Psychiatric/Behavioral: Negative for hallucinations, Positive for confusion, no dysphoric mood    Tolerating Diet:  npo     DRUG ALLERGIES:  No Known Allergies  VITALS:  Blood pressure (!) 150/77, pulse (!) 32, temperature 97.6 F (36.4 C), temperature source Oral, resp. rate 18, height 5' (1.524 m), weight 64.1 kg (141 lb 4.8 oz), SpO2 94 %.  PHYSICAL EXAMINATION:  Constitutional: Appears well-developed and well-nourished. No distress. HENT: Normocephalic. Marland Kitchen Oropharynx is clear and moist.  Eyes: Conjunctivae and EOM are normal. PERRLA, no scleral icterus.  Neck: Normal ROM. Neck supple. No JVD. No tracheal deviation. CVS: Bradycardia , no murmurs, no gallops, no carotid bruit.  Pulmonary: Effort and breath sounds normal, no stridor, rhonchi, wheezes, rales.  Abdominal: Soft. BS +,  no distension, tenderness, rebound or guarding.  Musculoskeletal: Normal range of  motion. No edema and no tenderness.  Neuro: Alert. CN 2-12 grossly intact. No focal deficits. Skin: Skin is warm and dry. No rash noted. Psychiatric: A bit confused at times     LABORATORY PANEL:   CBC  Recent Labs Lab 10/02/16 0413  WBC 7.1  HGB 12.4  HCT 36.9  PLT 229   ------------------------------------------------------------------------------------------------------------------  Chemistries   Recent Labs Lab 10/02/16 0413  NA 138  K 3.7  CL 109  CO2 22  GLUCOSE 101*  BUN 15  CREATININE 0.61  CALCIUM 8.1*   ------------------------------------------------------------------------------------------------------------------  Cardiac Enzymes  Recent Labs Lab 09/29/16 1026  TROPONINI 0.03*   ------------------------------------------------------------------------------------------------------------------  RADIOLOGY:  No results found.   ASSESSMENT AND PLAN:    83 year old female with chronic atrial fibrillation on anticoagulation and Parkinson's disease who presented with altered mental status and found to have significant bradycardia.  1. Symptomatic bradycardia with fatigue, shortness of breath and generalized weakness: Rate limiting medications Have been discontinued, despite this heart rate remains low.  Plan for pacemaker creatinine today  2. Slow atrial fibrillation: Heart rate medications are on hold due to problem #1 Continue heparin drip Transitioned back to oral anticoagulation in a.m. after pacemaker placed.   3. Acute metabolic encephalopathy in the setting of UTI And now with some mild delirium from being in the hospital for a prolonged period of time.  4. Escherichia coli Urinary tract infection: Change Rocephin to ciprofloxacin and treat for 3 more days.  5. Orthostatic hypotension on midodrine  6 Parkinson's disease: Continue Sinemet   Management plans discussed with the patient and family and they are in agreement.  CODE  STATUS: FULL  TOTAL TIME TAKING CARE OF THIS PATIENT: 24 minutes.   Physical therapy consultation in  a.m. for disposition planning after pacemaker placed  POSSIBLE D/C tomorrow DEPENDING ON CLINICAL CONDITION.   Keighley Deckman M.D on 10/02/2016 at 8:17 AM  Between 7am to 6pm - Pager - [phone] After 6pm go to [email or web address] - password [email or web address] [facility]  Office  [phone]  CC: Primary care physician; Leonel Ramsay, MD  Note: This dictation was prepared with Dragon dictation along with smaller phrase technology. Any transcriptional errors that result from this process are unintentional.

## 2016-10-03 LAB — CBC
HCT: 37.7 % (ref 35.0–47.0)
HEMOGLOBIN: 12.4 g/dL (ref 12.0–16.0)
MCH: 31.3 pg (ref 26.0–34.0)
MCHC: 32.9 g/dL (ref 32.0–36.0)
MCV: 95.1 fL (ref 80.0–100.0)
Platelets: 240 10*3/uL (ref 150–440)
RBC: 3.96 MIL/uL (ref 3.80–5.20)
RDW: 14.6 % — AB (ref 11.5–14.5)
WBC: 12.7 10*3/uL — ABNORMAL HIGH (ref 3.6–11.0)

## 2016-10-03 LAB — HEPARIN LEVEL (UNFRACTIONATED)

## 2016-10-03 MED ORDER — IPRATROPIUM-ALBUTEROL 0.5-2.5 (3) MG/3ML IN SOLN
RESPIRATORY_TRACT | Status: AC
  Administered 2016-10-03: 3 mL via RESPIRATORY_TRACT
  Filled 2016-10-03: qty 3

## 2016-10-03 MED ORDER — FUROSEMIDE 10 MG/ML IJ SOLN
20.0000 mg | Freq: Two times a day (BID) | INTRAMUSCULAR | Status: DC
  Administered 2016-10-03 – 2016-10-04 (×3): 20 mg via INTRAVENOUS
  Filled 2016-10-03 (×3): qty 2

## 2016-10-03 MED ORDER — IPRATROPIUM-ALBUTEROL 0.5-2.5 (3) MG/3ML IN SOLN
3.0000 mL | RESPIRATORY_TRACT | Status: DC | PRN
  Administered 2016-10-03: 3 mL via RESPIRATORY_TRACT

## 2016-10-03 MED ORDER — ROPINIROLE HCL 1 MG PO TABS
0.5000 mg | ORAL_TABLET | Freq: Every day | ORAL | Status: DC
  Administered 2016-10-03 (×2): 0.5 mg via ORAL
  Filled 2016-10-03 (×2): qty 1

## 2016-10-03 MED ORDER — RIVAROXABAN 20 MG PO TABS
20.0000 mg | ORAL_TABLET | Freq: Every day | ORAL | Status: DC
  Administered 2016-10-03 – 2016-10-04 (×2): 20 mg via ORAL
  Filled 2016-10-03 (×2): qty 1

## 2016-10-03 MED ORDER — CEPHALEXIN 250 MG PO CAPS: 250.0000 mg | ORAL_CAPSULE | Freq: Four times a day (QID) | ORAL | 0 refills | Status: DC

## 2016-10-03 NOTE — Anesthesia Postprocedure Evaluation (Signed)
Anesthesia Post Note  Patient: Laura Cisneros  Procedure(s) Performed: Procedure(s) (LRB): INSERTION PACEMAKER (Right)  Patient location during evaluation: PACU Anesthesia Type: MAC Level of consciousness: awake Pain management: satisfactory to patient Vital Signs Assessment: post-procedure vital signs reviewed and stable Respiratory status: respiratory function stable Cardiovascular status: stable Anesthetic complications: no     Last Vitals:  Vitals:   10/02/16 1937 10/03/16 0406  BP: (!) 162/87 (!) 158/64  Pulse: (!) 57 (!) 59  Resp: 18 19  Temp: 36.6 C 36.6 C  SpO2: 95% 92%    Last Pain:  Vitals:   10/03/16 0406  TempSrc: Oral  PainSc:                  VAN STAVEREN,Shontia Gillooly

## 2016-10-03 NOTE — Progress Notes (Addendum)
Rounded on patient. Pt S/P placement of MRI SURESCAN Pacemaker in right upper chest on 10/02/2016.  Patietn lying in bed with HOB slightly elevated.  Daughter at bedside.  Patient's right upper chest tegaderm dressing dry and intact.  Adult Pacemaker Implantation, Adult Care information sheet from EPIC reviewed with patient and daughter.  Patient Care Manual from manufacturer given and reviewed with patient and daughter.   Wound care, signs of symptoms of infection, activity restrictions, pacemaker care, and when to contact your health care provider.   Patient does not have a cell phone.  Patient's daughter asked pertinent questions.  Patient's daughter wanted to know if patient could be near a microwave. Patient and daughter had no other questions.  Temporary ID Card and Pacemaker Patient Manual given to patient's daughter.  Patient and daughter verbalized understanding.   Teach Back Method used.    Roanna Epley, RN, BSN, [facility] Cardiovascular and Pulmonary Nurse Navigator   11:45 - 12:30

## 2016-10-03 NOTE — Care Management (Signed)
Spoke with patient's daughter and family is now in agreement with short term skilled nursing placement rather than return directly home with home health.  Notified [facility]

## 2016-10-03 NOTE — Care Management Important Message (Signed)
Important Message  Patient Details  Name: Laura Cisneros MRN: [id number] Date of Birth: 01-21-1926   Medicare Important Message Given:  Yes Signed IM notice given   Katrina Stack, RN 10/03/2016, 9:36 AM

## 2016-10-03 NOTE — Progress Notes (Signed)
[place] [facility] [place] NAME: Laura Cisneros    MR#:  [id number]  DATE OF BIRTH:  08/03/1925  SUBJECTIVE:   Patient here due to symptomatic bradycardia and is status post pacemaker placement postoperative day #1. Patient somewhat short of breath this morning and also did not sleep well last night and therefore was a bit confused and agitated this morning but improved when I saw the patient. Family is at bedside.  Patient is presently alert and oriented and denies any complaints presently.  REVIEW OF SYSTEMS:    Review of Systems  Constitutional: Negative for chills and fever.  HENT: Negative for congestion and tinnitus.   Eyes: Negative for blurred vision and double vision.  Respiratory: Positive for shortness of breath. Negative for cough and wheezing.   Cardiovascular: Negative for chest pain, orthopnea and PND.  Gastrointestinal: Negative for abdominal pain, diarrhea, nausea and vomiting.  Genitourinary: Negative for dysuria and hematuria.  Neurological: Positive for weakness (generalized). Negative for dizziness, sensory change and focal weakness.  All other systems reviewed and are negative.   Nutrition: Heart healthy Tolerating Diet: yes Tolerating PT: Eval noted.   DRUG ALLERGIES:  No Known Allergies  VITALS:  Blood pressure (!) 164/73, pulse 65, temperature (!) 97.5 F (36.4 C), temperature source Axillary, resp. rate 19, height 5' (1.524 m), weight 64.1 kg (141 lb 4.8 oz), SpO2 92 %.  PHYSICAL EXAMINATION:   Physical Exam  GENERAL:  83 y.o.-year-old patient lying in bed in mild REsp. Distress.  EYES: Pupils equal, round, reactive to light and accommodation. No scleral icterus. Extraocular muscles intact.  HEENT: Head atraumatic, normocephalic. Oropharynx and nasopharynx clear.  NECK:  Supple, no jugular venous distention. No thyroid enlargement, no tenderness.  LUNGS: Normal breath sounds bilaterally, no wheezing, bibasilar  rales, No rhonchi. + use of accessory muscles of respiration.  CARDIOVASCULAR: S1, S2 normal. No murmurs, rubs, or gallops. Pacemaker site noted with no acute bleeding ABDOMEN: Soft, nontender, nondistended. Bowel sounds present. No organomegaly or mass.  EXTREMITIES: No cyanosis, clubbing or edema b/l.    NEUROLOGIC: Cranial nerves II through XII are intact. No focal Motor or sensory deficits b/l. Globally weak  PSYCHIATRIC: The patient is alert and oriented x 3.  SKIN: No obvious rash, lesion, or ulcer.    LABORATORY PANEL:   CBC  Recent Labs Lab 10/03/16 0413  WBC 12.7*  HGB 12.4  HCT 37.7  PLT 240   ------------------------------------------------------------------------------------------------------------------  Chemistries   Recent Labs Lab 10/02/16 0413  NA 138  K 3.7  CL 109  CO2 22  GLUCOSE 101*  BUN 15  CREATININE 0.61  CALCIUM 8.1*   ------------------------------------------------------------------------------------------------------------------  Cardiac Enzymes  Recent Labs Lab 09/29/16 1026  TROPONINI 0.03*   ------------------------------------------------------------------------------------------------------------------  RADIOLOGY:  Dg Chest Port 1 View  Result Date: 10/02/2016 CLINICAL DATA:  Pacemaker placement EXAM: PORTABLE CHEST 1 VIEW COMPARISON:  09/29/2016 FINDINGS: Interval placement of single lead subclavian pacemaker from the right side. Lead in the region of the right ventricle. No pneumothorax Cardiac enlargement. Progression of bilateral airspace disease most compatible with pulmonary edema. Small bilateral effusions have developed and there is left lower lobe consolidation. IMPRESSION: Satisfactory single lead pacemaker placement without pneumothorax Worsening heart failure with edema and small pleural effusions. Electronically Signed   By: Franchot Gallo M.D.   On: 10/02/2016 14:02   Dg C-arm 1-60 Min-no Report  Result Date:  10/02/2016 Fluoroscopy was utilized by the requesting physician.  No radiographic interpretation.  ASSESSMENT AND PLAN:   83 year old female with past medical history of atrial fibrillation,hypertension, Parkinson's disease, previous history of PE osteoporosis, who presented to the hospital due to weakness and noted to have symptomatic bradycardia.  1.symptomatic bradycardia/sick sinus syndrome-patient is status post pacemaker placement postop day #1. -Presently hemodynamically stable, pacemaker site looks well. -Appreciate cardiology input.  2. Altered mental status - multifactorial related to underlying UTI with sedation from pacemaker placement. - Mental status improved this morning. Continue oral antibiotics with UTI, avoid sedative meds, keep awake during the day and sleep at night and follow mental status.  3. Urinary tract infection-secondary to Escherichia coli. Continue oral ciprofloxacin.  4.CHF-patient's chest x-ray yesterday after pacemaker placement showing some pulmonary edema bilateral pleural effusions. Patient does appear to be somewhat short of breath today. This is acute on chronic diastolic dysfunction. -We'll diurese the patient gently with IV Lasix follow clinically.  5. History of Parkinson's disease-continue Sinemet.  6. History of chronic atrial fibrillation-status post pacemaker yesterday. - resume Xarelto.   7. Restless Leg syndrome - cont. Requip.   8. Hx of Urinary Incontinence - cont. Toviaz.    All the records are reviewed and case discussed with Care Management/Social Worker. Management plans discussed with the patient, family and they are in agreement.  CODE STATUS: Full code  DVT Prophylaxis: Xarelto  TOTAL TIME TAKING CARE OF THIS PATIENT: 30 minutes.   POSSIBLE D/C IN 1-2 DAYS, DEPENDING ON CLINICAL CONDITION.   [name] M.D on 10/03/2016 at 2:55 PM  Between 7am to 6pm - Pager - [phone]  After 6pm go to [email or web address] -  Proofreader  [facility]  [facility]  Office  [phone]  CC: Primary care physician; Leonel Ramsay, MD

## 2016-10-03 NOTE — Progress Notes (Signed)
ANTICOAGULATION CONSULT NOTE - Initial Consult  Pharmacy Consult for heparin gtt Indication: atrial fibrillation  No Known Allergies  Patient Measurements: Height: 5' (152.4 cm) Weight: 141 lb 4.8 oz (64.1 kg) IBW/kg (Calculated) : 45.5 Heparin Dosing Weight: 59kg  Vital Signs: Temp: 97.8 F (36.6 C) (09/06 0406) Temp Source: Oral (09/06 0406) BP: 158/64 (09/06 0406) Pulse Rate: 59 (09/06 0406)  Labs:  Recent Labs  09/30/16 0555  09/30/16 1117 09/30/16 1957 10/01/16 0427 10/02/16 0413 10/03/16 0413  HGB 12.0  --   --   --  13.0 12.4 12.4  HCT 36.1  --   --   --  39.5 36.9 37.7  PLT 212  --   --   --  218 229 240  APTT  --   --  35 128* 81*  --   --   LABPROT  --   --  17.2*  --   --   --   --   INR  --   --  1.42  --   --   --   --   HEPARINUNFRC  --   < > 0.89* 0.70 0.51 0.33 <0.10*  CREATININE 0.74  --   --   --  0.67 0.61  --   < > = values in this interval not displayed.  Estimated Creatinine Clearance: 39.8 mL/min (by C-G formula based on SCr of 0.61 mg/dL).   Medical History: Past Medical History:  Diagnosis Date  . A-fib ([place])   . Anemia   . Arthritis    osteoarthritis  . Atrophic vaginitis   . Cancer ([place])    Breast  . Cataract   . Diverticulosis   . DVT (deep vein thrombosis) in pregnancy ([place])   . Hiatal hernia   . Hypertension   . Incontinence   . Osteoporosis   . Parkinson disease ([place])   . PE (pulmonary thromboembolism) ([place])   . Vitamin B12 deficiency     Medications:  Prescriptions Prior to Admission  Medication Sig Dispense Refill Last Dose  . acetaminophen (TYLENOL) 500 MG tablet Take 500 mg by mouth every 6 (six) hours as needed (pain).   PRN at PRN  . carbidopa-levodopa (SINEMET CR) 50-200 MG tablet Take 3 tablets by mouth 3 (three) times daily.    09/29/2016 at 0700  . conjugated estrogens (PREMARIN) vaginal cream Place 1 Applicatorful vaginally daily. Apply 0.5mg  (pea-sized amount)  just inside the vaginal introitus with a  finger-tip every night for two weeks and then Monday, Wednesday and Friday nights. (Patient taking differently: Place 1 Applicatorful vaginally once a week. On Wednesday) 30 g 12 09/25/2016  . cyanocobalamin (,VITAMIN B-12,) 1000 MCG/ML injection INJECT 1ML INTO MUSCLE ONCE A MONTH AS DIRECTED   09/26/2016 at 0800  . digoxin (LANOXIN) 0.125 MG tablet Take 1 tablet (0.125 mg total) by mouth daily. 30 tablet 0 09/29/2016 at 0700  . diltiazem (CARDIZEM CD) 120 MG 24 hr capsule Take 1 capsule (120 mg total) by mouth daily. 30 capsule 0 09/28/2016 at 1200  . fesoterodine (TOVIAZ) 4 MG TB24 tablet Take 1 tablet (4 mg total) by mouth daily. 30 tablet 11 09/29/2016 at 0700  . hydroxypropyl methylcellulose / hypromellose (ISOPTO TEARS / GONIOVISC) 2.5 % ophthalmic solution Place 1 drop into both eyes 3 (three) times daily as needed for dry eyes.   PRN at PRN  . midodrine (PROAMATINE) 2.5 MG tablet Take 2 tablets (5 mg total) by mouth 3 (three) times daily with meals.  90 tablet 0 09/29/2016 at 0700  . XARELTO 20 MG TABS tablet Take 20 mg by mouth daily.   0 09/29/2016 at 0700   Scheduled:  . amLODipine  10 mg Oral Daily  . carbidopa-levodopa  3 tablet Oral TID  . ciprofloxacin  250 mg Oral BID  . [START ON 10/07/2016] conjugated estrogens  1 Applicatorful Vaginal Weekly  . feeding supplement (ENSURE ENLIVE)  237 mL Oral TID BM  . fesoterodine  4 mg Oral Daily  . midodrine  5 mg Oral TID WC  . rOPINIRole  0.5 mg Oral QHS   Infusions:  . sodium chloride 50 mL/hr at 10/02/16 1136  .  ceFAZolin (ANCEF) IV 1 g (10/03/16 0510)  . heparin Stopped (10/02/16 1110)   PRN: acetaminophen **OR** acetaminophen, acetaminophen, atropine, ipratropium-albuterol, ondansetron **OR** ondansetron (ZOFRAN) IV, polyethylene glycol, polyvinyl alcohol  Assessment: 83 year old female previously on xarelto 20mg  daily (LD 9/2@0700 ) presents with Afib, pharmacy consulted to manage heparin gtt.   Goal of Therapy:  Heparin level 0.3-0.7  units/ml Monitor platelets by anticoagulation protocol: Yes   Plan:  Give 2500 units bolus x 1 Start heparin infusion at 900 units/hr Check anti-Xa level in 8 hours and daily while on heparin Continue to monitor H&H and platelets   9/03 @ 20:00 :  APTT = 128, HL = 0.7 Will decrease heparin drip to 800 units/hr and recheck aPTT 6 hrs after rate change.   Will recheck HL on 9/4 with AM labs.   Laura Cisneros S 10/03/2016,5:24 AM   9/4 0430 aPTT 81, heparin level 0.51. Continue current regimen. Levels appear to be correlating in therapeutic range so will continue to follow and adjust by heparin levels only. Next heparin level and CBC with tomorrow AM labs.  9/5 AM heparin level 0.33. Continue current regimen. Recheck heparin level and CBC with tomorrow AM labs.  9/6 AM heparin level <0.1. Drip off for pacemaker placement.   Sim Boast, PharmD, BCPS  10/03/16 5:24 AM

## 2016-10-03 NOTE — Clinical Social Work Note (Signed)
CSW received referral that patient's family is now considering SNF placement for short term rehab.  CSW will meet with patient and family to discuss SNF placement options.  CSW to continue to follow patient's progress throughout discharge planning.  Stagner Broom. [place], MSW, [place]  10/03/2016 5:24 PM

## 2016-10-03 NOTE — Evaluation (Signed)
Physical Therapy Evaluation Patient Details Name: Laura Cisneros MRN: [id number] DOB: December 04, 1925 Today's Date: 10/03/2016   History of Present Illness  prsented to ER secondary to AMS; admitted with metabolic encephalopathy related to acute cystitis/UTI and symptomatic, junctional bradycardia.  Now status post PPM (9/5).  Clinical Impression  Upon evaluation, patient alert and oriented to basic information; moderate confusion and STM deficits noted intermittently throughout session. Constant cuing for recall and adherence to PPM throughout session, as patient with multiple attempts to raise/push through R UE with movement transitions.  Patient globally weak and deconditioned, requiring mod assist for bed mobility; min assist for unsupported sitting balance; mod assist for sit/stand with RW.  Tolerates static stance only 5 seconds prior to need for seated rest period.  Unsafe/unable to tolerate additional OOB or gait attempts at this time.  Question ability to safely/effectively negotiate home environment upon discharge due to significant cardiopulmonary endurance deficits; will requiring significant increase in level of (physical) assist required at this time. Would benefit from skilled PT to address above deficits and promote optimal return to PLOF; recommend transition to STR upon discharge from acute hospitalization.     Follow Up Recommendations SNF    Equipment Recommendations       Recommendations for Other Services       Precautions / Restrictions Precautions Precautions: Fall;ICD/Pacemaker Restrictions Weight Bearing Restrictions: No      Mobility  Bed Mobility Overal bed mobility: Needs Assistance Bed Mobility: Supine to Sit;Sit to Supine     Supine to sit: Mod assist Sit to supine: Mod assist   General bed mobility comments: constant cuing for adherence to PPM precautions  Transfers Overall transfer level: Needs assistance Equipment used: Rolling walker (2  wheeled) Transfers: Sit to/from Stand Sit to Stand: Mod assist            Ambulation/Gait             General Gait Details: unsafe/unable to tolerate  Stairs            Wheelchair Mobility    Modified Rankin (Stroke Patients Only)       Balance Overall balance assessment: Needs assistance Sitting-balance support: No upper extremity supported;Feet supported Sitting balance-Leahy Scale: Fair     Standing balance support: Bilateral upper extremity supported Standing balance-Leahy Scale: Poor                               Pertinent Vitals/Pain Pain Assessment: No/denies pain    Home Living Family/patient expects to be discharged to:: Private residence Living Arrangements: Children Available Help at Discharge: Family;Personal care attendant;Available 24 hours/day Type of Home: House Home Access: Ramped entrance     Home Layout: One level Home Equipment: Walker - 2 wheels      Prior Function           Comments: Assist from family and aide (5 days/week, 3 hours/day) for ADLs and household activities; sup with RW for limited household mobilization; utilizes manual WC for longer distance, community mobiltiy.  No home O2.  Denies recent fall history.     Hand Dominance        Extremity/Trunk Assessment   Upper Extremity Assessment Upper Extremity Assessment:  (R UE limited due to PPM)    Lower Extremity Assessment Lower Extremity Assessment: Generalized weakness (grossly at least 3+/5 throughout bilat LE)       Communication   Communication: No difficulties  Cognition Arousal/Alertness: Awake/alert  Behavior During Therapy: WFL for tasks assessed/performed Overall Cognitive Status: Difficult to assess                                 General Comments: decreased STM (frequent cuing for recall/adherence to PPM precautions), poor insight/awareness      General Comments      Exercises Other Exercises Other  Exercises: Educated in PPM precautions; patient/daughter voiced understanding, but requires constant cuing for adherence during functional tasks. Other Exercises: Unsupported sitting, worked on postural control and accommodation to upright.  Mild dizziness with initial transition to upright, resolved with 2-3 min in seated position.  Moderately SOB with minimal exertion, requrinig significant rest periods with all activity.  Sats remain >92% on 2L   Assessment/Plan    PT Assessment Patient needs continued PT services  PT Problem List Decreased strength;Decreased activity tolerance;Decreased balance;Decreased range of motion;Decreased mobility;Decreased coordination;Decreased cognition;Decreased knowledge of use of DME;Decreased safety awareness;Cardiopulmonary status limiting activity       PT Treatment Interventions DME instruction;Gait training;Functional mobility training;Therapeutic activities;Therapeutic exercise;Balance training;Patient/family education    PT Goals (Current goals can be found in the Care Plan section)  Acute Rehab PT Goals Patient Stated Goal: to return home PT Goal Formulation: With patient/family Time For Goal Achievement: 10/17/16 Potential to Achieve Goals: Fair    Frequency Min 2X/week   Barriers to discharge        Co-evaluation               AM-PAC PT "6 Clicks" Daily Activity  Outcome Measure Difficulty turning over in bed (including adjusting bedclothes, sheets and blankets)?: Unable Difficulty moving from lying on back to sitting on the side of the bed? : Unable Difficulty sitting down on and standing up from a chair with arms (e.g., wheelchair, bedside commode, etc,.)?: Unable Help needed moving to and from a bed to chair (including a wheelchair)?: Total Help needed walking in hospital room?: Total Help needed climbing 3-5 steps with a railing? : Total 6 Click Score: 6    End of Session Equipment Utilized During Treatment: Oxygen Activity  Tolerance: Patient limited by fatigue Patient left: in bed;with bed alarm set;with family/visitor present;with call bell/phone within reach Nurse Communication: Mobility status PT Visit Diagnosis: Difficulty in walking, not elsewhere classified (R26.2);Muscle weakness (generalized) (M62.81)    Time: 4403-4742 PT Time Calculation (min) (ACUTE ONLY): 83 min   Charges:   PT Evaluation $PT Eval Moderate Complexity: 1 Mod PT Treatments $Therapeutic Activity: 8-22 mins   PT G Codes:   PT G-Codes **NOT FOR INPATIENT CLASS** Functional Assessment Tool Used: AM-PAC 6 Clicks Basic Mobility Functional Limitation: Mobility: Walking and moving around Mobility: Walking and Moving Around Current Status (V9563): At least 40 percent but less than 60 percent impaired, limited or restricted Mobility: Walking and Moving Around Goal Status [phone]): At least 1 percent but less than 20 percent impaired, limited or restricted    Alicia Ackert H. Owens Shark, PT, DPT, NCS 10/03/16, 11:18 AM [phone]

## 2016-10-04 ENCOUNTER — Encounter: Admission: RE | Admit: 2016-10-04 | Payer: Medicare Other | Source: Ambulatory Visit | Admitting: Internal Medicine

## 2016-10-04 LAB — BASIC METABOLIC PANEL
Anion gap: 9 (ref 5–15)
BUN: 18 mg/dL (ref 6–20)
CALCIUM: 8.3 mg/dL — AB (ref 8.9–10.3)
CHLORIDE: 108 mmol/L (ref 101–111)
CO2: 25 mmol/L (ref 22–32)
CREATININE: 0.72 mg/dL (ref 0.44–1.00)
GFR calc Af Amer: >60 mL/min (ref >60)
GFR calc non Af Amer: >60 mL/min (ref >60)
Glucose, Bld: 118 mg/dL — ABNORMAL HIGH (ref 65–99)
Potassium: 3.6 mmol/L (ref 3.5–5.1)
SODIUM: 142 mmol/L (ref 135–145)

## 2016-10-04 LAB — CBC
HCT: 36.8 % (ref 35.0–47.0)
HEMOGLOBIN: 12.2 g/dL (ref 12.0–16.0)
MCH: 31.7 pg (ref 26.0–34.0)
MCHC: 33.1 g/dL (ref 32.0–36.0)
MCV: 95.7 fL (ref 80.0–100.0)
Platelets: 220 10*3/uL (ref 150–440)
RBC: 3.85 MIL/uL (ref 3.80–5.20)
RDW: 14.6 % — AB (ref 11.5–14.5)
WBC: 11.9 10*3/uL — ABNORMAL HIGH (ref 3.6–11.0)

## 2016-10-04 MED ORDER — FUROSEMIDE 20 MG PO TABS: 20.0000 mg | ORAL_TABLET | Freq: Every day | ORAL | Status: DC

## 2016-10-04 MED ORDER — ROPINIROLE HCL 0.5 MG PO TABS: 0.5000 mg | ORAL_TABLET | Freq: Every day | ORAL | Status: AC

## 2016-10-04 MED ORDER — CEPHALEXIN 250 MG PO CAPS: 250.0000 mg | ORAL_CAPSULE | Freq: Four times a day (QID) | ORAL | 0 refills | Status: AC

## 2016-10-04 MED ORDER — AMLODIPINE BESYLATE 10 MG PO TABS
10.0000 mg | ORAL_TABLET | Freq: Every day | ORAL | Status: DC
Start: 2016-10-05 — End: 2016-11-12

## 2016-10-04 NOTE — Progress Notes (Signed)
Pnt had uneventful evening tonight. No issues or concerns. Dressing to right shoulder clean and dry, no new drainage noted at this time. Pnt son at bedside supportive. Bed low, locked and call bell in reach. Will continue to monitor and assess.

## 2016-10-04 NOTE — Clinical Social Work Note (Signed)
CSW presented bed offers to patient and her family and they chose [facility].  Patient to be d/c'ed today to [facility].  Patient and family agreeable to plans will transport via ems RN to call report to room [facility], [phone].  Evette Cristal, MSW, [place]

## 2016-10-04 NOTE — Clinical Social Work Placement (Signed)
   CLINICAL SOCIAL WORK PLACEMENT  NOTE  Date:  10/04/2016  Patient Details  Name: Laura Cisneros MRN: [id number] Date of Birth: 08-Jul-1925  Clinical Social Work is seeking post-discharge placement for this patient at the [place] level of care (*CSW will initial, date and re-position this form in  chart as items are completed):  Yes   Patient/family provided with [place] Work Department's list of facilities offering this level of care within the geographic area requested by the patient (or if unable, by the patient's family).  Yes   Patient/family informed of their freedom to choose among providers that offer the needed level of care, that participate in Medicare, Medicaid or managed care program needed by the patient, have an available bed and are willing to accept the patient.  Yes   Patient/family informed of [facility]'s ownership interest in [facility] and [facility], as well as of the fact that they are under no obligation to receive care at these facilities.  PASRR submitted to EDS on 10/04/16     PASRR number received on 10/04/16     Existing PASRR number confirmed on       FL2 transmitted to all facilities in geographic area requested by pt/family on 10/04/16     FL2 transmitted to all facilities within larger geographic area on       Patient informed that his/her managed care company has contracts with or will negotiate with certain facilities, including the following:        Yes   Patient/family informed of bed offers received.  Patient chooses bed at [facility]     Physician recommends and patient chooses bed at      Patient to be transferred to [facility] on 10/04/16.  Patient to be transferred to facility by [facility]     Patient family notified on 10/04/16 of transfer.  Name of family member notified:  Son Coralyn Mark was at bedside.     PHYSICIAN Please sign FL2     Additional Comment:     _______________________________________________ Ross Ludwig, LCSWA 10/04/2016, 4:54 PM

## 2016-10-04 NOTE — Progress Notes (Signed)
Patient report called to [facility]. Voiced no questions or concerns during report. Called for transport and they stated it may be a while. Reviewed information with patient and family. Voiced no questions or concerns. Will continue to monitor.

## 2016-10-04 NOTE — Discharge Summary (Signed)
[facility] [place] NAME: Laura Cisneros    MR#:  [id number]  DATE OF BIRTH:  02-18-1925  DATE OF ADMISSION:  09/29/2016 ADMITTING PHYSICIAN: Nicholes Mango, MD  DATE OF DISCHARGE: 10/04/2016  PRIMARY CARE PHYSICIAN: Leonel Ramsay, MD    ADMISSION DIAGNOSIS:  Lower urinary tract infectious disease [N39.0] Bradycardia [R00.1] Confusion [R41.0] Weakness [R53.1] Junctional (nodal) bradycardia [R00.1]  DISCHARGE DIAGNOSIS:  Active Problems:   Altered mental status   Junctional (nodal) bradycardia   SECONDARY DIAGNOSIS:   Past Medical History:  Diagnosis Date  . A-fib ([place])   . Anemia   . Arthritis    osteoarthritis  . Atrophic vaginitis   . Cancer ([place])    Breast  . Cataract   . Diverticulosis   . DVT (deep vein thrombosis) in pregnancy ([place])   . Hiatal hernia   . Hypertension   . Incontinence   . Osteoporosis   . Parkinson disease ([place])   . PE (pulmonary thromboembolism) ([place])   . Vitamin B12 deficiency     HOSPITAL COURSE:   83 year old female with past medical history of atrial fibrillation,hypertension, Parkinson's disease, previous history of PE osteoporosis, who presented to the hospital due to weakness and noted to have symptomatic bradycardia.  1.symptomatic bradycardia/sick sinus syndrome- pt. Was admitted to hospital and seen by Cardiology.  She underwent pacemaker placement and presently is postop day # 2.  - cont. Follow up with Cardiology as outpatient.  Pt. Has been taken off her Digoxin, Cardizem for now and this can be further addressed with Cardiology as outpatient.   2. Altered mental status - multifactorial related to underlying UTI with sedation from pacemaker placement. - Mental status much improved and back to baseline now.  - will finish treatment of UTI with Oral Abx with Keflex.   3. Urinary tract infection-secondary to Escherichia coli.  - was treated with Oral Cipro and now being discharged on  Oral Keflex.    4.CHF-patient's chest x-ray after pacemaker placement showed some pulmonary edema bilateral pleural effusions. Pt. Was given IV lasix and responded well to it and has improved.  - she is not being discharged on oral lasix daily.  This was acute on chronic Diastolic dysfunction.    5. History of Parkinson's disease- pt. Will continue Sinemet.  6. History of chronic atrial fibrillation-status post pacemaker and currently rate controlled and in NSR.  - Off Digoxin and Cardizem for now.  Cont. Xarelto. Follow up with Cardiology as outpatient.   7. Restless Leg syndrome - she will cont. cont. Requip.    DISCHARGE CONDITIONS:   Stable.   CONSULTS OBTAINED:  Treatment Team:  Yolonda Kida, MD  DRUG ALLERGIES:  No Known Allergies  DISCHARGE MEDICATIONS:   Allergies as of 10/04/2016   No Known Allergies     Medication List    STOP taking these medications   digoxin 0.125 MG tablet Commonly known as:  LANOXIN   diltiazem 120 MG 24 hr capsule Commonly known as:  CARDIZEM CD   midodrine 2.5 MG tablet Commonly known as:  PROAMATINE     TAKE these medications   acetaminophen 500 MG tablet Commonly known as:  TYLENOL Take 500 mg by mouth every 6 (six) hours as needed (pain).   amLODipine 10 MG tablet Commonly known as:  NORVASC Take 1 tablet (10 mg total) by mouth daily.   carbidopa-levodopa 50-200 MG tablet Commonly known as:  SINEMET CR Take 3 tablets by  mouth 3 (three) times daily.   cephALEXin 250 MG capsule Commonly known as:  KEFLEX Take 1 capsule (250 mg total) by mouth 4 (four) times daily.   conjugated estrogens vaginal cream Commonly known as:  PREMARIN Place 1 Applicatorful vaginally daily. Apply 0.5mg  (pea-sized amount)  just inside the vaginal introitus with a finger-tip every night for two weeks and then Monday, Wednesday and Friday nights. What changed:  when to take this  additional instructions   cyanocobalamin 1000 MCG/ML  injection Commonly known as:  (VITAMIN B-12) INJECT 1ML INTO MUSCLE ONCE A MONTH AS DIRECTED   fesoterodine 4 MG Tb24 tablet Commonly known as:  TOVIAZ Take 1 tablet (4 mg total) by mouth daily.   furosemide 20 MG tablet Commonly known as:  LASIX Take 1 tablet (20 mg total) by mouth daily.   hydroxypropyl methylcellulose / hypromellose 2.5 % ophthalmic solution Commonly known as:  ISOPTO TEARS / GONIOVISC Place 1 drop into both eyes 3 (three) times daily as needed for dry eyes.   rOPINIRole 0.5 MG tablet Commonly known as:  REQUIP Take 1 tablet (0.5 mg total) by mouth at bedtime.   XARELTO 20 MG Tabs tablet Generic drug:  rivaroxaban Take 20 mg by mouth daily.            Discharge Care Instructions        Start     Ordered   10/05/16 0000  cephALEXin (KEFLEX) 250 MG capsule  4 times daily     10/04/16 1206   10/05/16 0000  amLODipine (NORVASC) 10 MG tablet  Daily     10/04/16 1209   10/04/16 0000  rOPINIRole (REQUIP) 0.5 MG tablet  Daily at bedtime     10/04/16 1206   10/04/16 0000  furosemide (LASIX) 20 MG tablet  Daily     10/04/16 1206   10/04/16 0000  Activity as tolerated - No restrictions     10/04/16 1206   10/04/16 0000  Diet general     10/04/16 1206        DISCHARGE INSTRUCTIONS:   DIET:  Regular diet  DISCHARGE CONDITION:  Stable  ACTIVITY:  Activity as tolerated  OXYGEN:  Home Oxygen: No.   Oxygen Delivery: room air  DISCHARGE LOCATION:  nursing home   If you experience worsening of your admission symptoms, develop shortness of breath, life threatening emergency, suicidal or homicidal thoughts you must seek medical attention immediately by calling 911 or calling your MD immediately  if symptoms less severe.  You Must read complete instructions/literature along with all the possible adverse reactions/side effects for all the Medicines you take and that have been prescribed to you. Take any new Medicines after you have completely  understood and accpet all the possible adverse reactions/side effects.   Please note  You were cared for by a hospitalist during your hospital stay. If you have any questions about your discharge medications or the care you received while you were in the hospital after you are discharged, you can call the unit and asked to speak with the hospitalist on call if the hospitalist that took care of you is not available. Once you are discharged, your primary care physician will handle any further medical issues. Please note that NO REFILLS for any discharge medications will be authorized once you are discharged, as it is imperative that you return to your primary care physician (or establish a relationship with a primary care physician if you do not have one) for your aftercare  needs so that they can reassess your need for medications and monitor your lab values.     Today   Mental status much improved. Resp. Status stable. Will d/c to SNF today.    VITAL SIGNS:  Blood pressure (!) 144/70, pulse (!) 58, temperature 98 F (36.7 C), temperature source Oral, resp. rate 19, height 5' (1.524 m), weight 63.7 kg (140 lb 8 oz), SpO2 92 %.  I/O:   Intake/Output Summary (Last 24 hours) at 10/04/16 1209 Last data filed at 10/04/16 0451  Gross per 24 hour  Intake                0 ml  Output              500 ml  Net             -500 ml    PHYSICAL EXAMINATION:   GENERAL:  83 y.o.-year-old patient lying in bed in NAD.  EYES: Pupils equal, round, reactive to light and accommodation. No scleral icterus. Extraocular muscles intact.  HEENT: Head atraumatic, normocephalic. Oropharynx and nasopharynx clear.  NECK:  Supple, no jugular venous distention. No thyroid enlargement, no tenderness.  LUNGS: Normal breath sounds bilaterally, no wheezing, faint bibasilar rales, No rhonchi. (-) use of accessory muscles of respiration.  CARDIOVASCULAR: S1, S2 normal. No murmurs, rubs, or gallops. Pacemaker site noted  with no acute bleeding ABDOMEN: Soft, nontender, nondistended. Bowel sounds present. No organomegaly or mass.  EXTREMITIES: No cyanosis, clubbing or edema b/l.    NEUROLOGIC: Cranial nerves II through XII are intact. No focal Motor or sensory deficits b/l. Globally weak  PSYCHIATRIC: The patient is alert and oriented x 3.  SKIN: No obvious rash, lesion, or ulcer.    DATA REVIEW:   CBC  Recent Labs Lab 10/04/16 0416  WBC 11.9*  HGB 12.2  HCT 36.8  PLT 220    Chemistries   Recent Labs Lab 10/04/16 0416  NA 142  K 3.6  CL 108  CO2 25  GLUCOSE 118*  BUN 18  CREATININE 0.72  CALCIUM 8.3*    Cardiac Enzymes  Recent Labs Lab 09/29/16 1026  TROPONINI 0.03*    Microbiology Results  Results for orders placed or performed during the hospital encounter of 09/29/16  Urine Culture     Status: Abnormal   Collection Time: 09/29/16 12:04 PM  Result Value Ref Range Status   Specimen Description URINE, RANDOM  Final   Special Requests NONE  Final   Culture >=100,000 COLONIES/mL ESCHERICHIA COLI (A)  Final   Report Status 10/02/2016 FINAL  Final   Organism ID, Bacteria ESCHERICHIA COLI (A)  Final      Susceptibility   Escherichia coli - MIC*    AMPICILLIN 16 INTERMEDIATE Intermediate     CEFAZOLIN <=4 SENSITIVE Sensitive     CEFTRIAXONE <=1 SENSITIVE Sensitive     CIPROFLOXACIN <=0.25 SENSITIVE Sensitive     GENTAMICIN <=1 SENSITIVE Sensitive     IMIPENEM <=0.25 SENSITIVE Sensitive     NITROFURANTOIN <=16 SENSITIVE Sensitive     TRIMETH/SULFA <=20 SENSITIVE Sensitive     AMPICILLIN/SULBACTAM 4 SENSITIVE Sensitive     PIP/TAZO <=4 SENSITIVE Sensitive     Extended ESBL NEGATIVE Sensitive     * >=100,000 COLONIES/mL ESCHERICHIA COLI    RADIOLOGY:  Dg Chest Port 1 View  Result Date: 10/02/2016 CLINICAL DATA:  Pacemaker placement EXAM: PORTABLE CHEST 1 VIEW COMPARISON:  09/29/2016 FINDINGS: Interval placement of single lead subclavian pacemaker from the  right side.  Lead in the region of the right ventricle. No pneumothorax Cardiac enlargement. Progression of bilateral airspace disease most compatible with pulmonary edema. Small bilateral effusions have developed and there is left lower lobe consolidation. IMPRESSION: Satisfactory single lead pacemaker placement without pneumothorax Worsening heart failure with edema and small pleural effusions. Electronically Signed   By: Franchot Gallo M.D.   On: 10/02/2016 14:02   Dg C-arm 1-60 Min-no Report  Result Date: 10/02/2016 Fluoroscopy was utilized by the requesting physician.  No radiographic interpretation.      Management plans discussed with the patient, family and they are in agreement.  CODE STATUS:     Code Status Orders        Start     Ordered   09/29/16 1446  Full code  Continuous     09/29/16 1445    Code Status History    Date Active Date Inactive Code Status Order ID Comments User Context   06/19/2016 12:27 AM 06/21/2016  4:03 PM Full Code [id number]  Lance Coon, MD Inpatient    Advance Directive Documentation     Most Recent Value  Type of Advance Directive  Healthcare Power of [place], Living will  Pre-existing out of facility DNR order (yellow form or pink MOST form)  -  "MOST" Form in Place?  -      TOTAL TIME TAKING CARE OF THIS PATIENT: 40 minutes.    [name] M.D on 10/04/2016 at 12:09 PM  Between 7am to 6pm - Pager - [phone]  After 6pm go to [email or web address] - Proofreader  [facility]  Office  [phone]  CC: Primary care physician; Leonel Ramsay, MD

## 2016-10-04 NOTE — Clinical Social Work Note (Signed)
Clinical Social Work Assessment  Patient Details  Name: Laura Cisneros MRN: [id number] Date of Birth: [birth year]  Date of referral:  10/04/16               Reason for consult:  Facility Placement                Permission sought to share information with:  Facility Sport and exercise psychologist, Family Supports Permission granted to share information::  Yes, Verbal Permission Granted  Name::     Laura Cisneros, Laura Cisneros [phone]  Laura Cisneros, Laura Cisneros [phone]  [phone] Laura Cisneros, Laura Cisneros [phone]   Agency::  [facility]  Relationship::     Contact Information:     Housing/Transportation Living arrangements for the past 2 months:  Single Family Home Source of Information:  Patient, Adult Children Patient Interpreter Needed:  None Criminal Activity/Legal Involvement Pertinent to Current Situation/Hospitalization:  No - Comment as needed Significant Relationships:  Adult Children Lives with:  Adult Children Do you feel safe going back to the place where you live?  No Need for family participation in patient care:  Yes (Comment)  Care giving concerns:  Patient and family feel she needs some short term rehab before she is able to go home.   Social Worker assessment / plan:  Patient is an 83 year old female who is alert and oriented x4, and lives with her son.  Patient states she has never been to SNF before, CSW explained to patient what to expect and the process of trying to find placement.  CSW explained how insurance will pay for stay at SNF, and what to expect once patient is ready for discharge.  Patient and family did not express any other questions or concerns and gave CSW permission to begin bed search process in [place].   Employment status:  Retired Forensic scientist:  Information systems manager, Medicaid In [place] PT Recommendations:  [place] / Referral to community resources:  [place]  Patient/Family's Response to care:  Patient and family  are agreeable to going to SNF for short term rehab.  Patient/Family's Understanding of and Emotional Response to Diagnosis, Current Treatment, and Prognosis: Patient and family are hopeful that she will not have to be at [facility] for very long.  Emotional Assessment Appearance:  Appears stated age Attitude/Demeanor/Rapport:    Affect (typically observed):  Appropriate, Calm, Pleasant Orientation:  Oriented to Self, Oriented to Place, Oriented to  Time, Oriented to Situation Alcohol / Substance use:  Not Applicable Psych involvement (Current and /or in the community):  No (Comment)  Discharge Needs  Concerns to be addressed:  Care Coordination, Lack of Support Readmission within the last 30 days:  No Current discharge risk:  Lack of support system Barriers to Discharge:  No Barriers Identified   Ross Ludwig, LCSWA 10/04/2016, 5:08 PM

## 2016-10-04 NOTE — NC FL2 (Signed)
[place] LEVEL OF CARE SCREENING TOOL     IDENTIFICATION  Patient Name: Laura Cisneros Birthdate: [birth year] Sex: female Admission Date (Current Location): 09/29/2016  [place] and [place] Number:  Selena Lesser [id number] [place] and Address:  [facility], [address] [place], [place] [address]      Provider Number: [id number]  Attending Physician Name and Address:  [name], MD  Relative Name and Phone Number:  Alexia, Dinger [phone] or Ceri, Mayer [phone]  [phone] or Zenaya, [place] [phone]      Current Level of Care:  Hospital Recommended Level of Care: [place] Prior Approval Number:    Date Approved/Denied:   PASRR Number: [id number]  Discharge Plan: SNF    Current Diagnoses: Patient Active Problem List   Diagnosis Date Noted  . Altered mental status 09/29/2016  . Junctional (nodal) bradycardia 09/29/2016  . Deep vein thrombosis (DVT) of distal vein of right lower extremity ([place]) 09/18/2016  . UTI (urinary tract infection) 06/18/2016  . Parkinson disease ([place]) 06/18/2016  . HTN (hypertension) 06/18/2016  . AF (paroxysmal atrial fibrillation) ([place]) 06/18/2016  . PE (pulmonary thromboembolism) ([place]) 06/18/2016  . Osteoporosis without current pathological fracture 12/04/2015  . Breast cancer of upper-outer quadrant of left female breast ([place]) 11/22/2015    Orientation RESPIRATION BLADDER Height & Weight     Self, Time, Situation, Place  Normal Continent Weight: 141 lb 4.8 oz (64.1 kg) Height:  5' (152.4 cm)  BEHAVIORAL SYMPTOMS/MOOD NEUROLOGICAL BOWEL NUTRITION STATUS      Continent Diet (Regular diet)  AMBULATORY STATUS COMMUNICATION OF NEEDS Skin   Limited Assist Verbally Surgical wounds                       Personal Care Assistance Level of Assistance  Bathing, Feeding, Dressing Bathing Assistance: Limited assistance Feeding assistance: Limited  assistance Dressing Assistance: Limited assistance     Functional Limitations Info  Sight, Hearing, Speech Sight Info: Adequate Hearing Info: Adequate Speech Info: Adequate    SPECIAL CARE FACTORS FREQUENCY  PT (By licensed PT)     PT Frequency: 5x a week              Contractures Contractures Info: Not present    Additional Factors Info  Code Status, Allergies Code Status Info: Full Code Allergies Info: NKA           Current Medications (10/04/2016):  This is the current hospital active medication list Current Facility-Administered Medications  Medication Dose Route Frequency Provider Last Rate Last Dose  . acetaminophen (TYLENOL) tablet 650 mg  650 mg Oral Q6H PRN Gouru, Aruna, MD   650 mg at 09/29/16 1702   Or  . acetaminophen (TYLENOL) suppository 650 mg  650 mg Rectal Q6H PRN Gouru, Aruna, MD      . acetaminophen (TYLENOL) tablet 325-650 mg  325-650 mg Oral Q4H PRN Paraschos, Alexander, MD      . amLODipine (NORVASC) tablet 10 mg  10 mg Oral Daily Bettey Costa, MD   10 mg at 10/04/16 0945  . atropine 1 MG/10ML injection 0.5 mg  0.5 mg Intravenous PRN Gouru, Aruna, MD      . carbidopa-levodopa (SINEMET CR) 50-200 MG per tablet controlled release 3 tablet  3 tablet Oral TID Nicholes Mango, MD   3 tablet at 10/04/16 1135  . ciprofloxacin (CIPRO) tablet 250 mg  250 mg Oral BID Bettey Costa, MD   250 mg at 10/04/16 0945  . [  START ON 10/07/2016] conjugated estrogens (PREMARIN) vaginal cream 1 Applicatorful  1 Applicatorful Vaginal Weekly Gouru, Aruna, MD      . feeding supplement (ENSURE ENLIVE) (ENSURE ENLIVE) liquid 237 mL  237 mL Oral TID BM Benjie Karvonen, Sital, MD   237 mL at 10/04/16 0946  . fesoterodine (TOVIAZ) tablet 4 mg  4 mg Oral Daily Gouru, Aruna, MD   4 mg at 10/04/16 0945  . furosemide (LASIX) injection 20 mg  20 mg Intravenous Q12H [name], MD   20 mg at 10/04/16 0945  . ipratropium-albuterol (DUONEB) 0.5-2.5 (3) MG/3ML nebulizer solution 3 mL  3 mL  Nebulization Q4H PRN Lance Coon, MD   3 mL at 10/03/16 0349  . midodrine (PROAMATINE) tablet 5 mg  5 mg Oral TID WC Gouru, Aruna, MD   5 mg at 10/04/16 1135  . ondansetron (ZOFRAN) tablet 4 mg  4 mg Oral Q6H PRN Gouru, Aruna, MD       Or  . ondansetron (ZOFRAN) injection 4 mg  4 mg Intravenous Q6H PRN Gouru, Aruna, MD   4 mg at 10/03/16 0924  . polyethylene glycol (MIRALAX / GLYCOLAX) packet 17 g  17 g Oral Daily PRN Gouru, Aruna, MD      . polyvinyl alcohol (LIQUIFILM TEARS) 1.4 % ophthalmic solution 1 drop  1 drop Both Eyes TID PRN Gouru, Aruna, MD      . rivaroxaban (XARELTO) tablet 20 mg  20 mg Oral Q supper [name], MD   20 mg at 10/03/16 1731  . rOPINIRole (REQUIP) tablet 0.5 mg  0.5 mg Oral QHS Harrie Foreman, MD   0.5 mg at 10/03/16 2304     Discharge Medications: Please see discharge summary for a list of discharge medications.  Relevant Imaging Results:  Relevant Lab Results:   Additional Information SSN [id number]  Ross Ludwig, [place]

## 2016-10-04 NOTE — Progress Notes (Signed)
Patient on EMS transport with family at bedside. Called [facility] and informed.

## 2016-10-04 NOTE — Progress Notes (Signed)
Nutrition Follow Up Note  DOCUMENTATION CODES:   Not applicable  INTERVENTION:    Ensure Enlive po TID, each supplement provides 350 kcal and 20 grams of protein  Magic cup TID with meals, each supplement provides 290 kcal and 9 grams of protein   MVI w/ Minerals  Liberalize diet   NUTRITION DIAGNOSIS:   Inadequate oral intake related to acute illness as evidenced by per patient/family report.  GOAL:   Patient will meet greater than or equal to 90% of their needs  MONITOR:   PO intake, I & O's, Labs, Supplement acceptance, Weight trends    ASSESSMENT:   Laura Cisneros is an 83 yo female with PMHHx of A-Fib on Xarelto, Parkinson's, Breast CA s/p partial mastectomy, HTN, DVT, presents with UTI, AMS, generalized weakness, symptomatic bradycardia   Pt s/p pacemaker placement 9/5. Met with pt and family in room today. Pt reports continued poor appetite but is drinking 3 Ensure per day. Pt is only eating bites of her meals. Contacted MD to request liberalizing diet. Pt reports that she has not been getting Magic Cups on her trays. Pt remains weight stable. Per MD, pt to discharge today.     Medications reviewed and include: ciprofloxacin, lasix  Labs reviewed: Ca 8.3(L) Wbc- 11.9(H)  Diet Order:  Diet Heart Room service appropriate? Yes with Assist; Fluid consistency: Thin  Skin:  Reviewed, no issues  Last BM:  9/5- Type 5  Height:   Ht Readings from Last 1 Encounters:  09/29/16 5' (1.524 m)    Weight:   Wt Readings from Last 1 Encounters:  09/29/16 141 lb 4.8 oz (64.1 kg)    Ideal Body Weight:  45.45 kg  BMI:  Body mass index is 27.6 kg/m.  Estimated Nutritional Needs:   Kcal:  1250-1500 calories (25-30 cal/kg ABW)  Protein:  76-90 grams  Fluid:  > 1.5L  EDUCATION NEEDS:   No education needs identified at this time  Koleen Distance MS, RD, LDN Pager #- [phone] After Hours Pager: [phone]

## 2016-10-10 ENCOUNTER — Other Ambulatory Visit
Admission: RE | Admit: 2016-10-10 | Discharge: 2016-10-10 | Disposition: A | Discharge status: Home / Self Care | Payer: Medicare Other | Source: Ambulatory Visit | Attending: Internal Medicine | Admitting: Internal Medicine

## 2016-10-10 DIAGNOSIS — I10 Essential (primary) hypertension: Secondary | ICD-10-CM | POA: Insufficient documentation

## 2016-10-10 LAB — BASIC METABOLIC PANEL
Anion gap: 8 (ref 5–15)
BUN: 14 mg/dL (ref 6–20)
CALCIUM: 8.8 mg/dL — AB (ref 8.9–10.3)
CO2: 31 mmol/L (ref 22–32)
Chloride: 100 mmol/L — ABNORMAL LOW (ref 101–111)
Creatinine, Ser: 0.57 mg/dL (ref 0.44–1.00)
Glucose, Bld: 97 mg/dL (ref 65–99)
Potassium: 3.8 mmol/L (ref 3.5–5.1)
Sodium: 139 mmol/L (ref 135–145)

## 2016-10-13 ENCOUNTER — Other Ambulatory Visit
Admission: RE | Admit: 2016-10-13 | Discharge: 2016-10-13 | Disposition: A | Discharge status: Home / Self Care | Payer: Medicare Other | Source: Ambulatory Visit | Attending: Internal Medicine | Admitting: Internal Medicine

## 2016-10-13 DIAGNOSIS — R3915 Urgency of urination: Secondary | ICD-10-CM | POA: Insufficient documentation

## 2016-10-13 DIAGNOSIS — R410 Disorientation, unspecified: Secondary | ICD-10-CM | POA: Insufficient documentation

## 2016-10-13 LAB — URINALYSIS, COMPLETE (UACMP) WITH MICROSCOPIC
BILIRUBIN URINE: NEGATIVE
Glucose, UA: NEGATIVE mg/dL
Hgb urine dipstick: NEGATIVE
KETONES UR: 5 mg/dL — AB
Nitrite: NEGATIVE
PH: 6 (ref 5.0–8.0)
PROTEIN: NEGATIVE mg/dL
Specific Gravity, Urine: 1.013 (ref 1.005–1.030)

## 2016-10-15 ENCOUNTER — Encounter: Payer: Self-pay | Admitting: Gerontology

## 2016-10-15 ENCOUNTER — Non-Acute Institutional Stay (SKILLED_NURSING_FACILITY): Payer: Medicare Other | Admitting: Gerontology

## 2016-10-15 DIAGNOSIS — R531 Weakness: Secondary | ICD-10-CM

## 2016-10-15 DIAGNOSIS — R001 Bradycardia, unspecified: Secondary | ICD-10-CM | POA: Diagnosis not present

## 2016-10-15 DIAGNOSIS — N390 Urinary tract infection, site not specified: Secondary | ICD-10-CM | POA: Diagnosis not present

## 2016-10-15 LAB — URINE CULTURE

## 2016-10-15 NOTE — Progress Notes (Signed)
Location:   [facility] Room Number: 211A Place of Service:  SNF [phone]) Provider:  Toni Arthurs, NP-C  Leonel Ramsay, MD  Patient Care Team: Leonel Ramsay, MD as PCP - General (Infectious Diseases)  Extended Emergency Contact Information Primary Emergency Contact: Pellot,Timothy L Address: [address]          [place], [place] [address] [name] of [place] Phone: [phone] Relation: Son Secondary Emergency Contact: Mis,James Address: [address]          [place], [place] [address] [name] of [place] Phone: [phone] Mobile Phone: [phone] Relation: Son  Code Status:  FULL Goals of care: Advanced Directive information Advanced Directives 10/15/2016  Does Patient Have a Medical Advance Directive? No  Type of Advance Directive -  Does patient want to make changes to medical advance directive? -  Copy of [place] in Chart? -     Chief Complaint  Patient presents with  . Hospitalization Follow-up    Follow up on UTI    HPI:  Pt is a 83 y.o. female seen today for a hospital f/u s/p admission from [facility] for [place]. Pt was found to have a UTI and bradycardia, requiring pacemaker placement. Pt was admitted to the facility for rehab for generalized weakness and deconditioning following the hospitalization. Pt has been participating in PT and OT. She reports continued weakness and dyspnea with exertion. She does report a fast recovery with rest. Pt is on antibiotics for the UTI. Denies ongoing sx of UTI. Pt denies n/v/d/f/c/cp/sob/ha/abd pain/dizziness (except with exertion)/ cough/pain. Pt reports her appetite is fair, but improving. VSS. No other complaints.   Past Medical History:  Diagnosis Date  . A-fib ([place])    unspecified  . Anemia    unspecified  . Arthritis    osteoarthritis  . Atrophic vaginitis   . Bleeding hemorrhoid   . Breast cancer ([place])    unspecified  . Cancer ([place])    Breast  . Cataract   . Diverticulosis   . DVT (deep vein thrombosis) in pregnancy ([place])   . Hiatal hernia   . Hypertension   . Incontinence   . Osteoarthritis   . Osteoporosis    a. Intolerance to Fosamax b. Bilateral foot fractures c. IV Boniva d. Low vitamin D e. Reclast  . Parkinson disease ([place])   . PE (pulmonary thromboembolism) ([place])   . Scleritis    Idiopathic a. Prednisone b. s/p methotrexate  . TIA (transient ischemic attack) 1997   Possible  . Urinary urgency   . Vitamin B12 deficiency    Past Surgical History:  Procedure Laterality Date  . ABDOMINAL HYSTERECTOMY    . ABDOMINAL HYSTERECTOMY     partial  . BLADDER TACK SURGERY     Dr. Ouida Sills  . BREAST BIOPSY Left 10/2012   benign  . BREAST BIOPSY Left 11/22/2015   invasive mammary ca   . BREAST EXCISIONAL BIOPSY Left 12/08/2015   lumpectomy  . EYE SURGERY Bilateral    Catarct Extraction with IOL  . FINGER SURGERY Right   . FOOT FRACTURE SURGERY Right 06/2003   pinning, Dr. Francia Greaves, [facility]  . FOOT SURGERY Left   . INCONTINENCE SURGERY    . NEUROMA SURGERY    . PACEMAKER INSERTION Right 10/02/2016   Procedure: INSERTION PACEMAKER;  Surgeon: Isaias Cowman, MD;  Location: [facility] ORS;  Service: Cardiovascular;  Laterality: Right;  . PARTIAL MASTECTOMY WITH AXILLARY SENTINEL LYMPH NODE BIOPSY  Left 12/08/2015   Procedure: PARTIAL MASTECTOMY WITH AXILLARY SENTINEL LYMPH NODE BIOPSY;  Surgeon: [name], MD;  Location: [facility] ORS;  Service: General;  Laterality: Left;    No Known Allergies  Allergies as of 10/15/2016   No Known Allergies     Medication List       Accurate as of 10/15/16 10:29 AM. Always use your most recent med list.          acetaminophen 500 MG tablet Commonly known as:  TYLENOL Take 500 mg by mouth every 6 (six) hours as needed (pain).   amLODipine 10 MG tablet Commonly known as:  NORVASC Take 1 tablet (10 mg total) by mouth daily.   carbidopa-levodopa 50-200 MG  tablet Commonly known as:  SINEMET CR Take 3 tablets by mouth 3 (three) times daily.   cyanocobalamin 1000 MCG/ML injection Commonly known as:  (VITAMIN B-12) INJECT 1ML INTO MUSCLE ONCE A MONTH AS DIRECTED   DERMACLOUD Crea Apply liberal amount to area of skin irritation topically as needed. Ok to leave at bedside.   fesoterodine 4 MG Tb24 tablet Commonly known as:  TOVIAZ Take 1 tablet (4 mg total) by mouth daily.   furosemide 40 MG tablet Commonly known as:  LASIX Take 40 mg by mouth 2 (two) times daily.   hydroxypropyl methylcellulose / hypromellose 2.5 % ophthalmic solution Commonly known as:  ISOPTO TEARS / GONIOVISC Place 1 drop into both eyes 3 (three) times daily as needed for dry eyes.   Melatonin 3 MG Tabs Take 3 mg by mouth at bedtime.   potassium chloride 10 MEQ tablet Commonly known as:  K-DUR,KLOR-CON Take 10 mEq by mouth daily.   rOPINIRole 0.5 MG tablet Commonly known as:  REQUIP Take 1 tablet (0.5 mg total) by mouth at bedtime.   XARELTO 20 MG Tabs tablet Generic drug:  rivaroxaban Take 20 mg by mouth daily.       Review of Systems  Constitutional: Negative for activity change, appetite change, chills, diaphoresis and fever.  HENT: Negative for congestion, sneezing, sore throat, trouble swallowing and voice change.   Respiratory: Negative for apnea, cough, choking, chest tightness, shortness of breath and wheezing.   Cardiovascular: Negative for chest pain, palpitations and leg swelling.  Gastrointestinal: Negative for abdominal distention, abdominal pain, constipation, diarrhea and nausea.  Genitourinary: Negative for difficulty urinating, dysuria, frequency and urgency.  Musculoskeletal: Positive for arthralgias (typical arthritis). Negative for back pain, gait problem and myalgias.  Skin: Negative for color change, pallor, rash and wound.  Neurological: Positive for weakness. Negative for dizziness, tremors, syncope, speech difficulty, numbness  and headaches.  Psychiatric/Behavioral: Negative for agitation and behavioral problems.  All other systems reviewed and are negative.   Immunization History  Administered Date(s) Administered  . Pneumococcal Conjugate-13 11/18/2013   Pertinent  Health Maintenance Due  Topic Date Due  . PNA vac Low Risk Adult (1 of 2 - PCV13) 05/17/1992  . INFLUENZA VACCINE  08/28/2016  . DEXA SCAN  Completed   No flowsheet data found. Functional Status Survey:    Vitals:   10/15/16 1001  BP: 131/74  Pulse: 74  Resp: 18  Temp: 98 F (36.7 C)  SpO2: 94%  Weight: 129 lb 4.8 oz (58.7 kg)  Height: 5' (1.524 m)   Body mass index is 25.25 kg/m. Physical Exam  Constitutional: She is oriented to person, place, and time. Vital signs are normal. She appears well-developed and well-nourished. She is active and cooperative. She does not appear  ill. No distress.  HENT:  Head: Normocephalic and atraumatic.  Mouth/Throat: Uvula is midline, oropharynx is clear and moist and mucous membranes are normal. Mucous membranes are not pale, not dry and not cyanotic.  Eyes: Pupils are equal, round, and reactive to light. Conjunctivae, EOM and lids are normal.  Neck: Trachea normal, normal range of motion and full passive range of motion without pain. Neck supple. No JVD present. No tracheal deviation, no edema and no erythema present. No thyromegaly present.  Cardiovascular: Normal rate, regular rhythm, normal heart sounds, intact distal pulses and normal pulses.  Exam reveals no gallop, no distant heart sounds and no friction rub.   No murmur heard. Pulses:      Dorsalis pedis pulses are 2+ on the right side, and 2+ on the left side.  No edema  Pulmonary/Chest: Effort normal. No accessory muscle usage. No respiratory distress. She has decreased breath sounds in the right lower field and the left lower field. She has no wheezes. She has no rhonchi. She has no rales. She exhibits no tenderness.  Abdominal: Normal  appearance and bowel sounds are normal. She exhibits no distension and no ascites. There is no tenderness.  Musculoskeletal: Normal range of motion. She exhibits no edema or tenderness.  Expected osteoarthritis, stiffness. Generalized weakness  Neurological: She is alert and oriented to person, place, and time. She has normal strength.  Skin: Skin is warm, dry and intact. She is not diaphoretic. No cyanosis. No pallor. Nails show no clubbing.  Psychiatric: She has a normal mood and affect. Her speech is normal and behavior is normal. Judgment and thought content normal. Cognition and memory are normal.  Nursing note and vitals reviewed.   Labs reviewed:  Recent Labs  06/20/16 0552  10/02/16 0413 10/04/16 0416 10/10/16 0620  NA 136  < > 138 142 139  K 3.5  < > 3.7 3.6 3.8  CL 102  < > 109 108 100*  CO2 28  < > 22 25 31   GLUCOSE 125*  < > 101* 118* 97  BUN 16  < > 15 18 14   CREATININE 0.45  < > 0.61 0.72 0.57  CALCIUM 7.6*  < > 8.1* 8.3* 8.8*  MG 1.7  --   --   --   --   < > = values in this interval not displayed.  Recent Labs  05/17/16 1422 06/18/16 1956 09/16/16 1410  AST 13* 18 19  ALT <5* <5* 6*  ALKPHOS 52 40 50  BILITOT 0.5 0.8 0.5  PROT 6.9 6.4* 7.4  ALBUMIN 3.6 3.2* 3.8    Recent Labs  05/17/16 1422 06/18/16 1956  09/16/16 1410  10/02/16 0413 10/03/16 0413 10/04/16 0416  WBC 7.0 3.8  < > 9.2  < > 7.1 12.7* 11.9*  NEUTROABS 3.8 1.7  --  4.3  --   --   --   --   HGB 12.6 11.4*  < > 13.0  < > 12.4 12.4 12.2  HCT 37.1 34.7*  < > 38.9  < > 36.9 37.7 36.8  MCV 89.6 90.6  < > 94.6  < > 94.0 95.1 95.7  PLT 259 335  < > 299  < > 229 240 220  < > = values in this interval not displayed. Lab Results  Component Value Date   TSH 2.420 09/30/2016   No results found for: HGBA1C No results found for: CHOL, HDL, LDLCALC, LDLDIRECT, TRIG, CHOLHDL  Significant Diagnostic Results in last 30  days:  No results found.  Assessment/Plan 1. Weakness  generalized  Continue PT/OT  Continue exercises as taught by PT/OT  Mobile in wheelchair if unassisted  Ambulate with walker with gait belt and assistance  2. Urinary tract infection without hematuria, site unspecified  Resolved  3. Junctional (nodal) bradycardia  S/p pacemaker insertion  Stable  Skin care per protocol  Follow up with Cardiologist as instructed  Family/ staff Communication:   Total Time:  Documentation:  Face to Face:  Family/Phone:   Labs/tests ordered:    Vikki Ports, NP-C [facility] [address] [place], [place] [address] Cell Phone (Mon-Fri 8am-5pm):  [phone] On Call:  [phone] & follow prompts after 5pm & weekends Office Phone:  [phone] Office Fax:  [phone]

## 2016-10-21 ENCOUNTER — Other Ambulatory Visit
Admission: RE | Admit: 2016-10-21 | Discharge: 2016-10-21 | Disposition: A | Discharge status: Home / Self Care | Payer: Medicare Other | Source: Ambulatory Visit | Attending: Internal Medicine | Admitting: Internal Medicine

## 2016-10-21 DIAGNOSIS — R410 Disorientation, unspecified: Secondary | ICD-10-CM | POA: Insufficient documentation

## 2016-10-22 LAB — URINALYSIS, COMPLETE (UACMP) WITH MICROSCOPIC
BACTERIA UA: NONE SEEN
Bilirubin Urine: NEGATIVE
GLUCOSE, UA: NEGATIVE mg/dL
HGB URINE DIPSTICK: NEGATIVE
Ketones, ur: 5 mg/dL — AB
NITRITE: NEGATIVE
PH: 5 (ref 5.0–8.0)
Protein, ur: NEGATIVE mg/dL
Specific Gravity, Urine: 1.012 (ref 1.005–1.030)

## 2016-10-22 LAB — URINE CULTURE

## 2016-10-25 DIAGNOSIS — R531 Weakness: Secondary | ICD-10-CM | POA: Insufficient documentation

## 2016-10-28 ENCOUNTER — Encounter
Admission: RE | Admit: 2016-10-28 | Discharge: 2016-10-28 | Disposition: A | Discharge status: Home / Self Care | Payer: Medicare Other | Source: Ambulatory Visit | Attending: Internal Medicine | Admitting: Internal Medicine

## 2016-11-08 ENCOUNTER — Ambulatory Visit
Admission: RE | Admit: 2016-11-08 | Discharge: 2016-11-08 | Disposition: A | Discharge status: Home / Self Care | Payer: Medicare Other | Source: Ambulatory Visit | Attending: Surgery | Admitting: Surgery

## 2016-11-08 DIAGNOSIS — Z853 Personal history of malignant neoplasm of breast: Secondary | ICD-10-CM | POA: Diagnosis present

## 2016-11-11 ENCOUNTER — Non-Acute Institutional Stay (SKILLED_NURSING_FACILITY): Payer: Medicare Other | Admitting: Gerontology

## 2016-11-11 DIAGNOSIS — R531 Weakness: Secondary | ICD-10-CM | POA: Diagnosis not present

## 2016-11-11 DIAGNOSIS — R4182 Altered mental status, unspecified: Secondary | ICD-10-CM

## 2016-11-11 DIAGNOSIS — N390 Urinary tract infection, site not specified: Secondary | ICD-10-CM

## 2016-11-12 ENCOUNTER — Encounter: Payer: Self-pay | Admitting: Gerontology

## 2016-11-12 NOTE — Progress Notes (Signed)
Location:   [facility] Room Number: [place] of Service:  SNF [phone]) Provider:  Toni Arthurs, NP-C  Leonel Ramsay, MD  Patient Care Team: Leonel Ramsay, MD as PCP - General (Infectious Diseases)  Extended Emergency Contact Information Primary Emergency Contact: Buchbinder,Timothy L Address: [address]          [place], [place] [address] [name] of [place] Phone: [phone] Relation: Son Secondary Emergency Contact: Plaza,James Address: [address]          [place], [place] [address] [name] of [place] Phone: [phone] Work Phone: [phone] Mobile Phone: [phone] Relation: Son  Code Status:  FULL Goals of care: Advanced Directive information Advanced Directives 11/11/2016  Does Patient Have a Medical Advance Directive? No  Type of Advance Directive -  Does patient want to make changes to medical advance directive? -  Copy of [place] in Chart? -     Chief Complaint  Patient presents with  . Medical Management of Chronic Issues    Routine Visit    HPI:  Pt is a 83 y.o. female seen today for medical management of chronic diseases.  Patient was admitted to the facility for rehab for generalized weakness and deconditioning following hospitalization for UTI resulting in altered mental status patient has been participating in PT and OT.  She is now a long-term care resident.  She reports continued weakness and dyspnea with exertion.  She does report a fast recovery with rest.  Patient reports her appetite is fair, but improving.  She does not socialize much with others yet.  However, she tells me she is getting used to everything and everyone and will begin socializing more soon.  Vital signs stable.  No other complaints.   Past Medical History:  Diagnosis Date  . A-fib ([place])    unspecified  . Anemia    unspecified  . Arthritis    osteoarthritis  . Atrophic vaginitis   . Bleeding  hemorrhoid   . Breast cancer ([place])    unspecified  . Cancer ([place])    Breast  . Cataract   . Diverticulosis   . DVT (deep vein thrombosis) in pregnancy ([place])   . Hiatal hernia   . Hypertension   . Incontinence   . Osteoarthritis   . Osteoporosis    a. Intolerance to Fosamax b. Bilateral foot fractures c. IV Boniva d. Low vitamin D e. Reclast  . Parkinson disease ([place])   . PE (pulmonary thromboembolism) ([place])   . Scleritis    Idiopathic a. Prednisone b. s/p methotrexate  . TIA (transient ischemic attack) 1997   Possible  . Urinary urgency   . Vitamin B12 deficiency    Past Surgical History:  Procedure Laterality Date  . ABDOMINAL HYSTERECTOMY    . ABDOMINAL HYSTERECTOMY     partial  . BLADDER TACK SURGERY     Dr. Ouida Sills  . BREAST BIOPSY Left 10/2012   benign  . BREAST BIOPSY Left 11/22/2015   invasive mammary ca   . BREAST EXCISIONAL BIOPSY Left 12/08/2015   lumpectomy  . BREAST LUMPECTOMY Left 11/2015   Invasive mammary carcinoma Grade I  . EYE SURGERY Bilateral    Catarct Extraction with IOL  . FINGER SURGERY Right   . FOOT FRACTURE SURGERY Right 06/2003   pinning, Dr. Francia Greaves, [facility]  . FOOT SURGERY Left   . INCONTINENCE SURGERY    . NEUROMA SURGERY    . PACEMAKER INSERTION  Right 10/02/2016   Procedure: INSERTION PACEMAKER;  Surgeon: Isaias Cowman, MD;  Location: [facility] ORS;  Service: Cardiovascular;  Laterality: Right;  . PARTIAL MASTECTOMY WITH AXILLARY SENTINEL LYMPH NODE BIOPSY Left 12/08/2015   Procedure: PARTIAL MASTECTOMY WITH AXILLARY SENTINEL LYMPH NODE BIOPSY;  Surgeon: [name], MD;  Location: [facility] ORS;  Service: General;  Laterality: Left;    No Known Allergies  Allergies as of 11/11/2016   No Known Allergies     Medication List       Accurate as of 11/11/16 11:59 PM. Always use your most recent med list.          acetaminophen 500 MG tablet Commonly known as:  TYLENOL Take 500 mg by mouth every 6 (six) hours as needed  (pain).   carbidopa-levodopa 50-200 MG tablet Commonly known as:  SINEMET CR Take 3 tablets by mouth 3 (three) times daily.   cyanocobalamin 1000 MCG/ML injection Commonly known as:  (VITAMIN B-12) INJECT 1ML INTO MUSCLE ONCE A MONTH AS DIRECTED   DERMACLOUD Crea Apply liberal amount to area of skin irritation topically as needed. Ok to leave at bedside.   diltiazem 120 MG tablet Commonly known as:  CARDIZEM Take 120 mg by mouth daily.   furosemide 40 MG tablet Commonly known as:  LASIX Take 40 mg by mouth 2 (two) times daily.   hydroxypropyl methylcellulose / hypromellose 2.5 % ophthalmic solution Commonly known as:  ISOPTO TEARS / GONIOVISC Place 1 drop into both eyes 3 (three) times daily as needed for dry eyes.   Melatonin 3 MG Tabs Take 3 mg by mouth at bedtime.   mirabegron ER 25 MG Tb24 tablet Commonly known as:  MYRBETRIQ Take 25 mg by mouth daily.   potassium chloride 10 MEQ tablet Commonly known as:  K-DUR,KLOR-CON Take 10 mEq by mouth daily.   PREMARIN vaginal cream Generic drug:  conjugated estrogens Apply 0.5 mg ( pea-sized amount) just inside the vaginal introitus with finger-tip every night Mon, Wed, and Fri.   rOPINIRole 0.5 MG tablet Commonly known as:  REQUIP Take 1 tablet (0.5 mg total) by mouth at bedtime.   XARELTO 20 MG Tabs tablet Generic drug:  rivaroxaban Take 20 mg by mouth daily.       Review of Systems  Constitutional: Negative for activity change, appetite change, chills, diaphoresis and fever.  HENT: Negative for congestion, sneezing, sore throat, trouble swallowing and voice change.   Respiratory: Positive for shortness of breath (intermitttent). Negative for apnea, cough, choking, chest tightness and wheezing.   Cardiovascular: Negative for chest pain, palpitations and leg swelling.  Gastrointestinal: Negative for abdominal distention, abdominal pain, constipation, diarrhea and nausea.  Genitourinary: Negative for difficulty  urinating, dysuria, frequency and urgency.  Musculoskeletal: Positive for arthralgias (typical arthritis). Negative for back pain, gait problem and myalgias.  Skin: Negative for color change, pallor, rash and wound.  Neurological: Positive for weakness. Negative for dizziness, tremors, syncope, speech difficulty, numbness and headaches.  Psychiatric/Behavioral: Negative for agitation and behavioral problems.  All other systems reviewed and are negative.   Immunization History  Administered Date(s) Administered  . Influenza-Unspecified 10/22/2016  . Pneumococcal Conjugate-13 11/18/2013   Pertinent  Health Maintenance Due  Topic Date Due  . PNA vac Low Risk Adult (2 of 2 - PPSV23) 11/19/2014  . INFLUENZA VACCINE  Completed  . DEXA SCAN  Completed   No flowsheet data found. Functional Status Survey:    Vitals:   11/11/16 1200  BP: (!) 156/56  Pulse: Marland Kitchen)  102  Resp: 16  Temp: 98.4 F (36.9 C)  SpO2: 97%  Weight: 129 lb (58.5 kg)  Height: 5' (1.524 m)   Body mass index is 25.19 kg/m. Physical Exam  Constitutional: She is oriented to person, place, and time. Vital signs are normal. She appears well-developed and well-nourished. She is active and cooperative. She does not appear ill. No distress.  HENT:  Head: Normocephalic and atraumatic.  Mouth/Throat: Uvula is midline, oropharynx is clear and moist and mucous membranes are normal. Mucous membranes are not pale, not dry and not cyanotic.  Eyes: Pupils are equal, round, and reactive to light. Conjunctivae, EOM and lids are normal.  Neck: Trachea normal, normal range of motion and full passive range of motion without pain. Neck supple. No JVD present. No tracheal deviation, no edema and no erythema present. No thyromegaly present.  Cardiovascular: Normal rate, regular rhythm, normal heart sounds, intact distal pulses and normal pulses.  Exam reveals no gallop and no distant heart sounds.   Pulses:      Dorsalis pedis pulses are  2+ on the right side, and 2+ on the left side.  No edema  Pulmonary/Chest: Effort normal and breath sounds normal. No accessory muscle usage. No respiratory distress. She has no decreased breath sounds. She has no wheezes. She has no rhonchi. She has no rales. She exhibits no tenderness.  Abdominal: Normal appearance and bowel sounds are normal. She exhibits no distension and no ascites. There is no tenderness.  Musculoskeletal: Normal range of motion. She exhibits no edema or tenderness.  Expected osteoarthritis, stiffness  Neurological: She is alert and oriented to person, place, and time. She has normal strength.  Skin: Skin is warm, dry and intact. She is not diaphoretic. No cyanosis. No pallor. Nails show no clubbing.  Psychiatric: She has a normal mood and affect. Her speech is normal and behavior is normal. Judgment and thought content normal. Cognition and memory are normal.  Nursing note and vitals reviewed.   Labs reviewed:  Recent Labs  06/20/16 0552  10/02/16 0413 10/04/16 0416 10/10/16 0620  NA 136  < > 138 142 139  K 3.5  < > 3.7 3.6 3.8  CL 102  < > 109 108 100*  CO2 28  < > 22 25 31   GLUCOSE 125*  < > 101* 118* 97  BUN 16  < > 15 18 14   CREATININE 0.45  < > 0.61 0.72 0.57  CALCIUM 7.6*  < > 8.1* 8.3* 8.8*  MG 1.7  --   --   --   --   < > = values in this interval not displayed.  Recent Labs  05/17/16 1422 06/18/16 1956 09/16/16 1410  AST 13* 18 19  ALT <5* <5* 6*  ALKPHOS 52 40 50  BILITOT 0.5 0.8 0.5  PROT 6.9 6.4* 7.4  ALBUMIN 3.6 3.2* 3.8    Recent Labs  05/17/16 1422 06/18/16 1956  09/16/16 1410  10/02/16 0413 10/03/16 0413 10/04/16 0416  WBC 7.0 3.8  < > 9.2  < > 7.1 12.7* 11.9*  NEUTROABS 3.8 1.7  --  4.3  --   --   --   --   HGB 12.6 11.4*  < > 13.0  < > 12.4 12.4 12.2  HCT 37.1 34.7*  < > 38.9  < > 36.9 37.7 36.8  MCV 89.6 90.6  < > 94.6  < > 94.0 95.1 95.7  PLT 259 335  < > 299  < >  229 240 220  < > = values in this interval not  displayed. Lab Results  Component Value Date   TSH 2.420 09/30/2016   No results found for: HGBA1C No results found for: CHOL, HDL, LDLCALC, LDLDIRECT, TRIG, CHOLHDL  Significant Diagnostic Results in last 30 days:  Mm Diag Breast Tomo Bilateral  Result Date: 11/08/2016 CLINICAL DATA:  History of left breast cancer status post lumpectomy in October of 2017.Patient has had a 20 pound weight loss secondary to pneumonia. EXAM: 2D DIGITAL DIAGNOSTIC BILATERAL MAMMOGRAM WITH CAD AND ADJUNCT TOMO COMPARISON:  Previous exam(s). ACR Breast Density Category b: There are scattered areas of fibroglandular density. FINDINGS: Patient could not stand for the study. Limited MLO and CC views were obtained. The patient could not tolerate a spot magnification view of the lumpectomy site. Lumpectomy changes are seen in the left breast. No suspicious mass or malignant type microcalcifications identified in either breast. Mammographic images were processed with CAD. IMPRESSION: No evidence of malignancy in either breast. RECOMMENDATION: Bilateral diagnostic mammogram in 1 year is recommended. I have discussed the findings and recommendations with the patient. Results were also provided in writing at the conclusion of the visit. If applicable, a reminder letter will be sent to the patient regarding the next appointment. BI-RADS CATEGORY  2: Benign. Electronically Signed   By: Lillia Mountain M.D.   On: 11/08/2016 11:43    Assessment/Plan 1.  Altered mental status, unspecified altered mental status type  Resolved  2.  Generalized weakness  Continue exercises as taught by PT/OT  Mobile in wheelchair unassisted  Ambulate with walker with gait belt and assistance  3.  Urinary tract infection without hematuria, site unspecified  Resolved   Family/ staff Communication:   Total Time:  Documentation:  Face to Face:  Family/Phone:   Labs/tests ordered: Not due  Medication list reviewed and assessed for  continued appropriateness. Monthly medication orders reviewed and signed.  Vikki Ports, NP-C [facility] [phone], [place] [address] Cell Phone (Mon-Fri 8am-5pm):  [phone] On Call:  [phone] & follow prompts after 5pm & weekends Office Phone:  [phone] Office Fax:  [phone]

## 2016-11-28 ENCOUNTER — Encounter
Admission: RE | Admit: 2016-11-28 | Discharge: 2016-11-28 | Disposition: A | Discharge status: Home / Self Care | Payer: Medicare Other | Source: Ambulatory Visit | Attending: Internal Medicine | Admitting: Internal Medicine

## 2016-12-09 ENCOUNTER — Non-Acute Institutional Stay (SKILLED_NURSING_FACILITY): Payer: Medicare Other | Admitting: Gerontology

## 2016-12-09 DIAGNOSIS — Z853 Personal history of malignant neoplasm of breast: Secondary | ICD-10-CM

## 2016-12-09 DIAGNOSIS — E538 Deficiency of other specified B group vitamins: Secondary | ICD-10-CM

## 2016-12-09 DIAGNOSIS — I495 Sick sinus syndrome: Secondary | ICD-10-CM

## 2016-12-09 DIAGNOSIS — N3281 Overactive bladder: Secondary | ICD-10-CM

## 2016-12-09 DIAGNOSIS — G2581 Restless legs syndrome: Secondary | ICD-10-CM

## 2016-12-09 DIAGNOSIS — I5032 Chronic diastolic (congestive) heart failure: Secondary | ICD-10-CM

## 2016-12-09 DIAGNOSIS — G20 Parkinson's disease: Secondary | ICD-10-CM

## 2016-12-09 DIAGNOSIS — M810 Age-related osteoporosis without current pathological fracture: Secondary | ICD-10-CM | POA: Diagnosis not present

## 2016-12-09 DIAGNOSIS — G4700 Insomnia, unspecified: Secondary | ICD-10-CM

## 2016-12-09 DIAGNOSIS — I110 Hypertensive heart disease with heart failure: Secondary | ICD-10-CM | POA: Diagnosis not present

## 2016-12-09 DIAGNOSIS — I2699 Other pulmonary embolism without acute cor pulmonale: Secondary | ICD-10-CM | POA: Diagnosis not present

## 2016-12-09 DIAGNOSIS — R1312 Dysphagia, oropharyngeal phase: Secondary | ICD-10-CM

## 2016-12-09 DIAGNOSIS — N952 Postmenopausal atrophic vaginitis: Secondary | ICD-10-CM | POA: Diagnosis not present

## 2016-12-09 DIAGNOSIS — I824Z1 Acute embolism and thrombosis of unspecified deep veins of right distal lower extremity: Secondary | ICD-10-CM | POA: Diagnosis not present

## 2016-12-09 DIAGNOSIS — I480 Paroxysmal atrial fibrillation: Secondary | ICD-10-CM | POA: Diagnosis not present

## 2016-12-09 DIAGNOSIS — R531 Weakness: Secondary | ICD-10-CM

## 2016-12-09 DIAGNOSIS — H04123 Dry eye syndrome of bilateral lacrimal glands: Secondary | ICD-10-CM

## 2016-12-09 DIAGNOSIS — Z79890 Hormone replacement therapy (postmenopausal): Secondary | ICD-10-CM

## 2016-12-10 ENCOUNTER — Encounter: Payer: Self-pay | Admitting: Gerontology

## 2016-12-23 ENCOUNTER — Other Ambulatory Visit: Payer: Medicare Other

## 2016-12-23 ENCOUNTER — Ambulatory Visit: Payer: Medicare Other | Admitting: Hematology and Oncology

## 2016-12-23 ENCOUNTER — Ambulatory Visit: Payer: Medicare Other

## 2016-12-24 ENCOUNTER — Inpatient Hospital Stay: Payer: Medicare Other | Attending: Hematology and Oncology

## 2016-12-24 ENCOUNTER — Inpatient Hospital Stay: Payer: Medicare Other

## 2016-12-24 ENCOUNTER — Inpatient Hospital Stay (HOSPITAL_BASED_OUTPATIENT_CLINIC_OR_DEPARTMENT_OTHER): Payer: Medicare Other | Admitting: Hematology and Oncology

## 2016-12-24 VITALS — BP 96/60 | HR 88 | Temp 97.3°F | Resp 18 | Wt 125.0 lb

## 2016-12-24 DIAGNOSIS — Z8673 Personal history of transient ischemic attack (TIA), and cerebral infarction without residual deficits: Secondary | ICD-10-CM

## 2016-12-24 DIAGNOSIS — Z9223 Personal history of estrogen therapy: Secondary | ICD-10-CM | POA: Insufficient documentation

## 2016-12-24 DIAGNOSIS — Z7901 Long term (current) use of anticoagulants: Secondary | ICD-10-CM | POA: Diagnosis not present

## 2016-12-24 DIAGNOSIS — M1990 Unspecified osteoarthritis, unspecified site: Secondary | ICD-10-CM | POA: Insufficient documentation

## 2016-12-24 DIAGNOSIS — G20 Parkinson's disease: Secondary | ICD-10-CM | POA: Insufficient documentation

## 2016-12-24 DIAGNOSIS — Z87440 Personal history of urinary (tract) infections: Secondary | ICD-10-CM | POA: Diagnosis not present

## 2016-12-24 DIAGNOSIS — Z90710 Acquired absence of both cervix and uterus: Secondary | ICD-10-CM

## 2016-12-24 DIAGNOSIS — R32 Unspecified urinary incontinence: Secondary | ICD-10-CM

## 2016-12-24 DIAGNOSIS — R634 Abnormal weight loss: Secondary | ICD-10-CM | POA: Insufficient documentation

## 2016-12-24 DIAGNOSIS — Z86711 Personal history of pulmonary embolism: Secondary | ICD-10-CM | POA: Insufficient documentation

## 2016-12-24 DIAGNOSIS — E611 Iron deficiency: Secondary | ICD-10-CM

## 2016-12-24 DIAGNOSIS — E538 Deficiency of other specified B group vitamins: Secondary | ICD-10-CM

## 2016-12-24 DIAGNOSIS — I10 Essential (primary) hypertension: Secondary | ICD-10-CM | POA: Insufficient documentation

## 2016-12-24 DIAGNOSIS — C50412 Malignant neoplasm of upper-outer quadrant of left female breast: Secondary | ICD-10-CM | POA: Insufficient documentation

## 2016-12-24 DIAGNOSIS — M810 Age-related osteoporosis without current pathological fracture: Secondary | ICD-10-CM

## 2016-12-24 DIAGNOSIS — Z950 Presence of cardiac pacemaker: Secondary | ICD-10-CM

## 2016-12-24 DIAGNOSIS — Z79899 Other long term (current) drug therapy: Secondary | ICD-10-CM | POA: Insufficient documentation

## 2016-12-24 DIAGNOSIS — Z170 Estrogen receptor positive status [ER+]: Secondary | ICD-10-CM | POA: Diagnosis not present

## 2016-12-24 DIAGNOSIS — I4891 Unspecified atrial fibrillation: Secondary | ICD-10-CM | POA: Insufficient documentation

## 2016-12-24 DIAGNOSIS — Z86718 Personal history of other venous thrombosis and embolism: Secondary | ICD-10-CM | POA: Insufficient documentation

## 2016-12-24 LAB — COMPREHENSIVE METABOLIC PANEL
ALT: 5 U/L — ABNORMAL LOW (ref 14–54)
AST: 17 U/L (ref 15–41)
Albumin: 3.6 g/dL (ref 3.5–5.0)
Alkaline Phosphatase: 73 U/L (ref 38–126)
Anion gap: 10 (ref 5–15)
BUN: 13 mg/dL (ref 6–20)
CO2: 29 mmol/L (ref 22–32)
Calcium: 8.8 mg/dL — ABNORMAL LOW (ref 8.9–10.3)
Chloride: 98 mmol/L — ABNORMAL LOW (ref 101–111)
Creatinine, Ser: 0.62 mg/dL (ref 0.44–1.00)
GFR calc Af Amer: >60 mL/min (ref >60)
GFR calc non Af Amer: >60 mL/min (ref >60)
Glucose, Bld: 112 mg/dL — ABNORMAL HIGH (ref 65–99)
Potassium: 3.5 mmol/L (ref 3.5–5.1)
Sodium: 137 mmol/L (ref 135–145)
Total Bilirubin: 0.7 mg/dL (ref 0.3–1.2)
Total Protein: 6.9 g/dL (ref 6.5–8.1)

## 2016-12-24 LAB — CBC WITH DIFFERENTIAL/PLATELET
Basophils Absolute: 0.1 10*3/uL (ref 0–0.1)
Basophils Relative: 1 %
Eosinophils Absolute: 0.2 10*3/uL (ref 0–0.7)
Eosinophils Relative: 2 %
HCT: 39.5 % (ref 35.0–47.0)
Hemoglobin: 13.1 g/dL (ref 12.0–16.0)
Lymphocytes Relative: 30 %
Lymphs Abs: 2.2 10*3/uL (ref 1.0–3.6)
MCH: 29.7 pg (ref 26.0–34.0)
MCHC: 33.1 g/dL (ref 32.0–36.0)
MCV: 89.8 fL (ref 80.0–100.0)
Monocytes Absolute: 0.7 10*3/uL (ref 0.2–0.9)
Monocytes Relative: 9 %
Neutro Abs: 4.2 10*3/uL (ref 1.4–6.5)
Neutrophils Relative %: 58 %
Platelets: 298 10*3/uL (ref 150–440)
RBC: 4.4 MIL/uL (ref 3.80–5.20)
RDW: 19.1 % — ABNORMAL HIGH (ref 11.5–14.5)
WBC: 7.3 10*3/uL (ref 3.6–11.0)

## 2016-12-24 MED ORDER — DENOSUMAB 60 MG/ML ~~LOC~~ SOLN
60.0000 mg | Freq: Once | SUBCUTANEOUS | Status: AC
  Administered 2016-12-24: 60 mg via SUBCUTANEOUS
  Filled 2016-12-24: qty 1

## 2016-12-24 NOTE — Progress Notes (Signed)
[facility] day:  12/24/2016   Chief Complaint: Laura Cisneros is a 83 y.o. female with clinical stage I left breast cancer who is seen for 3 month assessment and prior to every 6 month Prolia.  HPI: The patient was last seen in the medical oncology clinic on 09/16/2016.  At that time, she felt "pretty good".  She had increased fatigued. She reported that her heart will "fluctuate" when she ambulates (newly diagnosed atrial fibrillation).  It was noted that tamoxifen was discontinued by her cardiologist.  Aromatase inhibitors were discussed.  Patient has had an internal pacemaker placed due to symptomatic bradycardia since she was here last. She has been seen in follow up by Dr. Ubaldo Glassing on 12/12/2016. Patient also makes mention of a week long hospitalization for a urinary tract infection.   Patient has transferred residence to [facility] since her last visit. She initially went in for rehab, however a room became available and she decided to make the move permanent.   Regarding her Parkinson's, patient reports an increase in symptoms. She has been seen in consult by Dr. Manuella Ghazi. She is taking Sinemet 50-'200mg'$  TID. Patient has some difficulties with ambulation.   Today, patient reports that she is feeling "pretty good".  She denies vertiginous symptoms. She states, "Overall, I think I am doing better". Patient complains of chronic arthritic pain. Patient eating well, however she has lost 10 pounds since her last visit. Of note, patient is on diuretic therapy and is having her weights checked daily at [facility].   Patient continues on Xarelto.     Past Medical History:  Diagnosis Date  . A-fib ([place])    unspecified  . Anemia    unspecified  . Arthritis    osteoarthritis  . Atrophic vaginitis   . Bleeding hemorrhoid   . Breast cancer ([place])    unspecified  . Cancer ([place])    Breast  . Cataract   . Diverticulosis   . DVT (deep vein thrombosis) in  pregnancy ([place])   . Hiatal hernia   . Hypertension   . Incontinence   . Osteoarthritis   . Osteoporosis    a. Intolerance to Fosamax b. Bilateral foot fractures c. IV Boniva d. Low vitamin D e. Reclast  . Parkinson disease ([place])   . PE (pulmonary thromboembolism) ([place])   . Scleritis    Idiopathic a. Prednisone b. s/p methotrexate  . TIA (transient ischemic attack) 1997   Possible  . Urinary urgency   . Vitamin B12 deficiency     Past Surgical History:  Procedure Laterality Date  . ABDOMINAL HYSTERECTOMY    . ABDOMINAL HYSTERECTOMY     partial  . BLADDER TACK SURGERY     Dr. Ouida Sills  . BREAST BIOPSY Left 10/2012   benign  . BREAST BIOPSY Left 11/22/2015   invasive mammary ca   . BREAST EXCISIONAL BIOPSY Left 12/08/2015   lumpectomy  . BREAST LUMPECTOMY Left 11/2015   Invasive mammary carcinoma Grade I  . EYE SURGERY Bilateral    Catarct Extraction with IOL  . FINGER SURGERY Right   . FOOT FRACTURE SURGERY Right 06/2003   pinning, Dr. Francia Greaves, [facility]  . FOOT SURGERY Left   . INCONTINENCE SURGERY    . NEUROMA SURGERY    . PACEMAKER INSERTION Right 10/02/2016   Procedure: INSERTION PACEMAKER;  Surgeon: Isaias Cowman, MD;  Location: [facility] ORS;  Service: Cardiovascular;  Laterality: Right;  . PARTIAL MASTECTOMY WITH AXILLARY SENTINEL  LYMPH NODE BIOPSY Left 12/08/2015   Procedure: PARTIAL MASTECTOMY WITH AXILLARY SENTINEL LYMPH NODE BIOPSY;  Surgeon: [name], MD;  Location: [facility] ORS;  Service: General;  Laterality: Left;    Family History  Problem Relation Age of Onset  . Esophageal cancer Sister   . Kidney disease Neg Hx   . Bladder Cancer Neg Hx   . Breast cancer Neg Hx     Social History:  reports that  has never smoked. she has never used smokeless tobacco. She reports that she does not drink alcohol or use drugs.  She denies any exposure to radiation or toxins.  She previously worked in a [employer].  Her son's phone number is [phone].  Her grand  daughter is [name].  The patient is accompanied by her son, [name].  Allergies: No Known Allergies  Current Medications: Current Outpatient Medications  Medication Sig Dispense Refill  . acetaminophen (TYLENOL) 500 MG tablet Take 500 mg by mouth every 6 (six) hours as needed (pain).    Marland Kitchen alum & mag hydroxide-simeth (MAALOX PLUS) 400-400-40 MG/5ML suspension Take 30 mLs every 4 (four) hours as needed by mouth for indigestion.    . carbidopa-levodopa (SINEMET CR) 50-200 MG tablet Take 3 tablets by mouth 3 (three) times daily.    Marland Kitchen conjugated estrogens (PREMARIN) vaginal cream Apply 0.5 mg ( pea-sized amount) just inside the vaginal introitus with finger-tip weekly on Monday nights    . cyanocobalamin (,VITAMIN B-12,) 1000 MCG/ML injection INJECT 1ML INTO MUSCLE ONCE A MONTH AS DIRECTED    . diltiazem (CARDIZEM) 120 MG tablet Take 120 mg by mouth daily.    . furosemide (LASIX) 40 MG tablet Take 40 mg by mouth 2 (two) times daily.    . hydroxypropyl methylcellulose / hypromellose (ISOPTO TEARS / GONIOVISC) 2.5 % ophthalmic solution Place 1 drop into both eyes 3 (three) times daily as needed for dry eyes.    . Hyprom-Naphaz-Polysorb-Zn Sulf (CLEAR EYES COMPLETE) SOLN Place 2 drops every 2 (two) hours as needed into both eyes. For dry, irritated eyes. Ok to leave at bedside.    . [place] (DERMACLOUD) CREA Apply liberal amount to area of skin irritation topically as needed. Ok to leave at bedside.    . Melatonin 3 MG TABS Take 3 mg by mouth at bedtime.    . mirabegron ER (MYRBETRIQ) 25 MG TB24 tablet Take 25 mg by mouth daily.    . potassium chloride (K-DUR,KLOR-CON) 10 MEQ tablet Take 10 mEq by mouth daily.    Marland Kitchen rOPINIRole (REQUIP) 0.5 MG tablet Take 1 tablet (0.5 mg total) by mouth at bedtime.    Alveda Reasons 20 MG TABS tablet Take 20 mg by mouth daily.   0   No current facility-administered medications for this visit.     Review of Systems:  GENERAL: Feels "pretty good".  No fevers or  sweats.  Weight down 10 pounds since last clinic visit. PERFORMANCE STATUS (ECOG):  2 HEENT:  No visual changes, runny nose, sore throat, mouth sores or tenderness. Lungs: No shortness of breath or cough.  No hemoptysis.  Interval pulmonary embolism. Cardiac:  No chest pain, palpitations, orthopnea, or PND. GI:  No nausea, vomiting, diarrhea, constipation, melena or hematochezia. GU:  No urgency, frequency, dysuria, or hematuria. h/o recurrent UTIs and vaginal atrophy on estrogen cream sparingly. Musculoskeletal:  No back pain.  Arthritis pain.  No muscle tenderness. Took Zometa x 5 years. Extremities:  No pain or swelling. Skin:  No rashes  or skin changes. Neuro:  Parkinson's disease.  No headache, numbness or weakness,or coordination issues.  Gait is unstable. Endocrine:  No diabetes, thyroid issues, hot flashes or night sweats. Psych:  No mood changes, depression or anxiety. Pain:  No focal pain. Review of systems:  All other systems reviewed and found to be negative.  Physical Exam: Blood pressure 96/60, pulse 88, temperature (!) 97.3 F (36.3 C), resp. rate 18, weight 125 lb (56.7 kg). GENERAL:  Thin elderly woman sitting comfortably in the exam room in no acute distress. HEAD:  Curly gray hair.  Normocephalic, atraumatic, face symmetric, no Cushingoid features. EYES:Blueeyes. Pupils equal round and reactive to light and accomodation. No conjunctivitis or scleral icterus. ENT:  Oropharynx clear without lesion.  Dentures.  Tongue normal. Mucous membranes moist. RESPIRATORY:Clear to auscultationwithout rales, wheezes or rhonchi. CARDIOVASCULAR:Regular rate andrhythmwithout murmur, rub or gallop. BREAST:  Right breast with fibrocystic changes; no nipple discharge.  Left breast with well healed lateral incision.  Fibrocystic changes laterally.  No skin changes or nipple discharge.  ABDOMEN:Soft, non-tender, with active bowel sounds, and no hepatosplenomegaly. No  masses. SKIN: No rashes, ulcers or lesions. EXTREMITIES: Ankle edema.  No skin discoloration or tenderness. No palpable cords. LYMPHNODES: No palpable cervical, supraclavicular, axillary or inguinal adenopathy  NEUROLOGICAL: Tremor. PSYCH: Appropriate.   Appointment on 12/24/2016  Component Date Value Ref Range Status  . WBC 12/24/2016 7.3  3.6 - 11.0 K/uL Final  . RBC 12/24/2016 4.40  3.80 - 5.20 MIL/uL Final  . Hemoglobin 12/24/2016 13.1  12.0 - 16.0 g/dL Final  . HCT 12/24/2016 39.5  35.0 - 47.0 % Final  . MCV 12/24/2016 89.8  80.0 - 100.0 fL Final  . MCH 12/24/2016 29.7  26.0 - 34.0 pg Final  . MCHC 12/24/2016 33.1  32.0 - 36.0 g/dL Final  . RDW 12/24/2016 19.1* 11.5 - 14.5 % Final  . Platelets 12/24/2016 298  150 - 440 K/uL Final  . Neutrophils Relative % 12/24/2016 58  % Final  . Neutro Abs 12/24/2016 4.2  1.4 - 6.5 K/uL Final  . Lymphocytes Relative 12/24/2016 30  % Final  . Lymphs Abs 12/24/2016 2.2  1.0 - 3.6 K/uL Final  . Monocytes Relative 12/24/2016 9  % Final  . Monocytes Absolute 12/24/2016 0.7  0.2 - 0.9 K/uL Final  . Eosinophils Relative 12/24/2016 2  % Final  . Eosinophils Absolute 12/24/2016 0.2  0 - 0.7 K/uL Final  . Basophils Relative 12/24/2016 1  % Final  . Basophils Absolute 12/24/2016 0.1  0 - 0.1 K/uL Final  . Sodium 12/24/2016 137  135 - 145 mmol/L Final  . Potassium 12/24/2016 3.5  3.5 - 5.1 mmol/L Final  . Chloride 12/24/2016 98* 101 - 111 mmol/L Final  . CO2 12/24/2016 29  22 - 32 mmol/L Final  . Glucose, Bld 12/24/2016 112* 65 - 99 mg/dL Final  . BUN 12/24/2016 13  6 - 20 mg/dL Final  . Creatinine, Ser 12/24/2016 0.62  0.44 - 1.00 mg/dL Final  . Calcium 12/24/2016 8.8* 8.9 - 10.3 mg/dL Final  . Total Protein 12/24/2016 6.9  6.5 - 8.1 g/dL Final  . Albumin 12/24/2016 3.6  3.5 - 5.0 g/dL Final  . AST 12/24/2016 17  15 - 41 U/L Final  . ALT 12/24/2016 5* 14 - 54 U/L Final  . Alkaline Phosphatase 12/24/2016 73  38 - 126 U/L Final  . Total  Bilirubin 12/24/2016 0.7  0.3 - 1.2 mg/dL Final  . GFR calc non  Af Amer 12/24/2016 >60  >60 mL/min Final  . GFR calc Af Amer 12/24/2016 >60  >60 mL/min Final   Comment: (NOTE) The eGFR has been calculated using the CKD EPI equation. This calculation has not been validated in all clinical situations. eGFR's persistently <60 mL/min signify possible Chronic Kidney Disease.   . Anion gap 12/24/2016 10  5 - 15 Final    Assessment:  STUART GUILLEN is a 83 y.o. female with stage I left breast cancer s/p partial mastectomy and sentinel lymph node biopsy on 12/08/2015.  Pathology revealed a 0.7 cm grade I  invasive mammary carcinoma of no special type.  There was no lymphvascular invasion.  Margins were clear.  One sentinel lymph node was negative.  Tumor was ER positive (> 90%), PR negative, and HER-2/neu 2+ (negative by FISH).  Pathologic stage was T1bN0.  CA 27.29 was 27.6 on 12/08/2015.  Bilateral diagnostic mammogram on 11/06/2015 revealed a suspicious 5 mm mass in the upper outer left breast.   Radiation therapy was deferred secondary to her age, Parkinson's disease, and small well differentiated ER/PR+ tumor.  She began tamoxifen on 01/12/2016.  Tamoxifen was discontinued on 06/18/2016 after a DVT and pulmonary embolism.  CA27.29 has been followed: 27.6 on 12/04/2015, 24.4 on 05/17/2016, and 32.3 on 09/16/2016.  She has osteoporosis and was previously on Zometa for 5 years.  Bone density study on 12/05/2015 revealed osteoporosis with a T-score of -2.9 in AP spine L1-L4 and -2.5 in the right femoral neck.  She began Prolia on 12/19/2015 (last injection on 06/17/2016).  She has Parkinson's disease.  Gait is unstable.  She has a history of recurrent UTIs, vaginal atrophy, and incontinence.  She is using estrogen cream sparingly (once a week).  She has a history of iron deficiency.  She notes 2 colonoscopies in the past. She has been on B12 shots for years.   She was diagnosed with a DVT and  pulmonary embolism on 06/05/2016.  Right lower extremity duplex revealed a DVT in the right lower calf veins.  CT angiogram revealed an acute embolism of the right lower lobar artery extending into the anterior, lateral and posterior basal segmental arteries within the right upper lobe anterior segmental artery.  There was no CT evidence of acute right heart strain.  She is on Xarelto.  Symptomatically, she feels "pretty good" and "better" overall.  Patient continues on Xarelto.   Plan: 1.  Labs today:  CBC with diff, CMP, CA27.29. 2.  Discuss need for calcium and Vitamin D supplementation given the patients history of osteoporosis. Patient has previously discontinued these supplements. Patient to restart calcium '1200mg'$  daily and Vitamin D 800 units daily.  3. Discuss treatment options since tamoxifen was discontinued. Discussed hormonal therapy (aromatase inhibitors) and potential side effects; discussed Femara, Arimidex, Aromasin; information provided on Femara. Patient has elected not to restart hormonal therapy at this time.  4.  Prolia injection today.  5.  Continue anticoagulation (Xarelto) as prescribed. Dr. Ubaldo Glassing managing this medication.  6.  RTC in 1 month for labs (BMP). 7.  RTC in 3 months for MD assessment, labs (CBC with diff, CMP, CA27.29).   Honor Loh, NP  12/24/2016, 12:43 PM   I saw and evaluated the patient, participating in the key portions of the service and reviewing pertinent diagnostic studies and records.  I reviewed the nurse practitioner's note and agree with the findings and the plan.  The assessment and plan were discussed with the patient.  A  few questions were asked by the patient and answered.   Nolon Stalls, MD 12/24/2016,12:43 PM

## 2016-12-24 NOTE — Progress Notes (Signed)
Patient here today for f/u regarding breast cancer.  Patient's BP is low today.  Sitting 96/60- HR 88  Standing 94/63 Hr - 101.  Patient jsut saw Dr. Ubaldo Glassing on 11-5.  He is working with patient to regulate BP.  Patient is currently a resident @ [facility].

## 2016-12-25 LAB — CANCER ANTIGEN 27.29: CA 27.29: 27.9 U/mL (ref 0.0–38.6)

## 2016-12-28 ENCOUNTER — Encounter
Admission: RE | Admit: 2016-12-28 | Discharge: 2016-12-28 | Disposition: A | Discharge status: Home / Self Care | Payer: Medicare Other | Source: Ambulatory Visit | Attending: Internal Medicine | Admitting: Internal Medicine

## 2017-01-13 ENCOUNTER — Non-Acute Institutional Stay (SKILLED_NURSING_FACILITY): Payer: Medicare Other | Admitting: Gerontology

## 2017-01-13 ENCOUNTER — Encounter: Payer: Self-pay | Admitting: Gerontology

## 2017-01-13 ENCOUNTER — Encounter: Payer: Self-pay | Admitting: Hematology and Oncology

## 2017-01-13 ENCOUNTER — Ambulatory Visit: Payer: Medicare Other | Admitting: Urology

## 2017-01-13 DIAGNOSIS — I110 Hypertensive heart disease with heart failure: Secondary | ICD-10-CM

## 2017-01-13 DIAGNOSIS — G20 Parkinson's disease: Secondary | ICD-10-CM

## 2017-01-13 DIAGNOSIS — N3281 Overactive bladder: Secondary | ICD-10-CM

## 2017-01-13 DIAGNOSIS — Z79890 Hormone replacement therapy (postmenopausal): Secondary | ICD-10-CM

## 2017-01-13 DIAGNOSIS — I495 Sick sinus syndrome: Secondary | ICD-10-CM

## 2017-01-13 DIAGNOSIS — G4700 Insomnia, unspecified: Secondary | ICD-10-CM

## 2017-01-13 DIAGNOSIS — R1312 Dysphagia, oropharyngeal phase: Secondary | ICD-10-CM

## 2017-01-13 DIAGNOSIS — R531 Weakness: Secondary | ICD-10-CM

## 2017-01-13 DIAGNOSIS — I5032 Chronic diastolic (congestive) heart failure: Secondary | ICD-10-CM

## 2017-01-13 DIAGNOSIS — Z853 Personal history of malignant neoplasm of breast: Secondary | ICD-10-CM

## 2017-01-13 DIAGNOSIS — N952 Postmenopausal atrophic vaginitis: Secondary | ICD-10-CM

## 2017-01-13 DIAGNOSIS — G2581 Restless legs syndrome: Secondary | ICD-10-CM

## 2017-01-13 DIAGNOSIS — I2699 Other pulmonary embolism without acute cor pulmonale: Secondary | ICD-10-CM

## 2017-01-13 DIAGNOSIS — I480 Paroxysmal atrial fibrillation: Secondary | ICD-10-CM

## 2017-01-13 DIAGNOSIS — M810 Age-related osteoporosis without current pathological fracture: Secondary | ICD-10-CM

## 2017-01-13 DIAGNOSIS — H04123 Dry eye syndrome of bilateral lacrimal glands: Secondary | ICD-10-CM

## 2017-01-13 DIAGNOSIS — I824Z1 Acute embolism and thrombosis of unspecified deep veins of right distal lower extremity: Secondary | ICD-10-CM

## 2017-01-13 DIAGNOSIS — E538 Deficiency of other specified B group vitamins: Secondary | ICD-10-CM

## 2017-01-23 ENCOUNTER — Inpatient Hospital Stay: Payer: Medicare Other | Attending: Hematology and Oncology

## 2017-01-23 DIAGNOSIS — Z9223 Personal history of estrogen therapy: Secondary | ICD-10-CM | POA: Diagnosis not present

## 2017-01-23 DIAGNOSIS — E538 Deficiency of other specified B group vitamins: Secondary | ICD-10-CM | POA: Diagnosis not present

## 2017-01-23 DIAGNOSIS — C50412 Malignant neoplasm of upper-outer quadrant of left female breast: Secondary | ICD-10-CM | POA: Diagnosis present

## 2017-01-23 DIAGNOSIS — Z170 Estrogen receptor positive status [ER+]: Secondary | ICD-10-CM | POA: Insufficient documentation

## 2017-01-23 DIAGNOSIS — D509 Iron deficiency anemia, unspecified: Secondary | ICD-10-CM | POA: Insufficient documentation

## 2017-01-23 LAB — BASIC METABOLIC PANEL
Anion gap: 7 (ref 5–15)
BUN: 13 mg/dL (ref 6–20)
CO2: 28 mmol/L (ref 22–32)
Calcium: 8.9 mg/dL (ref 8.9–10.3)
Chloride: 102 mmol/L (ref 101–111)
Creatinine, Ser: 0.57 mg/dL (ref 0.44–1.00)
GFR calc Af Amer: >60 mL/min (ref >60)
GFR calc non Af Amer: >60 mL/min (ref >60)
Glucose, Bld: 95 mg/dL (ref 65–99)
Potassium: 4.2 mmol/L (ref 3.5–5.1)
Sodium: 137 mmol/L (ref 135–145)

## 2017-01-28 ENCOUNTER — Encounter
Admission: RE | Admit: 2017-01-28 | Discharge: 2017-01-28 | Disposition: A | Discharge status: Home / Self Care | Payer: Medicare Other | Source: Ambulatory Visit | Attending: Internal Medicine | Admitting: Internal Medicine

## 2017-01-28 DIAGNOSIS — Z853 Personal history of malignant neoplasm of breast: Secondary | ICD-10-CM | POA: Insufficient documentation

## 2017-01-28 DIAGNOSIS — I495 Sick sinus syndrome: Secondary | ICD-10-CM | POA: Insufficient documentation

## 2017-01-28 DIAGNOSIS — N952 Postmenopausal atrophic vaginitis: Secondary | ICD-10-CM

## 2017-01-28 DIAGNOSIS — E538 Deficiency of other specified B group vitamins: Secondary | ICD-10-CM | POA: Insufficient documentation

## 2017-01-28 DIAGNOSIS — I110 Hypertensive heart disease with heart failure: Secondary | ICD-10-CM | POA: Insufficient documentation

## 2017-01-28 DIAGNOSIS — I5032 Chronic diastolic (congestive) heart failure: Secondary | ICD-10-CM | POA: Insufficient documentation

## 2017-01-28 DIAGNOSIS — R1312 Dysphagia, oropharyngeal phase: Secondary | ICD-10-CM | POA: Insufficient documentation

## 2017-01-28 DIAGNOSIS — Z79890 Hormone replacement therapy (postmenopausal): Secondary | ICD-10-CM | POA: Insufficient documentation

## 2017-01-28 DIAGNOSIS — N3281 Overactive bladder: Secondary | ICD-10-CM | POA: Insufficient documentation

## 2017-01-28 DIAGNOSIS — H04123 Dry eye syndrome of bilateral lacrimal glands: Secondary | ICD-10-CM | POA: Insufficient documentation

## 2017-01-28 DIAGNOSIS — G4700 Insomnia, unspecified: Secondary | ICD-10-CM | POA: Insufficient documentation

## 2017-01-28 DIAGNOSIS — G2581 Restless legs syndrome: Secondary | ICD-10-CM | POA: Insufficient documentation

## 2017-01-28 HISTORY — DX: Postmenopausal atrophic vaginitis: N95.2

## 2017-01-28 NOTE — Progress Notes (Signed)
Location:    Nursing Home Room Number: 301B Place of Service:  SNF (31) Provider:  Toni Arthurs, NP-C  Leonel Ramsay, MD  Patient Care Team: Leonel Ramsay, MD as PCP - General (Infectious Diseases)  Extended Emergency Contact Information Primary Emergency Contact: Bazzano,Timothy L Address: [address]          [place], [place] [address] [name] of [place] Phone: [phone] Relation: Son Secondary Emergency Contact: Gastelum,James Address: [address]          [place], [place] [address] [name] of [place] Phone: [phone] Work Phone: [phone] Mobile Phone: [phone] Relation: Son  Code Status:  FULL Goals of care: Advanced Directive information Advanced Directives 01/13/2017  Does Patient Have a Medical Advance Directive? No  Type of Advance Directive -  Does patient want to make changes to medical advance directive? -  Copy of [place] in Chart? -     Chief Complaint  Patient presents with  . Medical Management of Chronic Issues    Routine Visit    HPI:  Pt is a 84 y.o. female seen today for medical management of chronic diseases. Pt has been stable over the past month. No reported falls this month. Pt does not participate in activities with other residents often. Ambulatory with walker. Appetite is fair. Pt is continent of B&B. Voiding regularly and having regular BMs. Pt denies pain aside from typical OA pain. VSS. No other complaints.   Past Medical History:  Diagnosis Date  . A-fib ([place])    unspecified  . Anemia    unspecified  . Arthritis    osteoarthritis  . Atrophic vaginitis   . Bleeding hemorrhoid   . Breast cancer ([place])    unspecified  . Cancer ([place])    Breast  . Cataract   . Diverticulosis   . DVT (deep vein thrombosis) in pregnancy ([place])   . Hiatal hernia   . Hypertension   . Incontinence   . Osteoarthritis   . Osteoporosis    a. Intolerance to Fosamax b. Bilateral foot  fractures c. IV Boniva d. Low vitamin D e. Reclast  . Parkinson disease ([place])   . PE (pulmonary thromboembolism) ([place])   . Scleritis    Idiopathic a. Prednisone b. s/p methotrexate  . TIA (transient ischemic attack) 1997   Possible  . Urinary urgency   . Vitamin B12 deficiency    Past Surgical History:  Procedure Laterality Date  . ABDOMINAL HYSTERECTOMY    . ABDOMINAL HYSTERECTOMY     partial  . BLADDER TACK SURGERY     Dr. Ouida Sills  . BREAST BIOPSY Left 10/2012   benign  . BREAST BIOPSY Left 11/22/2015   invasive mammary ca   . BREAST EXCISIONAL BIOPSY Left 12/08/2015   lumpectomy  . BREAST LUMPECTOMY Left 11/2015   Invasive mammary carcinoma Grade I  . EYE SURGERY Bilateral    Catarct Extraction with IOL  . FINGER SURGERY Right   . FOOT FRACTURE SURGERY Right 06/2003   pinning, Dr. Francia Greaves, [facility]  . FOOT SURGERY Left   . INCONTINENCE SURGERY    . NEUROMA SURGERY    . PACEMAKER INSERTION Right 10/02/2016   Procedure: INSERTION PACEMAKER;  Surgeon: Isaias Cowman, MD;  Location: [facility] ORS;  Service: Cardiovascular;  Laterality: Right;  . PARTIAL MASTECTOMY WITH AXILLARY SENTINEL LYMPH NODE BIOPSY Left 12/08/2015   Procedure: PARTIAL MASTECTOMY WITH AXILLARY SENTINEL LYMPH NODE BIOPSY;  Surgeon: [name], MD;  Location:  [facility] ORS;  Service: General;  Laterality: Left;    No Known Allergies  Allergies as of 01/13/2017   No Known Allergies     Medication List        Accurate as of 01/13/17 11:59 PM. Always use your most recent med list.          acetaminophen 500 MG tablet Commonly known as:  TYLENOL Take 500 mg by mouth every 6 (six) hours as needed (pain).   alum & mag hydroxide-simeth 409-811-91 MG/5ML suspension Commonly known as:  MAALOX PLUS Take 30 mLs every 4 (four) hours as needed by mouth for indigestion.   calcium carbonate 1500 (600 Ca) MG Tabs tablet Commonly known as:  OSCAL Take 2 tablets by mouth daily.   carbidopa-levodopa  50-200 MG tablet Commonly known as:  SINEMET CR Take 3 tablets by mouth 3 (three) times daily.   cholecalciferol 400 units Tabs tablet Commonly known as:  VITAMIN D Take 800 Units by mouth daily. Per cancer center office visit note on 11/27   CLEAR EYES COMPLETE Soln Place 2 drops every 2 (two) hours as needed into both eyes. For dry, irritated eyes. Ok to leave at bedside.   cyanocobalamin 1000 MCG/ML injection Commonly known as:  (VITAMIN B-12) INJECT 1ML INTO MUSCLE ONCE A MONTH AS DIRECTED   DERMACLOUD Crea Apply liberal amount to area of skin irritation topically as needed. Ok to leave at bedside.   diltiazem 120 MG tablet Commonly known as:  CARDIZEM Take 120 mg by mouth daily.   furosemide 40 MG tablet Commonly known as:  LASIX Take 40 mg by mouth daily.   Melatonin 3 MG Tabs Take 3 mg by mouth at bedtime.   mirabegron ER 25 MG Tb24 tablet Commonly known as:  MYRBETRIQ Take 25 mg by mouth daily.   potassium chloride SA 20 MEQ tablet Commonly known as:  K-DUR,KLOR-CON Take 20 mEq by mouth daily.   PREMARIN vaginal cream Generic drug:  conjugated estrogens Apply 0.5 mg ( pea-sized amount) just inside the vaginal introitus with finger-tip weekly on Monday nights   rOPINIRole 0.5 MG tablet Commonly known as:  REQUIP Take 1 tablet (0.5 mg total) by mouth at bedtime.   XARELTO 20 MG Tabs tablet Generic drug:  rivaroxaban Take 20 mg by mouth daily.       Review of Systems  Constitutional: Negative for activity change, appetite change, chills, diaphoresis and fever.  HENT: Negative for congestion, mouth sores, nosebleeds, postnasal drip, sneezing, sore throat, trouble swallowing and voice change.   Respiratory: Negative for apnea, cough, choking, chest tightness, shortness of breath (intermitttent) and wheezing.   Cardiovascular: Negative for chest pain, palpitations and leg swelling.  Gastrointestinal: Negative for abdominal distention, abdominal pain,  constipation, diarrhea and nausea.  Genitourinary: Negative for difficulty urinating, dysuria, frequency and urgency.  Musculoskeletal: Positive for arthralgias (typical arthritis). Negative for back pain, gait problem and myalgias.  Skin: Negative for color change, pallor, rash and wound.  Neurological: Positive for weakness. Negative for dizziness, tremors, syncope, speech difficulty, numbness and headaches.  Psychiatric/Behavioral: Negative for agitation and behavioral problems.  All other systems reviewed and are negative.   Immunization History  Administered Date(s) Administered  . Influenza-Unspecified 10/22/2016  . Pneumococcal Conjugate-13 11/18/2013   Pertinent  Health Maintenance Due  Topic Date Due  . PNA vac Low Risk Adult (2 of 2 - PPSV23) 11/19/2014  . INFLUENZA VACCINE  Completed  . DEXA SCAN  Completed   No flowsheet data found.  Functional Status Survey:    Vitals:   01/13/17 1308  BP: 110/78  Pulse: 69  Resp: 20  Temp: 97.9 F (36.6 C)  TempSrc: Oral  SpO2: 97%  Weight: 126 lb 4.8 oz (57.3 kg)  Height: 5' (1.524 m)   Body mass index is 24.67 kg/m. Physical Exam  Constitutional: She is oriented to person, place, and time. Vital signs are normal. She appears well-developed and well-nourished. She is active and cooperative. She does not appear ill. No distress.  HENT:  Head: Normocephalic and atraumatic.  Mouth/Throat: Uvula is midline, oropharynx is clear and moist and mucous membranes are normal. Mucous membranes are not pale, not dry and not cyanotic.  Eyes: Conjunctivae, EOM and lids are normal. Pupils are equal, round, and reactive to light.  Neck: Trachea normal, normal range of motion and full passive range of motion without pain. Neck supple. No JVD present. No tracheal deviation, no edema and no erythema present. No thyromegaly present.  Cardiovascular: Normal rate, regular rhythm, normal heart sounds, intact distal pulses and normal pulses. Exam  reveals no gallop, no distant heart sounds and no friction rub.  No murmur heard. Pulses:      Dorsalis pedis pulses are 2+ on the right side, and 2+ on the left side.  No edema  Pulmonary/Chest: Effort normal and breath sounds normal. No accessory muscle usage. No respiratory distress. She has no decreased breath sounds. She has no wheezes. She has no rhonchi. She has no rales. She exhibits no tenderness.  Abdominal: Soft. Normal appearance and bowel sounds are normal. She exhibits no distension and no ascites. There is no tenderness.  Musculoskeletal: Normal range of motion. She exhibits no edema or tenderness.  Expected osteoarthritis, stiffness  Neurological: She is alert and oriented to person, place, and time. She has normal strength. Coordination and gait abnormal.  Skin: Skin is warm, dry and intact. She is not diaphoretic. No cyanosis. No pallor. Nails show no clubbing.  Psychiatric: She has a normal mood and affect. Her speech is normal and behavior is normal. Judgment and thought content normal. Cognition and memory are normal.  Nursing note and vitals reviewed.   Labs reviewed: Recent Labs    06/20/16 0552  10/10/16 0620 12/24/16 1144 01/23/17 1300  NA 136   < > 139 137 137  K 3.5   < > 3.8 3.5 4.2  CL 102   < > 100* 98* 102  CO2 28   < > 31 29 28   GLUCOSE 125*   < > 97 112* 95  BUN 16   < > 14 13 13   CREATININE 0.45   < > 0.57 0.62 0.57  CALCIUM 7.6*   < > 8.8* 8.8* 8.9  MG 1.7  --   --   --   --    < > = values in this interval not displayed.   Recent Labs    06/18/16 1956 09/16/16 1410 12/24/16 1144  AST 18 19 17   ALT <5* 6* 5*  ALKPHOS 40 50 73  BILITOT 0.8 0.5 0.7  PROT 6.4* 7.4 6.9  ALBUMIN 3.2* 3.8 3.6   Recent Labs    06/18/16 1956  09/16/16 1410  10/03/16 0413 10/04/16 0416 12/24/16 1144  WBC 3.8   < > 9.2   < > 12.7* 11.9* 7.3  NEUTROABS 1.7  --  4.3  --   --   --  4.2  HGB 11.4*   < > 13.0   < >  12.4 12.2 13.1  HCT 34.7*   < > 38.9   < >  37.7 36.8 39.5  MCV 90.6   < > 94.6   < > 95.1 95.7 89.8  PLT 335   < > 299   < > 240 220 298   < > = values in this interval not displayed.   Lab Results  Component Value Date   TSH 2.420 09/30/2016   No results found for: HGBA1C No results found for: CHOL, HDL, LDLCALC, LDLDIRECT, TRIG, CHOLHDL  Significant Diagnostic Results in last 30 days:  No results found.  Assessment/Plan 1. Parkinson disease (HCC)  Stable   Continue carbidopa-levodopa CR 50-200 3 tablets po TID  2. AF (paroxysmal atrial fibrillation) (HCC)  Stable  Continue Cardizem 120 mg po Q Day  Continue Xarelto 20 mg po Q Day  3. Sick sinus syndrome (HCC)  Stable   4. Hypertensive heart disease with heart failure ([place]) 5. Chronic diastolic (congestive) heart failure (HCC)  Stable  Continue Lasix 40 mg po Q Day  Continue Potassium Chloride 20 meq po Q Day  TED Hose- on in the early am, off in the pm  6. PE (pulmonary thromboembolism) ([place]) 7. Deep vein thrombosis (DVT) of distal vein of right lower extremity, unspecified chronicity (HCC)  Stable  Continue Xarelto 20 mg po Q Day  8. Restless leg syndrome  Stable  Continue Requip  Mg PO Q HS  9. Deficiency of other specified B group vitamins  Stable  Continue Cyanocobalamin 1,000 mcg IM Once a month  10. Age-related osteoporosis without current pathological fracture  Stable  Continue Calcium Carbonate 1500 mg 2 tablets po Q Day  Continue Cholecalciferol 800 units po Q Day  11. Post-menopausal atrophic vaginitis  Stable  Premarin Vaginal cream 0.5 mg Q week  12. Personal history of malignant neoplasm of breast  Stable   13. Hormone replacement therapy, postmenopausal  Topical  14. Overactive bladder  Stable  Continue Myrbetriq ER 25 mg po Q Day  15. Dysphagia, oropharyngeal phase  Stable  Regular diet with Chopped Meats  16. Weakness generalized  Continue Restorative Nursing program   Ambulate with  walker  17. Dry eyes  Stable  Continue Clear Eyes 2 drops in each eye Q 2 hours prn   18. Insomnia  Stable  Continue Melatonin 3 mg po Q HS  Continue all medications as listed above (01/13/2017)  Family/ staff Communication:  Total Time:  Documentation:  Face to Face:  Family/Phone:   Labs/tests ordered:  Not due  Medication list reviewed and assessed for continued appropriateness. Monthly medication orders reviewed and signed.  Vikki Ports, NP-C Geriatrics [facility] [phone], [place] [address] Cell Phone (Mon-Fri 8am-5pm):  [phone] On Call:  [phone] & follow prompts after 5pm & weekends Office Phone:  [phone] Office Fax:  [phone]

## 2017-01-28 NOTE — Progress Notes (Signed)
Location:    Nursing Home Room Number: 301B Place of Service:  SNF (31) Provider:  Toni Arthurs, NP-C  Leonel Ramsay, MD  Patient Care Team: Leonel Ramsay, MD as PCP - General (Infectious Diseases)  Extended Emergency Contact Information Primary Emergency Contact: Branch,Timothy L Address: [address]          [place], [place] [address] [name] of [place] Phone: [phone] Relation: Son Secondary Emergency Contact: Polan,James Address: [address]          [place], [place] [address] [name] of [place] Phone: [phone] Work Phone: [phone] Mobile Phone: [phone] Relation: Son  Code Status:  FULL Goals of care: Advanced Directive information Advanced Directives 01/13/2017  Does Patient Have a Medical Advance Directive? No  Type of Advance Directive -  Does patient want to make changes to medical advance directive? -  Copy of [place] in Chart? -     Chief Complaint  Patient presents with  . Medical Management of Chronic Issues    Routine Visit    HPI:  Pt is a 84 y.o. female seen today for medical management of chronic diseases. Pt has been stable over the past month. No reported falls this month. Pt does not participate in activities with other residents often. Ambulatory with walker. Appetite is fair. Pt is continent of B&B. Voiding regularly and having regular BMs. Pt denies pain. VSS. No other complaints.      Past Medical History:  Diagnosis Date  . A-fib ([place])    unspecified  . Anemia    unspecified  . Arthritis    osteoarthritis  . Atrophic vaginitis   . Bleeding hemorrhoid   . Breast cancer ([place])    unspecified  . Cancer ([place])    Breast  . Cataract   . Diverticulosis   . DVT (deep vein thrombosis) in pregnancy ([place])   . Hiatal hernia   . Hypertension   . Incontinence   . Osteoarthritis   . Osteoporosis    a. Intolerance to Fosamax b. Bilateral foot fractures c. IV Boniva d. Low  vitamin D e. Reclast  . Parkinson disease ([place])   . PE (pulmonary thromboembolism) ([place])   . Scleritis    Idiopathic a. Prednisone b. s/p methotrexate  . TIA (transient ischemic attack) 1997   Possible  . Urinary urgency   . Vitamin B12 deficiency    Past Surgical History:  Procedure Laterality Date  . ABDOMINAL HYSTERECTOMY    . ABDOMINAL HYSTERECTOMY     partial  . BLADDER TACK SURGERY     Dr. Ouida Sills  . BREAST BIOPSY Left 10/2012   benign  . BREAST BIOPSY Left 11/22/2015   invasive mammary ca   . BREAST EXCISIONAL BIOPSY Left 12/08/2015   lumpectomy  . BREAST LUMPECTOMY Left 11/2015   Invasive mammary carcinoma Grade I  . EYE SURGERY Bilateral    Catarct Extraction with IOL  . FINGER SURGERY Right   . FOOT FRACTURE SURGERY Right 06/2003   pinning, Dr[facility] Francia [facility]aves, [facility]  . FOOT SURGERY Left   . INCONTINENCE SURGERY    . NEUROMA SURGERY    . PACEMAKER INSERTION Right 10/02/2016   Procedure: INSERTION PACEMAKER;  Surgeon: Isaias Cowman, MD;  Location: [facility] ORS;  Service: Cardiovascular;  Laterality: Right;  . PARTIAL MASTECTOMY WITH AXILLARY SENTINEL LYMPH NODE BIOPSY Left 12/08/2015   Procedure: PARTIAL MASTECTOMY WITH AXILLARY SENTINEL LYMPH NODE BIOPSY;  Surgeon: [name], MD;  Location: [facility] ORS;  Service: General;  Laterality: Left;    No Known Allergies  Allergies as of 12/09/2016   No Known Allergies     Medication List        Accurate as of 12/09/16 11:59 PM. Always use your most recent med list.          acetaminophen 500 MG tablet Commonly known as:  TYLENOL Take 500 mg by mouth every 6 (six) hours as needed (pain).   alum & mag hydroxide-simeth 465-681-27 MG/5ML suspension Commonly known as:  MAALOX PLUS Take 30 mLs every 4 (four) hours as needed by mouth for indigestion.   carbidopa-levodopa 50-200 MG tablet Commonly known as:  SINEMET CR Take 3 tablets by mouth 3 (three) times daily.   CLEAR EYES COMPLETE Soln Place 2  drops every 2 (two) hours as needed into both eyes. For dry, irritated eyes. Ok to leave at bedside.   cyanocobalamin 1000 MCG/ML injection Commonly known as:  (VITAMIN B-12) INJECT 1ML INTO MUSCLE ONCE A MONTH AS DIRECTED   DERMACLOUD Crea Apply liberal amount to area of skin irritation topically as needed. Ok to leave at bedside.   diltiazem 120 MG tablet Commonly known as:  CARDIZEM Take 120 mg by mouth daily.   furosemide 40 MG tablet Commonly known as:  LASIX Take 40 mg by mouth daily.   hydroxypropyl methylcellulose / hypromellose 2.5 % ophthalmic solution Commonly known as:  ISOPTO TEARS / GONIOVISC Place 1 drop into both eyes 3 (three) times daily as needed for dry eyes.   Melatonin 3 MG Tabs Take 3 mg by mouth at bedtime.   mirabegron ER 25 MG Tb24 tablet Commonly known as:  MYRBETRIQ Take 25 mg by mouth daily.   potassium chloride 10 MEQ tablet Commonly known as:  K-DUR,KLOR-CON Take 10 mEq by mouth daily.   PREMARIN vaginal cream Generic drug:  conjugated estrogens Apply 0.5 mg ( pea-sized amount) just inside the vaginal introitus with finger-tip weekly on Monday nights   rOPINIRole 0.5 MG tablet Commonly known as:  REQUIP Take 1 tablet (0.5 mg total) by mouth at bedtime.   XARELTO 20 MG Tabs tablet Generic drug:  rivaroxaban Take 20 mg by mouth daily.       Review of Systems  Constitutional: Negative for activity change, appetite change, chills, diaphoresis and fever.  HENT: Negative for congestion, sneezing, sore throat, trouble swallowing and voice change.   Respiratory: Negative for apnea, cough, choking, chest tightness, shortness of breath (intermitttent) and wheezing.   Cardiovascular: Negative for chest pain, palpitations and leg swelling.  Gastrointestinal: Negative for abdominal distention, abdominal pain, constipation, diarrhea and nausea.  Genitourinary: Negative for difficulty urinating, dysuria, frequency and urgency.  Musculoskeletal:  Positive for arthralgias (typical arthritis). Negative for back pain, gait problem and myalgias.  Skin: Negative for color change, pallor, rash and wound.  Neurological: Positive for weakness. Negative for dizziness, tremors, syncope, speech difficulty, numbness and headaches.  Psychiatric/Behavioral: Negative for agitation and behavioral problems.  All other systems reviewed and are negative.   Immunization History  Administered Date(s) Administered  . Influenza-Unspecified 10/22/2016  . Pneumococcal Conjugate-13 11/18/2013   Pertinent  Health Maintenance Due  Topic Date Due  . PNA vac Low Risk Adult (2 of 2 - PPSV23) 11/19/2014  . INFLUENZA VACCINE  Completed  . DEXA SCAN  Completed   No flowsheet data found. Functional Status Survey:    Vitals:   12/09/16 1023  BP: (!) 87/49  Pulse: 98  Resp: 16  Temp: 97.6  F (36.4 C)  TempSrc: Oral  SpO2: 96%  Weight: 123 lb 1.6 oz (55.8 kg)  Height: 5' (1.524 m)   Body mass index is 24.04 kg/m. Physical Exam  Constitutional: She is oriented to person, place, and time. Vital signs are normal. She appears well-developed and well-nourished. She is active and cooperative. She does not appear ill. No distress.  HENT:  Head: Normocephalic and atraumatic.  Mouth/Throat: Uvula is midline, oropharynx is clear and moist and mucous membranes are normal. Mucous membranes are not pale, not dry and not cyanotic.  Eyes: Conjunctivae, EOM and lids are normal. Pupils are equal, round, and reactive to light.  Neck: Trachea normal, normal range of motion and full passive range of motion without pain. Neck supple. No JVD present. No tracheal deviation, no edema and no erythema present. No thyromegaly present.  Cardiovascular: Normal rate, regular rhythm, normal heart sounds, intact distal pulses and normal pulses. Exam reveals no gallop, no distant heart sounds and no friction rub.  No murmur heard. Pulses:      Dorsalis pedis pulses are 2+ on the  right side, and 2+ on the left side.  No edema  Pulmonary/Chest: Effort normal and breath sounds normal. No accessory muscle usage. No respiratory distress. She has no decreased breath sounds. She has no wheezes. She has no rhonchi. She has no rales. She exhibits no tenderness.  Abdominal: Soft. Normal appearance and bowel sounds are normal. She exhibits no distension and no ascites. There is no tenderness.  Musculoskeletal: Normal range of motion. She exhibits no edema or tenderness.  Expected osteoarthritis, stiffness  Neurological: She is alert and oriented to person, place, and time. She has normal strength. Coordination and gait abnormal.  Skin: Skin is warm, dry and intact. She is not diaphoretic. No cyanosis. No pallor. Nails show no clubbing.  Psychiatric: She has a normal mood and affect. Her speech is normal and behavior is normal. Judgment and thought content normal. Cognition and memory are normal.  Nursing note and vitals reviewed.   Labs reviewed: Recent Labs    06/20/16 0552  10/10/16 0620 12/24/16 1144 01/23/17 1300  NA 136   < > 139 137 137  K 3.5   < > 3.8 3.5 4.2  CL 102   < > 100* 98* 102  CO2 28   < > '31 29 28  '$ GLUCOSE 125*   < > 97 112* 95  BUN 16   < > '14 13 13  '$ CREATININE 0.45   < > 0.57 0.62 0.57  CALCIUM 7.6*   < > 8.8* 8.8* 8.9  MG 1.7  --   --   --   --    < > = values in this interval not displayed.   Recent Labs    06/18/16 1956 09/16/16 1410 12/24/16 1144  AST '18 19 17  '$ ALT <5* 6* 5*  ALKPHOS 40 50 73  BILITOT 0.8 0.5 0.7  PROT 6.4* 7.4 6.9  ALBUMIN 3.2* 3.8 3.6   Recent Labs    06/18/16 1956  09/16/16 1410  10/03/16 0413 10/04/16 0416 12/24/16 1144  WBC 3.8   < > 9.2   < > 12.7* 11.9* 7.3  NEUTROABS 1.7  --  4.3  --   --   --  4.2  HGB 11.4*   < > 13.0   < > 12.4 12.2 13.1  HCT 34.7*   < > 38.9   < > 37.7 36.8 39.5  MCV 90.6   < >  94.6   < > 95.1 95.7 89.8  PLT 335   < > 299   < > 240 220 298   < > = values in this interval not  displayed.   Lab Results  Component Value Date   TSH 2.420 09/30/2016   No results found for: HGBA1C No results found for: CHOL, HDL, LDLCALC, LDLDIRECT, TRIG, CHOLHDL  Significant Diagnostic Results in last 30 days:  No results found.  Assessment/Plan 1. Parkinson disease (HCC)  Stable   Continue carbidopa-levodopa CR 50-200 3 tablets po TID  2. AF (paroxysmal atrial fibrillation) (HCC)  Stable  Continue Cardizem 120 mg po Q Day  Continue Xarelto 20 mg po Q Day  3. Sick sinus syndrome (HCC)  Stable   4. Hypertensive heart disease with heart failure ([place]) 5. Chronic diastolic (congestive) heart failure (HCC)  Stable  Continue Lasix 40 mg po Q Day  Continue Potassium Chloride 20 meq po Q Day  TED Hose- on in the early am, off in the pm  6. PE (pulmonary thromboembolism) ([place]) 7. Deep vein thrombosis (DVT) of distal vein of right lower extremity, unspecified chronicity (HCC)  Stable  Continue Xarelto 20 mg po Q Day  8. Restless leg syndrome  Stable  Continue Requip  Mg PO Q HS  9. Deficiency of other specified B group vitamins  Stable  Continue Cyanocobalamin 1,000 mcg IM Once a month  10. Age-related osteoporosis without current pathological fracture  Stable  Continue Calcium Carbonate 1500 mg 2 tablets po Q Day  Continue Cholecalciferol 800 units po Q Day  11. Post-menopausal atrophic vaginitis  Stable  Premarin Vaginal cream 0.5 mg Q week  12. Personal history of malignant neoplasm of breast  Stable   13. Hormone replacement therapy, postmenopausal  Topical  14. Overactive bladder  Stable  Continue Myrbetriq ER 25 mg po Q Day  15. Dysphagia, oropharyngeal phase  Stable  Regular diet with Chopped Meats  16. Weakness generalized  Continue Restorative Nursing program   Ambulate with walker  17. Dry eyes  Stable  Continue Clear Eyes 2 drops in each eye Q 2 hours prn   18. Insomnia  Stable  Continue Melatonin 3  mg po Q HS   Family/ staff Communication:  Total Time:  Documentation:  Face to Face:  Family/Phone:   Labs/tests ordered:  CBC, MET C  Medication list reviewed and assessed for continued appropriateness. Monthly medication orders reviewed and signed.  Vikki Ports, NP-C Geriatrics [facility] [phone],  [id number] Cell Phone (Mon-Fri 8am-5pm):  [phone] On Call:  [phone] & follow prompts after 5pm & weekends Office Phone:  [phone] Office Fax:  [phone]

## 2017-02-10 NOTE — Progress Notes (Signed)
02/11/2017 3:26 PM   Laura Cisneros 08-16-1925 [id number]  Referring provider: Leonel Ramsay, MD [address] [place], [place] [address]  Chief Complaint  Patient presents with  . Urinary Incontinence    HPI: Patient is an 84 year old Caucasian female who presents today for a one year follow up for incontinence, vaginal atrophy and a history of recurrent UTI.      Mixed incontinence The patient has been experiencing  is engaging in toilet mapping, incontinence x 0-3 and nocturia x 0-3.   Her PVR is 83 mL.  Using 2 pads daily.  She contributing most of the leakage to the staff's inability to get to her to help her to the restroom.    Vaginal atrophy She is applying the cream once weekly.    History of recurrent UTI's Her symptoms with a urinary tract infection consist of frequency, dysuria, urgency, low back pain and incontinence.  She denies gross hematuria, suprapubic pain, abdominal pain or flank pain.  She has not had any recent fevers, chills, nausea or vomiting.   She does not have a history of nephrolithiasis, GU surgery or GU trauma.  She has not had any UTI's since her visit with [place] last year.    PMH: Past Medical History:  Diagnosis Date  . A-fib ([place])    unspecified  . Anemia    unspecified  . Arthritis    osteoarthritis  . Atrophic vaginitis   . Bleeding hemorrhoid   . Breast cancer ([place])    unspecified  . Cancer ([place])    Breast  . Cataract   . Diverticulosis   . DVT (deep vein thrombosis) in pregnancy ([place])   . Hiatal hernia   . Hypertension   . Incontinence   . Osteoarthritis   . Osteoporosis    a. Intolerance to Fosamax b. Bilateral foot fractures c. IV Boniva d. Low vitamin D e. Reclast  . Parkinson disease ([place])   . PE (pulmonary thromboembolism) ([place])   . Scleritis    Idiopathic a. Prednisone b. s/p methotrexate  . TIA (transient ischemic attack) 1997   Possible  . Urinary urgency   . Vitamin B12 deficiency     Surgical  History: Past Surgical History:  Procedure Laterality Date  . ABDOMINAL HYSTERECTOMY    . ABDOMINAL HYSTERECTOMY     partial  . BLADDER TACK SURGERY     Dr. Ouida Sills  . BREAST BIOPSY Left 10/2012   benign  . BREAST BIOPSY Left 11/22/2015   invasive mammary ca   . BREAST EXCISIONAL BIOPSY Left 12/08/2015   lumpectomy  . BREAST LUMPECTOMY Left 11/2015   Invasive mammary carcinoma Grade I  . EYE SURGERY Bilateral    Catarct Extraction with IOL  . FINGER SURGERY Right   . FOOT FRACTURE SURGERY Right 06/2003   pinning, Dr. Francia Greaves, [facility]  . FOOT SURGERY Left   . INCONTINENCE SURGERY    . NEUROMA SURGERY    . PACEMAKER INSERTION Right 10/02/2016   Procedure: INSERTION PACEMAKER;  Surgeon: Isaias Cowman, MD;  Location: [facility] ORS;  Service: Cardiovascular;  Laterality: Right;  . PARTIAL MASTECTOMY WITH AXILLARY SENTINEL LYMPH NODE BIOPSY Left 12/08/2015   Procedure: PARTIAL MASTECTOMY WITH AXILLARY SENTINEL LYMPH NODE BIOPSY;  Surgeon: [name], MD;  Location: [facility] ORS;  Service: General;  Laterality: Left;    Home Medications:  Allergies as of 02/11/2017   No Known Allergies     Medication List  Accurate as of 02/11/17  3:26 PM. Always use your most recent med list.          acetaminophen 500 MG tablet Commonly known as:  TYLENOL Take 500 mg by mouth every 6 (six) hours as needed (pain).   alum & mag hydroxide-simeth 250-539-76 MG/5ML suspension Commonly known as:  MAALOX PLUS Take 30 mLs every 4 (four) hours as needed by mouth for indigestion.   calcium carbonate 1500 (600 Ca) MG Tabs tablet Commonly known as:  OSCAL Take 2 tablets by mouth daily.   carbidopa-levodopa 50-200 MG tablet Commonly known as:  SINEMET CR Take 3 tablets by mouth 3 (three) times daily.   cholecalciferol 400 units Tabs tablet Commonly known as:  VITAMIN D Take 800 Units by mouth daily. Per cancer center office visit note on 11/27   CLEAR EYES COMPLETE Soln Place 2  drops every 2 (two) hours as needed into both eyes. For dry, irritated eyes. Ok to leave at bedside.   cyanocobalamin 1000 MCG/ML injection Commonly known as:  (VITAMIN B-12) INJECT 1ML INTO MUSCLE ONCE A MONTH AS DIRECTED   DERMACLOUD Crea Apply liberal amount to area of skin irritation topically as needed. Ok to leave at bedside.   diltiazem 120 MG tablet Commonly known as:  CARDIZEM Take 120 mg by mouth daily.   furosemide 40 MG tablet Commonly known as:  LASIX Take 40 mg by mouth daily.   Melatonin 3 MG Tabs Take 3 mg by mouth at bedtime.   mirabegron ER 25 MG Tb24 tablet Commonly known as:  MYRBETRIQ Take 25 mg by mouth daily.   potassium chloride SA 20 MEQ tablet Commonly known as:  K-DUR,KLOR-CON Take 20 mEq by mouth daily.   PREMARIN vaginal cream Generic drug:  conjugated estrogens Apply 0.5 mg ( pea-sized amount) just inside the vaginal introitus with finger-tip weekly on Monday nights   rOPINIRole 0.5 MG tablet Commonly known as:  REQUIP Take 1 tablet (0.5 mg total) by mouth at bedtime.   XARELTO 20 MG Tabs tablet Generic drug:  rivaroxaban Take 20 mg by mouth daily.       Allergies: No Known Allergies  Family History: Family History  Problem Relation Age of Onset  . Esophageal cancer Sister   . Kidney disease Neg Hx   . Bladder Cancer Neg Hx   . Breast cancer Neg Hx     Social History:  reports that  has never smoked. she has never used smokeless tobacco. She reports that she does not drink alcohol or use drugs.  ROS: UROLOGY Frequent Urination?: No Hard to postpone urination?: No Burning/pain with urination?: No Get up at night to urinate?: No Leakage of urine?: No Urine stream starts and stops?: No Trouble starting stream?: No Do you have to strain to urinate?: No Blood in urine?: No Urinary tract infection?: No Sexually transmitted disease?: No Injury to kidneys or bladder?: No Painful intercourse?: No Weak stream?: No Currently  pregnant?: No Vaginal bleeding?: No Last menstrual period?: n  Gastrointestinal Nausea?: No Vomiting?: No Indigestion/heartburn?: No Diarrhea?: No Constipation?: No  Constitutional Fever: No Night sweats?: No Weight loss?: No Fatigue?: No  Skin Skin rash/lesions?: No Itching?: No  Eyes Blurred vision?: No Double vision?: No  Ears/Nose/Throat Sore throat?: No Sinus problems?: No  Hematologic/Lymphatic Swollen glands?: No Easy bruising?: No  Cardiovascular Leg swelling?: No Chest pain?: No  Respiratory Cough?: No Shortness of breath?: No  Endocrine Excessive thirst?: No  Musculoskeletal Back pain?: No Joint pain?: No  Neurological Headaches?: No Dizziness?: No  Psychologic Depression?: No Anxiety?: No  Physical Exam: BP 122/77 (BP Location: Right Arm, Patient Position: Sitting, Cuff Size: Normal)   Pulse 71   Ht 5' (1.524 m)   Wt 122 lb (55.3 kg)   BMI 23.83 kg/m   Constitutional: Well nourished. Alert and oriented, No acute distress. HEENT: [place] AT, moist mucus membranes. Trachea midline, no masses. Cardiovascular: No clubbing, cyanosis, or edema. Respiratory: Normal respiratory effort, no increased work of breathing. Skin: No rashes, bruises or suspicious lesions. Lymph: No cervical or inguinal adenopathy. Neurologic: Grossly intact, no focal deficits, moving all 4 extremities. Psychiatric: Normal mood and affect.  Laboratory Data: Lab Results  Component Value Date   WBC 7.3 12/24/2016   HGB 13.1 12/24/2016   HCT 39.5 12/24/2016   MCV 89.8 12/24/2016   PLT 298 12/24/2016    Lab Results  Component Value Date   CREATININE 0.57 01/23/2017    Lab Results  Component Value Date   AST 17 12/24/2016   Lab Results  Component Value Date   ALT 5 (L) 12/24/2016   I have reviewed the labs.  Pertinent Imaging: Results for CARRY, WEESNER (MRN [id number]) as of 02/11/2017 15:24  Ref. Range 02/11/2017 15:04  Scan Result Unknown 83     Assessment & Plan:    1. Recurrent UTI's  - reviewed UTI prevention  - remind patient to contact our office when she has symptoms of an UTI  2. Incontinence  - PVR was 83 mL  - continue estrogen cream; once weekly  - continue Myrbetriq 25 mg daily  - present to clinic in 12 months for PVR and symptom recheck   3. Vaginal atrophy  - continue applying the cream once weekly  - RTC in 12 months for an exam  Return in about 1 year (around 02/11/2018) for OAB questionnaire, PVR and exam.  These notes generated with voice recognition software. I apologize for typographical errors.  Zara Council, [facility] [address], [place], [place] [address] [phone]

## 2017-02-11 ENCOUNTER — Encounter: Payer: Self-pay | Admitting: Urology

## 2017-02-11 ENCOUNTER — Ambulatory Visit (INDEPENDENT_AMBULATORY_CARE_PROVIDER_SITE_OTHER): Payer: Medicare Other | Admitting: Urology

## 2017-02-11 VITALS — BP 122/77 | HR 71 | Ht 60.0 in | Wt 122.0 lb

## 2017-02-11 DIAGNOSIS — N390 Urinary tract infection, site not specified: Secondary | ICD-10-CM

## 2017-02-11 DIAGNOSIS — N952 Postmenopausal atrophic vaginitis: Secondary | ICD-10-CM

## 2017-02-11 DIAGNOSIS — N3946 Mixed incontinence: Secondary | ICD-10-CM

## 2017-02-11 DIAGNOSIS — R32 Unspecified urinary incontinence: Secondary | ICD-10-CM | POA: Diagnosis not present

## 2017-02-11 LAB — BLADDER SCAN AMB NON-IMAGING: Scan Result: 83

## 2017-02-21 ENCOUNTER — Encounter: Payer: Self-pay | Admitting: Gerontology

## 2017-02-21 ENCOUNTER — Non-Acute Institutional Stay (SKILLED_NURSING_FACILITY): Payer: Medicare Other | Admitting: Gerontology

## 2017-02-21 DIAGNOSIS — I110 Hypertensive heart disease with heart failure: Secondary | ICD-10-CM

## 2017-02-21 DIAGNOSIS — I2699 Other pulmonary embolism without acute cor pulmonale: Secondary | ICD-10-CM

## 2017-02-21 DIAGNOSIS — I495 Sick sinus syndrome: Secondary | ICD-10-CM

## 2017-02-28 ENCOUNTER — Encounter
Admission: RE | Admit: 2017-02-28 | Discharge: 2017-02-28 | Disposition: A | Discharge status: Home / Self Care | Payer: Medicare Other | Source: Ambulatory Visit | Attending: Internal Medicine | Admitting: Internal Medicine

## 2017-03-03 NOTE — Assessment & Plan Note (Signed)
Stable. No recent episodes of weakness or syncope

## 2017-03-03 NOTE — Assessment & Plan Note (Signed)
Stable. Pressures controlled. No edema. Symptoms managed with Lasix and potassium. Pt denies chest pain or shortness of breath

## 2017-03-03 NOTE — Assessment & Plan Note (Signed)
Stable. No symptoms. No c/o chest pain or shortness of breath. Pt continues on Xarelto for DVT prophylaxis

## 2017-03-03 NOTE — Progress Notes (Signed)
Location:    Nursing Home Room Number: 025E Place of Service:  SNF (31) Provider:  Toni Arthurs, NP-C  Leonel Ramsay, MD  Patient Care Team: Leonel Ramsay, MD as PCP - General (Infectious Diseases)  Extended Emergency Contact Information Primary Emergency Contact: Costley,Timothy L Address: [address]          [place], [place] [address] [name] of [place] Phone: [phone] Relation: Son Secondary Emergency Contact: Gauna,James Address: [address]          [place], [place] [address] [name] of [place] Phone: [phone] Work Phone: [phone] Mobile Phone: [phone] Relation: Son  Code Status:  FULL Goals of care: Advanced Directive information Advanced Directives 02/21/2017  Does Patient Have a Medical Advance Directive? No  Type of Advance Directive -  Does patient want to make changes to medical advance directive? -  Copy of [place] in Chart? -     Chief Complaint  Patient presents with  . Medical Management of Chronic Issues    Routine Visit    HPI:  Pt is a 84 y.o. female seen today for medical management of chronic diseases.    Sick sinus syndrome (HCC) Stable. No recent episodes of weakness or syncope  PE (pulmonary thromboembolism) (HCC) Stable. No symptoms. No c/o chest pain or shortness of breath. Pt continues on Xarelto for DVT prophylaxis  Hypertensive heart disease with heart failure (HCC) Stable. Pressures controlled. No edema. Symptoms managed with Lasix and potassium. Pt denies chest pain or shortness of breath    Past Medical History:  Diagnosis Date  . A-fib ([place])    unspecified  . Anemia    unspecified  . Arthritis    osteoarthritis  . Atrophic vaginitis   . Bleeding hemorrhoid   . Breast cancer ([place])    unspecified  . Cancer ([place])    Breast  . Cataract   . Diverticulosis   . DVT (deep vein thrombosis) in pregnancy ([place])   . Hiatal hernia   . Hypertension   .  Incontinence   . Osteoarthritis   . Osteoporosis    a. Intolerance to Fosamax b. Bilateral foot fractures c. IV Boniva d. Low vitamin D e. Reclast  . Parkinson disease ([place])   . PE (pulmonary thromboembolism) ([place])   . Scleritis    Idiopathic a. Prednisone b. s/p methotrexate  . TIA (transient ischemic attack) 1997   Possible  . Urinary urgency   . Vitamin B12 deficiency    Past Surgical History:  Procedure Laterality Date  . ABDOMINAL HYSTERECTOMY    . ABDOMINAL HYSTERECTOMY     partial  . BLADDER TACK SURGERY     Dr. Ouida Sills  . BREAST BIOPSY Left 10/2012   benign  . BREAST BIOPSY Left 11/22/2015   invasive mammary ca   . BREAST EXCISIONAL BIOPSY Left 12/08/2015   lumpectomy  . BREAST LUMPECTOMY Left 11/2015   Invasive mammary carcinoma Grade I  . EYE SURGERY Bilateral    Catarct Extraction with IOL  . FINGER SURGERY Right   . FOOT FRACTURE SURGERY Right 06/2003   pinning, Dr. Francia Greaves, [facility]  . FOOT SURGERY Left   . INCONTINENCE SURGERY    . NEUROMA SURGERY    . PACEMAKER INSERTION Right 10/02/2016   Procedure: INSERTION PACEMAKER;  Surgeon: Isaias Cowman, MD;  Location: [facility] ORS;  Service: Cardiovascular;  Laterality: Right;  . PARTIAL MASTECTOMY WITH AXILLARY SENTINEL LYMPH NODE BIOPSY Left 12/08/2015   Procedure: PARTIAL MASTECTOMY  WITH AXILLARY SENTINEL LYMPH NODE BIOPSY;  Surgeon: [name], MD;  Location: [facility] ORS;  Service: General;  Laterality: Left;    No Known Allergies  Allergies as of 02/21/2017   No Known Allergies     Medication List        Accurate as of 02/21/17 11:59 PM. Always use your most recent med list.          acetaminophen 500 MG tablet Commonly known as:  TYLENOL Take 500 mg by mouth every 6 (six) hours as needed (pain).   alum & mag hydroxide-simeth 270-623-76 MG/5ML suspension Commonly known as:  MAALOX PLUS Take 30 mLs every 4 (four) hours as needed by mouth for indigestion.   calcium carbonate 1500 (600 Ca)  MG Tabs tablet Commonly known as:  OSCAL Take 2 tablets by mouth daily.   carbidopa-levodopa 50-200 MG tablet Commonly known as:  SINEMET CR Take 3 tablets by mouth 3 (three) times daily.   cholecalciferol 400 units Tabs tablet Commonly known as:  VITAMIN D Take 800 Units by mouth daily. Per cancer center office visit note on 11/27   CLEAR EYES COMPLETE Soln Place 2 drops every 2 (two) hours as needed into both eyes. For dry, irritated eyes. Ok to leave at bedside.   cyanocobalamin 1000 MCG/ML injection Commonly known as:  (VITAMIN B-12) INJECT 1ML INTO MUSCLE ONCE A MONTH AS DIRECTED   DERMACLOUD Crea Apply liberal amount to area of skin irritation topically as needed. Ok to leave at bedside.   diltiazem 120 MG tablet Commonly known as:  CARDIZEM Take 120 mg by mouth daily.   furosemide 40 MG tablet Commonly known as:  LASIX Take 40 mg by mouth daily.   latanoprost 0.005 % ophthalmic solution Commonly known as:  XALATAN Place 1 drop into both eyes at bedtime. Per Dr. Wallace Going   Melatonin 3 MG Tabs Take 3 mg by mouth at bedtime.   mirabegron ER 25 MG Tb24 tablet Commonly known as:  MYRBETRIQ Take 25 mg by mouth daily.   potassium chloride SA 20 MEQ tablet Commonly known as:  K-DUR,KLOR-CON Take 20 mEq by mouth daily.   PREMARIN vaginal cream Generic drug:  conjugated estrogens Apply 0.5 mg ( pea-sized amount) just inside the vaginal introitus with finger-tip weekly on Monday nights   rOPINIRole 0.5 MG tablet Commonly known as:  REQUIP Take 1 tablet (0.5 mg total) by mouth at bedtime.   XARELTO 20 MG Tabs tablet Generic drug:  rivaroxaban Take 20 mg by mouth daily.       Review of Systems  Constitutional: Negative for activity change, appetite change, chills, diaphoresis and fever.  HENT: Negative for congestion, mouth sores, nosebleeds, postnasal drip, sneezing, sore throat, trouble swallowing and voice change.   Respiratory: Negative for apnea, cough,  choking, chest tightness, shortness of breath and wheezing.   Cardiovascular: Negative for chest pain, palpitations and leg swelling.  Gastrointestinal: Negative for abdominal distention, abdominal pain, constipation, diarrhea and nausea.  Genitourinary: Negative for difficulty urinating, dysuria, frequency and urgency.  Musculoskeletal: Positive for arthralgias (typical arthritis) and gait problem. Negative for back pain and myalgias.  Skin: Negative for color change, pallor, rash and wound.  Neurological: Positive for weakness. Negative for dizziness, tremors, syncope, speech difficulty, numbness and headaches.  Psychiatric/Behavioral: Negative for agitation and behavioral problems.  All other systems reviewed and are negative.   Immunization History  Administered Date(s) Administered  . Influenza-Unspecified 10/22/2016  . Pneumococcal Conjugate-13 11/18/2013   Pertinent  Health  Maintenance Due  Topic Date Due  . PNA vac Low Risk Adult (2 of 2 - PPSV23) 11/19/2014  . INFLUENZA VACCINE  Completed  . DEXA SCAN  Completed   No flowsheet data found. Functional Status Survey:    Vitals:   02/21/17 1314  BP: 122/77  Pulse: 98  Resp: 20  Temp: 97.9 F (36.6 C)  TempSrc: Oral  SpO2: 97%  Weight: 124 lb 1.6 oz (56.3 kg)  Height: 5' (1.524 m)   Body mass index is 24.24 kg/m. Physical Exam  Constitutional: She is oriented to person, place, and time. Vital signs are normal. She appears well-developed and well-nourished. She is active and cooperative. She does not appear ill. No distress.  HENT:  Head: Normocephalic and atraumatic.  Mouth/Throat: Uvula is midline, oropharynx is clear and moist and mucous membranes are normal. Mucous membranes are not pale, not dry and not cyanotic.  Eyes: Conjunctivae, EOM and lids are normal. Pupils are equal, round, and reactive to light.  Neck: Trachea normal, normal range of motion and full passive range of motion without pain. Neck supple. No  JVD present. No tracheal deviation, no edema and no erythema present. No thyromegaly present.  Cardiovascular: Normal rate, normal heart sounds, intact distal pulses and normal pulses. An irregular rhythm present. Exam reveals no gallop, no distant heart sounds and no friction rub.  No murmur heard. Pulses:      Dorsalis pedis pulses are 2+ on the right side, and 2+ on the left side.  No edema  Pulmonary/Chest: Effort normal and breath sounds normal. No accessory muscle usage. No respiratory distress. She has no decreased breath sounds. She has no wheezes. She has no rhonchi. She has no rales. She exhibits no tenderness.  Abdominal: Soft. Normal appearance and bowel sounds are normal. She exhibits no distension and no ascites. There is no tenderness.  Musculoskeletal: Normal range of motion. She exhibits no edema or tenderness.  Expected osteoarthritis, stiffness; Bilateral Calves soft, supple. Negative Homan's Sign. B- pedal pulses equal; generalized weakness; mobile on unit in wheelchair  Neurological: She is alert and oriented to person, place, and time. She has normal strength. She displays atrophy and tremor. She exhibits abnormal muscle tone. Coordination and gait abnormal.  Skin: Skin is warm, dry and intact. She is not diaphoretic. No cyanosis. No pallor. Nails show no clubbing.  Psychiatric: She has a normal mood and affect. Her speech is normal and behavior is normal. Judgment and thought content normal. Cognition and memory are normal.  Nursing note and vitals reviewed.   Labs reviewed: Recent Labs    06/20/16 0552  10/10/16 0620 12/24/16 1144 01/23/17 1300  NA 136   < > 139 137 137  K 3.5   < > 3.8 3.5 4.2  CL 102   < > 100* 98* 102  CO2 28   < > 31 29 28   GLUCOSE 125*   < > 97 112* 95  BUN 16   < > 14 13 13   CREATININE 0.45   < > 0.57 0.62 0.57  CALCIUM 7.6*   < > 8.8* 8.8* 8.9  MG 1.7  --   --   --   --    < > = values in this interval not displayed.   Recent Labs     06/18/16 1956 09/16/16 1410 12/24/16 1144  AST 18 19 17   ALT <5* 6* 5*  ALKPHOS 40 50 73  BILITOT 0.8 0.5 0.7  PROT 6.4* 7.4 6.9  ALBUMIN 3.2* 3.8 3.6   Recent Labs    06/18/16 1956  09/16/16 1410  10/03/16 0413 10/04/16 0416 12/24/16 1144  WBC 3.8   < > 9.2   < > 12.7* 11.9* 7.3  NEUTROABS 1.7  --  4.3  --   --   --  4.2  HGB 11.4*   < > 13.0   < > 12.4 12.2 13.1  HCT 34.7*   < > 38.9   < > 37.7 36.8 39.5  MCV 90.6   < > 94.6   < > 95.1 95.7 89.8  PLT 335   < > 299   < > 240 220 298   < > = values in this interval not displayed.   Lab Results  Component Value Date   TSH 2.420 09/30/2016   No results found for: HGBA1C No results found for: CHOL, HDL, LDLCALC, LDLDIRECT, TRIG, CHOLHDL  Significant Diagnostic Results in last 30 days:  No results found.  Assessment/Plan Nakai was seen today for medical management of chronic issues.  Diagnoses and all orders for this visit:  Sick sinus syndrome ([place])  PE (pulmonary thromboembolism) ([place])  Hypertensive heart disease with heart failure ([place])   above listed conditions stable  Continue current medication regimen  Continue daily weights  Monitor for edema  Elevate legs when at rest  TED hose- on in the early am, off in the pm  Family/ staff Communication:   Total Time:  Documentation:  Face to Face:  Family/Phone:   Labs/tests ordered:  Not due  Medication list reviewed and assessed for continued appropriateness. Monthly medication orders reviewed and signed.  Vikki Ports, NP-C Geriatrics [facility] [phone], [place] [address] Cell Phone (Mon-Fri 8am-5pm):  [phone] On Call:  [phone] & follow prompts after 5pm & weekends Office Phone:  [phone] Office Fax:  [phone]

## 2017-03-25 ENCOUNTER — Other Ambulatory Visit: Payer: Medicare Other

## 2017-03-25 ENCOUNTER — Non-Acute Institutional Stay (SKILLED_NURSING_FACILITY): Payer: Medicare Other | Admitting: Gerontology

## 2017-03-25 ENCOUNTER — Encounter: Payer: Self-pay | Admitting: Gerontology

## 2017-03-25 ENCOUNTER — Ambulatory Visit: Payer: Medicare Other | Admitting: Hematology and Oncology

## 2017-03-25 DIAGNOSIS — I5032 Chronic diastolic (congestive) heart failure: Secondary | ICD-10-CM | POA: Diagnosis not present

## 2017-03-25 DIAGNOSIS — H6123 Impacted cerumen, bilateral: Secondary | ICD-10-CM | POA: Diagnosis not present

## 2017-03-25 DIAGNOSIS — I824Z1 Acute embolism and thrombosis of unspecified deep veins of right distal lower extremity: Secondary | ICD-10-CM | POA: Diagnosis not present

## 2017-03-28 ENCOUNTER — Encounter
Admission: RE | Admit: 2017-03-28 | Discharge: 2017-03-28 | Disposition: A | Discharge status: Home / Self Care | Payer: Medicare Other | Source: Ambulatory Visit | Attending: Internal Medicine | Admitting: Internal Medicine

## 2017-03-31 ENCOUNTER — Inpatient Hospital Stay: Payer: Medicare Other | Attending: Hematology and Oncology

## 2017-03-31 ENCOUNTER — Inpatient Hospital Stay (HOSPITAL_BASED_OUTPATIENT_CLINIC_OR_DEPARTMENT_OTHER): Payer: Medicare Other | Admitting: Hematology and Oncology

## 2017-03-31 ENCOUNTER — Encounter: Payer: Self-pay | Admitting: Hematology and Oncology

## 2017-03-31 VITALS — BP 105/67 | HR 69 | Temp 97.2°F | Resp 20 | Wt 123.0 lb

## 2017-03-31 DIAGNOSIS — Z86718 Personal history of other venous thrombosis and embolism: Secondary | ICD-10-CM | POA: Insufficient documentation

## 2017-03-31 DIAGNOSIS — Z7901 Long term (current) use of anticoagulants: Secondary | ICD-10-CM | POA: Insufficient documentation

## 2017-03-31 DIAGNOSIS — C50412 Malignant neoplasm of upper-outer quadrant of left female breast: Secondary | ICD-10-CM | POA: Diagnosis present

## 2017-03-31 DIAGNOSIS — I824Z1 Acute embolism and thrombosis of unspecified deep veins of right distal lower extremity: Secondary | ICD-10-CM

## 2017-03-31 DIAGNOSIS — G620 Drug-induced polyneuropathy: Secondary | ICD-10-CM | POA: Diagnosis not present

## 2017-03-31 DIAGNOSIS — Z86711 Personal history of pulmonary embolism: Secondary | ICD-10-CM

## 2017-03-31 DIAGNOSIS — G20 Parkinson's disease: Secondary | ICD-10-CM | POA: Diagnosis not present

## 2017-03-31 DIAGNOSIS — M810 Age-related osteoporosis without current pathological fracture: Secondary | ICD-10-CM

## 2017-03-31 DIAGNOSIS — Z170 Estrogen receptor positive status [ER+]: Secondary | ICD-10-CM

## 2017-03-31 LAB — CBC WITH DIFFERENTIAL/PLATELET
Basophils Absolute: 0.1 10*3/uL (ref 0–0.1)
Basophils Relative: 1 %
Eosinophils Absolute: 0.2 10*3/uL (ref 0–0.7)
Eosinophils Relative: 3 %
HCT: 40.7 % (ref 35.0–47.0)
Hemoglobin: 13.6 g/dL (ref 12.0–16.0)
Lymphocytes Relative: 32 %
Lymphs Abs: 2.5 10*3/uL (ref 1.0–3.6)
MCH: 31.9 pg (ref 26.0–34.0)
MCHC: 33.4 g/dL (ref 32.0–36.0)
MCV: 95.6 fL (ref 80.0–100.0)
Monocytes Absolute: 0.7 10*3/uL (ref 0.2–0.9)
Monocytes Relative: 9 %
Neutro Abs: 4.4 10*3/uL (ref 1.4–6.5)
Neutrophils Relative %: 55 %
Platelets: 345 10*3/uL (ref 150–440)
RBC: 4.26 MIL/uL (ref 3.80–5.20)
RDW: 14.3 % (ref 11.5–14.5)
WBC: 7.9 10*3/uL (ref 3.6–11.0)

## 2017-03-31 LAB — COMPREHENSIVE METABOLIC PANEL
ALT: <5 U/L — ABNORMAL LOW (ref 14–54)
AST: 18 U/L (ref 15–41)
Albumin: 3.8 g/dL (ref 3.5–5.0)
Alkaline Phosphatase: 68 U/L (ref 38–126)
Anion gap: 9 (ref 5–15)
BUN: 14 mg/dL (ref 6–20)
CO2: 28 mmol/L (ref 22–32)
Calcium: 9.4 mg/dL (ref 8.9–10.3)
Chloride: 101 mmol/L (ref 101–111)
Creatinine, Ser: 0.75 mg/dL (ref 0.44–1.00)
GFR calc Af Amer: >60 mL/min (ref >60)
GFR calc non Af Amer: >60 mL/min (ref >60)
Glucose, Bld: 88 mg/dL (ref 65–99)
Potassium: 4 mmol/L (ref 3.5–5.1)
Sodium: 138 mmol/L (ref 135–145)
Total Bilirubin: 0.4 mg/dL (ref 0.3–1.2)
Total Protein: 7.6 g/dL (ref 6.5–8.1)

## 2017-03-31 NOTE — Progress Notes (Signed)
Patient offers no complaints today. 

## 2017-03-31 NOTE — Progress Notes (Signed)
[facility] day:  03/31/2017   Chief Complaint: Laura Cisneros is a 84 y.o. female with clinical stage I left breast cancer and osteoporosis who is seen for 3 month assessment.  HPI: The patient was last seen in the medical oncology clinic on 12/24/2016.  At that time, she felt "pretty good" and "better" overall.  Patient continued on Xarelto.  We discussed restarting calcium and vitamin D.  She received Prolia.  She declined further hormonal therapy for her breast cancer.  During the interim, patient has been doing "alright, I reckon". Patient states, "When you get this age, you are thankful for your health". Patient verbalizes no breast concerns. She has experienced no B symptoms or interval infections. Patient continues on her Xarelto as prescribed. Patient is on calcium and vitamin D supplementation.   She is using Premarin cream once a week for vaginal atrophy. She is aware of the estrogen content of this intervention.   Patient is eating well. Her weight is up 1 pound. Patient denies pain in the clinic today.   Patient performs monthly self breast examinations as recommended.    Past Medical History:  Diagnosis Date  . A-fib ([place])    unspecified  . Anemia    unspecified  . Arthritis    osteoarthritis  . Atrophic vaginitis   . Bleeding hemorrhoid   . Breast cancer ([place])    unspecified  . Cancer ([place])    Breast  . Cataract   . Diverticulosis   . DVT (deep vein thrombosis) in pregnancy ([place])   . Hiatal hernia   . Hypertension   . Incontinence   . Osteoarthritis   . Osteoporosis    a. Intolerance to Fosamax b. Bilateral foot fractures c. IV Boniva d. Low vitamin D e. Reclast  . Parkinson disease ([place])   . PE (pulmonary thromboembolism) ([place])   . Scleritis    Idiopathic a. Prednisone b. s/p methotrexate  . TIA (transient ischemic attack) 1997   Possible  . Urinary urgency   . Vitamin B12 deficiency     Past Surgical History:   Procedure Laterality Date  . ABDOMINAL HYSTERECTOMY    . ABDOMINAL HYSTERECTOMY     partial  . BLADDER TACK SURGERY     Dr. Ouida Sills  . BREAST BIOPSY Left 10/2012   benign  . BREAST BIOPSY Left 11/22/2015   invasive mammary ca   . BREAST EXCISIONAL BIOPSY Left 12/08/2015   lumpectomy  . BREAST LUMPECTOMY Left 11/2015   Invasive mammary carcinoma Grade I  . EYE SURGERY Bilateral    Catarct Extraction with IOL  . FINGER SURGERY Right   . FOOT FRACTURE SURGERY Right 06/2003   pinning, Dr. Francia Greaves, [facility]  . FOOT SURGERY Left   . INCONTINENCE SURGERY    . NEUROMA SURGERY    . PACEMAKER INSERTION Right 10/02/2016   Procedure: INSERTION PACEMAKER;  Surgeon: Isaias Cowman, MD;  Location: [facility] ORS;  Service: Cardiovascular;  Laterality: Right;  . PARTIAL MASTECTOMY WITH AXILLARY SENTINEL LYMPH NODE BIOPSY Left 12/08/2015   Procedure: PARTIAL MASTECTOMY WITH AXILLARY SENTINEL LYMPH NODE BIOPSY;  Surgeon: [name], MD;  Location: [facility] ORS;  Service: General;  Laterality: Left;    Family History  Problem Relation Age of Onset  . Esophageal cancer Sister   . Kidney disease Neg Hx   . Bladder Cancer Neg Hx   . Breast cancer Neg Hx     Social History:  reports that  has never smoked. she has never used smokeless tobacco. She reports that she does not drink alcohol or use drugs.  She denies any exposure to radiation or toxins.  She previously worked in a [employer].  She lives at the [place] at [place].  Her son's phone number is [phone].  Her grand daughter is [name].  The patient is accompanied by her son, Blue.  Allergies: No Known Allergies  Current Medications: Current Outpatient Medications  Medication Sig Dispense Refill  . acetaminophen (TYLENOL) 500 MG tablet Take 500 mg by mouth every 6 (six) hours as needed (pain).    Marland Kitchen alum & mag hydroxide-simeth (MAALOX PLUS) 400-400-40 MG/5ML suspension Take 30 mLs every 4 (four) hours as needed by mouth for indigestion.     . calcium carbonate (OSCAL) 1500 (600 Ca) MG TABS tablet Take 2 tablets by mouth daily.    . carbidopa-levodopa (SINEMET CR) 50-200 MG tablet Take 3 tablets by mouth 3 (three) times daily.    . cholecalciferol (VITAMIN D) 400 units TABS tablet Take 800 Units by mouth daily. Per cancer center office visit note on 11/27    . conjugated estrogens (PREMARIN) vaginal cream Apply 0.5 mg ( pea-sized amount) just inside the vaginal introitus with finger-tip weekly on Monday nights    . cyanocobalamin (,VITAMIN B-12,) 1000 MCG/ML injection INJECT 1ML INTO MUSCLE ONCE A MONTH AS DIRECTED    . diltiazem (CARDIZEM) 120 MG tablet Take 120 mg by mouth daily.    . furosemide (LASIX) 40 MG tablet Take 40 mg by mouth daily.     . Hyprom-Naphaz-Polysorb-Zn Sulf (CLEAR EYES COMPLETE) SOLN Place 2 drops every 2 (two) hours as needed into both eyes. For dry, irritated eyes. Ok to leave at bedside.    . [place] (DERMACLOUD) CREA Apply liberal amount to area of skin irritation topically as needed. Ok to leave at bedside.    . latanoprost (XALATAN) 0.005 % ophthalmic solution Place 1 drop into both eyes at bedtime. Per Dr. Wallace Going    . Melatonin 3 MG TABS Take 3 mg by mouth at bedtime.    . mirabegron ER (MYRBETRIQ) 25 MG TB24 tablet Take 25 mg by mouth daily.    . potassium chloride SA (K-DUR,KLOR-CON) 20 MEQ tablet Take 20 mEq by mouth daily.    Marland Kitchen rOPINIRole (REQUIP) 0.5 MG tablet Take 1 tablet (0.5 mg total) by mouth at bedtime.    Alveda Reasons 20 MG TABS tablet Take 20 mg by mouth daily.   0   No current facility-administered medications for this visit.     Review of Systems:  GENERAL: Feels "alright, I reckon".  No fevers or sweats.  Weight up 1 pound since last clinic visit. PERFORMANCE STATUS (ECOG):  2 HEENT:  No visual changes, runny nose, sore throat, mouth sores or tenderness. Lungs: No shortness of breath or cough.  No hemoptysis.  Interval pulmonary embolism. Cardiac:  No chest pain,  palpitations, orthopnea, or PND. GI:  No nausea, vomiting, diarrhea, constipation, melena or hematochezia. GU:  No urgency, frequency, dysuria, or hematuria. h/o recurrent UTIs and vaginal atrophy on estrogen cream sparingly. Musculoskeletal:  No back pain.  Arthritis pain.  No muscle tenderness. Took Zometa x 5 years. Extremities:  No pain or swelling. Skin:  No rashes or skin changes. Neuro:  Parkinson's disease.  No headache, numbness or weakness,or coordination issues.  Gait is unstable. Endocrine:  No diabetes, thyroid issues, hot flashes or night sweats. Psych:  No mood changes, depression or  anxiety. Pain:  No focal pain. Review of systems:  All other systems reviewed and found to be negative.  Physical Exam: Blood pressure 105/67, pulse 69, temperature (!) 97.2 F (36.2 C), temperature source Tympanic, resp. rate 20, weight 123 lb (55.8 kg). GENERAL:  Thin elderly woman sitting comfortably in the exam room in no acute distress. HEAD:  Curly gray hair.  Normocephalic, atraumatic, face symmetric, no Cushingoid features. EYES:Blueeyes. Pupils equal round and reactive to light and accomodation. No conjunctivitis or scleral icterus. ENT:  Oropharynx clear without lesion.  Dentures.  Tongue normal. Mucous membranes moist. RESPIRATORY:Clear to auscultationwithout rales, wheezes or rhonchi. CARDIOVASCULAR:Regular rate andrhythmwithout murmur, rub or gallop. BREAST:  Right breast with fibrocystic changes.  No discrete masses, skin changes or nipple discharge.  Left breast with well healed lateral incision.  Fibrocystic changes laterally.  No skin changes or nipple discharge.  ABDOMEN:Soft, non-tender, with active bowel sounds, and no hepatosplenomegaly. No masses. SKIN: No rashes, ulcers or lesions. EXTREMITIES: Ankle edema.  No skin discoloration or tenderness. No palpable cords. LYMPHNODES: No palpable cervical, supraclavicular, axillary or inguinal adenopathy   NEUROLOGICAL: Tremor. PSYCH: Appropriate.   Appointment on 03/31/2017  Component Date Value Ref Range Status  . WBC 03/31/2017 7.9  3.6 - 11.0 K/uL Final  . RBC 03/31/2017 4.26  3.80 - 5.20 MIL/uL Final  . Hemoglobin 03/31/2017 13.6  12.0 - 16.0 g/dL Final  . HCT 03/31/2017 40.7  35.0 - 47.0 % Final  . MCV 03/31/2017 95.6  80.0 - 100.0 fL Final  . MCH 03/31/2017 31.9  26.0 - 34.0 pg Final  . MCHC 03/31/2017 33.4  32.0 - 36.0 g/dL Final  . RDW 03/31/2017 14.3  11.5 - 14.5 % Final  . Platelets 03/31/2017 345  150 - 440 K/uL Final  . Neutrophils Relative % 03/31/2017 55  % Final  . Neutro Abs 03/31/2017 4.4  1.4 - 6.5 K/uL Final  . Lymphocytes Relative 03/31/2017 32  % Final  . Lymphs Abs 03/31/2017 2.5  1.0 - 3.6 K/uL Final  . Monocytes Relative 03/31/2017 9  % Final  . Monocytes Absolute 03/31/2017 0.7  0.2 - 0.9 K/uL Final  . Eosinophils Relative 03/31/2017 3  % Final  . Eosinophils Absolute 03/31/2017 0.2  0 - 0.7 K/uL Final  . Basophils Relative 03/31/2017 1  % Final  . Basophils Absolute 03/31/2017 0.1  0 - 0.1 K/uL Final   Performed at [facility], [address] [place], [place] [address]  . Sodium 03/31/2017 138  135 - 145 mmol/L Final  . Potassium 03/31/2017 4.0  3.5 - 5.1 mmol/L Final  . Chloride 03/31/2017 101  101 - 111 mmol/L Final  . CO2 03/31/2017 28  22 - 32 mmol/L Final  . Glucose, Bld 03/31/2017 88  65 - 99 mg/dL Final  . BUN 03/31/2017 14  6 - 20 mg/dL Final  . Creatinine, Ser 03/31/2017 0.75  0.44 - 1.00 mg/dL Final  . Calcium 03/31/2017 9.4  8.9 - 10.3 mg/dL Final  . Total Protein 03/31/2017 7.6  6.5 - 8.1 g/dL Final  . Albumin 03/31/2017 3.8  3.5 - 5.0 g/dL Final  . AST 03/31/2017 18  15 - 41 U/L Final  . ALT 03/31/2017 <5* 14 - 54 U/L Final  . Alkaline Phosphatase 03/31/2017 68  38 - 126 U/L Final  . Total Bilirubin 03/31/2017 0.4  0.3 - 1.2 mg/dL Final  . GFR calc non Af Amer 03/31/2017 >60  >60 mL/min Final  . GFR  calc Af Amer 03/31/2017 >60   >60 mL/min Final   Comment: (NOTE) The eGFR has been calculated using the CKD EPI equation. This calculation has not been validated in all clinical situations. eGFR's persistently <60 mL/min signify possible Chronic Kidney Disease.   Georgiann Hahn gap 03/31/2017 9  5 - 15 Final   Performed at [facility], [address] [place], [place] [address]    Assessment:  TIANNI ESCAMILLA is a 84 y.o. female with stage I left breast cancer s/p partial mastectomy and sentinel lymph node biopsy on 12/08/2015.  Pathology revealed a 0.7 cm grade I  invasive mammary carcinoma of no special type.  There was no lymphvascular invasion.  Margins were clear.  One sentinel lymph node was negative.  Tumor was ER positive (> 90%), PR negative, and HER-2/neu 2+ (negative by FISH).  Pathologic stage was T1bN0.  CA 27.29 was 27.6 on 12/08/2015.  Bilateral diagnostic mammogram on 11/06/2015 revealed a suspicious 5 mm mass in the upper outer left breast.  Bilateral mammogram on 11/08/2016 revealed no evidence of malignancy in either breast.  Radiation therapy was deferred secondary to her age, Parkinson's disease, and small well differentiated ER/PR+ tumor.  She began tamoxifen on 01/12/2016.  Tamoxifen was discontinued on 06/18/2016 after a DVT and pulmonary embolism.  She declined further endocrine therapy.  CA27.29 has been followed: 27.6 on 12/04/2015, 24.4 on 05/17/2016, 32.3 on 09/16/2016, 27.9 on 12/24/2016, and 28 on 03/31/2017.  She has osteoporosis and was previously on Zometa for 5 years.  Bone density study on 12/05/2015 revealed osteoporosis with a T-score of -2.9 in AP spine L1-L4 and -2.5 in the right femoral neck.  She began Prolia on 12/19/2015 (last injection on 12/24/2016).  She has Parkinson's disease.  Gait is unstable.  She has a history of recurrent UTIs, vaginal atrophy, and incontinence.  She is using estrogen cream sparingly (once a week).  She has a history of iron deficiency.  She notes 2  colonoscopies in the past. She has been on B12 shots for years.   She was diagnosed with a DVT and pulmonary embolism on 06/05/2016.  Right lower extremity duplex revealed a DVT in the right lower calf veins.  CT angiogram revealed an acute embolism of the right lower lobar artery extending into the anterior, lateral and posterior basal segmental arteries within the right upper lobe anterior segmental artery.  There was no CT evidence of acute right heart strain.  She is on Xarelto.  Symptomatically, she feels "alright".  Patient has no acute complaints today. Patient denies breast concerns. Exam reveals fibrocystic changes in the LEFT breast. Labs are unremarkable.    Plan: 1.  Labs today:  CBC with diff, CMP, CA27.29. 2.  Discuss  calcium and Vitamin D supplementation given the patients history of osteoporosis. Patient on calcium 1200 mg daily and Vitamin D 800 units daily.  3.  Discuss use of Premarin cream sparingly. 4.  Continue anticoagulation (Xarelto) as prescribed. Dr. Ubaldo Glassing managing this medication.  5.  RTC on 06/23/2017 for MD assessment, labs (CBC with diff, CMP) and Prolia.  Anticipate spacing out appointments to every 6 months after next visit.    Honor Loh, NP  03/31/2017, 3:51 PM   I saw and evaluated the patient, participating in the key portions of the service and reviewing pertinent diagnostic studies and records.  I reviewed the nurse practitioner's note and agree with the findings and the plan.  The assessment and plan were discussed with  the patient.  A few questions were asked by the patient and answered.   Nolon Stalls, MD 03/31/2017,3:51 PM

## 2017-04-01 LAB — CA 27.29 (SERIAL MONITOR): CA 27.29: 28 U/mL (ref 0.0–38.6)

## 2017-04-21 ENCOUNTER — Other Ambulatory Visit
Admission: RE | Admit: 2017-04-21 | Discharge: 2017-04-21 | Disposition: A | Discharge status: Home / Self Care | Payer: Medicare Other | Source: Ambulatory Visit | Attending: Gerontology | Admitting: Gerontology

## 2017-04-21 DIAGNOSIS — R350 Frequency of micturition: Secondary | ICD-10-CM | POA: Insufficient documentation

## 2017-04-21 DIAGNOSIS — R3915 Urgency of urination: Secondary | ICD-10-CM | POA: Diagnosis present

## 2017-04-21 DIAGNOSIS — R32 Unspecified urinary incontinence: Secondary | ICD-10-CM | POA: Diagnosis present

## 2017-04-21 LAB — URINALYSIS, COMPLETE (UACMP) WITH MICROSCOPIC
Bilirubin Urine: NEGATIVE
Glucose, UA: NEGATIVE mg/dL
Hgb urine dipstick: NEGATIVE
Ketones, ur: NEGATIVE mg/dL
LEUKOCYTES UA: NEGATIVE
Nitrite: NEGATIVE
PROTEIN: NEGATIVE mg/dL
SPECIFIC GRAVITY, URINE: 1.009 (ref 1.005–1.030)
pH: 7 (ref 5.0–8.0)

## 2017-04-23 LAB — URINE CULTURE

## 2017-04-27 ENCOUNTER — Other Ambulatory Visit
Admission: RE | Admit: 2017-04-27 | Discharge: 2017-04-27 | Disposition: A | Discharge status: Home / Self Care | Payer: Medicare Other | Source: Skilled Nursing Facility | Attending: Internal Medicine | Admitting: Internal Medicine

## 2017-04-27 DIAGNOSIS — R350 Frequency of micturition: Secondary | ICD-10-CM | POA: Diagnosis present

## 2017-04-27 DIAGNOSIS — R300 Dysuria: Secondary | ICD-10-CM | POA: Insufficient documentation

## 2017-04-27 LAB — URINALYSIS, COMPLETE (UACMP) WITH MICROSCOPIC
Bilirubin Urine: NEGATIVE
GLUCOSE, UA: NEGATIVE mg/dL
HGB URINE DIPSTICK: NEGATIVE
Ketones, ur: 5 mg/dL — AB
NITRITE: NEGATIVE
Protein, ur: NEGATIVE mg/dL
SPECIFIC GRAVITY, URINE: 1.014 (ref 1.005–1.030)
pH: 5 (ref 5.0–8.0)

## 2017-04-28 ENCOUNTER — Encounter
Admission: RE | Admit: 2017-04-28 | Discharge: 2017-04-28 | Disposition: A | Discharge status: Home / Self Care | Payer: Medicare Other | Source: Ambulatory Visit | Attending: Internal Medicine | Admitting: Internal Medicine

## 2017-04-29 LAB — URINE CULTURE

## 2017-04-30 ENCOUNTER — Non-Acute Institutional Stay (SKILLED_NURSING_FACILITY): Payer: Medicare Other | Admitting: Gerontology

## 2017-04-30 ENCOUNTER — Encounter: Payer: Self-pay | Admitting: Gerontology

## 2017-04-30 DIAGNOSIS — R1312 Dysphagia, oropharyngeal phase: Secondary | ICD-10-CM

## 2017-04-30 DIAGNOSIS — M159 Polyosteoarthritis, unspecified: Secondary | ICD-10-CM

## 2017-04-30 DIAGNOSIS — M1990 Unspecified osteoarthritis, unspecified site: Secondary | ICD-10-CM | POA: Insufficient documentation

## 2017-04-30 DIAGNOSIS — M150 Primary generalized (osteo)arthritis: Secondary | ICD-10-CM | POA: Diagnosis not present

## 2017-04-30 DIAGNOSIS — I480 Paroxysmal atrial fibrillation: Secondary | ICD-10-CM

## 2017-04-30 DIAGNOSIS — H6123 Impacted cerumen, bilateral: Secondary | ICD-10-CM | POA: Insufficient documentation

## 2017-04-30 NOTE — Assessment & Plan Note (Signed)
Fairly stable Pt reports she has almost constant OA pain somewhere. She reports nursing gives her Tylenol for the pain. This is effective. Will schedule some daily doses for better pain control.

## 2017-04-30 NOTE — Progress Notes (Signed)
Location:    Nursing Home Room Number: 009F Place of Service:  SNF (31) Provider:  Toni Arthurs, NP-C  Leonel Ramsay, MD  Patient Care Team: Leonel Ramsay, MD as PCP - General (Infectious Diseases)  Extended Emergency Contact Information Primary Emergency Contact: Galea,Timothy L Address: [address]          [place], [place] [address] [name] of [place] Phone: [phone] Relation: Son Secondary Emergency Contact: Dirk,James Address: [address]          [place], [place] [address] [name] of [place] Phone: [phone] Work Phone: [phone] Mobile Phone: [phone] Relation: Son  Code Status:  FULL Goals of care: Advanced Directive information Advanced Directives 04/30/2017  Does Patient Have a Medical Advance Directive? No  Type of Advance Directive -  Does patient want to make changes to medical advance directive? -  Copy of [place] in Chart? -  Would patient like information on creating a medical advance directive? No - Patient declined     Chief Complaint  Patient presents with  . Medical Management of Chronic Issues    Routine Visit    HPI:  Pt is a 84 y.o. female seen today for medical management of chronic diseases.    Cerumen debris on tympanic membrane of both ears Cerumen observed in ears. Pt c/o ears popping. Reports she feels this sx when she has a build up of cerumen. Will order Debrox and refer to ENT per pt choice.   Deep vein thrombosis (DVT) of distal vein of right lower extremity (HCC) Stable. No evidence of ongoing symptoms. Pt is on Xarelto 20 mg po Q Day for DVT treatment. No s/s of bleeding.   Chronic diastolic (congestive) heart failure (HCC) Stable. No recent exacerbation of CHF symptoms. Pt is on Lasix 40 mg po Q Day with Potassium chloride 20 mEq PO Q Day. No edema noted.     Past Medical History:  Diagnosis Date  . A-fib ([place])    unspecified  . Anemia    unspecified    . Arthritis    osteoarthritis  . Atrophic vaginitis   . Bleeding hemorrhoid   . Breast cancer ([place])    unspecified  . Cancer ([place])    Breast  . Cataract   . Diverticulosis   . DVT (deep vein thrombosis) in pregnancy ([place])   . Hiatal hernia   . Hypertension   . Incontinence   . Osteoarthritis   . Osteoporosis    a. Intolerance to Fosamax b. Bilateral foot fractures c. IV Boniva d. Low vitamin D e. Reclast  . Parkinson disease ([place])   . PE (pulmonary thromboembolism) ([place])   . Scleritis    Idiopathic a. Prednisone b. s/p methotrexate  . TIA (transient ischemic attack) 1997   Possible  . Urinary urgency   . Vitamin B12 deficiency    Past Surgical History:  Procedure Laterality Date  . ABDOMINAL HYSTERECTOMY    . ABDOMINAL HYSTERECTOMY     partial  . BLADDER TACK SURGERY     Dr. Ouida Sills  . BREAST BIOPSY Left 10/2012   benign  . BREAST BIOPSY Left 11/22/2015   invasive mammary ca   . BREAST EXCISIONAL BIOPSY Left 12/08/2015   lumpectomy  . BREAST LUMPECTOMY Left 11/2015   Invasive mammary carcinoma Grade I  . EYE SURGERY Bilateral    Catarct Extraction with IOL  . FINGER SURGERY Right   . FOOT FRACTURE SURGERY Right 06/2003  pinning, Dr[facility] Franc[facility]  . FOOT SURGERY Left   . INCONTINENCE SURGERY    . NEUROMA SURGERY    . PACEMAKER INSERTION Right 10/02/2016   Procedure: INSERTION PACEMAKER;  Surgeon: Isaias Cowman, MD;  Location: [facility] ORS;  Service: Cardiovascular;  Laterality: Right;  . PARTIAL MASTECTOMY WITH AXILLARY SENTINEL LYMPH NODE BIOPSY Left 12/08/2015   Procedure: PARTIAL MASTECTOMY WITH AXILLARY SENTINEL LYMPH NODE BIOPSY;  Surgeon: [name], MD;  Location: [facility] ORS;  Service: General;  Laterality: Left;    No Known Allergies  Allergies as of 03/25/2017   No Known Allergies     Medication List        Accurate as of 03/25/17 11:59 PM. Always use your most recent med list.          acetaminophen 500 MG tablet Commonly  known as:  TYLENOL Take 500 mg by mouth every 6 (six) hours as needed (pain).   alum & mag hydroxide-simeth 662-947-65 MG/5ML suspension Commonly known as:  MAALOX PLUS Take 30 mLs every 4 (four) hours as needed by mouth for indigestion.   calcium carbonate 1500 (600 Ca) MG Tabs tablet Commonly known as:  OSCAL Take 2 tablets by mouth daily.   carbidopa-levodopa 50-200 MG tablet Commonly known as:  SINEMET CR Take 3 tablets by mouth 3 (three) times daily.   cholecalciferol 400 units Tabs tablet Commonly known as:  VITAMIN D Take 800 Units by mouth daily. Per cancer center office visit note on 11/27   CLEAR EYES COMPLETE Soln Place 2 drops every 2 (two) hours as needed into both eyes. For dry, irritated eyes. Ok to leave at bedside.   cyanocobalamin 1000 MCG/ML injection Commonly known as:  (VITAMIN B-12) INJECT 1ML INTO MUSCLE ONCE A MONTH AS DIRECTED   DERMACLOUD Crea Apply liberal amount to area of skin irritation topically as needed. Ok to leave at bedside.   diltiazem 120 MG tablet Commonly known as:  CARDIZEM Take 120 mg by mouth daily.   furosemide 40 MG tablet Commonly known as:  LASIX Take 40 mg by mouth daily.   latanoprost 0.005 % ophthalmic solution Commonly known as:  XALATAN Place 1 drop into both eyes at bedtime. Per Dr. Wallace Going   Melatonin 3 MG Tabs Take 3 mg by mouth at bedtime.   mirabegron ER 25 MG Tb24 tablet Commonly known as:  MYRBETRIQ Take 25 mg by mouth daily.   potassium chloride SA 20 MEQ tablet Commonly known as:  K-DUR,KLOR-CON Take 20 mEq by mouth daily.   PREMARIN vaginal cream Generic drug:  conjugated estrogens Apply 0.5 mg ( pea-sized amount) just inside the vaginal introitus with finger-tip weekly on Monday nights   rOPINIRole 0.5 MG tablet Commonly known as:  REQUIP Take 1 tablet (0.5 mg total) by mouth at bedtime.   XARELTO 20 MG Tabs tablet Generic drug:  rivaroxaban Take 20 mg by mouth daily.       Review of  Systems  Constitutional: Negative for activity change, appetite change, chills, diaphoresis and fever.  HENT: Positive for ear discharge and hearing loss (ears "popping"). Negative for congestion, mouth sores, nosebleeds, postnasal drip, sneezing, sore throat, trouble swallowing and voice change.   Respiratory: Negative for apnea, cough, choking, chest tightness, shortness of breath and wheezing.   Cardiovascular: Negative for chest pain, palpitations and leg swelling.  Gastrointestinal: Negative for abdominal distention, abdominal pain, constipation, diarrhea and nausea.  Genitourinary: Negative for difficulty urinating, dysuria, frequency and urgency.  Musculoskeletal: Positive for arthralgias (  typical arthritis). Negative for back pain, gait problem and myalgias.  Skin: Negative for color change, pallor, rash and wound.  Neurological: Positive for weakness. Negative for dizziness, tremors, syncope, speech difficulty, numbness and headaches.  Psychiatric/Behavioral: Negative for agitation and behavioral problems.  All other systems reviewed and are negative.   Immunization History  Administered Date(s) Administered  . Influenza-Unspecified 10/22/2016  . Pneumococcal Conjugate-13 11/18/2013   Pertinent  Health Maintenance Due  Topic Date Due  . PNA vac Low Risk Adult (2 of 2 - PPSV23) 11/19/2014  . INFLUENZA VACCINE  08/28/2017  . DEXA SCAN  Completed   No flowsheet data found. Functional Status Survey:    Vitals:   03/25/17 1502  BP: 110/72  Pulse: 97  Resp: 20  Temp: 98.1 F (36.7 C)  TempSrc: Oral  SpO2: 96%  Weight: 122 lb 11.2 oz (55.7 kg)  Height: 5' (1.524 m)   Body mass index is 23.96 kg/m. Physical Exam  Constitutional: She is oriented to person, place, and time. Vital signs are normal. She appears well-developed and well-nourished. She is active and cooperative. She does not appear ill. No distress.  HENT:  Head: Normocephalic and atraumatic.  Mouth/Throat:  Uvula is midline, oropharynx is clear and moist and mucous membranes are normal. Mucous membranes are not pale, not dry and not cyanotic.  Eyes: Pupils are equal, round, and reactive to light. Conjunctivae, EOM and lids are normal.  Neck: Trachea normal, normal range of motion and full passive range of motion without pain. Neck supple. No JVD present. No tracheal deviation, no edema and no erythema present. No thyromegaly present.  Cardiovascular: Normal rate, intact distal pulses and normal pulses. An irregular rhythm present. Exam reveals no gallop, no distant heart sounds and no friction rub.  Murmur heard. Pulses:      Dorsalis pedis pulses are 2+ on the right side, and 2+ on the left side.  No edema, Pacemaker  Pulmonary/Chest: Effort normal and breath sounds normal. No accessory muscle usage. No respiratory distress. She has no decreased breath sounds. She has no wheezes. She has no rhonchi. She has no rales. She exhibits no tenderness.  Abdominal: Soft. Normal appearance and bowel sounds are normal. She exhibits no distension and no ascites. There is no tenderness.  Musculoskeletal: Normal range of motion. She exhibits no edema or tenderness.  Expected osteoarthritis, stiffness; Bilateral Calves soft, supple. Negative Homan's Sign. B- pedal pulses equal; ambulatory with walker  Neurological: She is alert and oriented to person, place, and time. She has normal strength. Gait abnormal.  Skin: Skin is warm, dry and intact. She is not diaphoretic. No cyanosis. No pallor. Nails show no clubbing.  Psychiatric: She has a normal mood and affect. Her speech is normal and behavior is normal. Judgment and thought content normal. Cognition and memory are normal.  Nursing note and vitals reviewed.   Labs reviewed: Recent Labs    06/20/16 0552  12/24/16 1144 01/23/17 1300 03/31/17 1418  NA 136   < > 137 137 138  K 3.5   < > 3.5 4.2 4.0  CL 102   < > 98* 102 101  CO2 28   < > 29 28 28   GLUCOSE  125*   < > 112* 95 88  BUN 16   < > 13 13 14   CREATININE 0.45   < > 0.62 0.57 0.75  CALCIUM 7.6*   < > 8.8* 8.9 9.4  MG 1.7  --   --   --   --    < > =  values in this interval not displayed.   Recent Labs    09/16/16 1410 12/24/16 1144 03/31/17 1418  AST 19 17 18   ALT 6* 5* <5*  ALKPHOS 50 73 68  BILITOT 0.5 0.7 0.4  PROT 7.4 6.9 7.6  ALBUMIN 3.8 3.6 3.8   Recent Labs    09/16/16 1410  10/04/16 0416 12/24/16 1144 03/31/17 1418  WBC 9.2   < > 11.9* 7.3 7.9  NEUTROABS 4.3  --   --  4.2 4.4  HGB 13.0   < > 12.2 13.1 13.6  HCT 38.9   < > 36.8 39.5 40.7  MCV 94.6   < > 95.7 89.8 95.6  PLT 299   < > 220 298 345   < > = values in this interval not displayed.   Lab Results  Component Value Date   TSH 2.420 09/30/2016   No results found for: HGBA1C No results found for: CHOL, HDL, LDLCALC, LDLDIRECT, TRIG, CHOLHDL  Significant Diagnostic Results in last 30 days:  No results found.  Assessment/Plan Kursten was seen today for medical management of chronic issues.  Diagnoses and all orders for this visit:  Cerumen debris on tympanic membrane of both ears  Deep vein thrombosis (DVT) of distal vein of right lower extremity, unspecified chronicity (HCC)  Chronic diastolic (congestive) heart failure (HCC)   Above conditions stable, except  Cerumen debris  Continue current medication regimen, except  Add Debrox drops- fill B- ear canals with drops, place cottonball in ear after drops. Q 12 hours x 4 days. Flush ears with warm, soapy water at completion of treatment  Refer to ENT of choice per pt request for ears "popping"  Monitor for s/s of bleeding  Monitor for subsequent DVTs  Monitor for s/s of PE  Family/ staff Communication:   Total Time:  Documentation:  Face to Face:  Family/Phone:   Labs/tests ordered:  Not due  Medication list reviewed and assessed for continued appropriateness. Monthly medication orders reviewed and signed.  Vikki Ports, NP-C Geriatrics [facility] [phone], [place] [address] Cell Phone (Mon-Fri 8am-5pm):  [phone] On Call:  [phone] & follow prompts after 5pm & weekends Office Phone:  [phone] Office Fax:  [phone]

## 2017-04-30 NOTE — Assessment & Plan Note (Signed)
Stable. No evidence of ongoing symptoms. Pt is on Xarelto 20 mg po Q Day for DVT treatment. No s/s of bleeding.

## 2017-04-30 NOTE — Assessment & Plan Note (Signed)
Cerumen observed in ears. Pt c/o ears popping. Reports she feels this sx when she has a build up of cerumen. Will order Debrox and refer to ENT per pt choice.

## 2017-04-30 NOTE — Assessment & Plan Note (Signed)
Stable. No recent episodes of aspiration or choking. On a regular diet with chopped meats.

## 2017-04-30 NOTE — Assessment & Plan Note (Addendum)
Stable. Heart rate consistently below 100. On Cardizem 120 mg po Q Day. Denies chest pain or shortness of breath. On Xarelto 20 mg po Q Day

## 2017-04-30 NOTE — Progress Notes (Signed)
Location:    Nursing Home Room Number: 539J Place of Service:  SNF (31) Provider:  Toni Arthurs, NP-C  Leonel Ramsay, MD  Patient Care Team: Leonel Ramsay, MD as PCP - General (Infectious Diseases)  Extended Emergency Contact Information Primary Emergency Contact: Cisneros,Laura L Address: [address]          [place], [place] [address] [name] of [place] Phone: [phone] Relation: Son Secondary Emergency Contact: Cisneros,Laura Address: [address]          [place], [place] [address] [name] of [place] Phone: [phone] Work Phone: [phone] Mobile Phone: [phone] Relation: Son  Code Status:  FULL Goals of care: Advanced Directive information Advanced Directives 04/30/2017  Does Patient Have a Medical Advance Directive? No  Type of Advance Directive -  Does patient want to make changes to medical advance directive? -  Copy of [place] in Chart? -  Would patient like information on creating a medical advance directive? No - Patient declined     Chief Complaint  Patient presents with  . Medical Management of Chronic Issues    Routine Visit    HPI:  Pt is a 84 y.o. female seen today for medical management of chronic diseases.    AF (paroxysmal atrial fibrillation) (HCC) Stable. Heart rate consistently below 100. On Cardizem 120 mg po Q Day. Denies chest pain or shortness of breath. On Xarelto 20 mg po Q Day  Dysphagia, oropharyngeal phase Stable. No recent episodes of aspiration or choking. On a regular diet with chopped meats.   Osteoarthritis Fairly stable Pt reports she has almost constant OA pain somewhere. She reports nursing gives her Tylenol for the pain. This is effective. Will schedule some daily doses for better pain control.     Past Medical History:  Diagnosis Date  . A-fib ([place])    unspecified  . Anemia    unspecified  . Arthritis    osteoarthritis  . Atrophic vaginitis   .  Bleeding hemorrhoid   . Breast cancer ([place])    unspecified  . Cancer ([place])    Breast  . Cataract   . Diverticulosis   . DVT (deep vein thrombosis) in pregnancy ([place])   . Hiatal hernia   . Hypertension   . Incontinence   . Osteoarthritis   . Osteoporosis    a. Intolerance to Fosamax b. Bilateral foot fractures c. IV Boniva d. Low vitamin D e. Reclast  . Parkinson disease ([place])   . PE (pulmonary thromboembolism) ([place])   . Scleritis    Idiopathic a. Prednisone b. s/p methotrexate  . TIA (transient ischemic attack) 1997   Possible  . Urinary urgency   . Vitamin B12 deficiency    Past Surgical History:  Procedure Laterality Date  . ABDOMINAL HYSTERECTOMY    . ABDOMINAL HYSTERECTOMY     partial  . BLADDER TACK SURGERY     Dr. Ouida Sills  . BREAST BIOPSY Left 10/2012   benign  . BREAST BIOPSY Left 11/22/2015   invasive mammary ca   . BREAST EXCISIONAL BIOPSY Left 12/08/2015   lumpectomy  . BREAST LUMPECTOMY Left 11/2015   Invasive mammary carcinoma Grade I  . EYE SURGERY Bilateral    Catarct Extraction with IOL  . FINGER SURGERY Right   . FOOT FRACTURE SURGERY Right 06/2003   pinning, Dr. Francia Greaves, [facility]  . FOOT SURGERY Left   . INCONTINENCE SURGERY    . NEUROMA SURGERY    . PACEMAKER  INSERTION Right 10/02/2016   Procedure: INSERTION PACEMAKER;  Surgeon: Isaias Cowman, MD;  Location: [facility] ORS;  Service: Cardiovascular;  Laterality: Right;  . PARTIAL MASTECTOMY WITH AXILLARY SENTINEL LYMPH NODE BIOPSY Left 12/08/2015   Procedure: PARTIAL MASTECTOMY WITH AXILLARY SENTINEL LYMPH NODE BIOPSY;  Surgeon: [name], MD;  Location: [facility] ORS;  Service: General;  Laterality: Left;    No Known Allergies  Allergies as of 04/30/2017   No Known Allergies     Medication List        Accurate as of 04/30/17 11:44 PM. Always use your most recent med list.          acetaminophen 500 MG tablet Commonly known as:  TYLENOL Take 500 mg by mouth every 6 (six) hours as  needed (pain).   alum & mag hydroxide-simeth 678-938-10 MG/5ML suspension Commonly known as:  MAALOX PLUS Take 30 mLs every 4 (four) hours as needed by mouth for indigestion.   calcium carbonate 1500 (600 Ca) MG Tabs tablet Commonly known as:  OSCAL Take 2 tablets by mouth daily.   carbidopa-levodopa 50-200 MG tablet Commonly known as:  SINEMET CR Take 3 tablets by mouth 3 (three) times daily.   cholecalciferol 400 units Tabs tablet Commonly known as:  VITAMIN D Take 800 Units by mouth daily. Per cancer center office visit note on 11/27   CLEAR EYES COMPLETE Soln Place 2 drops every 2 (two) hours as needed into both eyes. For dry, irritated eyes. Ok to leave at bedside.   cyanocobalamin 1000 MCG/ML injection Commonly known as:  (VITAMIN B-12) Inject 1,000 mcg into the skin every 30 (thirty) days. On 1st of the month   DERMACLOUD Crea Apply liberal amount to area of skin irritation topically as needed. Ok to leave at bedside.   diltiazem 120 MG tablet Commonly known as:  CARDIZEM Take 120 mg by mouth daily.   furosemide 40 MG tablet Commonly known as:  LASIX Take 40 mg by mouth daily.   latanoprost 0.005 % ophthalmic solution Commonly known as:  XALATAN Place 1 drop into both eyes at bedtime. Per Dr. Wallace Going   Melatonin 3 MG Tabs Take 3 mg by mouth at bedtime.   mirabegron ER 25 MG Tb24 tablet Commonly known as:  MYRBETRIQ Take 25 mg by mouth daily.   potassium chloride SA 20 MEQ tablet Commonly known as:  K-DUR,KLOR-CON Take 20 mEq by mouth daily.   PREMARIN vaginal cream Generic drug:  conjugated estrogens Apply 0.5 mg ( pea-sized amount) just inside the vaginal introitus with finger-tip weekly on Monday nights   rOPINIRole 0.5 MG tablet Commonly known as:  REQUIP Take 1 tablet (0.5 mg total) by mouth at bedtime.   XARELTO 20 MG Tabs tablet Generic drug:  rivaroxaban Take 20 mg by mouth daily.       Review of Systems  Constitutional: Negative for  activity change, appetite change, chills, diaphoresis and fever.  HENT: Negative for congestion, mouth sores, nosebleeds, postnasal drip, sneezing, sore throat, trouble swallowing and voice change.   Respiratory: Negative for apnea, cough, choking, chest tightness, shortness of breath and wheezing.   Cardiovascular: Negative for chest pain, palpitations and leg swelling.  Gastrointestinal: Negative for abdominal distention, abdominal pain, constipation, diarrhea and nausea.  Genitourinary: Negative for difficulty urinating, dysuria, frequency and urgency.  Musculoskeletal: Positive for arthralgias and joint swelling. Negative for back pain, gait problem and myalgias.  Skin: Negative for color change, pallor, rash and wound.  Neurological: Positive for weakness. Negative for  dizziness, tremors, syncope, speech difficulty, numbness and headaches.  Psychiatric/Behavioral: Negative for agitation and behavioral problems.  All other systems reviewed and are negative.   Immunization History  Administered Date(s) Administered  . Influenza-Unspecified 10/22/2016  . Pneumococcal Conjugate-13 11/18/2013   Pertinent  Health Maintenance Due  Topic Date Due  . PNA vac Low Risk Adult (2 of 2 - PPSV23) 11/19/2014  . INFLUENZA VACCINE  08/28/2017  . DEXA SCAN  Completed   No flowsheet data found. Functional Status Survey:    Vitals:   04/30/17 1057  BP: (!) 108/57  Pulse: 86  Resp: 20  Temp: 98.2 F (36.8 C)  TempSrc: Oral  SpO2: 96%  Weight: 122 lb 14.4 oz (55.7 kg)  Height: 5' (1.524 m)   Body mass index is 24 kg/m. Physical Exam  Constitutional: She is oriented to person, place, and time. Vital signs are normal. She appears well-developed and well-nourished. She is active and cooperative. She does not appear ill. No distress.  HENT:  Head: Normocephalic and atraumatic.  Mouth/Throat: Uvula is midline, oropharynx is clear and moist and mucous membranes are normal. Mucous membranes are  not pale, not dry and not cyanotic.  Eyes: Pupils are equal, round, and reactive to light. Conjunctivae, EOM and lids are normal.  Neck: Trachea normal, normal range of motion and full passive range of motion without pain. Neck supple. No JVD present. No tracheal deviation, no edema and no erythema present. No thyromegaly present.  Cardiovascular: Normal rate, intact distal pulses and normal pulses. An irregular rhythm present. Exam reveals no gallop, no distant heart sounds and no friction rub.  Murmur heard. Pulses:      Dorsalis pedis pulses are 2+ on the right side, and 2+ on the left side.  No edema; pacemaker  Pulmonary/Chest: Effort normal and breath sounds normal. No accessory muscle usage. No respiratory distress. She has no decreased breath sounds. She has no wheezes. She has no rhonchi. She has no rales. She exhibits no tenderness.  Abdominal: Soft. Normal appearance and bowel sounds are normal. She exhibits no distension and no ascites. There is no tenderness.  Musculoskeletal: Normal range of motion. She exhibits no edema or tenderness.  Expected osteoarthritis, stiffness; Bilateral Calves soft, supple. Negative Homan's Sign. B- pedal pulses equal; ambulatory with rolling walker  Neurological: She is alert and oriented to person, place, and time. She has normal strength. Coordination and gait abnormal.  Skin: Skin is warm, dry and intact. She is not diaphoretic. No cyanosis. No pallor. Nails show no clubbing.  Psychiatric: She has a normal mood and affect. Her speech is normal and behavior is normal. Judgment and thought content normal. Cognition and memory are normal.  Nursing note and vitals reviewed.   Labs reviewed: Recent Labs    06/20/16 0552  12/24/16 1144 01/23/17 1300 03/31/17 1418  NA 136   < > 137 137 138  K 3.5   < > 3.5 4.2 4.0  CL 102   < > 98* 102 101  CO2 28   < > 29 28 28   GLUCOSE 125*   < > 112* 95 88  BUN 16   < > 13 13 14   CREATININE 0.45   < > 0.62  0.57 0.75  CALCIUM 7.6*   < > 8.8* 8.9 9.4  MG 1.7  --   --   --   --    < > = values in this interval not displayed.   Recent Labs  09/16/16 1410 12/24/16 1144 03/31/17 1418  AST 19 17 18   ALT 6* 5* <5*  ALKPHOS 50 73 68  BILITOT 0.5 0.7 0.4  PROT 7.4 6.9 7.6  ALBUMIN 3.8 3.6 3.8   Recent Labs    09/16/16 1410  10/04/16 0416 12/24/16 1144 03/31/17 1418  WBC 9.2   < > 11.9* 7.3 7.9  NEUTROABS 4.3  --   --  4.2 4.4  HGB 13.0   < > 12.2 13.1 13.6  HCT 38.9   < > 36.8 39.5 40.7  MCV 94.6   < > 95.7 89.8 95.6  PLT 299   < > 220 298 345   < > = values in this interval not displayed.   Lab Results  Component Value Date   TSH 2.420 09/30/2016   No results found for: HGBA1C No results found for: CHOL, HDL, LDLCALC, LDLDIRECT, TRIG, CHOLHDL  Significant Diagnostic Results in last 30 days:  No results found.  Assessment/Plan Laura Cisneros was seen today for medical management of chronic issues.  Diagnoses and all orders for this visit:  AF (paroxysmal atrial fibrillation) (HCC)  Dysphagia, oropharyngeal phase  Primary osteoarthritis involving multiple joints   Above listed conditions stable  Continue current medication regimen, except  Schedule Tylenol 650 mg po TID for OA pain  Monitor for choking or aspiration with po intake  Monitor for exacerbations of a-fib  Monitor for worsening pain  Monitor for abnormal bleeding  Family/ staff Communication:   Total Time:  Documentation:  Face to Face:  Family/Phone:   Labs/tests ordered:  Recent labs reviewed  Medication list reviewed and assessed for continued appropriateness. Monthly medication orders reviewed and signed.  Vikki Ports, NP-C Geriatrics [facility] [phone], [place] [address] Cell Phone (Mon-Fri 8am-5pm):  [phone] On Call:  [phone] & follow prompts after 5pm & weekends Office Phone:  [phone] Office Fax:   [phone]

## 2017-04-30 NOTE — Assessment & Plan Note (Signed)
Stable. No recent exacerbation of CHF symptoms. Pt is on Lasix 40 mg po Q Day with Potassium chloride 20 mEq PO Q Day. No edema noted.

## 2017-05-28 ENCOUNTER — Encounter
Admission: RE | Admit: 2017-05-28 | Discharge: 2017-05-28 | Disposition: A | Discharge status: Home / Self Care | Payer: Medicare Other | Source: Ambulatory Visit | Attending: Internal Medicine | Admitting: Internal Medicine

## 2017-06-04 ENCOUNTER — Encounter: Payer: Self-pay | Admitting: Gerontology

## 2017-06-04 ENCOUNTER — Non-Acute Institutional Stay (SKILLED_NURSING_FACILITY): Payer: Medicare Other | Admitting: Gerontology

## 2017-06-04 DIAGNOSIS — G20 Parkinson's disease: Secondary | ICD-10-CM

## 2017-06-04 DIAGNOSIS — M810 Age-related osteoporosis without current pathological fracture: Secondary | ICD-10-CM

## 2017-06-04 DIAGNOSIS — N952 Postmenopausal atrophic vaginitis: Secondary | ICD-10-CM | POA: Diagnosis not present

## 2017-06-05 NOTE — Assessment & Plan Note (Signed)
Stable. Pt has generalized weakness. Minimal tremors. On Sinemet CR 50-200 3 tablets TID.

## 2017-06-05 NOTE — Assessment & Plan Note (Signed)
Stable. No recent UTIs. Pt continues on Premarin 0.5 mg vaginal cream Q Monday HS

## 2017-06-05 NOTE — Assessment & Plan Note (Signed)
Stable. On Oscal 1500 mg 2 tablets po Q Day and Cholecalciferol 400 units- 2 tablets po Q Day. No recent fractures.

## 2017-06-05 NOTE — Progress Notes (Signed)
Location:    Nursing Home Room Number: 630Z Place of Service:  SNF (31) Provider:  Toni Arthurs, NP-C  Kirk Ruths, MD  Patient Care Team: Kirk Ruths, MD as PCP - General (Internal Medicine) Toni Arthurs, NP as Nurse Practitioner [facility])  Extended Emergency Contact Information Primary Emergency Contact: Cisneros,Laura L Address: [address]          [place], [place] [address] [name] Phone: [phone] Relation: Son Secondary Emergency Contact: Cisneros,Laura Address: [address]          [place], [place] [address] [name] Phone: [phone] Work Phone: [phone] Mobile Phone: [phone] Relation: Son  Code Status:  Full Goals of care: Advanced Directive information Advanced Directives 06/04/2017  Does Patient Have a Medical Advance Directive? No  Type of Advance Directive -  Does patient want to make changes to medical advance directive? -  Copy of [place] in Chart? -  Would patient like information on creating a medical advance directive? No - Patient declined     Chief Complaint  Patient presents with  . Medical Management of Chronic Issues    Routine Visit    HPI:  Pt is a 84 y.o. female seen today for medical management of chronic diseases.    Parkinson disease ([place]) Stable. Pt has generalized weakness. Minimal tremors. On Sinemet CR 50-200 3 tablets TID.   Osteoporosis without current pathological fracture Stable. On Oscal 1500 mg 2 tablets po Q Day and Cholecalciferol 400 units- 2 tablets po Q Day. No recent fractures.   Post-menopausal atrophic vaginitis Stable. No recent UTIs. Pt continues on Premarin 0.5 mg vaginal cream Q Monday HS    Past Medical History:  Diagnosis Date  . A-fib ([place])    unspecified  . Anemia    unspecified  . Arthritis    osteoarthritis  . Atrophic vaginitis   . Bleeding hemorrhoid   . Breast cancer ([place])    unspecified    . Cancer ([place])    Breast  . Cataract   . Diverticulosis   . DVT (deep vein thrombosis) in pregnancy ([place])   . Hiatal hernia   . Hypertension   . Incontinence   . Osteoarthritis   . Osteoporosis    a. Intolerance to Fosamax b. Bilateral foot fractures c. IV Boniva d. Low vitamin D e. Reclast  . Parkinson disease ([place])   . PE (pulmonary thromboembolism) ([place])   . Scleritis    Idiopathic a. Prednisone b. s/p methotrexate  . TIA (transient ischemic attack) 1997   Possible  . Urinary urgency   . Vitamin B12 deficiency    Past Surgical History:  Procedure Laterality Date  . ABDOMINAL HYSTERECTOMY    . ABDOMINAL HYSTERECTOMY     partial  . BLADDER TACK SURGERY     Dr. Ouida Sills  . BREAST BIOPSY Left 10/2012   benign  . BREAST BIOPSY Left 11/22/2015   invasive mammary ca   . BREAST EXCISIONAL BIOPSY Left 12/08/2015   lumpectomy  . BREAST LUMPECTOMY Left 11/2015   Invasive mammary carcinoma Grade I  . EYE SURGERY Bilateral    Catarct Extraction with IOL  . FINGER SURGERY Right   . FOOT FRACTURE SURGERY Right 06/2003   pinning, Dr. Francia Greaves, [facility]  . FOOT SURGERY Left   . INCONTINENCE SURGERY    . NEUROMA SURGERY    . PACEMAKER INSERTION Right 10/02/2016   Procedure: INSERTION PACEMAKER;  Surgeon: Isaias Cowman,  MD;  Location: [facility] ORS;  Service: Cardiovascular;  Laterality: Right;  . PARTIAL MASTECTOMY WITH AXILLARY SENTINEL LYMPH NODE BIOPSY Left 12/08/2015   Procedure: PARTIAL MASTECTOMY WITH AXILLARY SENTINEL LYMPH NODE BIOPSY;  Surgeon: [name], MD;  Location: [facility] ORS;  Service: General;  Laterality: Left;    No Known Allergies  Allergies as of 06/04/2017   No Known Allergies     Medication List        Accurate as of 06/04/17 11:59 PM. Always use your most recent med list.          acetaminophen 325 MG tablet Commonly known as:  TYLENOL Take 650 mg by mouth 3 (three) times daily as needed.   alum & mag hydroxide-simeth 938-182-99 MG/5ML  suspension Commonly known as:  MAALOX PLUS Take 30 mLs every 4 (four) hours as needed by mouth for indigestion.   ASPERCREME W/LIDOCAINE 4 % Crea Generic drug:  Lidocaine HCl Apply liberal amount topically  to shoulders/ neck or any other area of pain TID prn for pain/OA   calcium carbonate 1500 (600 Ca) MG Tabs tablet Commonly known as:  OSCAL Take 2 tablets by mouth daily.   carbidopa-levodopa 50-200 MG tablet Commonly known as:  SINEMET CR Take 3 tablets by mouth 3 (three) times daily.   cholecalciferol 400 units Tabs tablet Commonly known as:  VITAMIN D Take 800 Units by mouth daily. Per cancer center office visit note on 11/27   CLEAR EYES COMPLETE Soln Place 2 drops every 2 (two) hours as needed into both eyes. For dry, irritated eyes. Ok to leave at bedside.   cyanocobalamin 1000 MCG/ML injection Commonly known as:  (VITAMIN B-12) Inject 1,000 mcg into the skin every 30 (thirty) days. On 1st of the month   DERMACLOUD Crea Apply liberal amount to area of skin irritation topically as needed. Ok to leave at bedside.   diltiazem 120 MG tablet Commonly known as:  CARDIZEM Take 120 mg by mouth daily.   furosemide 40 MG tablet Commonly known as:  LASIX Take 40 mg by mouth daily.   latanoprost 0.005 % ophthalmic solution Commonly known as:  XALATAN Place 1 drop into both eyes at bedtime. Per Dr. Wallace Going   Melatonin 3 MG Tabs Take 3 mg by mouth at bedtime.   mirabegron ER 25 MG Tb24 tablet Commonly known as:  MYRBETRIQ Take 25 mg by mouth daily.   potassium chloride SA 20 MEQ tablet Commonly known as:  K-DUR,KLOR-CON Take 20 mEq by mouth daily.   PREMARIN vaginal cream Generic drug:  conjugated estrogens Apply 0.5 mg ( pea-sized amount) just inside the vaginal introitus with finger-tip weekly on Monday nights   rOPINIRole 0.5 MG tablet Commonly known as:  REQUIP Take 1 tablet (0.5 mg total) by mouth at bedtime.   XARELTO 20 MG Tabs tablet Generic drug:   rivaroxaban Take 20 mg by mouth daily.       Review of Systems  Constitutional: Negative for activity change, appetite change, chills, diaphoresis and fever.  HENT: Negative for congestion, mouth sores, nosebleeds, postnasal drip, sneezing, sore throat, trouble swallowing and voice change.   Respiratory: Negative for apnea, cough, choking, chest tightness, shortness of breath and wheezing.   Cardiovascular: Negative for chest pain, palpitations and leg swelling.  Gastrointestinal: Negative for abdominal distention, abdominal pain, constipation, diarrhea and nausea.  Genitourinary: Negative for difficulty urinating, dysuria, frequency and urgency.  Musculoskeletal: Positive for arthralgias (typical arthritis) and gait problem. Negative for back pain and myalgias.  Skin: Negative for color change, pallor, rash and wound.  Neurological: Positive for weakness. Negative for dizziness, tremors, syncope, speech difficulty, numbness and headaches.  Psychiatric/Behavioral: Negative for agitation and behavioral problems.  All other systems reviewed and are negative.   Immunization History  Administered Date(s) Administered  . Influenza-Unspecified 10/22/2016  . Pneumococcal Conjugate-13 11/18/2013   Pertinent  Health Maintenance Due  Topic Date Due  . PNA vac Low Risk Adult (2 of 2 - PPSV23) 11/19/2014  . INFLUENZA VACCINE  08/28/2017  . DEXA SCAN  Completed   No flowsheet data found. Functional Status Survey:    Vitals:   06/04/17 1032  BP: 135/84  Pulse: 86  Resp: 18  Temp: 97.6 F (36.4 C)  TempSrc: Oral  SpO2: 97%  Weight: 122 lb 14.4 oz (55.7 kg)  Height: 5' (1.524 m)   Body mass index is 24 kg/m. Physical Exam  Constitutional: She is oriented to person, place, and time. Vital signs are normal. She appears well-developed and well-nourished. She is active and cooperative. She does not appear ill. No distress.  HENT:  Head: Normocephalic and atraumatic.  Mouth/Throat:  Uvula is midline, oropharynx is clear and moist and mucous membranes are normal. Mucous membranes are not pale, not dry and not cyanotic.  Eyes: Pupils are equal, round, and reactive to light. Conjunctivae, EOM and lids are normal.  Neck: Trachea normal, normal range of motion and full passive range of motion without pain. Neck supple. No JVD present. No tracheal deviation, no edema and no erythema present. No thyromegaly present.  Cardiovascular: Normal rate, normal heart sounds, intact distal pulses and normal pulses. An irregular rhythm present. Exam reveals no gallop, no distant heart sounds and no friction rub.  No murmur heard. Pulses:      Dorsalis pedis pulses are 2+ on the right side, and 2+ on the left side.  No edema  Pulmonary/Chest: Effort normal and breath sounds normal. No accessory muscle usage. No respiratory distress. She has no decreased breath sounds. She has no wheezes. She has no rhonchi. She has no rales. She exhibits no tenderness.  Abdominal: Soft. Normal appearance and bowel sounds are normal. She exhibits no distension and no ascites. There is no tenderness.  Musculoskeletal: Normal range of motion. She exhibits no edema or tenderness.  Expected osteoarthritis, stiffness; Bilateral Calves soft, supple. Negative Homan's Sign. B- pedal pulses equal; generalized weakness; mobile in wheelchair  Neurological: She is alert and oriented to person, place, and time. She has normal strength. She displays atrophy. She exhibits abnormal muscle tone. Coordination and gait abnormal.  Skin: Skin is warm, dry and intact. She is not diaphoretic. No cyanosis. No pallor. Nails show no clubbing.  Psychiatric: She has a normal mood and affect. Her speech is normal and behavior is normal. Judgment and thought content normal. Cognition and memory are impaired.  Nursing note and vitals reviewed.   Labs reviewed: Recent Labs    06/20/16 0552  12/24/16 1144 01/23/17 1300 03/31/17 1418  NA  136   < > 137 137 138  K 3.5   < > 3.5 4.2 4.0  CL 102   < > 98* 102 101  CO2 28   < > 29 28 28   GLUCOSE 125*   < > 112* 95 88  BUN 16   < > 13 13 14   CREATININE 0.45   < > 0.62 0.57 0.75  CALCIUM 7.6*   < > 8.8* 8.9 9.4  MG 1.7  --   --   --   --    < > =  values in this interval not displayed.   Recent Labs    09/16/16 1410 12/24/16 1144 03/31/17 1418  AST 19 17 18   ALT 6* 5* <5*  ALKPHOS 50 73 68  BILITOT 0.5 0.7 0.4  PROT 7.4 6.9 7.6  ALBUMIN 3.8 3.6 3.8   Recent Labs    09/16/16 1410  10/04/16 0416 12/24/16 1144 03/31/17 1418  WBC 9.2   < > 11.9* 7.3 7.9  NEUTROABS 4.3  --   --  4.2 4.4  HGB 13.0   < > 12.2 13.1 13.6  HCT 38.9   < > 36.8 39.5 40.7  MCV 94.6   < > 95.7 89.8 95.6  PLT 299   < > 220 298 345   < > = values in this interval not displayed.   Lab Results  Component Value Date   TSH 2.420 09/30/2016   No results found for: HGBA1C No results found for: CHOL, HDL, LDLCALC, LDLDIRECT, TRIG, CHOLHDL  Significant Diagnostic Results in last 30 days:  No results found.  Assessment/Plan Laura Cisneros was seen today for medical management of chronic issues.  Diagnoses and all orders for this visit:  Parkinson disease ([place])  Age-related osteoporosis without current pathological fracture  Post-menopausal atrophic vaginitis   above listed conditions stable  Continue current medication regimen  OOB to chair at least daily  Assist with ADLs as appropriate  Safety precautions  Fall precautions   Family/ staff Communication:   Total Time:  Documentation:  Face to Face:  Family/Phone:   Labs/tests ordered:  Recent labs from outside MD reviewed- stable  Medication list reviewed and assessed for continued appropriateness. Monthly medication orders reviewed and signed.  Vikki Ports, NP-C Geriatrics [facility] [phone], [place] [address] Cell Phone (Mon-Fri 8am-5pm):  [phone] On Call:   [phone] & follow prompts after 5pm & weekends Office Phone:  [phone] Office Fax:  [phone]

## 2017-06-26 ENCOUNTER — Inpatient Hospital Stay: Payer: Medicare Other | Attending: Hematology and Oncology

## 2017-06-26 ENCOUNTER — Inpatient Hospital Stay: Payer: Medicare Other

## 2017-06-26 ENCOUNTER — Inpatient Hospital Stay (HOSPITAL_BASED_OUTPATIENT_CLINIC_OR_DEPARTMENT_OTHER): Payer: Medicare Other | Admitting: Hematology and Oncology

## 2017-06-26 ENCOUNTER — Encounter: Payer: Self-pay | Admitting: Hematology and Oncology

## 2017-06-26 VITALS — BP 116/76 | HR 69 | Temp 97.4°F | Resp 18 | Ht 60.0 in | Wt 120.0 lb

## 2017-06-26 DIAGNOSIS — G20 Parkinson's disease: Secondary | ICD-10-CM | POA: Diagnosis not present

## 2017-06-26 DIAGNOSIS — M810 Age-related osteoporosis without current pathological fracture: Secondary | ICD-10-CM | POA: Diagnosis not present

## 2017-06-26 DIAGNOSIS — C50412 Malignant neoplasm of upper-outer quadrant of left female breast: Secondary | ICD-10-CM

## 2017-06-26 DIAGNOSIS — Z7901 Long term (current) use of anticoagulants: Secondary | ICD-10-CM | POA: Insufficient documentation

## 2017-06-26 DIAGNOSIS — Z86711 Personal history of pulmonary embolism: Secondary | ICD-10-CM | POA: Insufficient documentation

## 2017-06-26 DIAGNOSIS — Z86718 Personal history of other venous thrombosis and embolism: Secondary | ICD-10-CM | POA: Insufficient documentation

## 2017-06-26 DIAGNOSIS — G620 Drug-induced polyneuropathy: Secondary | ICD-10-CM | POA: Insufficient documentation

## 2017-06-26 DIAGNOSIS — Z170 Estrogen receptor positive status [ER+]: Secondary | ICD-10-CM | POA: Insufficient documentation

## 2017-06-26 LAB — CBC WITH DIFFERENTIAL/PLATELET
Basophils Absolute: 0.1 10*3/uL (ref 0–0.1)
Basophils Relative: 1 %
Eosinophils Absolute: 0.3 10*3/uL (ref 0–0.7)
Eosinophils Relative: 4 %
HCT: 37.9 % (ref 35.0–47.0)
Hemoglobin: 13 g/dL (ref 12.0–16.0)
Lymphocytes Relative: 31 %
Lymphs Abs: 2.5 10*3/uL (ref 1.0–3.6)
MCH: 32.6 pg (ref 26.0–34.0)
MCHC: 34.3 g/dL (ref 32.0–36.0)
MCV: 94.9 fL (ref 80.0–100.0)
Monocytes Absolute: 0.7 10*3/uL (ref 0.2–0.9)
Monocytes Relative: 8 %
Neutro Abs: 4.5 10*3/uL (ref 1.4–6.5)
Neutrophils Relative %: 56 %
Platelets: 298 10*3/uL (ref 150–440)
RBC: 3.99 MIL/uL (ref 3.80–5.20)
RDW: 14.7 % — ABNORMAL HIGH (ref 11.5–14.5)
WBC: 8.1 10*3/uL (ref 3.6–11.0)

## 2017-06-26 LAB — COMPREHENSIVE METABOLIC PANEL
ALT: <5 U/L — ABNORMAL LOW (ref 14–54)
AST: 19 U/L (ref 15–41)
Albumin: 3.6 g/dL (ref 3.5–5.0)
Alkaline Phosphatase: 65 U/L (ref 38–126)
Anion gap: 12 (ref 5–15)
BUN: 15 mg/dL (ref 6–20)
CO2: 26 mmol/L (ref 22–32)
Calcium: 9.2 mg/dL (ref 8.9–10.3)
Chloride: 99 mmol/L — ABNORMAL LOW (ref 101–111)
Creatinine, Ser: 0.95 mg/dL (ref 0.44–1.00)
GFR calc Af Amer: 59 mL/min — ABNORMAL LOW (ref >60)
GFR calc non Af Amer: 51 mL/min — ABNORMAL LOW (ref >60)
Glucose, Bld: 147 mg/dL — ABNORMAL HIGH (ref 65–99)
Potassium: 3.9 mmol/L (ref 3.5–5.1)
Sodium: 137 mmol/L (ref 135–145)
Total Bilirubin: 0.5 mg/dL (ref 0.3–1.2)
Total Protein: 7.2 g/dL (ref 6.5–8.1)

## 2017-06-26 NOTE — Progress Notes (Signed)
[facility] day:  06/26/2017   Chief Complaint: Laura Cisneros is a 84 y.o. female with clinical stage I left breast cancer and osteoporosis who is seen for 3 month assessment.  HPI: The patient was last seen in the medical oncology clinic on 03/31/2017.  At that time, she felt "alright".  Patient had no acute complaints today. Patient denied breast concerns.  Exam revealed fibrocystic changes in the LEFT breast.  Labs were unremarkable.    During the interim, patient has been doing "ok for a 84 year old woman". Patient notes that some days she feels well, and others she feels "yucky". She notes that she doesn't have much "get up and go". Patient states, "Sometimes it takes me until 1 or 2 o'clock to get going.   Patient has been seen in follow up consult by surgery. She notes that she was dismissed and that the surgeon would only see her on an as needed basis.   Patient does not verbalize any concerns with regards to her breasts today. Patient performs monthly self breast examinations as recommended.  She has experienced no B symptoms or interval infections. Patient continues on her Xarelto as prescribed. Patient is on calcium and vitamin D supplementation.   She is using Premarin cream once a week for vaginal atrophy. She is aware of the estrogen content of this intervention.   Patient is eating well. Her weight is down 3 pounds. Patient denies pain in the clinic today.    Past Medical History:  Diagnosis Date  . A-fib ([place])    unspecified  . Anemia    unspecified  . Arthritis    osteoarthritis  . Atrophic vaginitis   . Bleeding hemorrhoid   . Breast cancer ([place])    unspecified  . Cancer ([place])    Breast  . Cataract   . Diverticulosis   . DVT (deep vein thrombosis) in pregnancy ([place])   . Hiatal hernia   . Hypertension   . Incontinence   . Osteoarthritis   . Osteoporosis    a. Intolerance to Fosamax b. Bilateral foot fractures c. IV  Boniva d. Low vitamin D e. Reclast  . Parkinson disease ([place])   . PE (pulmonary thromboembolism) ([place])   . Scleritis    Idiopathic a. Prednisone b. s/p methotrexate  . TIA (transient ischemic attack) 1997   Possible  . Urinary urgency   . Vitamin B12 deficiency     Past Surgical History:  Procedure Laterality Date  . ABDOMINAL HYSTERECTOMY    . ABDOMINAL HYSTERECTOMY     partial  . BLADDER TACK SURGERY     Dr. Ouida Sills  . BREAST BIOPSY Left 10/2012   benign  . BREAST BIOPSY Left 11/22/2015   invasive mammary ca   . BREAST EXCISIONAL BIOPSY Left 12/08/2015   lumpectomy  . BREAST LUMPECTOMY Left 11/2015   Invasive mammary carcinoma Grade I  . EYE SURGERY Bilateral    Catarct Extraction with IOL  . FINGER SURGERY Right   . FOOT FRACTURE SURGERY Right 06/2003   pinning, Dr. Francia Greaves, [facility]  . FOOT SURGERY Left   . INCONTINENCE SURGERY    . NEUROMA SURGERY    . PACEMAKER INSERTION Right 10/02/2016   Procedure: INSERTION PACEMAKER;  Surgeon: Isaias Cowman, MD;  Location: [facility] ORS;  Service: Cardiovascular;  Laterality: Right;  . PARTIAL MASTECTOMY WITH AXILLARY SENTINEL LYMPH NODE BIOPSY Left 12/08/2015   Procedure: PARTIAL MASTECTOMY WITH AXILLARY SENTINEL LYMPH NODE  BIOPSY;  Surgeon: [name], MD;  Location: [facility] ORS;  Service: General;  Laterality: Left;    Family History  Problem Relation Age of Onset  . Esophageal cancer Sister   . Kidney disease Neg Hx   . Bladder Cancer Neg Hx   . Breast cancer Neg Hx     Social History:  reports that she has never smoked. She has never used smokeless tobacco. She reports that she does not drink alcohol or use drugs.  She denies any exposure to radiation or toxins.  She previously worked in a [employer].  She lives at the [place] at [place].  Her son's phone number is [phone].  Her grand daughter is [name].  The patient is accompanied by her son, Laura Cisneros.  Allergies: No Known Allergies  Current Medications: Current  Outpatient Medications  Medication Sig Dispense Refill  . calcium carbonate (OSCAL) 1500 (600 Ca) MG TABS tablet Take 2 tablets by mouth daily.    . carbidopa-levodopa (SINEMET CR) 50-200 MG tablet Take 3 tablets by mouth 3 (three) times daily.    . cholecalciferol (VITAMIN D) 400 units TABS tablet Take 800 Units by mouth daily. Per cancer center office visit note on 11/27    . cyanocobalamin (,VITAMIN B-12,) 1000 MCG/ML injection Inject 1,000 mcg into the skin every 30 (thirty) days. On 1st of the month    . diltiazem (CARDIZEM) 120 MG tablet Take 120 mg by mouth daily.    . furosemide (LASIX) 40 MG tablet Take 40 mg by mouth daily.     Marland Kitchen latanoprost (XALATAN) 0.005 % ophthalmic solution Place 1 drop into both eyes at bedtime. Per Dr. Wallace Going    . Lidocaine HCl (ASPERCREME W/LIDOCAINE) 4 % CREA Apply liberal amount topically  to shoulders/ neck or any other area of pain TID prn for pain/OA    . Melatonin 3 MG TABS Take 3 mg by mouth at bedtime.    . mirabegron ER (MYRBETRIQ) 25 MG TB24 tablet Take 25 mg by mouth daily.    . potassium chloride SA (K-DUR,KLOR-CON) 20 MEQ tablet Take 20 mEq by mouth daily.     Marland Kitchen rOPINIRole (REQUIP) 0.5 MG tablet Take 1 tablet (0.5 mg total) by mouth at bedtime.    Alveda Reasons 20 MG TABS tablet Take 20 mg by mouth daily.   0  . acetaminophen (TYLENOL) 325 MG tablet Take 650 mg by mouth 3 (three) times daily as needed.    Marland Kitchen alum & mag hydroxide-simeth (MAALOX PLUS) 400-400-40 MG/5ML suspension Take 30 mLs every 4 (four) hours as needed by mouth for indigestion.    . conjugated estrogens (PREMARIN) vaginal cream Apply 0.5 mg ( pea-sized amount) just inside the vaginal introitus with finger-tip weekly on Monday nights    . Hyprom-Naphaz-Polysorb-Zn Sulf (CLEAR EYES COMPLETE) SOLN Place 2 drops every 2 (two) hours as needed into both eyes. For dry, irritated eyes. Ok to leave at bedside.    . [place] (DERMACLOUD) CREA Apply liberal amount to area of skin  irritation topically as needed. Ok to leave at bedside.     No current facility-administered medications for this visit.     Review of Systems  Constitutional: Positive for weight loss (down 3 pounds). Negative for diaphoresis, fever and malaise/fatigue.       "Alright for a 84 year old woman".   HENT: Negative.  Negative for congestion, sinus pain and sore throat.   Eyes: Negative.  Negative for double vision, pain and redness.  Respiratory: Negative for cough, hemoptysis, sputum production and shortness of breath.   Cardiovascular: Negative for chest pain, palpitations, orthopnea, leg swelling and PND.  Gastrointestinal: Negative for abdominal pain, blood in stool, constipation, diarrhea, melena, nausea and vomiting.  Genitourinary: Negative for dysuria, frequency, hematuria and urgency.  Musculoskeletal: Positive for joint pain. Negative for back pain, falls and myalgias.  Skin: Negative for itching and rash.  Neurological: Negative for dizziness, tremors, weakness and headaches.  Endo/Heme/Allergies: Does not bruise/bleed easily.  Psychiatric/Behavioral: Negative for depression, memory loss and suicidal ideas. The patient is not nervous/anxious and does not have insomnia.   All other systems reviewed and are negative.  Performance status (ECOG): 2 - Symptomatic, <50% confined to bed  Physical Exam: Blood pressure 116/76, pulse 69, temperature (!) 97.4 F (36.3 C), temperature source Tympanic, resp. rate 18, height 5' (1.524 m), weight 120 lb (54.4 kg), SpO2 98 %. GENERAL:  Thin elderly woman sitting comfortably in a wheelchair in the exam room in no acute distress.  She is examined in her chair. MENTAL STATUS:  Alert and oriented to person, place and time. HEAD:  Curly gray hair.  Normocephalic, atraumatic, face symmetric, no Cushingoid features. EYES: Laura Cisneros eyes.  Pupils equal round and reactive to light and accomodation.  No conjunctivitis or scleral icterus. ENT:  Oropharynx clear  without lesion.  Tongue normal. Mucous membranes moist.  RESPIRATORY:  Clear to auscultation without rales, wheezes or rhonchi. CARDIOVASCULAR:  Regular rate and rhythm without murmur, rub or gallop. BREAST:  Right breast with fibrocystic changes.  No masses, skin changes or nipple discharge.  Left breast s/p lumpectomy.  Fibrocystic changes laterally.  No masses, skin changes or nipple discharge. ABDOMEN:  Soft, non-tender, with active bowel sounds, and no hepatosplenomegaly.  No masses. SKIN:  No rashes, ulcers or lesions. EXTREMITIES: No edema, no skin discoloration or tenderness.  No palpable cords. LYMPH NODES: No palpable cervical, supraclavicular, axillary or inguinal adenopathy  NEUROLOGICAL: Unremarkable. PSYCH:  Appropriate.    Appointment on 06/26/2017  Component Date Value Ref Range Status  . WBC 06/26/2017 8.1  3.6 - 11.0 K/uL Final  . RBC 06/26/2017 3.99  3.80 - 5.20 MIL/uL Final  . Hemoglobin 06/26/2017 13.0  12.0 - 16.0 g/dL Final  . HCT 06/26/2017 37.9  35.0 - 47.0 % Final  . MCV 06/26/2017 94.9  80.0 - 100.0 fL Final  . MCH 06/26/2017 32.6  26.0 - 34.0 pg Final  . MCHC 06/26/2017 34.3  32.0 - 36.0 g/dL Final  . RDW 06/26/2017 14.7* 11.5 - 14.5 % Final  . Platelets 06/26/2017 298  150 - 440 K/uL Final  . Neutrophils Relative % 06/26/2017 56  % Final  . Neutro Abs 06/26/2017 4.5  1.4 - 6.5 K/uL Final  . Lymphocytes Relative 06/26/2017 31  % Final  . Lymphs Abs 06/26/2017 2.5  1.0 - 3.6 K/uL Final  . Monocytes Relative 06/26/2017 8  % Final  . Monocytes Absolute 06/26/2017 0.7  0.2 - 0.9 K/uL Final  . Eosinophils Relative 06/26/2017 4  % Final  . Eosinophils Absolute 06/26/2017 0.3  0 - 0.7 K/uL Final  . Basophils Relative 06/26/2017 1  % Final  . Basophils Absolute 06/26/2017 0.1  0 - 0.1 K/uL Final   Performed at [facility], [address] [place], [place] [address]  . Sodium 06/26/2017 137  135 - 145 mmol/L Final  . Potassium 06/26/2017 3.9  3.5 - 5.1  mmol/L Final  . Chloride 06/26/2017 99* 101 -  111 mmol/L Final  . CO2 06/26/2017 26  22 - 32 mmol/L Final  . Glucose, Bld 06/26/2017 147* 65 - 99 mg/dL Final  . BUN 06/26/2017 15  6 - 20 mg/dL Final  . Creatinine, Ser 06/26/2017 0.95  0.44 - 1.00 mg/dL Final  . Calcium 06/26/2017 9.2  8.9 - 10.3 mg/dL Final  . Total Protein 06/26/2017 7.2  6.5 - 8.1 g/dL Final  . Albumin 06/26/2017 3.6  3.5 - 5.0 g/dL Final  . AST 06/26/2017 19  15 - 41 U/L Final  . ALT 06/26/2017 <5* 14 - 54 U/L Final  . Alkaline Phosphatase 06/26/2017 65  38 - 126 U/L Final  . Total Bilirubin 06/26/2017 0.5  0.3 - 1.2 mg/dL Final  . GFR calc non Af Amer 06/26/2017 51* >60 mL/min Final  . GFR calc Af Amer 06/26/2017 59* >60 mL/min Final   Comment: (NOTE) The eGFR has been calculated using the CKD EPI equation. This calculation has not been validated in all clinical situations. eGFR's persistently <60 mL/min signify possible Chronic Kidney Disease.   Georgiann Hahn gap 06/26/2017 12  5 - 15 Final   Performed at [facility] [address] [place] [place], [place] [address]    Assessment:  Laura Cisneros is a 84 y.o. female with stage I left breast cancer s/p partial mastectomy and sentinel lymph node biopsy on 12/08/2015.  Pathology revealed a 0.7 cm grade I  invasive mammary carcinoma of no special type.  There was no lymphvascular invasion.  Margins were clear.  One sentinel lymph node was negative.  Tumor was ER positive (> 90%), PR negative, and HER-2/neu 2+ (negative by FISH).  Pathologic stage was T1bN0.  CA 27.29 was 27.6 on 12/08/2015.  Bilateral diagnostic mammogram on 11/06/2015 revealed a suspicious 5 mm mass in the upper outer left breast.  Bilateral mammogram on 11/08/2016 revealed no evidence of malignancy in either breast.  Radiation therapy was deferred secondary to her age, Parkinson's disease, and small well differentiated ER/PR+ tumor.  She began tamoxifen on 01/12/2016.  Tamoxifen was discontinued on  06/18/2016 after a DVT and pulmonary embolism.  She declined further endocrine therapy.  CA27.29 has been followed: 27.6 on 12/04/2015, 24.4 on 05/17/2016, 32.3 on 09/16/2016, 27.9 on 12/24/2016, and 28 on 03/31/2017.  She has osteoporosis and was previously on Zometa for 5 years.  Bone density study on 12/05/2015 revealed osteoporosis with a T-score of -2.9 in AP spine L1-L4 and -2.5 in the right femoral neck.  She began Prolia on 12/19/2015 (last injection on 12/24/2016).  She has Parkinson's disease.  Gait is unstable.  She has a history of recurrent UTIs, vaginal atrophy, and incontinence.  She is using estrogen cream sparingly (once a week).  She has a history of iron deficiency.  She notes 2 colonoscopies in the past. She has been on B12 shots for years.   She was diagnosed with a DVT and pulmonary embolism on 06/05/2016.  Right lower extremity duplex revealed a DVT in the right lower calf veins.  CT angiogram revealed an acute embolism of the right lower lobar artery extending into the anterior, lateral and posterior basal segmental arteries within the right upper lobe anterior segmental artery.  There was no CT evidence of acute right heart strain.  She is on Xarelto.  Symptomatically, she feels "alright".  Patient has no acute complaints today. Patient denies breast concerns. Exam reveals fibrocystic changes in the LEFT breast.Creatinine has increased from 0.75 to 0.95.  Creatinine clearance  is 28.3 ml/min.  Plan: 1. Labs today:  CBC with diff, CMP. 2. Schedule mammogram 11/08/2017. 3. Schedule bone density on 12/04/2017. 4. Discuss  calcium and Vitamin D supplementation given the patients history of osteoporosis. Patient on calcium 1200 mg daily and Vitamin D 800 units daily.  5. Discuss use of Premarin cream sparingly. 6. Discuss renal function. Creatinine 0.75 to 0.9 (CrCl 28.3 mL/min) since last visit.  Will hold Prolia today. Discussed increasing fluid intake and lab recheck next  week.  7. RTC in 1 week for labs (BMP) and +/- Prolia.  8. RTC in 6 months for MD assessment, labs (CBC with diff, CMP) and +/- Prolia.     Honor Loh, NP  06/26/2017, 2:58 PM   I saw and evaluated the patient, participating in the key portions of the service and reviewing pertinent diagnostic studies and records.  I reviewed the nurse practitioner's note and agree with the findings and the plan.  The assessment and plan were discussed with the patient.  A few questions were asked by the patient and answered.   Nolon Stalls, MD 06/26/2017,2:58 PM

## 2017-06-26 NOTE — Progress Notes (Signed)
No new changes noted today 

## 2017-06-28 ENCOUNTER — Encounter
Admission: RE | Admit: 2017-06-28 | Discharge: 2017-06-28 | Disposition: A | Discharge status: Home / Self Care | Payer: Medicare Other | Source: Ambulatory Visit | Attending: Internal Medicine | Admitting: Internal Medicine

## 2017-07-03 ENCOUNTER — Inpatient Hospital Stay: Payer: Medicare Other | Attending: Hematology and Oncology

## 2017-07-03 ENCOUNTER — Inpatient Hospital Stay: Payer: Medicare Other

## 2017-07-03 ENCOUNTER — Other Ambulatory Visit: Payer: Self-pay | Admitting: Urgent Care

## 2017-07-03 DIAGNOSIS — M810 Age-related osteoporosis without current pathological fracture: Secondary | ICD-10-CM

## 2017-07-03 DIAGNOSIS — C50412 Malignant neoplasm of upper-outer quadrant of left female breast: Secondary | ICD-10-CM | POA: Diagnosis present

## 2017-07-03 DIAGNOSIS — Z170 Estrogen receptor positive status [ER+]: Secondary | ICD-10-CM

## 2017-07-03 LAB — BASIC METABOLIC PANEL
Anion gap: 10 (ref 5–15)
BUN: 15 mg/dL (ref 6–20)
CALCIUM: 9.6 mg/dL (ref 8.9–10.3)
CO2: 25 mmol/L (ref 22–32)
CREATININE: 0.81 mg/dL (ref 0.44–1.00)
Chloride: 104 mmol/L (ref 101–111)
Glucose, Bld: 122 mg/dL — ABNORMAL HIGH (ref 65–99)
Potassium: 3.8 mmol/L (ref 3.5–5.1)
SODIUM: 139 mmol/L (ref 135–145)

## 2017-07-03 MED ORDER — DENOSUMAB 60 MG/ML ~~LOC~~ SOSY
60.0000 mg | PREFILLED_SYRINGE | Freq: Once | SUBCUTANEOUS | Status: AC
  Administered 2017-07-03: 60 mg via SUBCUTANEOUS
  Filled 2017-07-03: qty 1

## 2017-07-09 ENCOUNTER — Encounter: Payer: Self-pay | Admitting: Gerontology

## 2017-07-09 ENCOUNTER — Non-Acute Institutional Stay (SKILLED_NURSING_FACILITY): Payer: Medicare Other | Admitting: Gerontology

## 2017-07-09 DIAGNOSIS — N3281 Overactive bladder: Secondary | ICD-10-CM | POA: Diagnosis not present

## 2017-07-09 DIAGNOSIS — E538 Deficiency of other specified B group vitamins: Secondary | ICD-10-CM

## 2017-07-09 DIAGNOSIS — H04123 Dry eye syndrome of bilateral lacrimal glands: Secondary | ICD-10-CM | POA: Diagnosis not present

## 2017-07-25 NOTE — Assessment & Plan Note (Signed)
Stable. On Myrbetric. No c/o of worsening sx

## 2017-07-25 NOTE — Assessment & Plan Note (Signed)
Stable. Uses clear eyes Q 2 hours prn. No recent complaints.

## 2017-07-25 NOTE — Assessment & Plan Note (Signed)
Stable. On Cyanocobalamin 1,000 mcg IM Q 30 days. Will check levels next month

## 2017-07-25 NOTE — Progress Notes (Signed)
Location:    Nursing Home Room Number: 419F Place of Service:  SNF (31) Provider:  Toni Arthurs, NP-C  Kirk Ruths, MD  Patient Care Team: Kirk Ruths, MD as PCP - General (Internal Medicine) Toni Arthurs, NP as Nurse Practitioner [facility])  Extended Emergency Contact Information Primary Emergency Contact: Fairfax,Timothy L Address: [address]          [place], [place] [address] [name] of [place] Phone: [phone] Relation: Son Secondary Emergency Contact: Heal,James Address: [address]          [place], [place] [address] [name] of [place] Phone: [phone] Work Phone: [phone] Mobile Phone: [phone] Relation: Son  Code Status:  FULL Goals of care: Advanced Directive information Advanced Directives 07/09/2017  Does Patient Have a Medical Advance Directive? No  Type of Advance Directive -  Does patient want to make changes to medical advance directive? -  Copy of [place] in Chart? -  Would patient like information on creating a medical advance directive? -     Chief Complaint  Patient presents with  . Medical Management of Chronic Issues    Routine Visit    HPI:  Pt is a 84 y.o. female seen today for medical management of chronic diseases.    Overactive bladder Stable. On Myrbetric. No c/o of worsening sx  Deficiency of other specified B group vitamins Stable. On Cyanocobalamin 1,000 mcg IM Q 30 days. Will check levels next month  Dry eyes Stable. Uses clear eyes Q 2 hours prn. No recent complaints.     Past Medical History:  Diagnosis Date  . A-fib ([place])    unspecified  . Anemia    unspecified  . Arthritis    osteoarthritis  . Atrophic vaginitis   . Bleeding hemorrhoid   . Breast cancer ([place])    unspecified  . Cancer ([place])    Breast  . Cataract   . Diverticulosis   . DVT (deep vein thrombosis) in pregnancy ([place])   . Hiatal hernia   . Hypertension   .  Incontinence   . Osteoarthritis   . Osteoporosis    a. Intolerance to Fosamax b. Bilateral foot fractures c. IV Boniva d. Low vitamin D e. Reclast  . Parkinson disease ([name])   . PE (pulmonary thromboembolism) ([place])   . Scleritis    Idiopathic a. Prednisone b. s/p methotrexate  . TIA (transient ischemic attack) 1997   Possible  . Urinary urgency   . Vitamin B12 deficiency    Past Surgical History:  Procedure Laterality Date  . ABDOMINAL HYSTERECTOMY    . ABDOMINAL HYSTERECTOMY     partial  . BLADDER TACK SURGERY     Dr. Ouida Sills  . BREAST BIOPSY Left 10/2012   benign  . BREAST BIOPSY Left 11/22/2015   invasive mammary ca   . BREAST EXCISIONAL BIOPSY Left 12/08/2015   lumpectomy  . BREAST LUMPECTOMY Left 11/2015   Invasive mammary carcinoma Grade I  . EYE SURGERY Bilateral    Catarct Extraction with IOL  . FINGER SURGERY Right   . FOOT FRACTURE SURGERY Right 06/2003   pinning, Dr. Francia Greaves, [facility]  . FOOT SURGERY Left   . INCONTINENCE SURGERY    . NEUROMA SURGERY    . PACEMAKER INSERTION Right 10/02/2016   Procedure: INSERTION PACEMAKER;  Surgeon: Isaias Cowman, MD;  Location: [facility] ORS;  Service: Cardiovascular;  Laterality: Right;  . PARTIAL MASTECTOMY WITH AXILLARY SENTINEL LYMPH NODE BIOPSY Left 12/08/2015  Procedure: PARTIAL MASTECTOMY WITH AXILLARY SENTINEL LYMPH NODE BIOPSY;  Surgeon: [name], MD;  Location: [facility] ORS;  Service: General;  Laterality: Left;    No Known Allergies  Allergies as of 07/09/2017   No Known Allergies     Medication List        Accurate as of 07/09/17 11:59 PM. Always use your most recent med list.          acetaminophen 325 MG tablet Commonly known as:  TYLENOL Take 650 mg by mouth 3 (three) times daily as needed.   alum & mag hydroxide-simeth 673-419-37 MG/5ML suspension Commonly known as:  MAALOX PLUS Take 30 mLs every 4 (four) hours as needed by mouth for indigestion.   ASPERCREME W/LIDOCAINE 4 %  Crea Generic drug:  Lidocaine HCl Apply liberal amount topically  to shoulders/ neck or any other area of pain TID prn for pain/OA   calcium carbonate 1500 (600 Ca) MG Tabs tablet Commonly known as:  OSCAL Take 2 tablets by mouth daily.   carbidopa-levodopa 50-200 MG tablet Commonly known as:  SINEMET CR Take 3 tablets by mouth 3 (three) times daily.   cholecalciferol 400 units Tabs tablet Commonly known as:  VITAMIN D Take 800 Units by mouth daily. Per cancer center office visit note on 11/27   CLEAR EYES COMPLETE Soln Place 2 drops every 2 (two) hours as needed into both eyes. For dry, irritated eyes. Ok to leave at bedside.   cyanocobalamin 1000 MCG/ML injection Commonly known as:  (VITAMIN B-12) Inject 1,000 mcg into the skin every 30 (thirty) days. On 1st of the month   DERMACLOUD Crea Apply liberal amount to area of skin irritation topically as needed. Ok to leave at bedside.   diltiazem 120 MG tablet Commonly known as:  CARDIZEM Take 120 mg by mouth daily.   furosemide 40 MG tablet Commonly known as:  LASIX Take 40 mg by mouth daily.   latanoprost 0.005 % ophthalmic solution Commonly known as:  XALATAN Place 1 drop into both eyes at bedtime. Per Dr. Wallace Going   Melatonin 3 MG Tabs Take 3 mg by mouth at bedtime.   mirabegron ER 25 MG Tb24 tablet Commonly known as:  MYRBETRIQ Take 25 mg by mouth daily.   potassium chloride SA 20 MEQ tablet Commonly known as:  K-DUR,KLOR-CON Take 20 mEq by mouth daily.   PREMARIN vaginal cream Generic drug:  conjugated estrogens Apply 0.5 mg ( pea-sized amount) just inside the vaginal introitus with finger-tip weekly on Monday nights   rOPINIRole 0.5 MG tablet Commonly known as:  REQUIP Take 1 tablet (0.5 mg total) by mouth at bedtime.   sennosides-docusate sodium 8.6-50 MG tablet Commonly known as:  SENOKOT-S Take 1 tablet by mouth 2 (two) times daily as needed for constipation.   XARELTO 20 MG Tabs tablet Generic  drug:  rivaroxaban Take 20 mg by mouth daily.       Review of Systems  Constitutional: Negative for activity change, appetite change, chills, diaphoresis and fever.  HENT: Negative for congestion, mouth sores, nosebleeds, postnasal drip, sneezing, sore throat, trouble swallowing and voice change.   Respiratory: Negative for apnea, cough, choking, chest tightness, shortness of breath and wheezing.   Cardiovascular: Negative for chest pain, palpitations and leg swelling.  Gastrointestinal: Negative for abdominal distention, abdominal pain, constipation, diarrhea and nausea.  Genitourinary: Negative for difficulty urinating, dysuria, frequency and urgency.  Musculoskeletal: Positive for gait problem. Negative for back pain and myalgias. Arthralgias: typical arthritis.  Skin: Negative for color change, pallor, rash and wound.  Neurological: Positive for weakness. Negative for dizziness, tremors, syncope, speech difficulty, numbness and headaches.  Psychiatric/Behavioral: Negative for agitation and behavioral problems.  All other systems reviewed and are negative.   Immunization History  Administered Date(s) Administered  . Influenza-Unspecified 10/22/2016  . Pneumococcal Conjugate-13 11/18/2013   Pertinent  Health Maintenance Due  Topic Date Due  . PNA vac Low Risk Adult (2 of 2 - PPSV23) 11/19/2014  . INFLUENZA VACCINE  08/28/2017  . DEXA SCAN  Completed   No flowsheet data found. Functional Status Survey:    Vitals:   07/09/17 1118  BP: 121/69  Pulse: 69  Resp: 14  Temp: 98.2 F (36.8 C)  TempSrc: Oral  SpO2: 100%  Weight: 120 lb 9.6 oz (54.7 kg)  Height: 5' (1.524 m)   Body mass index is 23.55 kg/m. Physical Exam  Constitutional: She is oriented to person, place, and time. Vital signs are normal. She appears well-developed and well-nourished. She is active and cooperative. She does not appear ill. No distress.  HENT:  Head: Normocephalic and atraumatic.   Mouth/Throat: Uvula is midline, oropharynx is clear and moist and mucous membranes are normal. Mucous membranes are not pale, not dry and not cyanotic.  Eyes: Pupils are equal, round, and reactive to light. Conjunctivae, EOM and lids are normal.  Neck: Trachea normal, normal range of motion and full passive range of motion without pain. Neck supple. No JVD present. No tracheal deviation, no edema and no erythema present. No thyromegaly present.  Cardiovascular: Normal rate, normal heart sounds, intact distal pulses and normal pulses. An irregular rhythm present. Exam reveals no gallop, no distant heart sounds and no friction rub.  No murmur heard. Pulses:      Dorsalis pedis pulses are 2+ on the right side, and 2+ on the left side.  No edema  Pulmonary/Chest: Effort normal and breath sounds normal. No accessory muscle usage. No respiratory distress. She has no decreased breath sounds. She has no wheezes. She has no rhonchi. She has no rales. She exhibits no tenderness.  Abdominal: Soft. Normal appearance and bowel sounds are normal. She exhibits no distension and no ascites. There is no tenderness.  Musculoskeletal: Normal range of motion. She exhibits no edema or tenderness.  Expected osteoarthritis, stiffness; Bilateral Calves soft, supple. Negative Homan's Sign. B- pedal pulses equal; walker for ambulation  Neurological: She is alert and oriented to person, place, and time. She has normal strength.  Skin: Skin is warm, dry and intact. She is not diaphoretic. No cyanosis. No pallor. Nails show no clubbing.  Psychiatric: She has a normal mood and affect. Her speech is normal and behavior is normal. Judgment and thought content normal. Cognition and memory are normal.  Nursing note and vitals reviewed.   Labs reviewed: Recent Labs    03/31/17 1418 06/26/17 1420 07/03/17 1400  NA 138 137 139  K 4.0 3.9 3.8  CL 101 99* 104  CO2 '28 26 25  '$ GLUCOSE 88 147* 122*  BUN '14 15 15  '$ CREATININE  0.75 0.95 0.81  CALCIUM 9.4 9.2 9.6   Recent Labs    12/24/16 1144 03/31/17 1418 06/26/17 1420  AST '17 18 19  '$ ALT 5* <5* <5*  ALKPHOS 73 68 65  BILITOT 0.7 0.4 0.5  PROT 6.9 7.6 7.2  ALBUMIN 3.6 3.8 3.6   Recent Labs    12/24/16 1144 03/31/17 1418 06/26/17 1420  WBC 7.3 7.9 8.1  NEUTROABS  4.2 4.4 4.5  HGB 13.1 13.6 13.0  HCT 39.5 40.7 37.9  MCV 89.8 95.6 94.9  PLT 298 345 298   Lab Results  Component Value Date   TSH 2.420 09/30/2016   No results found for: HGBA1C No results found for: CHOL, HDL, LDLCALC, LDLDIRECT, TRIG, CHOLHDL  Significant Diagnostic Results in last 30 days:  No results found.  Assessment/Plan Laura Cisneros was seen today for medical management of chronic issues.  Diagnoses and all orders for this visit:  Overactive bladder  Deficiency of other specified B group vitamins  Dry eyes   Above listed conditions stable  Continue current medication regimen  Assist with ADLs, as needed  Monitor for discomfort or eye irritation  Skin care per protocol  Safety precautions  Fall precautions  Labs next month  Family/ staff Communication:   Total Time:  Documentation:  Face to Face:  Family/Phone:   Labs/tests ordered:  Next month- cbc, met c, Mag+, TSH, B12, D,   Medication list reviewed and assessed for continued appropriateness. Monthly medication orders reviewed and signed.  Vikki Ports, NP-C [facility] [phone], [place] [address] Cell Phone (Mon-Fri 8am-5pm):  [phone] On Call:  [phone] & follow prompts after 5pm & weekends Office Phone:  [phone] Office Fax:  [phone]

## 2017-07-28 ENCOUNTER — Encounter
Admission: RE | Admit: 2017-07-28 | Payer: Medicare Other | Source: Ambulatory Visit | Attending: Internal Medicine | Admitting: Internal Medicine

## 2017-08-01 ENCOUNTER — Non-Acute Institutional Stay (SKILLED_NURSING_FACILITY): Payer: Medicare Other | Admitting: Adult Health

## 2017-08-01 ENCOUNTER — Encounter: Payer: Self-pay | Admitting: Adult Health

## 2017-08-01 DIAGNOSIS — I110 Hypertensive heart disease with heart failure: Secondary | ICD-10-CM

## 2017-08-01 DIAGNOSIS — G20 Parkinson's disease: Secondary | ICD-10-CM | POA: Diagnosis not present

## 2017-08-01 DIAGNOSIS — H409 Unspecified glaucoma: Secondary | ICD-10-CM

## 2017-08-01 DIAGNOSIS — I5032 Chronic diastolic (congestive) heart failure: Secondary | ICD-10-CM | POA: Diagnosis not present

## 2017-08-01 DIAGNOSIS — I2699 Other pulmonary embolism without acute cor pulmonale: Secondary | ICD-10-CM

## 2017-08-01 DIAGNOSIS — I825Z1 Chronic embolism and thrombosis of unspecified deep veins of right distal lower extremity: Secondary | ICD-10-CM | POA: Diagnosis not present

## 2017-08-01 DIAGNOSIS — N3281 Overactive bladder: Secondary | ICD-10-CM

## 2017-08-01 NOTE — Progress Notes (Signed)
Location:   [facility] [place] [facility] [place] [facility] Room Number: [phone] Place of Service:  SNF (31)   CODE STATUS: FULL  No Known Allergies  Chief Complaint  Patient presents with  . Medical Management of Chronic Issues    dvt hypertension; chf.     HPI:  She is a 84 year old long term resident of this facility is being seen for the management of her chronic illnesses; dvt hypertension; chf. She denies any cough; shortness of breat hor leg swelling. She does have chronic back pain for which tylenol is effective. There are no nursing concerns at this time.   Past Medical History:  Diagnosis Date  . A-fib ([place])    unspecified  . Anemia    unspecified  . Arthritis    osteoarthritis  . Atrophic vaginitis   . Bleeding hemorrhoid   . Breast cancer ([place])    unspecified  . Cancer ([place])    Breast  . Cataract   . Diverticulosis   . DVT (deep vein thrombosis) in pregnancy ([place])   . Hiatal hernia   . Hypertension   . Incontinence   . Osteoarthritis   . Osteoporosis    a. Intolerance to Fosamax b. Bilateral foot fractures c. IV Boniva d. Low vitamin D e. Reclast  . Parkinson disease ([place])   . PE (pulmonary thromboembolism) ([place])   . Scleritis    Idiopathic a. Prednisone b. s/p methotrexate  . TIA (transient ischemic attack) 1997   Possible  . Urinary urgency   . Vitamin B12 deficiency     Past Surgical History:  Procedure Laterality Date  . ABDOMINAL HYSTERECTOMY    . ABDOMINAL HYSTERECTOMY     partial  . BLADDER TACK SURGERY     Dr. Ouida Sills  . BREAST BIOPSY Left 10/2012   benign  . BREAST BIOPSY Left 11/22/2015   invasive mammary ca   . BREAST EXCISIONAL BIOPSY Left 12/08/2015   lumpectomy  . BREAST LUMPECTOMY Left 11/2015   Invasive mammary carcinoma Grade I  . EYE SURGERY Bilateral    Catarct Extraction with IOL  . FINGER SURGERY Right   . FOOT FRACTURE SURGERY Right 06/2003   pinning, [facility] Franc[facility], [facility]  . FOOT SURGERY Left   . INCONTINENCE SURGERY      . NEUROMA SURGERY    . PACEMAKER INSERTION Right 10/02/2016   Procedure: INSERTION PACEMAKER;  Surgeon: Isaias Cowman, MD;  Location: [facility] ORS;  Service: Cardiovascular;  Laterality: Right;  . PARTIAL MASTECTOMY WITH AXILLARY SENTINEL LYMPH NODE BIOPSY Left 12/08/2015   Procedure: PARTIAL MASTECTOMY WITH AXILLARY SENTINEL LYMPH NODE BIOPSY;  Surgeon: [name], MD;  Location: [facility] ORS;  Service: General;  Laterality: Left;    Social History   Socioeconomic History  . Marital status: Widowed    Spouse name: Not on file  . Number of children: 2  . Years of education: Not on file  . Highest education level: Not on file  Occupational History  . Not on file  Social Needs  . Financial resource strain: Not on file  . Food insecurity:    Worry: Not on file    Inability: Not on file  . Transportation needs:    Medical: Not on file    Non-medical: Not on file  Tobacco Use  . Smoking status: Never Smoker  . Smokeless tobacco: Never Used  Substance and Sexual Activity  . Alcohol use: No  . Drug use: No  . Sexual activity: Never  Lifestyle  .  Physical activity:    Days per week: Not on file    Minutes per session: Not on file  . Stress: Not on file  Relationships  . Social connections:    Talks on phone: Not on file    Gets together: Not on file    Attends religious service: Not on file    Active member of club or organization: Not on file    Attends meetings of clubs or organizations: Not on file    Relationship status: Not on file  . Intimate partner violence:    Fear of current or ex partner: Not on file    Emotionally abused: Not on file    Physically abused: Not on file    Forced sexual activity: Not on file  Other Topics Concern  . Not on file  Social History Narrative   Widowed   2 children   Never smoker   Denies alcohol use   Full Code   Family History  Problem Relation Age of Onset  . Esophageal cancer Sister   . Kidney disease Neg Hx   .  Bladder Cancer Neg Hx   . Breast cancer Neg Hx       VITAL SIGNS BP 116/78   Pulse 91   Temp 98.4 F (36.9 C) (Oral)   Resp 12   Ht 5' (1.524 m)   Wt 119 lb 14.4 oz (54.4 kg)   SpO2 97%   BMI 23.42 kg/m   Outpatient Encounter Medications as of 08/01/2017  Medication Sig  . acetaminophen (TYLENOL) 325 MG tablet 650 mg three times daily as needed   . alum & mag hydroxide-simeth (MAALOX PLUS) 400-400-40 MG/5ML suspension Take 30 mLs every 4 (four) hours as needed by mouth for indigestion.  . calcium carbonate (OSCAL) 1500 (600 Ca) MG TABS tablet Take 2 tablets by mouth daily.  . carbidopa-levodopa (SINEMET CR) 50-200 MG tablet Take 3 tablets by mouth 3 (three) times daily.  . cholecalciferol (VITAMIN D) 400 units TABS tablet Take 800 Units by mouth daily. Per cancer center office visit note on 11/27  . conjugated estrogens (PREMARIN) vaginal cream Apply 0.5 mg ( pea-sized amount) just inside the vaginal introitus with finger-tip weekly on Monday nights  . cyanocobalamin (,VITAMIN B-12,) 1000 MCG/ML injection Inject 1,000 mcg into the skin every 30 (thirty) days. On 1st of the month  . diltiazem (CARDIZEM) 120 MG tablet Take 120 mg by mouth daily.  . furosemide (LASIX) 40 MG tablet Take 40 mg by mouth daily.   . Hyprom-Naphaz-Polysorb-Zn Sulf (CLEAR EYES COMPLETE) SOLN Place 2 drops every 2 (two) hours as needed into both eyes. For dry, irritated eyes. Ok to leave at bedside.  . [place] (DERMACLOUD) CREA Apply liberal amount to area of skin irritation topically as needed. Ok to leave at bedside.  . latanoprost (XALATAN) 0.005 % ophthalmic solution Place 1 drop into both eyes at bedtime. Per Dr. Wallace Going  . Lidocaine HCl (ASPERCREME W/LIDOCAINE) 4 % CREA Apply liberal amount topically  to shoulders/ neck or any other area of pain TID prn for pain/OA  . Melatonin 3 MG TABS Take 3 mg by mouth at bedtime.  . mirabegron ER (MYRBETRIQ) 25 MG TB24 tablet Take 25 mg by mouth daily.   . ondansetron (ZOFRAN) 4 MG tablet Take 4 mg by mouth every 6 (six) hours as needed for nausea or vomiting.  . potassium chloride SA (K-DUR,KLOR-CON) 20 MEQ tablet Take 20 mEq by mouth daily.   Marland Kitchen  rOPINIRole (REQUIP) 0.5 MG tablet Take 1 tablet (0.5 mg total) by mouth at bedtime.  . sennosides-docusate sodium (SENOKOT-S) 8.6-50 MG tablet Take 1 tablet by mouth 2 (two) times daily as needed for constipation.  Alveda Reasons 20 MG TABS tablet Take 20 mg by mouth daily.    No facility-administered encounter medications on file as of 08/01/2017.      SIGNIFICANT DIAGNOSTIC EXAMS  LABS REVIEWED:   07-03-17: glucose 122; bun 15; creat 0.81; k+ 3.8; na++ 139; ca 9.6   Review of Systems  Constitutional: Negative for malaise/fatigue.  Respiratory: Negative for cough and shortness of breath.   Cardiovascular: Negative for chest pain, palpitations and leg swelling.  Gastrointestinal: Negative for abdominal pain, constipation and heartburn.  Musculoskeletal: Positive for back pain. Negative for joint pain and myalgias.       Has chronic back pain   Skin: Negative.   Neurological: Negative for dizziness.  Psychiatric/Behavioral: The patient is not nervous/anxious.     Physical Exam  Constitutional: She is oriented to person, place, and time. She appears well-developed and well-nourished. No distress.  Frail   Neck: No thyromegaly present.  Cardiovascular: Normal rate, normal heart sounds and intact distal pulses.  Heart rate irregular   Pulmonary/Chest: Effort normal and breath sounds normal. No respiratory distress.  Abdominal: Soft. Bowel sounds are normal. She exhibits no distension. There is no tenderness.  Musculoskeletal: Normal range of motion. She exhibits no edema.  Uses walker Kyphosis   Lymphadenopathy:    She has no cervical adenopathy.  Neurological: She is alert and oriented to person, place, and time.  Skin: Skin is warm and dry. She is not diaphoretic.  Psychiatric: She has a  normal mood and affect.     ASSESSMENT/ PLAN:  TODAY:   1. Paroxysmal atrial fibrillation/has sick sinus snydomre: heart rate stable; will continue cardizem 120 mg daily for rate control is taking xarelto 20 mg daily   2. DVT of distal vein of right lower extremity; PE: is stable is on long xarelto 20 mg daily   3. Chronic diastolic heart failure: is stable will continue lasix 40 mg daily with k+ 20 meq daily   4. Hypertensive heart disease with heart failure: is stable b/p 116/78: will continue cardizem 120 mg daily   5. Parkinson disease: is stable will continue sinemet 50/200 mg three times daily and requip 0.5 mg nightly   6. Overactive bladder: has post-menopausal atrophic vaginitis: is stable will continue myrbetric 25 mg daily and premarin vaginal cream weekly   7,  bilateral glaucoma: is stable will continue xalatan to both eyes nightly   8. Chronic constipation: is stable will continue senna s twice daily and twice daily as needed   MD is aware of resident's narcotic use and is in agreement with current plan of care. We will attempt to wean resident as apropriate   Ok Edwards NP [facility]  Contact [phone] Monday through Friday 8am- 5pm  After hours call [phone]

## 2017-08-04 ENCOUNTER — Other Ambulatory Visit
Admission: RE | Admit: 2017-08-04 | Discharge: 2017-08-04 | Disposition: A | Discharge status: Home / Self Care | Payer: Medicare Other | Source: Ambulatory Visit | Attending: Gerontology | Admitting: Gerontology

## 2017-08-04 ENCOUNTER — Encounter: Payer: Self-pay | Admitting: Adult Health

## 2017-08-04 DIAGNOSIS — I110 Hypertensive heart disease with heart failure: Secondary | ICD-10-CM | POA: Insufficient documentation

## 2017-08-04 DIAGNOSIS — H409 Unspecified glaucoma: Secondary | ICD-10-CM | POA: Insufficient documentation

## 2017-08-04 LAB — COMPREHENSIVE METABOLIC PANEL
ALBUMIN: 3.3 g/dL — AB (ref 3.5–5.0)
ALT: <5 U/L (ref 0–44)
ANION GAP: 9 (ref 5–15)
AST: 17 U/L (ref 15–41)
Alkaline Phosphatase: 63 U/L (ref 38–126)
BILIRUBIN TOTAL: 0.6 mg/dL (ref 0.3–1.2)
BUN: 17 mg/dL (ref 8–23)
CHLORIDE: 101 mmol/L (ref 98–111)
CO2: 31 mmol/L (ref 22–32)
Calcium: 9 mg/dL (ref 8.9–10.3)
Creatinine, Ser: 0.63 mg/dL (ref 0.44–1.00)
GFR calc Af Amer: >60 mL/min (ref >60)
GLUCOSE: 83 mg/dL (ref 70–99)
POTASSIUM: 3.7 mmol/L (ref 3.5–5.1)
Sodium: 141 mmol/L (ref 135–145)
TOTAL PROTEIN: 6.8 g/dL (ref 6.5–8.1)

## 2017-08-04 LAB — CBC WITH DIFFERENTIAL/PLATELET
Basophils Absolute: 0.1 10*3/uL (ref 0–0.1)
Basophils Relative: 1 %
EOS PCT: 4 %
Eosinophils Absolute: 0.3 10*3/uL (ref 0–0.7)
HEMATOCRIT: 36.2 % (ref 35.0–47.0)
Hemoglobin: 12.6 g/dL (ref 12.0–16.0)
LYMPHS PCT: 32 %
Lymphs Abs: 2.2 10*3/uL (ref 1.0–3.6)
MCH: 32.2 pg (ref 26.0–34.0)
MCHC: 34.8 g/dL (ref 32.0–36.0)
MCV: 92.5 fL (ref 80.0–100.0)
MONO ABS: 0.7 10*3/uL (ref 0.2–0.9)
Monocytes Relative: 10 %
Neutro Abs: 3.6 10*3/uL (ref 1.4–6.5)
Neutrophils Relative %: 53 %
Platelets: 311 10*3/uL (ref 150–440)
RBC: 3.91 MIL/uL (ref 3.80–5.20)
RDW: 14.2 % (ref 11.5–14.5)
WBC: 6.9 10*3/uL (ref 3.6–11.0)

## 2017-08-04 LAB — MAGNESIUM: MAGNESIUM: 2.3 mg/dL (ref 1.7–2.4)

## 2017-08-04 LAB — VITAMIN B12: VITAMIN B 12: 800 pg/mL (ref 180–914)

## 2017-08-04 LAB — TSH: TSH: 1.983 u[IU]/mL (ref 0.350–4.500)

## 2017-08-05 LAB — VITAMIN D 25 HYDROXY (VIT D DEFICIENCY, FRACTURES): Vit D, 25-Hydroxy: 51.9 ng/mL (ref 30.0–100.0)

## 2017-08-28 ENCOUNTER — Encounter
Admission: RE | Admit: 2017-08-28 | Discharge: 2017-08-28 | Disposition: A | Discharge status: Home / Self Care | Payer: Medicare Other | Source: Ambulatory Visit | Attending: Internal Medicine | Admitting: Internal Medicine

## 2017-09-01 ENCOUNTER — Encounter: Payer: Self-pay | Admitting: Adult Health

## 2017-09-01 ENCOUNTER — Non-Acute Institutional Stay (SKILLED_NURSING_FACILITY): Payer: Medicare Other | Admitting: Adult Health

## 2017-09-01 DIAGNOSIS — I5032 Chronic diastolic (congestive) heart failure: Secondary | ICD-10-CM | POA: Diagnosis not present

## 2017-09-01 DIAGNOSIS — H409 Unspecified glaucoma: Secondary | ICD-10-CM

## 2017-09-01 DIAGNOSIS — I825Z1 Chronic embolism and thrombosis of unspecified deep veins of right distal lower extremity: Secondary | ICD-10-CM | POA: Diagnosis not present

## 2017-09-01 DIAGNOSIS — I110 Hypertensive heart disease with heart failure: Secondary | ICD-10-CM | POA: Diagnosis not present

## 2017-09-01 DIAGNOSIS — I2699 Other pulmonary embolism without acute cor pulmonale: Secondary | ICD-10-CM

## 2017-09-01 DIAGNOSIS — G20 Parkinson's disease: Secondary | ICD-10-CM

## 2017-09-01 DIAGNOSIS — I480 Paroxysmal atrial fibrillation: Secondary | ICD-10-CM

## 2017-09-01 DIAGNOSIS — N3281 Overactive bladder: Secondary | ICD-10-CM

## 2017-09-01 NOTE — Progress Notes (Signed)
Provider:Deboarh Nyoka Cowden, NP Location  [facility]  PCP: Kirk Ruths, MD  Extended Emergency Contact Information Primary Emergency Contact: Carlisi,Timothy L Address: [address]          [place], [place] [address] [name] of [place] Phone: [phone] Relation: Son Secondary Emergency Contact: Iwai,James Address: [address]          [place], [place] [address] [name] of [place] Phone: [phone] Work Phone: [phone] Mobile Phone: [phone] Relation: Son  Codes status: FULL Goals of care: advanced directive information Advanced Directives 09/01/2017  Does Patient Have a Medical Advance Directive? No  Type of Advance Directive -  Does patient want to make changes to medical advance directive? -  Copy of [place] in Chart? -  Would patient like information on creating a medical advance directive? -     No Known Allergies  Chief Complaint  Patient presents with  . Annual Exam    Annual Exam    HPI  She is a 84 year old long term resident of this facility being seen for her annual review. She has not required hospitalization since her admission to this facility. She has not had any acute illnesses. She denies any heart palpations; chest pain or shortness of breath. She will continue to be followed for her chronic illnesses including: afib; diastolic heart failure; dvtthere are no nursing concerns at this time.    Past Medical History:  Diagnosis Date  . A-fib ([place])    unspecified  . Anemia    unspecified  . Arthritis    osteoarthritis  . Bleeding hemorrhoid   . Breast cancer ([place])    unspecified  . Cancer ([place])    Breast  . Cataract   . Diverticulosis   . DVT (deep vein thrombosis) in pregnancy ([place])   . Hiatal hernia   . Hypertension   . Incontinence   . Osteoarthritis   . Osteoporosis    a. Intolerance to Fosamax b. Bilateral foot fractures c. IV Boniva d. Low vitamin D e. Reclast    . Parkinson disease ([place])   . PE (pulmonary thromboembolism) ([place])   . Post-menopausal atrophic vaginitis 01/28/2017  . Scleritis    Idiopathic a. Prednisone b. s/p methotrexate  . TIA (transient ischemic attack) 1997   Possible  . Urinary urgency   . Vitamin B12 deficiency    Past Surgical History:  Procedure Laterality Date  . ABDOMINAL HYSTERECTOMY    . ABDOMINAL HYSTERECTOMY     partial  . BLADDER TACK SURGERY     Dr. Ouida Sills  . BREAST BIOPSY Left 10/2012   benign  . BREAST BIOPSY Left 11/22/2015   invasive mammary ca   . BREAST EXCISIONAL BIOPSY Left 12/08/2015   lumpectomy  . BREAST LUMPECTOMY Left 11/2015   Invasive mammary carcinoma Grade I  . EYE SURGERY Bilateral    Catarct Extraction with IOL  . FINGER SURGERY Right   . FOOT FRACTURE SURGERY Right 06/2003   pinning, Dr. Francia Greaves, [facility]  . FOOT SURGERY Left   . INCONTINENCE SURGERY    . NEUROMA SURGERY    . PACEMAKER INSERTION Right 10/02/2016   Procedure: INSERTION PACEMAKER;  Surgeon: Isaias Cowman, MD;  Location: [facility] ORS;  Service: Cardiovascular;  Laterality: Right;  . PARTIAL MASTECTOMY WITH AXILLARY SENTINEL LYMPH NODE BIOPSY Left 12/08/2015   Procedure: PARTIAL MASTECTOMY WITH AXILLARY SENTINEL LYMPH NODE BIOPSY;  Surgeon: Leonie Doralene Glanz, MD;  Location: [facility] ORS;  Service:  General;  Laterality: Left;    reports that she has never smoked. She has never used smokeless tobacco. She reports that she does not drink alcohol or use drugs. Social History   Tobacco Use  . Smoking status: Never Smoker  . Smokeless tobacco: Never Used  Substance Use Topics  . Alcohol use: No  . Drug use: No   Family History  Problem Relation Age of Onset  . Esophageal cancer Sister   . Kidney disease Neg Hx   . Bladder Cancer Neg Hx   . Breast cancer Neg Hx     Pertinent  Health Maintenance Due  Topic Date Due  . PNA vac Low Risk Adult (2 of 2 - PPSV23) 11/19/2014  . INFLUENZA VACCINE  08/28/2017  . DEXA  SCAN  Completed   No flowsheet data found. No flowsheet data found.      Outpatient Encounter Medications as of 09/01/2017  Medication Sig  . acetaminophen (TYLENOL) 325 MG tablet Take 650 mg by mouth 3 (three) times daily as needed.  Marland Kitchen acetaminophen (TYLENOL) 325 MG tablet Take 650 mg by mouth every 6 (six) hours as needed. for pain/ increased temp. May be administered orally, per G-tube if needed or rectally if unable to swallow (separate order). Maximum dose for 24 hours is 3,000 mg from all sources of Acetaminophen/ Tylenol  . alum & mag hydroxide-simeth (MAALOX PLUS) 400-400-40 MG/5ML suspension Take 30 mLs every 4 (four) hours as needed by mouth for indigestion.  . bisacodyl (DULCOLAX) 10 MG suppository Place 10 mg rectally daily as needed.  . calcium carbonate (OSCAL) 1500 (600 Ca) MG TABS tablet Take 2 tablets by mouth daily.  . carbidopa-levodopa (SINEMET CR) 50-200 MG tablet Take 3 tablets by mouth 3 (three) times daily.  . cholecalciferol (VITAMIN D) 400 units TABS tablet Take 800 Units by mouth daily. Per cancer center office visit note on 11/27  . conjugated estrogens (PREMARIN) vaginal cream Apply 0.5 mg ( pea-sized amount) just inside the vaginal introitus with finger-tip weekly on Monday nights  . cyanocobalamin (,VITAMIN B-12,) 1000 MCG/ML injection Inject 1,000 mcg into the skin every 30 (thirty) days. On 1st of the month  . diltiazem (CARDIZEM) 120 MG tablet Take 120 mg by mouth daily.  . furosemide (LASIX) 40 MG tablet Take 40 mg by mouth daily.   . Hyprom-Naphaz-Polysorb-Zn Sulf (CLEAR EYES COMPLETE) SOLN Place 2 drops every 2 (two) hours as needed into both eyes. For dry, irritated eyes. Ok to leave at bedside.  . [place] (DERMACLOUD) CREA Apply liberal amount to area of skin irritation topically as needed. Ok to leave at bedside.  . latanoprost (XALATAN) 0.005 % ophthalmic solution Place 1 drop into both eyes at bedtime. Per Dr. Wallace Going  . Lidocaine HCl  (ASPERCREME W/LIDOCAINE) 4 % CREA Apply liberal amount topically  to shoulders/ neck or any other area of pain TID prn for pain/OA  . Melatonin 3 MG TABS Take 3 mg by mouth at bedtime.  . mirabegron ER (MYRBETRIQ) 25 MG TB24 tablet Take 25 mg by mouth daily.  Marland Kitchen NUTRITIONAL SUPPLEMENTS PO Take by mouth. Diet Type: Regular diet with Chopped Meats  . ondansetron (ZOFRAN) 4 MG tablet Take 4 mg by mouth every 6 (six) hours as needed for nausea or vomiting.  . potassium chloride SA (K-DUR,KLOR-CON) 20 MEQ tablet Take 20 mEq by mouth daily.   Marland Kitchen rOPINIRole (REQUIP) 0.5 MG tablet Take 1 tablet (0.5 mg total) by mouth at bedtime.  . sennosides-docusate  sodium (SENOKOT-S) 8.6-50 MG tablet Take 1 tablet by mouth 2 (two) times daily as needed for constipation.  Alveda Reasons 20 MG TABS tablet Take 20 mg by mouth daily.    No facility-administered encounter medications on file as of 09/01/2017.      Vitals:   09/01/17 1220  BP: 122/70  Pulse: 70  Resp: 12  Temp: 98.5 F (36.9 C)  TempSrc: Oral  SpO2: 98%  Weight: 118 lb 8 oz (53.8 kg)  Height: 5' (1.524 m)   Body mass index is 23.14 kg/m.   SIGNIFICANT DIAGNOSTIC EXAMS  LABS REVIEWED: PREVIOUS   07-03-17: glucose 122; bun 15; creat 0.81; k+ 3.8; na++ 139; ca 9.6  TODAY:   08-04-17: wbc 6.9; hgb 12.6; hct 36.2; mcv 92.5; plt 311; glucose 83; bun 17; creat 0.63; k+ 3.7; na++ 141; ca 9.0 liver normal albumin 3.3 mag 2.3; tsh 1.983 vit B 12: 800; vit D 51.9    Review of Systems  Constitutional: Negative for malaise/fatigue.  Respiratory: Negative for cough and shortness of breath.   Cardiovascular: Negative for chest pain, palpitations and leg swelling.  Gastrointestinal: Negative for abdominal pain, constipation and heartburn.  Musculoskeletal: Positive for back pain. Negative for joint pain and myalgias.       Has chronic back pain   Skin: Negative.   Neurological: Negative for dizziness.  Psychiatric/Behavioral: The patient is not  nervous/anxious.    Physical Exam  Constitutional: She is oriented to person, place, and time. She appears well-developed and well-nourished. No distress.  Frail   Neck: No thyromegaly present.  Cardiovascular: Normal rate, normal heart sounds and intact distal pulses.  Heart rate irregular   Pulmonary/Chest: Effort normal and breath sounds normal. No respiratory distress.  Abdominal: Soft. Bowel sounds are normal. She exhibits no distension. There is no tenderness.  Musculoskeletal: Normal range of motion. She exhibits no edema.  Uses walker Kyphosis    Lymphadenopathy:    She has no cervical adenopathy.  Neurological: She is alert and oriented to person, place, and time.  Skin: Skin is warm and dry. She is not diaphoretic.  Psychiatric: She has a normal mood and affect.     ASSESSMENT/PLAN  TODAY:   1. Paroxysmal atrial fibrillation/has sick sinus snydomre: heart rate stable; will continue cardizem 120 mg daily for rate control is taking xarelto 20 mg daily   2. DVT of distal vein of right lower extremity; PE: is stable is on long xarelto 20 mg daily   3. Chronic diastolic heart failure: is stable will continue lasix 40 mg daily with k+ 20 meq daily   4. Hypertensive heart disease with heart failure: is stable b/p 122/70: will continue cardizem 120 mg daily   5. Parkinson disease: is stable will continue sinemet 50/200 mg ( 3 tabs)three times daily and requip 0.5 mg nightly   6. Overactive bladder: has post-menopausal atrophic vaginitis: is stable will continue myrbetric 25 mg daily and premarin vaginal cream weekly   7,  bilateral glaucoma: is stable will continue xalatan to both eyes nightly   8. Chronic constipation: is stable will continue senna s twice daily and twice daily as needed   Health maintenance needs: pneumovax  Will stop vit D   Ok Edwards NP [facility]  Contact [phone] Monday through Friday 8am- 5pm  After hours call [phone]

## 2017-09-28 ENCOUNTER — Encounter
Admission: RE | Admit: 2017-09-28 | Discharge: 2017-09-28 | Disposition: A | Discharge status: Home / Self Care | Payer: Medicare Other | Source: Ambulatory Visit | Attending: Internal Medicine | Admitting: Internal Medicine

## 2017-09-30 ENCOUNTER — Encounter: Payer: Self-pay | Admitting: Adult Health

## 2017-09-30 ENCOUNTER — Other Ambulatory Visit
Admission: RE | Admit: 2017-09-30 | Discharge: 2017-09-30 | Disposition: A | Discharge status: Home / Self Care | Payer: Medicare Other | Source: Skilled Nursing Facility | Attending: Internal Medicine | Admitting: Internal Medicine

## 2017-09-30 ENCOUNTER — Non-Acute Institutional Stay (SKILLED_NURSING_FACILITY): Payer: Medicare Other | Admitting: Adult Health

## 2017-09-30 DIAGNOSIS — Z86718 Personal history of other venous thrombosis and embolism: Secondary | ICD-10-CM | POA: Diagnosis not present

## 2017-09-30 DIAGNOSIS — E538 Deficiency of other specified B group vitamins: Secondary | ICD-10-CM

## 2017-09-30 DIAGNOSIS — R300 Dysuria: Secondary | ICD-10-CM | POA: Insufficient documentation

## 2017-09-30 DIAGNOSIS — I480 Paroxysmal atrial fibrillation: Secondary | ICD-10-CM

## 2017-09-30 DIAGNOSIS — G4700 Insomnia, unspecified: Secondary | ICD-10-CM | POA: Diagnosis not present

## 2017-09-30 DIAGNOSIS — G20 Parkinson's disease: Secondary | ICD-10-CM | POA: Diagnosis not present

## 2017-09-30 DIAGNOSIS — I5032 Chronic diastolic (congestive) heart failure: Secondary | ICD-10-CM

## 2017-09-30 DIAGNOSIS — M545 Low back pain: Secondary | ICD-10-CM | POA: Diagnosis present

## 2017-09-30 DIAGNOSIS — N3281 Overactive bladder: Secondary | ICD-10-CM

## 2017-09-30 NOTE — Progress Notes (Signed)
[facility] Room Number: [place] of Service:  SNF ([phone]) Provider:  Durenda Age, NP  Patient Care Team: Kirk Ruths, MD as PCP - General (Internal Medicine)  Extended Emergency Contact Information Primary Emergency Contact: Fullilove,Timothy L Address: [address]          [place], [place] [address] [name] of [place] Phone: [phone] Relation: Son Secondary Emergency Contact: Schafer,James Address: [address]          [place], [place] [address] [name] of [place] Phone: [phone] Work Phone: [phone] Mobile Phone: [phone] Relation: Son  Code Status:  FULL  Goals of care: Advanced Directive information Advanced Directives 09/30/2017  Does Patient Have a Medical Advance Directive? No  Type of Advance Directive -  Does patient want to make changes to medical advance directive? -  Copy of [place] in Chart? -  Would patient like information on creating a medical advance directive? -     Chief Complaint  Patient presents with  . Medical Management of Chronic Issues    Routine Visit    HPI:  Pt is a 84 y.o. female seen today for medical management of chronic diseases. She has PMH breast cancer, atrial fibrillation, osteoporosis, DVT, and Parkinson's disease. She was seen in the room today. Her son just came to visit her. No SOB has been reported.   Past Medical History:  Diagnosis Date  . A-fib ([place])    unspecified  . Anemia    unspecified  . Arthritis    osteoarthritis  . Bleeding hemorrhoid   . Breast cancer ([place])    unspecified  . Cancer ([place])    Breast  . Cataract   . Diverticulosis   . DVT (deep vein thrombosis) in pregnancy ([place])   . Hiatal hernia   . Hypertension   . Incontinence   . Osteoarthritis   . Osteoporosis    a. Intolerance to Fosamax b. Bilateral foot fractures c. IV Boniva d. Low vitamin D e. Reclast  . Parkinson disease ([place])   .  PE (pulmonary thromboembolism) ([place])   . Post-menopausal atrophic vaginitis 01/28/2017  . Scleritis    Idiopathic a. Prednisone b. s/p methotrexate  . TIA (transient ischemic attack) 1997   Possible  . Urinary urgency   . Vitamin B12 deficiency    Past Surgical History:  Procedure Laterality Date  . ABDOMINAL HYSTERECTOMY    . ABDOMINAL HYSTERECTOMY     partial  . BLADDER TACK SURGERY     Dr. Ouida Sills  . BREAST BIOPSY Left 10/2012   benign  . BREAST BIOPSY Left 11/22/2015   invasive mammary ca   . BREAST EXCISIONAL BIOPSY Left 12/08/2015   lumpectomy  . BREAST LUMPECTOMY Left 11/2015   Invasive mammary carcinoma Grade I  . EYE SURGERY Bilateral    Catarct Extraction with IOL  . FINGER SURGERY Right   . FOOT FRACTURE SURGERY Right 06/2003   pinning, Dr. Francia Greaves, [facility]  . FOOT SURGERY Left   . INCONTINENCE SURGERY    . NEUROMA SURGERY    . PACEMAKER INSERTION Right 10/02/2016   Procedure: INSERTION PACEMAKER;  Surgeon: Isaias Cowman, MD;  Location: [facility] ORS;  Service: Cardiovascular;  Laterality: Right;  . PARTIAL MASTECTOMY WITH AXILLARY SENTINEL LYMPH NODE BIOPSY Left 12/08/2015   Procedure: PARTIAL MASTECTOMY WITH AXILLARY SENTINEL LYMPH NODE BIOPSY;  Surgeon: [name], MD;  Location: [facility] ORS;  Service: General;  Laterality: Left;  No Known Allergies  Outpatient Encounter Medications as of 09/30/2017  Medication Sig  . acetaminophen (TYLENOL) 325 MG tablet Take 650 mg by mouth 3 (three) times daily as needed.  Marland Kitchen acetaminophen (TYLENOL) 325 MG tablet Take 650 mg by mouth every 6 (six) hours as needed. for pain/ increased temp. May be administered orally, per G-tube if needed or rectally if unable to swallow (separate order). Maximum dose for 24 hours is 3,000 mg from all sources of Acetaminophen/ Tylenol  . alum & mag hydroxide-simeth (MAALOX PLUS) 400-400-40 MG/5ML suspension Take 30 mLs every 4 (four) hours as needed by mouth for indigestion.  .  bisacodyl (DULCOLAX) 10 MG suppository Place 10 mg rectally daily as needed.  . calcium carbonate (OSCAL) 1500 (600 Ca) MG TABS tablet Take 2 tablets by mouth daily.  . carbidopa-levodopa (SINEMET CR) 50-200 MG tablet Take 3 tablets by mouth 3 (three) times daily.  Marland Kitchen conjugated estrogens (PREMARIN) vaginal cream Apply 0.5 mg ( pea-sized amount) just inside the vaginal introitus with finger-tip weekly on Monday nights  . cyanocobalamin (,VITAMIN B-12,) 1000 MCG/ML injection Inject 1,000 mcg into the skin every 30 (thirty) days. On 1st of the month  . diltiazem (CARDIZEM) 120 MG tablet Take 120 mg by mouth daily.  . furosemide (LASIX) 40 MG tablet Take 40 mg by mouth daily.   . Hyprom-Naphaz-Polysorb-Zn Sulf (CLEAR EYES COMPLETE) SOLN Place 2 drops every 2 (two) hours as needed into both eyes. For dry, irritated eyes. Ok to leave at bedside.  . [place] (DERMACLOUD) CREA Apply liberal amount to area of skin irritation topically as needed. Ok to leave at bedside.  . latanoprost (XALATAN) 0.005 % ophthalmic solution Place 1 drop into both eyes at bedtime. Per Dr. Wallace Going  . Lidocaine HCl (ASPERCREME W/LIDOCAINE) 4 % CREA Apply liberal amount topically  to shoulders/ neck or any other area of pain TID prn for pain/OA  . Melatonin 3 MG TABS Take 3 mg by mouth at bedtime.  . mirabegron ER (MYRBETRIQ) 25 MG TB24 tablet Take 25 mg by mouth daily.  . NON FORMULARY Diet Type: Regular diet with Chopped Meats  . ondansetron (ZOFRAN) 4 MG tablet Take 4 mg by mouth every 6 (six) hours as needed for nausea or vomiting.  . potassium chloride SA (K-DUR,KLOR-CON) 20 MEQ tablet Take 20 mEq by mouth daily.   . rivaroxaban (XARELTO) 20 MG TABS tablet Take 20 mg by mouth daily with supper.  Marland Kitchen rOPINIRole (REQUIP) 0.5 MG tablet Take 1 tablet (0.5 mg total) by mouth at bedtime.  . sennosides-docusate sodium (SENOKOT-S) 8.6-50 MG tablet Take 1 tablet by mouth 2 (two) times daily as needed for constipation.  .  [DISCONTINUED] cholecalciferol (VITAMIN D) 400 units TABS tablet Take 800 Units by mouth daily. Per cancer center office visit note on 11/27  . [DISCONTINUED] NUTRITIONAL SUPPLEMENTS PO Take by mouth. Diet Type: Regular diet with Chopped Meats  . [DISCONTINUED] XARELTO 20 MG TABS tablet Take 20 mg by mouth daily.    No facility-administered encounter medications on file as of 09/30/2017.     Review of Systems  GENERAL: No change in appetite, no fatigue, no weight changes, no fever, chills or weakness MOUTH and THROAT: Denies oral discomfort, gingival pain or bleeding   RESPIRATORY: no cough, SOB, DOE, wheezing, hemoptysis CARDIAC: No chest pain, edema or palpitations GI: No abdominal pain, diarrhea, constipation, heart burn, nausea or vomiting GU: Denies dysuria, frequency, hematuria, incontinence, or discharge PSYCHIATRIC: Denies feelings of depression or  anxiety. No report of hallucinations, insomnia, paranoia, or agitation   Immunization History  Administered Date(s) Administered  . Influenza-Unspecified 10/22/2016  . Pneumococcal Conjugate-13 11/18/2013  . Pneumococcal-Unspecified 10/24/2009  . Zoster 10/15/2011   Pertinent  Health Maintenance Due  Topic Date Due  . INFLUENZA VACCINE  08/28/2017  . DEXA SCAN  Completed  . PNA vac Low Risk Adult  Completed     Physical Exam  GENERAL APPEARANCE: Well nourished. In no acute distress. Normal body habitus SKIN:  Skin is warm and dry.  MOUTH and THROAT: Lips are without lesions. Oral mucosa is moist and without lesions. Tongue is normal in shape, size, and color and without lesions RESPIRATORY: Breathing is even & unlabored, BS CTAB CARDIAC: RRR, no murmur,no extra heart sounds, no edema, has right chest pacemaker  GI: Abdomen soft, normal BS, no masses, no tenderness EXTREMITIES:  Able to move X 4 extremities NEUROLOGICAL: There is no tremor. Speech is clear PSYCHIATRIC: Alert and oriented X 3. Affect and behavior are  appropriate   Labs reviewed: Recent Labs    06/26/17 1420 07/03/17 1400 08/04/17 0540  NA 137 139 141  K 3.9 3.8 3.7  CL 99* 104 101  CO2 26 25 31   GLUCOSE 147* 122* 83  BUN 15 15 17   CREATININE 0.95 0.81 0.63  CALCIUM 9.2 9.6 9.0  MG  --   --  2.3   Recent Labs    03/31/17 1418 06/26/17 1420 08/04/17 0540  AST 18 19 17   ALT <5* <5* <5  ALKPHOS 68 65 63  BILITOT 0.4 0.5 0.6  PROT 7.6 7.2 6.8  ALBUMIN 3.8 3.6 3.3*   Recent Labs    03/31/17 1418 06/26/17 1420 08/04/17 0540  WBC 7.9 8.1 6.9  NEUTROABS 4.4 4.5 3.6  HGB 13.6 13.0 12.6  HCT 40.7 37.9 36.2  MCV 95.6 94.9 92.5  PLT 345 298 311   Lab Results  Component Value Date   TSH 1.983 08/04/2017    Assessment/Plan  1. Parkinson disease ([place]) - continue Sinemet 50-200 mg tabs 3 times a day and ropinirole 0.5 mg 1 tab Q HS   2. History of DVT (deep vein thrombosis) - continue Xarelto 20 mg 1 tab daily   3. OAB (overactive bladder) - continue Myrbetriq 4-hour 25 mg 1 tab daily   4. Insomnia, unspecified type - continue melatonin 3 mg 1 tab daily at bedtime   5. Vitamin B12 deficiency - continue vitamin B12 1000 g injection monthly Lab Results  Component Value Date   VITAMINB12 800 08/04/2017    6. Chronic diastolic heart failure - no SOB, continue Lasix 40 mg 1 tab daily and KCl ER 20 meq  Tab daily  7. Atrial fibrillation - ate controlled, continue diltiazem 12   Family/ staff Communication: Discussed plan of care with resident.  Labs/tests ordered:  None  Goals of care:   Long-term care   Durenda Age, NP [facility] [phone] (Monday-Friday 8:00 a.m. - 5:00 p.m.) [phone] (after hours)

## 2017-10-02 LAB — URINE CULTURE

## 2017-10-03 ENCOUNTER — Other Ambulatory Visit
Admission: RE | Admit: 2017-10-03 | Discharge: 2017-10-03 | Disposition: A | Discharge status: Home / Self Care | Payer: Medicare Other | Source: Skilled Nursing Facility | Attending: Internal Medicine | Admitting: Internal Medicine

## 2017-10-03 DIAGNOSIS — R300 Dysuria: Secondary | ICD-10-CM | POA: Diagnosis present

## 2017-10-03 DIAGNOSIS — M545 Low back pain: Secondary | ICD-10-CM | POA: Diagnosis present

## 2017-10-05 LAB — URINE CULTURE: Culture: <10000 — AB

## 2017-10-08 ENCOUNTER — Other Ambulatory Visit: Payer: Self-pay | Admitting: General Surgery

## 2017-10-08 DIAGNOSIS — Z170 Estrogen receptor positive status [ER+]: Principal | ICD-10-CM

## 2017-10-08 DIAGNOSIS — C50412 Malignant neoplasm of upper-outer quadrant of left female breast: Secondary | ICD-10-CM

## 2017-10-28 ENCOUNTER — Encounter: Payer: Self-pay | Admitting: Adult Health

## 2017-10-28 ENCOUNTER — Encounter
Admission: RE | Admit: 2017-10-28 | Discharge: 2017-10-28 | Disposition: A | Discharge status: Home / Self Care | Payer: Medicare Other | Source: Ambulatory Visit | Attending: Internal Medicine | Admitting: Internal Medicine

## 2017-10-28 ENCOUNTER — Non-Acute Institutional Stay (SKILLED_NURSING_FACILITY): Payer: Medicare Other | Admitting: Adult Health

## 2017-10-28 DIAGNOSIS — I5032 Chronic diastolic (congestive) heart failure: Secondary | ICD-10-CM | POA: Diagnosis not present

## 2017-10-28 DIAGNOSIS — I825Z1 Chronic embolism and thrombosis of unspecified deep veins of right distal lower extremity: Secondary | ICD-10-CM | POA: Diagnosis not present

## 2017-10-28 DIAGNOSIS — I480 Paroxysmal atrial fibrillation: Secondary | ICD-10-CM | POA: Diagnosis not present

## 2017-10-28 DIAGNOSIS — I110 Hypertensive heart disease with heart failure: Secondary | ICD-10-CM

## 2017-10-28 NOTE — Progress Notes (Signed)
[facility] Room Number: [place] of Service:  SNF (31)   CODE STATUS: Full Code  No Known Allergies  Chief Complaint  Patient presents with  . Medical Management of Chronic Issues    AFIB; diastolic heart failure; dvt; hypertensive heart disease     HPI:  She is a 84 year old long term resident of this facility being seen for the management of her chronic illnesses: afib; diastolic heart failure; dvt; hypertensive heart disease. She denies any chest pain; no palpitations; no headaches.   Past Medical History:  Diagnosis Date  . A-fib ([place])    unspecified  . Anemia    unspecified  . Arthritis    osteoarthritis  . Bleeding hemorrhoid   . Breast cancer ([place])    unspecified  . Cancer ([place])    Breast  . Cataract   . Diverticulosis   . DVT (deep vein thrombosis) in pregnancy   . Hiatal hernia   . Hypertension   . Incontinence   . Osteoarthritis   . Osteoporosis    a. Intolerance to Fosamax b. Bilateral foot fractures c. IV Boniva d. Low vitamin D e. Reclast  . Parkinson disease ([place])   . PE (pulmonary thromboembolism) ([place])   . Post-menopausal atrophic vaginitis 01/28/2017  . Scleritis    Idiopathic a. Prednisone b. s/p methotrexate  . TIA (transient ischemic attack) 1997   Possible  . Urinary urgency   . Vitamin B12 deficiency     Past Surgical History:  Procedure Laterality Date  . ABDOMINAL HYSTERECTOMY    . ABDOMINAL HYSTERECTOMY     partial  . BLADDER TACK SURGERY     Dr. Ouida Sills  . BREAST BIOPSY Left 10/2012   benign  . BREAST BIOPSY Left 11/22/2015   invasive mammary ca   . BREAST EXCISIONAL BIOPSY Left 12/08/2015   lumpectomy  . BREAST LUMPECTOMY Left 11/2015   Invasive mammary carcinoma Grade I  . EYE SURGERY Bilateral    Catarct Extraction with IOL  . FINGER SURGERY Right   . FOOT FRACTURE SURGERY Right 06/2003   pinning, Dr. Francia Greaves, [facility]  . FOOT SURGERY Left   . INCONTINENCE SURGERY    .  NEUROMA SURGERY    . PACEMAKER INSERTION Right 10/02/2016   Procedure: INSERTION PACEMAKER;  Surgeon: Isaias Cowman, MD;  Location: [facility] ORS;  Service: Cardiovascular;  Laterality: Right;  . PARTIAL MASTECTOMY WITH AXILLARY SENTINEL LYMPH NODE BIOPSY Left 12/08/2015   Procedure: PARTIAL MASTECTOMY WITH AXILLARY SENTINEL LYMPH NODE BIOPSY;  Surgeon: Leonie Sudie Bandel, MD;  Location: [facility] ORS;  Service: General;  Laterality: Left;    Social History   Socioeconomic History  . Marital status: Widowed    Spouse name: Not on file  . Number of children: 2  . Years of education: Not on file  . Highest education level: Not on file  Occupational History  . Not on file  Social Needs  . Financial resource strain: Not on file  . Food insecurity:    Worry: Not on file    Inability: Not on file  . Transportation needs:    Medical: Not on file    Non-medical: Not on file  Tobacco Use  . Smoking status: Never Smoker  . Smokeless tobacco: Never Used  Substance and Sexual Activity  . Alcohol use: No  . Drug use: No  . Sexual activity: Never  Lifestyle  . Physical activity:    Days per week:  Not on file    Minutes per session: Not on file  . Stress: Not on file  Relationships  . Social connections:    Talks on phone: Not on file    Gets together: Not on file    Attends religious service: Not on file    Active member of club or organization: Not on file    Attends meetings of clubs or organizations: Not on file    Relationship status: Not on file  . Intimate partner violence:    Fear of current or ex partner: Not on file    Emotionally abused: Not on file    Physically abused: Not on file    Forced sexual activity: Not on file  Other Topics Concern  . Not on file  Social History Narrative   Widowed   2 children   Never smoker   Denies alcohol use   Full Code   Family History  Problem Relation Age of Onset  . Esophageal cancer Sister   . Kidney disease Neg Hx   .  Bladder Cancer Neg Hx   . Breast cancer Neg Hx       VITAL SIGNS BP 123/64   Pulse 83   Temp 98.2 F (36.8 C)   Resp 12   Ht 5' (1.524 m)   Wt 118 lb 14.4 oz (53.9 kg)   SpO2 97%   BMI 23.22 kg/m   Outpatient Encounter Medications as of 10/28/2017  Medication Sig  . acetaminophen (TYLENOL) 325 MG tablet Take 650 mg by mouth 3 (three) times daily as needed.  . bisacodyl (DULCOLAX) 10 MG suppository Place 10 mg rectally daily as needed.  . calcium carbonate (OSCAL) 1500 (600 Ca) MG TABS tablet Take 2 tablets by mouth daily.  . carbidopa-levodopa (SINEMET CR) 50-200 MG tablet Take 3 tablets by mouth 3 (three) times daily.  Marland Kitchen conjugated estrogens (PREMARIN) vaginal cream Apply 0.5 mg ( pea-sized amount) just inside the vaginal introitus with finger-tip weekly on Monday nights  . cyanocobalamin (,VITAMIN B-12,) 1000 MCG/ML injection Inject 1,000 mcg into the skin every 30 (thirty) days. On 1st of the month  . diltiazem (CARDIZEM) 120 MG tablet Take 120 mg by mouth daily.  . furosemide (LASIX) 40 MG tablet Take 40 mg by mouth daily.   . [place] (DERMACLOUD) CREA Apply liberal amount to area of skin irritation topically as needed. Ok to leave at bedside.  . latanoprost (XALATAN) 0.005 % ophthalmic solution Place 1 drop into both eyes at bedtime. Per Dr. Wallace Going  . Lidocaine HCl (ASPERCREME W/LIDOCAINE) 4 % CREA Apply liberal amount topically  to shoulders/ neck or any other area of pain TID prn for pain/OA  . Melatonin 3 MG TABS Take 3 mg by mouth at bedtime.  . mirabegron ER (MYRBETRIQ) 25 MG TB24 tablet Take 25 mg by mouth daily.  . NON FORMULARY Diet Type: Regular diet with Chopped Meats  . ondansetron (ZOFRAN) 4 MG tablet Take 4 mg by mouth every 6 (six) hours as needed for nausea or vomiting.  . potassium chloride SA (K-DUR,KLOR-CON) 20 MEQ tablet Take 20 mEq by mouth daily.   . rivaroxaban (XARELTO) 20 MG TABS tablet Take 20 mg by mouth daily with supper.  Marland Kitchen  rOPINIRole (REQUIP) 0.5 MG tablet Take 1 tablet (0.5 mg total) by mouth at bedtime.  . sennosides-docusate sodium (SENOKOT-S) 8.6-50 MG tablet Take 1 tablet by mouth 2 (two) times daily as needed for constipation.  . [DISCONTINUED] acetaminophen (TYLENOL)  325 MG tablet Take 650 mg by mouth every 6 (six) hours as needed. for pain/ increased temp. May be administered orally, per G-tube if needed or rectally if unable to swallow (separate order). Maximum dose for 24 hours is 3,000 mg from all sources of Acetaminophen/ Tylenol  . [DISCONTINUED] alum & mag hydroxide-simeth (MAALOX PLUS) 400-400-40 MG/5ML suspension Take 30 mLs every 4 (four) hours as needed by mouth for indigestion.  . [DISCONTINUED] Hyprom-Naphaz-Polysorb-Zn Sulf (CLEAR EYES COMPLETE) SOLN Place 2 drops every 2 (two) hours as needed into both eyes. For dry, irritated eyes. Ok to leave at bedside.   No facility-administered encounter medications on file as of 10/28/2017.      SIGNIFICANT DIAGNOSTIC EXAMS  LABS REVIEWED: PREVIOUS   07-03-17: glucose 122; bun 15; creat 0.81; k+ 3.8; na++ 139; ca 9.6 08-04-17: wbc 6.9; hgb 12.6; hct 36.2; mcv 92.5; plt 311; glucose 83; bun 17; creat 0.63; k+ 3.7; na++ 141; ca 9.0 liver normal albumin 3.3 mag 2.3; tsh 1.983 vit B 12: 800; vit D 51.9   NO NEW LABS.   Marland KitchenReview of Systems  Constitutional: Negative for malaise/fatigue.  Respiratory: Negative for cough and shortness of breath.   Cardiovascular: Negative for chest pain, palpitations and leg swelling.  Gastrointestinal: Negative for abdominal pain, constipation and heartburn.  Musculoskeletal: Negative for back pain, joint pain and myalgias.  Skin: Negative.   Neurological: Negative for dizziness.  Psychiatric/Behavioral: The patient is not nervous/anxious.       Physical Exam  Constitutional: She is oriented to person, place, and time. No distress.  Frail   Neck: No thyromegaly present.  Cardiovascular: Normal rate, normal heart sounds  and intact distal pulses.  Heart rate irregular   Pulmonary/Chest: Effort normal and breath sounds normal. No respiratory distress.  Abdominal: Soft. Bowel sounds are normal. She exhibits no distension. There is no tenderness.  Musculoskeletal: Normal range of motion. She exhibits no edema.  Uses walker Kyphosis     Lymphadenopathy:    She has no cervical adenopathy.  Neurological: She is alert and oriented to person, place, and time.  Skin: Skin is warm and dry. She is not diaphoretic.  Psychiatric: She has a normal mood and affect.    ASSESSMENT/PLAN  TODAY:   1. Paroxysmal atrial fibrillation/has sick sinus snydomre: heart rate stable; will continue cardizem 120 mg daily for rate control is taking xarelto 20 mg daily   2. DVT of distal vein of right lower extremity; PE: is stable is on long xarelto 20 mg daily   3. Chronic diastolic heart failure: is stable will continue lasix 40 mg daily with k+ 20 meq daily   4. Hypertensive heart disease with heart failure: is stable b/p 123/64: will continue cardizem 120 mg daily   PREVIOUS  5. Parkinson disease: is stable will continue sinemet 50/200 mg ( 3 tabs)three times daily and requip 0.5 mg nightly   6. Overactive bladder: has post-menopausal atrophic vaginitis: is stable will continue myrbetric 25 mg daily and premarin vaginal cream weekly   7,  bilateral glaucoma: is stable will continue xalatan to both eyes nightly   8. Chronic constipation: is stable will continue senna s twice daily and twice daily as needed      MD is aware of resident's narcotic use and is in agreement with current plan of care. We will attempt to wean resident as apropriate   Ok Edwards NP [facility]  Contact [phone] Monday through Friday 8am- 5pm  After hours call  [phone]  

## 2017-11-07 IMAGING — RF DG SWALLOWING FUNCTION
7 series · 13 of 24 positions shown · non-contrast
Comparison: Chest x-ray 07/04/2016.

CLINICAL DATA: Difficulty swallowing .

EXAM:
MODIFIED BARIUM SWALLOW
TECHNIQUE: Different consistencies of barium were administered orally to the
patient by the Speech Pathologist. Imaging of the pharynx was
performed in the lateral projection.
FLUOROSCOPY TIME:  Fluoroscopy Time:  1 minutes 6 seconds
Radiation Exposure Index (if provided by the fluoroscopic device):
2.4 mGy
Number of Acquired Spot Images: Cine images obtained

[Series 1: run · 2 of 6 frames shown (1 of 7)]
[frame 1/6]
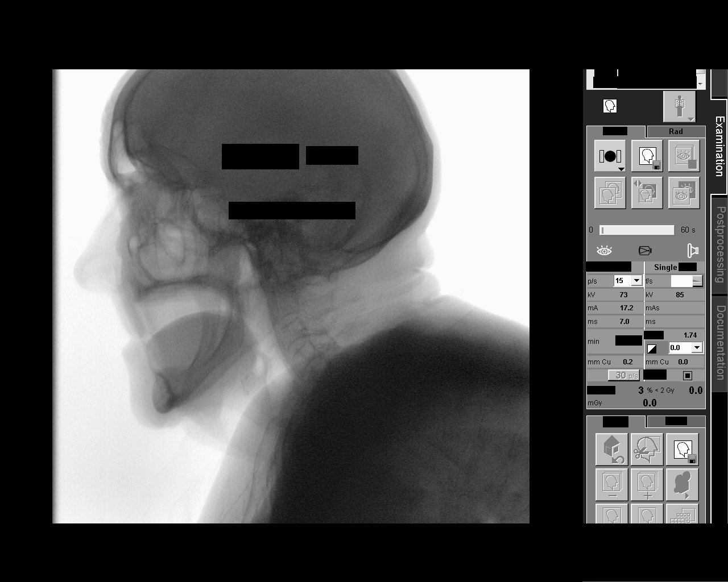
[frame 4/6]
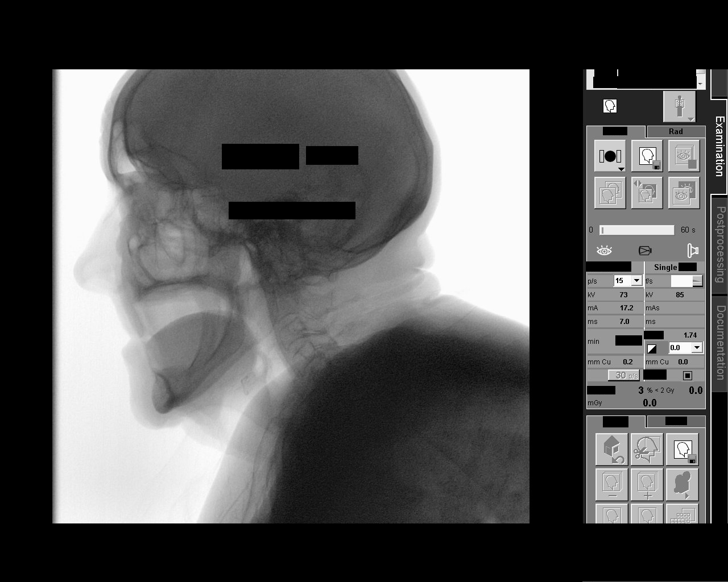

[Series 2: run · 2 of 43 frames shown (2 of 7)]
[frame 22/43]
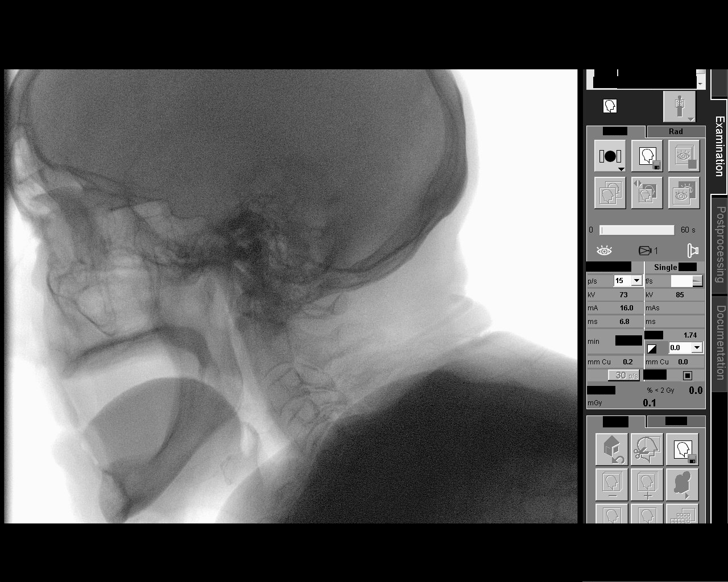
[frame 40/43]
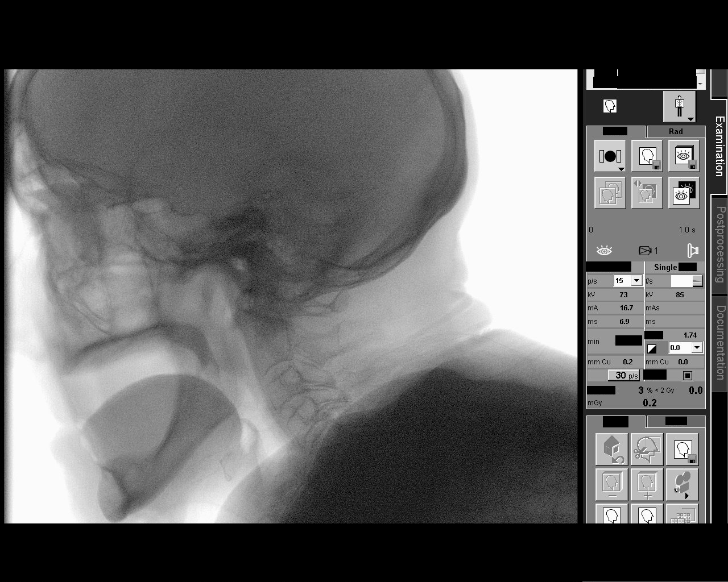

[Series 3: run · 1 of 259 frames shown (3 of 7)]
[frame 115/259]
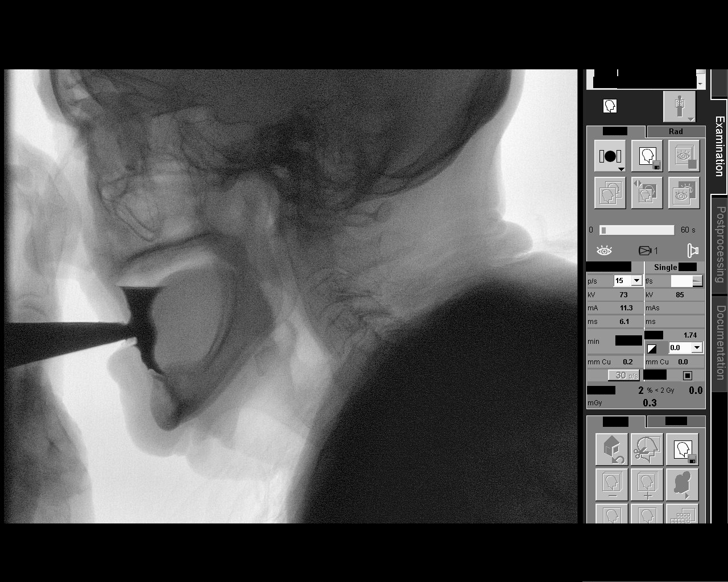

[Series 4: run · 3 of 255 frames shown (4 of 7)]
[frame 39/255]
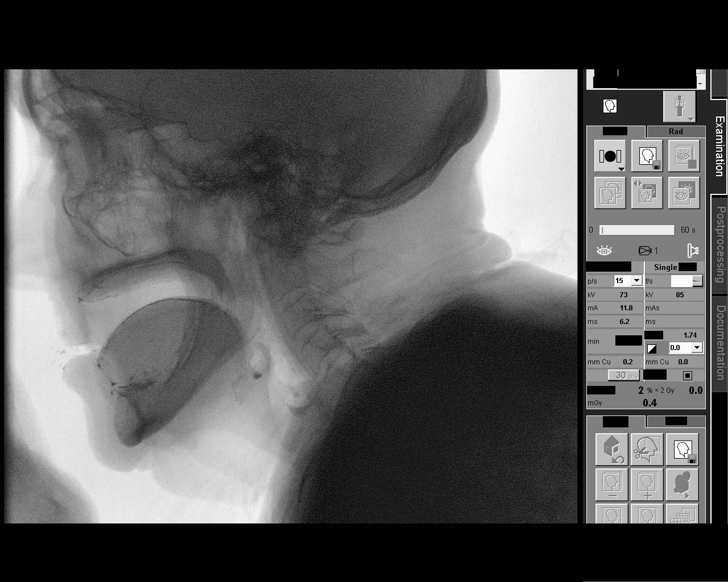
[frame 135/255]
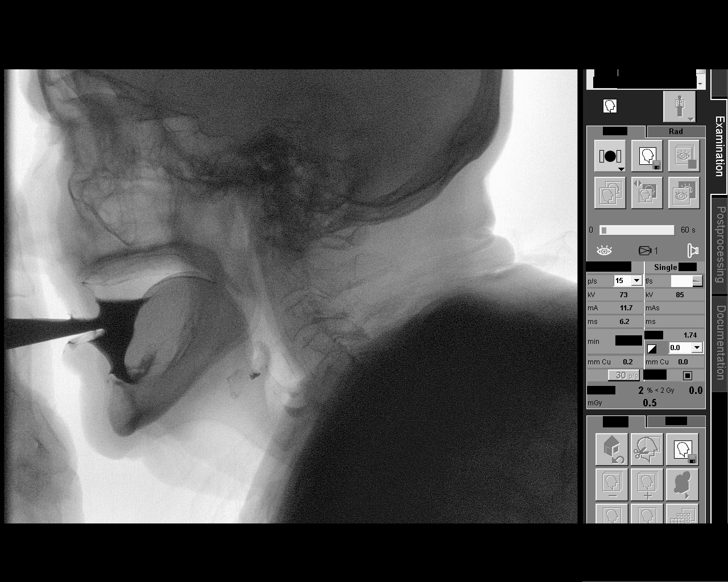
[frame 217/255]
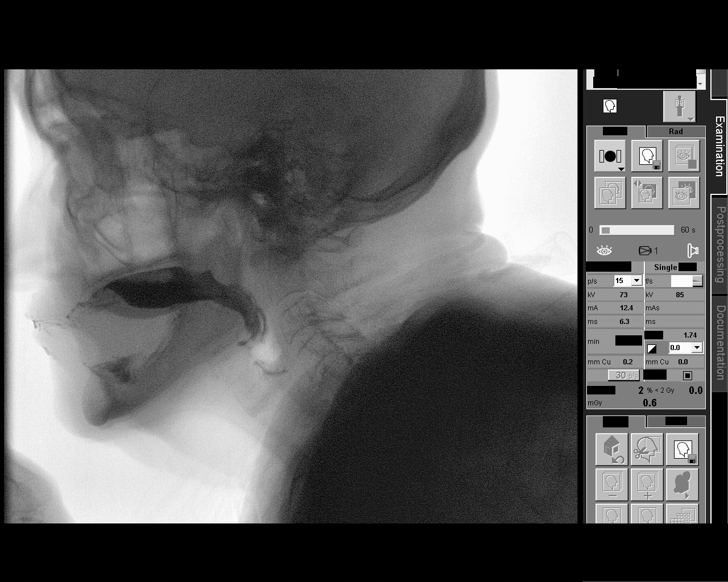

[Series 5: run · 1 of 381 frames shown (5 of 7)]
[frame 191/381]
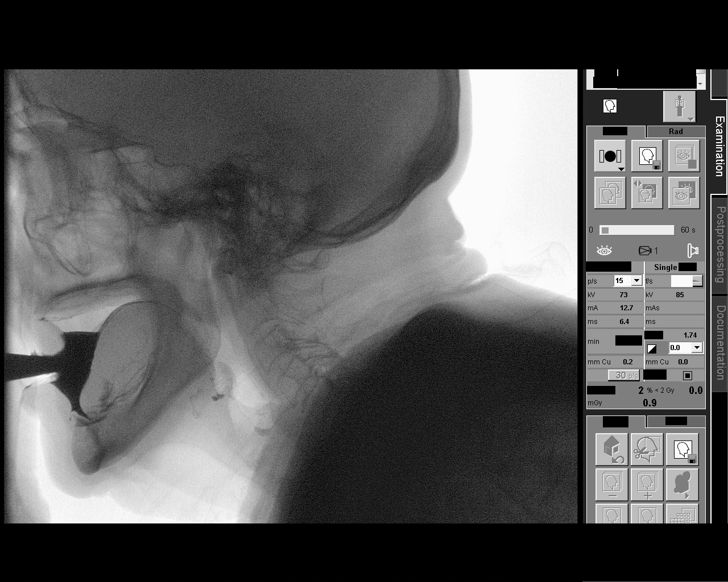

[Series 6: run · 2 of 351 frames shown (6 of 7)]
[frame 53/351]
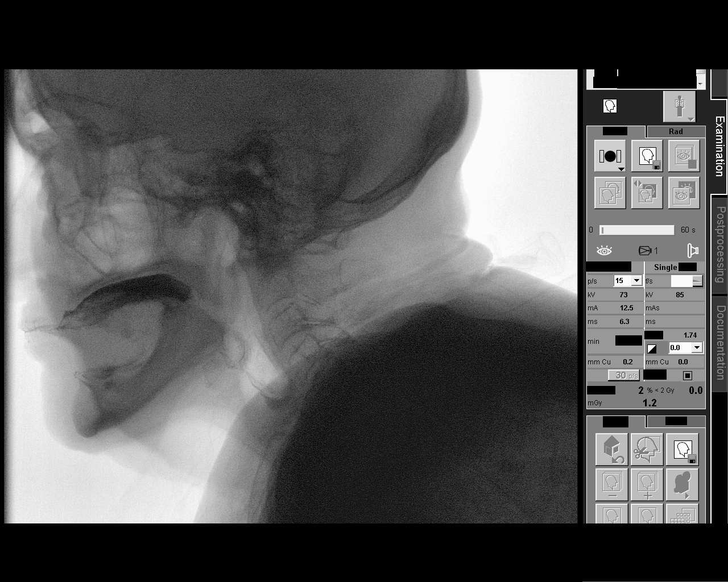
[frame 176/351]
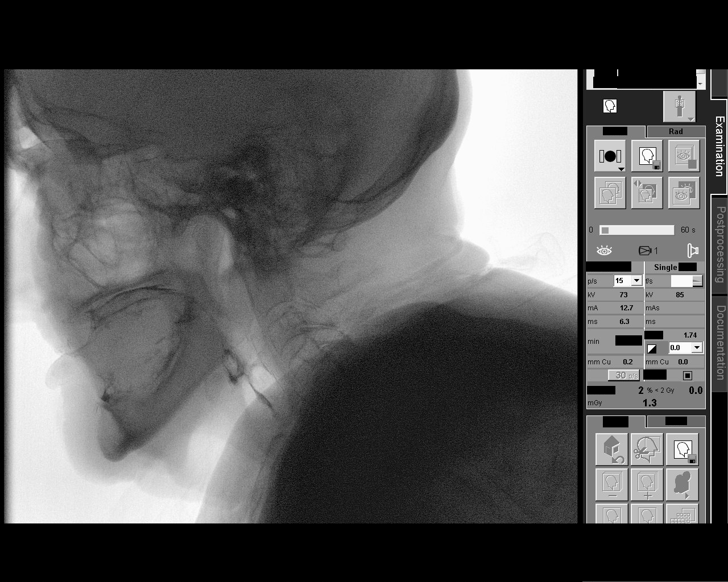

[Series 7: run · 2 of 717 frames shown (7 of 7)]
[frame 359/717]
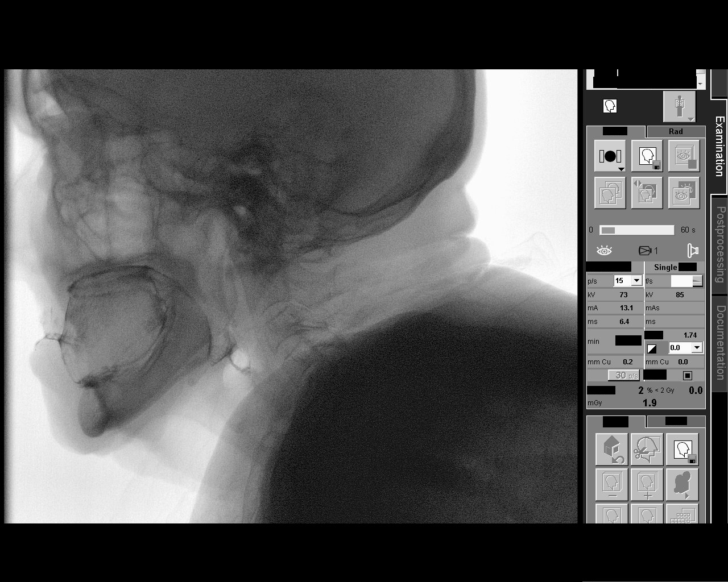
[frame 614/717]
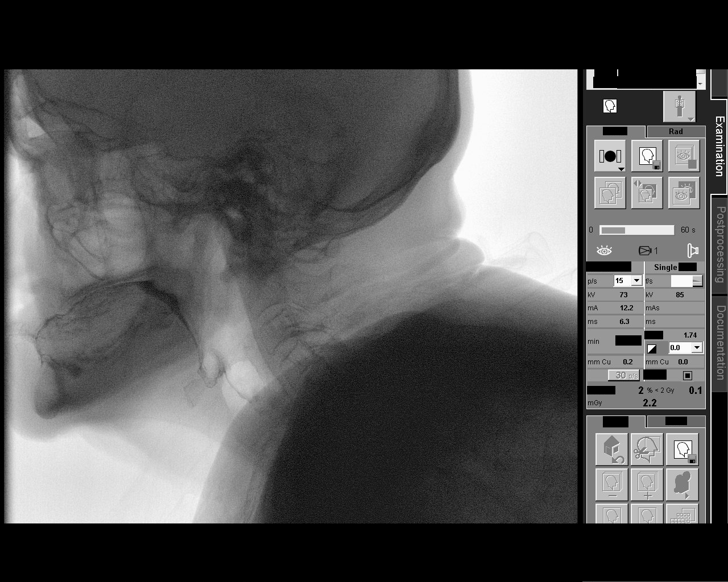

[13 of 24 positions shown; findings below may reference images not displayed]

FINDINGS: Thin liquid- within normal limits

Nectar thick liquid- within normal limits

Poly?Antonny Exel within normal limits
IMPRESSION: Normal exam as above.

Please refer to the Speech Pathologists report for complete details
and recommendations.

## 2017-11-10 ENCOUNTER — Ambulatory Visit
Admission: RE | Admit: 2017-11-10 | Discharge: 2017-11-10 | Disposition: A | Discharge status: Home / Self Care | Payer: Medicare Other | Source: Ambulatory Visit | Attending: General Surgery | Admitting: General Surgery

## 2017-11-10 ENCOUNTER — Ambulatory Visit
Admission: RE | Admit: 2017-11-10 | Discharge: 2017-11-10 | Disposition: A | Discharge status: Home / Self Care | Payer: Medicare Other | Source: Ambulatory Visit | Attending: Urgent Care | Admitting: Urgent Care

## 2017-11-10 DIAGNOSIS — C50412 Malignant neoplasm of upper-outer quadrant of left female breast: Secondary | ICD-10-CM | POA: Insufficient documentation

## 2017-11-10 DIAGNOSIS — Z170 Estrogen receptor positive status [ER+]: Principal | ICD-10-CM

## 2017-11-28 ENCOUNTER — Encounter
Admission: RE | Admit: 2017-11-28 | Discharge: 2017-11-28 | Disposition: A | Discharge status: Home / Self Care | Payer: Medicaid Other | Source: Ambulatory Visit | Attending: Internal Medicine | Admitting: Internal Medicine

## 2017-12-02 ENCOUNTER — Non-Acute Institutional Stay (SKILLED_NURSING_FACILITY): Payer: Medicare Other | Admitting: Adult Health

## 2017-12-02 ENCOUNTER — Encounter: Payer: Self-pay | Admitting: Adult Health

## 2017-12-02 DIAGNOSIS — N3281 Overactive bladder: Secondary | ICD-10-CM

## 2017-12-02 DIAGNOSIS — K5909 Other constipation: Secondary | ICD-10-CM | POA: Diagnosis not present

## 2017-12-02 DIAGNOSIS — G20 Parkinson's disease: Secondary | ICD-10-CM

## 2017-12-02 DIAGNOSIS — H409 Unspecified glaucoma: Secondary | ICD-10-CM | POA: Diagnosis not present

## 2017-12-02 DIAGNOSIS — G20A1 Parkinson's disease without dyskinesia, without mention of fluctuations: Secondary | ICD-10-CM

## 2017-12-02 NOTE — Progress Notes (Signed)
[facility]   [facility] Room Number: [place] of Service:  SNF (31)   CODE STATUS: Full Code  No Known Allergies  Chief Complaint  Patient presents with  . Medical Management of Chronic Issues    Parkinson disease; overactive bladder; glaucoma of both eyes unspecified glaucoma type chronic constipation.     HPI:  She is a long term resident of this facility being seen for the management of her chronic illnesses; parkinson disease; overactive bladder; glaucoma constipation. She denies any problems with constipation; no uncontrolled pain; no anxiety or depressive thoughts.   Past Medical History:  Diagnosis Date  . A-fib ([place])    unspecified  . Anemia    unspecified  . Arthritis    osteoarthritis  . Bleeding hemorrhoid   . Breast cancer ([place])    unspecified  . Cancer ([place])    Breast  . Cataract   . Diverticulosis   . DVT (deep vein thrombosis) in pregnancy   . Hiatal hernia   . Hypertension   . Incontinence   . Osteoarthritis   . Osteoporosis    a. Intolerance to Fosamax b. Bilateral foot fractures c. IV Boniva d. Low vitamin D e. Reclast  . Parkinson disease ([place])   . PE (pulmonary thromboembolism) ([place])   . Post-menopausal atrophic vaginitis 01/28/2017  . Scleritis    Idiopathic a. Prednisone b. s/p methotrexate  . TIA (transient ischemic attack) 1997   Possible  . Urinary urgency   . Vitamin B12 deficiency     Past Surgical History:  Procedure Laterality Date  . ABDOMINAL HYSTERECTOMY    . ABDOMINAL HYSTERECTOMY     partial  . BLADDER TACK SURGERY     Dr. Ouida Sills  . BREAST BIOPSY Left 10/2012   benign  . BREAST BIOPSY Left 11/22/2015   invasive mammary ca   . BREAST EXCISIONAL BIOPSY Left 12/08/2015   lumpectomy  . BREAST LUMPECTOMY Left 11/2015   Invasive mammary carcinoma Grade I  . EYE SURGERY Bilateral    Catarct Extraction with IOL  . FINGER SURGERY Right   . FOOT FRACTURE SURGERY Right 06/2003   pinning,  Dr. Francia Greaves, [facility]  . FOOT SURGERY Left   . INCONTINENCE SURGERY    . NEUROMA SURGERY    . PACEMAKER INSERTION Right 10/02/2016   Procedure: INSERTION PACEMAKER;  Surgeon: Isaias Cowman, MD;  Location: [facility] ORS;  Service: Cardiovascular;  Laterality: Right;  . PARTIAL MASTECTOMY WITH AXILLARY SENTINEL LYMPH NODE BIOPSY Left 12/08/2015   Procedure: PARTIAL MASTECTOMY WITH AXILLARY SENTINEL LYMPH NODE BIOPSY;  Surgeon: Leonie Addalynn Kumari, MD;  Location: [facility] ORS;  Service: General;  Laterality: Left;    Social History   Socioeconomic History  . Marital status: Widowed    Spouse name: Not on file  . Number of children: 2  . Years of education: Not on file  . Highest education level: Not on file  Occupational History  . Not on file  Social Needs  . Financial resource strain: Not on file  . Food insecurity:    Worry: Not on file    Inability: Not on file  . Transportation needs:    Medical: Not on file    Non-medical: Not on file  Tobacco Use  . Smoking status: Never Smoker  . Smokeless tobacco: Never Used  Substance and Sexual Activity  . Alcohol use: No  . Drug use: No  . Sexual activity: Never  Lifestyle  . Physical activity:  Days per week: Not on file    Minutes per session: Not on file  . Stress: Not on file  Relationships  . Social connections:    Talks on phone: Not on file    Gets together: Not on file    Attends religious service: Not on file    Active member of club or organization: Not on file    Attends meetings of clubs or organizations: Not on file    Relationship status: Not on file  . Intimate partner violence:    Fear of current or ex partner: Not on file    Emotionally abused: Not on file    Physically abused: Not on file    Forced sexual activity: Not on file  Other Topics Concern  . Not on file  Social History Narrative   Widowed   2 children   Never smoker   Denies alcohol use   Full Code   Family History  Problem Relation Age of  Onset  . Esophageal cancer Sister   . Kidney disease Neg Hx   . Bladder Cancer Neg Hx   . Breast cancer Neg Hx       VITAL SIGNS BP 123/78   Pulse 61   Temp 98.4 F (36.9 C)   Resp 12   Ht 5' (1.524 m)   Wt 121 lb 1.6 oz (54.9 kg)   SpO2 100%   BMI 23.65 kg/m   Outpatient Encounter Medications as of 12/02/2017  Medication Sig  . acetaminophen (TYLENOL) 325 MG tablet Take 650 mg by mouth 3 (three) times daily as needed.  . bisacodyl (DULCOLAX) 10 MG suppository Place 10 mg rectally daily as needed.  . calcium carbonate (OSCAL) 1500 (600 Ca) MG TABS tablet Take 2 tablets by mouth daily.  . carbidopa-levodopa (SINEMET CR) 50-200 MG tablet Take 3 tablets by mouth 3 (three) times daily.  Marland Kitchen conjugated estrogens (PREMARIN) vaginal cream Apply 0.5 mg ( pea-sized amount) just inside the vaginal introitus with finger-tip weekly on Monday nights  . cyanocobalamin (,VITAMIN B-12,) 1000 MCG/ML injection Inject 1,000 mcg into the skin every 30 (thirty) days. On 1st of the month  . diltiazem (CARDIZEM) 120 MG tablet Take 120 mg by mouth daily.  . furosemide (LASIX) 40 MG tablet Take 40 mg by mouth daily.   . [place] (DERMACLOUD) CREA Apply liberal amount to area of skin irritation topically as needed. Ok to leave at bedside.  . latanoprost (XALATAN) 0.005 % ophthalmic solution Place 1 drop into both eyes at bedtime. Per Dr. Wallace Going  . Lidocaine HCl (ASPERCREME W/LIDOCAINE) 4 % CREA Apply liberal amount topically  to shoulders/ neck or any other area of pain TID prn for pain/OA  . Melatonin 3 MG TABS Take 3 mg by mouth at bedtime.  . mirabegron ER (MYRBETRIQ) 25 MG TB24 tablet Take 25 mg by mouth daily.  . NON FORMULARY Diet Type: Regular diet with Chopped Meats  . ondansetron (ZOFRAN) 4 MG tablet Take 4 mg by mouth every 6 (six) hours as needed for nausea or vomiting.  . potassium chloride SA (K-DUR,KLOR-CON) 20 MEQ tablet Take 20 mEq by mouth daily.   . rivaroxaban (XARELTO)  20 MG TABS tablet Take 20 mg by mouth daily with supper.  Marland Kitchen rOPINIRole (REQUIP) 0.5 MG tablet Take 1 tablet (0.5 mg total) by mouth at bedtime.  . sennosides-docusate sodium (SENOKOT-S) 8.6-50 MG tablet Take 1 tablet by mouth 2 (two) times daily as needed for constipation.  No facility-administered encounter medications on file as of 12/02/2017.      SIGNIFICANT DIAGNOSTIC EXAMS  LABS REVIEWED: PREVIOUS   07-03-17: glucose 122; bun 15; creat 0.81; k+ 3.8; na++ 139; ca 9.6 08-04-17: wbc 6.9; hgb 12.6; hct 36.2; mcv 92.5; plt 311; glucose 83; bun 17; creat 0.63; k+ 3.7; na++ 141; ca 9.0 liver normal albumin 3.3 mag 2.3; tsh 1.983 vit B 12: 800; vit D 51.9   TODAY:   09-30-17: urine culture: multiple species 10-03-17: urine culture: <10,000 colonies   Review of Systems  Constitutional: Negative for malaise/fatigue.  Respiratory: Negative for cough and shortness of breath.   Cardiovascular: Negative for chest pain, palpitations and leg swelling.  Gastrointestinal: Negative for abdominal pain, constipation and heartburn.  Musculoskeletal: Negative for back pain, joint pain and myalgias.  Skin: Negative.   Neurological: Negative for dizziness.  Psychiatric/Behavioral: The patient is not nervous/anxious.      Physical Exam  Constitutional: She is oriented to person, place, and time. No distress.  Thin   Neck: No thyromegaly present.  Cardiovascular: Normal rate, normal heart sounds and intact distal pulses.  Heart rate irregular   Pulmonary/Chest: Effort normal and breath sounds normal. No respiratory distress.  Abdominal: Soft. Bowel sounds are normal. She exhibits no distension. There is no tenderness.  Musculoskeletal: She exhibits no edema.  Able to move all extremities Kyphosis Uses walker   Lymphadenopathy:    She has no cervical adenopathy.  Neurological: She is alert and oriented to person, place, and time.  Skin: Skin is warm and dry. She is not diaphoretic.  Psychiatric:  She has a normal mood and affect.       ASSESSMENT/PLAN  TODAY:   1. Parkinson disease: is stable will continue sinemet 50/200 mg ( 3 tabs)three times daily and requip 0.5 mg nightly   2. Overactive bladder: has post-menopausal atrophic vaginitis: is stable will continue myrbetric 25 mg daily and premarin vaginal cream weekly   3,  bilateral glaucoma: is stable will continue xalatan to both eyes nightly   4. Chronic constipation: is stable will continue senna s twice daily and twice daily as needed   PREVIOUS  5. Paroxysmal atrial fibrillation/has sick sinus snydomre: status post pacemaker  heart rate stable; will continue cardizem 120 mg daily for rate control is taking xarelto 20 mg daily   6. DVT of distal vein of right lower extremity; PE: is stable is on long xarelto 20 mg daily   7. Chronic diastolic heart failure: is stable will continue lasix 40 mg daily with k+ 20 meq daily   8. Hypertensive heart disease with heart failure: is stable b/p 123/64: will continue cardizem 120 mg daily      MD is aware of resident's narcotic use and is in agreement with current plan of care. We will attempt to wean resident as apropriate   Ok Edwards NP [facility]  Contact [phone] Monday through Friday 8am- 5pm  After hours call [phone]

## 2017-12-05 DIAGNOSIS — K5909 Other constipation: Secondary | ICD-10-CM | POA: Insufficient documentation

## 2017-12-08 ENCOUNTER — Other Ambulatory Visit: Payer: Medicare Other

## 2017-12-09 ENCOUNTER — Ambulatory Visit
Admission: RE | Admit: 2017-12-09 | Discharge: 2017-12-09 | Disposition: A | Discharge status: Home / Self Care | Payer: Medicare Other | Source: Ambulatory Visit | Attending: Urgent Care | Admitting: Urgent Care

## 2017-12-09 DIAGNOSIS — M810 Age-related osteoporosis without current pathological fracture: Secondary | ICD-10-CM | POA: Diagnosis present

## 2017-12-09 DIAGNOSIS — Z170 Estrogen receptor positive status [ER+]: Secondary | ICD-10-CM | POA: Diagnosis present

## 2017-12-09 DIAGNOSIS — C50412 Malignant neoplasm of upper-outer quadrant of left female breast: Secondary | ICD-10-CM | POA: Insufficient documentation

## 2017-12-10 ENCOUNTER — Non-Acute Institutional Stay (SKILLED_NURSING_FACILITY): Payer: Medicare Other | Admitting: Adult Health

## 2017-12-10 DIAGNOSIS — I825Z1 Chronic embolism and thrombosis of unspecified deep veins of right distal lower extremity: Secondary | ICD-10-CM | POA: Diagnosis not present

## 2017-12-10 DIAGNOSIS — G20 Parkinson's disease: Secondary | ICD-10-CM | POA: Diagnosis not present

## 2017-12-10 DIAGNOSIS — G20A1 Parkinson's disease without dyskinesia, without mention of fluctuations: Secondary | ICD-10-CM

## 2017-12-10 DIAGNOSIS — I5032 Chronic diastolic (congestive) heart failure: Secondary | ICD-10-CM

## 2017-12-10 DIAGNOSIS — I480 Paroxysmal atrial fibrillation: Secondary | ICD-10-CM

## 2017-12-11 ENCOUNTER — Encounter: Payer: Self-pay | Admitting: Adult Health

## 2017-12-11 NOTE — ACP (Advance Care Planning) (Signed)
We have discussed with her son Verdella's advanced directives. We did discuss CPR and what to expect including breaking sternum; ribs; and brain death we did discuss that she would more than likely end up on ventilator he has decided to make her a DNR. He does want fully hospitalizations; ivf and abt. We did discuss tube feedings as well; he has decided not to have a tube feeding. He has verbalized understanding. We have filled out her MOST form.  Time spent with discussion: 50 minutes:

## 2017-12-11 NOTE — Progress Notes (Signed)
[facility]:   [facility] Room Number: [place] of Service:  SNF (31)   CODE STATUS: DNR  No Known Allergies  Chief Complaint  Patient presents with  . Acute Visit    Care Plan Meeting    HPI:  We have come together for her routine care plan meeting. She does have family present. There are no reports of changes in appetite; no uncontrolled pain; no anxiety or depressive thoughts.  We have had an extensive discussion for her advanced directives: her MOST form has been filled out for DNR; and no tube feeding. We did discuss cpr; hospitalizations; abt and ivf her son has verbalized understanding.   Past Medical History:  Diagnosis Date  . A-fib ([place])    unspecified  . Anemia    unspecified  . Arthritis    osteoarthritis  . Bleeding hemorrhoid   . Breast cancer ([place])    unspecified  . Cancer ([place])    Breast  . Cataract   . Diverticulosis   . DVT (deep vein thrombosis) in pregnancy   . Hiatal hernia   . Hypertension   . Incontinence   . Osteoarthritis   . Osteoporosis    a. Intolerance to Fosamax b. Bilateral foot fractures c. IV Boniva d. Low vitamin D e. Reclast  . Parkinson disease ([place])   . PE (pulmonary thromboembolism) ([place])   . Post-menopausal atrophic vaginitis 01/28/2017  . Scleritis    Idiopathic a. Prednisone b. s/p methotrexate  . TIA (transient ischemic attack) 1997   Possible  . Urinary urgency   . Vitamin B12 deficiency     Past Surgical History:  Procedure Laterality Date  . ABDOMINAL HYSTERECTOMY    . ABDOMINAL HYSTERECTOMY     partial  . BLADDER TACK SURGERY     Dr. Ouida Sills  . BREAST BIOPSY Left 10/2012   benign  . BREAST BIOPSY Left 11/22/2015   invasive mammary ca   . BREAST EXCISIONAL BIOPSY Left 12/08/2015   lumpectomy  . BREAST LUMPECTOMY Left 11/2015   Invasive mammary carcinoma Grade I  . EYE SURGERY Bilateral    Catarct Extraction with IOL  . FINGER SURGERY Right   . FOOT FRACTURE SURGERY Right  06/2003   pinning, Dr. Francia Greaves, [facility]  . FOOT SURGERY Left   . INCONTINENCE SURGERY    . NEUROMA SURGERY    . PACEMAKER INSERTION Right 10/02/2016   Procedure: INSERTION PACEMAKER;  Surgeon: Isaias Cowman, MD;  Location: [facility] ORS;  Service: Cardiovascular;  Laterality: Right;  . PARTIAL MASTECTOMY WITH AXILLARY SENTINEL LYMPH NODE BIOPSY Left 12/08/2015   Procedure: PARTIAL MASTECTOMY WITH AXILLARY SENTINEL LYMPH NODE BIOPSY;  Surgeon: Leonie , MD;  Location: [facility] ORS;  Service: General;  Laterality: Left;    Social History   Socioeconomic History  . Marital status: Widowed    Spouse name: Not on file  . Number of children: 2  . Years of education: Not on file  . Highest education level: Not on file  Occupational History  . Not on file  Social Needs  . Financial resource strain: Not on file  . Food insecurity:    Worry: Not on file    Inability: Not on file  . Transportation needs:    Medical: Not on file    Non-medical: Not on file  Tobacco Use  . Smoking status: Never Smoker  . Smokeless tobacco: Never Used  Substance and Sexual Activity  . Alcohol use: No  .  Drug use: No  . Sexual activity: Never  Lifestyle  . Physical activity:    Days per week: Not on file    Minutes per session: Not on file  . Stress: Not on file  Relationships  . Social connections:    Talks on phone: Not on file    Gets together: Not on file    Attends religious service: Not on file    Active member of club or organization: Not on file    Attends meetings of clubs or organizations: Not on file    Relationship status: Not on file  . Intimate partner violence:    Fear of current or ex partner: Not on file    Emotionally abused: Not on file    Physically abused: Not on file    Forced sexual activity: Not on file  Other Topics Concern  . Not on file  Social History Narrative   Widowed   2 children   Never smoker   Denies alcohol use   Full Code   Family History    Problem Relation Age of Onset  . Esophageal cancer Sister   . Kidney disease Neg Hx   . Bladder Cancer Neg Hx   . Breast cancer Neg Hx       VITAL SIGNS BP 123/78   Pulse 61   Temp 98.4 F (36.9 C)   Resp 12   Ht 5' (1.524 m)   Wt 119 lb 1.6 oz (54 kg)   SpO2 100%   BMI 23.26 kg/m   Outpatient Encounter Medications as of 12/10/2017  Medication Sig  . acetaminophen (TYLENOL) 325 MG tablet Take 650 mg by mouth 3 (three) times daily as needed.  . bisacodyl (DULCOLAX) 10 MG suppository Place 10 mg rectally daily as needed.  . calcium carbonate (OSCAL) 1500 (600 Ca) MG TABS tablet Take 2 tablets by mouth daily.  . carbidopa-levodopa (SINEMET CR) 50-200 MG tablet Take 3 tablets by mouth 3 (three) times daily.  Marland Kitchen conjugated estrogens (PREMARIN) vaginal cream Apply 0.5 mg ( pea-sized amount) just inside the vaginal introitus with finger-tip weekly on Monday nights  . cyanocobalamin (,VITAMIN B-12,) 1000 MCG/ML injection Inject 1,000 mcg into the skin every 30 (thirty) days. On 1st of the month  . diltiazem (CARDIZEM) 120 MG tablet Take 120 mg by mouth daily.  . furosemide (LASIX) 40 MG tablet Take 40 mg by mouth daily.   . [place] (DERMACLOUD) CREA Apply liberal amount to area of skin irritation topically as needed. Ok to leave at bedside.  . latanoprost (XALATAN) 0.005 % ophthalmic solution Place 1 drop into both eyes at bedtime. Per Dr. Wallace Going  . Lidocaine HCl (ASPERCREME W/LIDOCAINE) 4 % CREA Apply liberal amount topically  to shoulders/ neck or any other area of pain TID prn for pain/OA  . Melatonin 3 MG TABS Give 2 tablets (6 mg) by mouth at bedtime  . mirabegron ER (MYRBETRIQ) 25 MG TB24 tablet Take 25 mg by mouth daily.  . NON FORMULARY Diet Type: Regular diet  . ondansetron (ZOFRAN) 4 MG tablet Take 4 mg by mouth every 6 (six) hours as needed for nausea or vomiting.  . potassium chloride SA (K-DUR,KLOR-CON) 20 MEQ tablet Take 20 mEq by mouth daily.   .  rivaroxaban (XARELTO) 20 MG TABS tablet Take 20 mg by mouth daily with supper.  Marland Kitchen rOPINIRole (REQUIP) 0.5 MG tablet Take 1 tablet (0.5 mg total) by mouth at bedtime.  . sennosides-docusate sodium (SENOKOT-S) 8.6-50  MG tablet Take 1 tablet by mouth 2 (two) times daily as needed for constipation.   No facility-administered encounter medications on file as of 12/10/2017.      SIGNIFICANT DIAGNOSTIC EXAMS  LABS REVIEWED: PREVIOUS   07-03-17: glucose 122; bun 15; creat 0.81; k+ 3.8; na++ 139; ca 9.6 08-04-17: wbc 6.9; hgb 12.6; hct 36.2; mcv 92.5; plt 311; glucose 83; bun 17; creat 0.63; k+ 3.7; na++ 141; ca 9.0 liver normal albumin 3.3 mag 2.3; tsh 1.983 vit B 12: 800; vit D 51.9  09-30-17: urine culture: multiple species 10-03-17: urine culture: <10,000 colonies   NO NEW LABS.    Review of Systems  Constitutional: Negative for malaise/fatigue.  Respiratory: Negative for cough and shortness of breath.   Cardiovascular: Negative for chest pain, palpitations and leg swelling.  Gastrointestinal: Negative for abdominal pain, constipation and heartburn.  Musculoskeletal: Negative for back pain, joint pain and myalgias.  Skin: Negative.   Neurological: Negative for dizziness.  Psychiatric/Behavioral: The patient is not nervous/anxious.     Physical Exam  Constitutional: She is oriented to person, place, and time. No distress.  Thin   Neck: No thyromegaly present.  Cardiovascular: Regular rhythm, normal heart sounds and intact distal pulses.  Heart rate irregular   Pulmonary/Chest: Effort normal and breath sounds normal. No respiratory distress.  Abdominal: Soft. Bowel sounds are normal. She exhibits no distension. There is no tenderness.  Musculoskeletal: She exhibits no edema.  Able to move all extremities Kyphosis Uses walker    Lymphadenopathy:    She has no cervical adenopathy.  Neurological: She is alert and oriented to person, place, and time.  Skin: Skin is warm and dry. She is not  diaphoretic.  Psychiatric: She has a normal mood and affect.      ASSESSMENT/PLAN  TODAY:   1. Parkinson disease:  2. Paroxysmal atrial fibrillation/has sick sinus snydomre: status post pacemaker   3. DVT of distal vein of right lower extremity;  4. Chronic diastolic heart failure:   Will continue her current medications Will continue her current plan of care Will initiate DNR and MOST form.   Time spent with family and patient: 70 minutes: time spent with advanced directive 50 minutes: has verbalized understanding.    MD is aware of resident's narcotic use and is in agreement with current plan of care. We will attempt to wean resident as apropriate   Ok Edwards NP [facility]  Contact [phone] Monday through Friday 8am- 5pm  After hours call [phone]

## 2017-12-23 ENCOUNTER — Other Ambulatory Visit: Payer: Self-pay | Admitting: Hematology and Oncology

## 2017-12-28 ENCOUNTER — Encounter
Admission: RE | Admit: 2017-12-28 | Discharge: 2017-12-28 | Disposition: A | Discharge status: Home / Self Care | Payer: Medicaid Other | Source: Ambulatory Visit | Attending: Internal Medicine | Admitting: Internal Medicine

## 2017-12-29 ENCOUNTER — Inpatient Hospital Stay: Payer: Medicare Other | Attending: Hematology and Oncology

## 2017-12-29 ENCOUNTER — Encounter: Payer: Self-pay | Admitting: Hematology and Oncology

## 2017-12-29 ENCOUNTER — Inpatient Hospital Stay (HOSPITAL_BASED_OUTPATIENT_CLINIC_OR_DEPARTMENT_OTHER): Payer: Medicare Other | Admitting: Hematology and Oncology

## 2017-12-29 ENCOUNTER — Inpatient Hospital Stay: Payer: Medicare Other

## 2017-12-29 VITALS — BP 136/84 | HR 75 | Temp 98.5°F | Resp 18 | Ht 60.0 in | Wt 120.0 lb

## 2017-12-29 DIAGNOSIS — C50412 Malignant neoplasm of upper-outer quadrant of left female breast: Secondary | ICD-10-CM

## 2017-12-29 DIAGNOSIS — Z86711 Personal history of pulmonary embolism: Secondary | ICD-10-CM

## 2017-12-29 DIAGNOSIS — Z7901 Long term (current) use of anticoagulants: Secondary | ICD-10-CM | POA: Diagnosis not present

## 2017-12-29 DIAGNOSIS — Z170 Estrogen receptor positive status [ER+]: Secondary | ICD-10-CM

## 2017-12-29 DIAGNOSIS — Z87440 Personal history of urinary (tract) infections: Secondary | ICD-10-CM

## 2017-12-29 DIAGNOSIS — M810 Age-related osteoporosis without current pathological fracture: Secondary | ICD-10-CM | POA: Diagnosis not present

## 2017-12-29 DIAGNOSIS — Z86718 Personal history of other venous thrombosis and embolism: Secondary | ICD-10-CM | POA: Diagnosis not present

## 2017-12-29 DIAGNOSIS — Z853 Personal history of malignant neoplasm of breast: Secondary | ICD-10-CM | POA: Insufficient documentation

## 2017-12-29 DIAGNOSIS — G20 Parkinson's disease: Secondary | ICD-10-CM | POA: Diagnosis not present

## 2017-12-29 DIAGNOSIS — G620 Drug-induced polyneuropathy: Secondary | ICD-10-CM

## 2017-12-29 LAB — CBC WITH DIFFERENTIAL/PLATELET
Abs Immature Granulocytes: 0.02 10*3/uL (ref 0.00–0.07)
BASOS PCT: 1 %
Basophils Absolute: 0 10*3/uL (ref 0.0–0.1)
EOS PCT: 3 %
Eosinophils Absolute: 0.3 10*3/uL (ref 0.0–0.5)
HCT: 39.9 % (ref 36.0–46.0)
HEMOGLOBIN: 13 g/dL (ref 12.0–15.0)
Immature Granulocytes: 0 %
Lymphocytes Relative: 40 %
Lymphs Abs: 3.2 10*3/uL (ref 0.7–4.0)
MCH: 30.7 pg (ref 26.0–34.0)
MCHC: 32.6 g/dL (ref 30.0–36.0)
MCV: 94.3 fL (ref 80.0–100.0)
MONO ABS: 0.8 10*3/uL (ref 0.1–1.0)
Monocytes Relative: 9 %
NRBC: 0 % (ref 0.0–0.2)
Neutro Abs: 3.9 10*3/uL (ref 1.7–7.7)
Neutrophils Relative %: 47 %
Platelets: 292 10*3/uL (ref 150–400)
RBC: 4.23 MIL/uL (ref 3.87–5.11)
RDW: 14.1 % (ref 11.5–15.5)
WBC: 8.2 10*3/uL (ref 4.0–10.5)

## 2017-12-29 LAB — COMPREHENSIVE METABOLIC PANEL
ALBUMIN: 3.9 g/dL (ref 3.5–5.0)
ALT: <5 U/L (ref 0–44)
ANION GAP: 10 (ref 5–15)
AST: 14 U/L — ABNORMAL LOW (ref 15–41)
Alkaline Phosphatase: 66 U/L (ref 38–126)
BUN: 20 mg/dL (ref 8–23)
CALCIUM: 9.8 mg/dL (ref 8.9–10.3)
CHLORIDE: 98 mmol/L (ref 98–111)
CO2: 26 mmol/L (ref 22–32)
Creatinine, Ser: 0.81 mg/dL (ref 0.44–1.00)
GFR calc Af Amer: >60 mL/min (ref >60)
GFR calc non Af Amer: >60 mL/min (ref >60)
GLUCOSE: 107 mg/dL — AB (ref 70–99)
Potassium: 3.8 mmol/L (ref 3.5–5.1)
SODIUM: 134 mmol/L — AB (ref 135–145)
Total Bilirubin: 0.6 mg/dL (ref 0.3–1.2)
Total Protein: 7.5 g/dL (ref 6.5–8.1)

## 2017-12-29 NOTE — Progress Notes (Signed)
[facility] day:  12/29/2017   Chief Complaint: Laura Cisneros is a 84 y.o. female with clinical stage I left breast cancer and osteoporosis who is seen for 6 month assessment.  HPI: The patient was last seen in the medical oncology clinic on 06/26/2017.  At that time, she felt "alright".  She denied breast concerns. Exam revealed fibrocystic changes in the LEFT breast.  Creatinine had increased from 0.75 to 0.95.  Creatinine clearance was 28.3 ml/min.  Prolia was held.  She received Prolia on 07/03/2017.  Creatinine was 0.81.  Bilateral mammogram on 11/10/2017 revealed no evidence of malignancy within either breast.  There were stable postsurgical changes within the left breast.  Bone density on 12/09/2017 revealed osteoporosis with a T-score of -3.0 in the AP spine L1-L4 and -2.7 in the right femur.  During the interim, she denies any complaints.  She feels "ok".  She denies any breast concerns.  She notes that she "can't talk too good" (comes and goes).  She denies any problems with the Prolia injections.  She [facility].   Past Medical History:  Diagnosis Date  . A-fib ([place])    unspecified  . Anemia    unspecified  . Arthritis    osteoarthritis  . Bleeding hemorrhoid   . Breast cancer ([place])    unspecified  . Cancer ([place])    Breast  . Cataract   . Diverticulosis   . DVT (deep vein thrombosis) in pregnancy   . Hiatal hernia   . Hypertension   . Incontinence   . Osteoarthritis   . Osteoporosis    a. Intolerance to Fosamax b. Bilateral foot fractures c. IV Boniva d. Low vitamin D e. Reclast  . Parkinson disease ([place])   . PE (pulmonary thromboembolism) ([place])   . Post-menopausal atrophic vaginitis 01/28/2017  . Scleritis    Idiopathic a. Prednisone b. s/p methotrexate  . TIA (transient ischemic attack) 1997   Possible  . Urinary urgency   . Vitamin B12 deficiency     Past  Surgical History:  Procedure Laterality Date  . ABDOMINAL HYSTERECTOMY    . ABDOMINAL HYSTERECTOMY     partial  . BLADDER TACK SURGERY     Dr. Ouida Sills  . BREAST BIOPSY Left 10/2012   benign  . BREAST BIOPSY Left 11/22/2015   invasive mammary ca   . BREAST EXCISIONAL BIOPSY Left 12/08/2015   lumpectomy  . BREAST LUMPECTOMY Left 11/2015   Invasive mammary carcinoma Grade I  . EYE SURGERY Bilateral    Catarct Extraction with IOL  . FINGER SURGERY Right   . FOOT FRACTURE SURGERY Right 06/2003   pinning, [facility] Francia [facility]  . FOOT SURGERY Left   . INCONTINENCE SURGERY    . NEUROMA SURGERY    . PACEMAKER INSERTION Right 10/02/2016   Procedure: INSERTION PACEMAKER;  Surgeon: Isaias Cowman, MD;  Location: [facility] ORS;  Service: Cardiovascular;  Laterality: Right;  . PARTIAL MASTECTOMY WITH AXILLARY SENTINEL LYMPH NODE BIOPSY Left 12/08/2015   Procedure: PARTIAL MASTECTOMY WITH AXILLARY SENTINEL LYMPH NODE BIOPSY;  Surgeon: [name], MD;  Location: [facility] ORS;  Service: General;  Laterality: Left;    Family History  Problem Relation Age of Onset  . Esophageal cancer Sister   . Kidney disease Neg Hx   . Bladder Cancer Neg Hx   . Breast cancer Neg Hx     Social History:  reports that  she has never smoked. She has never used smokeless tobacco. She reports that she does not drink alcohol or use drugs.  She denies any exposure to radiation or toxins.  She previously worked in a [employer].  She lives at the [place] [facility] [place].  Her son's phone number is [phone].  Her grand daughter is [name].  The patient is accompanied by her son, Laura Cisneros.  Allergies: No Known Allergies  Current Medications: Current Outpatient Medications  Medication Sig Dispense Refill  . calcium carbonate (OSCAL) 1500 (600 Ca) MG TABS tablet Take 2 tablets by mouth daily.    . carbidopa-levodopa (SINEMET CR) 50-200 MG tablet Take 3 tablets by mouth 3 (three) times daily.    Marland Kitchen diltiazem (CARDIZEM) 120 MG  tablet Take 120 mg by mouth daily.    . furosemide (LASIX) 40 MG tablet Take 40 mg by mouth daily.     Marland Kitchen latanoprost (XALATAN) 0.005 % ophthalmic solution Place 1 drop into both eyes at bedtime. Per Dr. Wallace Going    . Melatonin 3 MG TABS Give 2 tablets (6 mg) by mouth at bedtime    . mirabegron ER (MYRBETRIQ) 25 MG TB24 tablet Take 25 mg by mouth daily.    . NON FORMULARY Diet Type: Regular diet    . potassium chloride SA (K-DUR,KLOR-CON) 20 MEQ tablet Take 20 mEq by mouth daily.     . rivaroxaban (XARELTO) 20 MG TABS tablet Take 20 mg by mouth daily with supper.    Marland Kitchen rOPINIRole (REQUIP) 0.5 MG tablet Take 1 tablet (0.5 mg total) by mouth at bedtime.    Marland Kitchen acetaminophen (TYLENOL) 325 MG tablet Take 650 mg by mouth 3 (three) times daily as needed.    . conjugated estrogens (PREMARIN) vaginal cream Apply 0.5 mg ( pea-sized amount) just inside the vaginal introitus with finger-tip weekly on Monday nights    . Hyprom-Naphaz-Polysorb-Zn Sulf (CLEAR EYES COMPLETE) SOLN Place 2 drops into both eyes every 2 (two) hours as needed.    . Lidocaine HCl (ASPERCREME W/LIDOCAINE) 4 % CREA Apply liberal amount topically  to shoulders/ neck or any other area of pain TID prn for pain/OA    . ondansetron (ZOFRAN) 4 MG tablet Take 4 mg by mouth every 6 (six) hours as needed for nausea or vomiting.     No current facility-administered medications for this visit.     Review of Systems  Constitutional: Negative for chills, diaphoresis, fever, malaise/fatigue and weight loss (stable).       Feels "ok".  HENT: Negative.  Negative for congestion, nosebleeds, sinus pain and sore throat.   Eyes: Negative.  Negative for double vision, pain and redness.  Respiratory: Negative.  Negative for cough, hemoptysis, sputum production and shortness of breath.   Cardiovascular: Negative.  Negative for chest pain, palpitations, orthopnea, leg swelling and PND.  Gastrointestinal: Negative.  Negative for abdominal pain, blood in  stool, constipation, diarrhea, melena, nausea and vomiting.  Genitourinary: Negative.  Negative for dysuria, frequency, hematuria and urgency.  Musculoskeletal: Positive for joint pain. Negative for back pain, falls, myalgias and neck pain.  Skin: Negative.  Negative for itching and rash.  Neurological: Negative for dizziness, tremors, focal weakness, seizures, weakness and headaches.  Endo/Heme/Allergies: Negative.  Does not bruise/bleed easily.  Psychiatric/Behavioral: Negative for depression, memory loss and suicidal ideas. The patient is not nervous/anxious and does not have insomnia.   All other systems reviewed and are negative.  Performance status (ECOG): 2  Physical Exam: Blood pressure 136/84, pulse 75, temperature  98.5 F (36.9 C), temperature source Tympanic, resp. rate 18, height 5' (1.524 m), weight 120 lb (54.4 kg), SpO2 98 %. GENERAL:  Thin elderly woman sitting comfortably in a wheelchair in the exam room in no acute distress. MENTAL STATUS:  Alert and oriented to person, place and time. HEAD:  Curly gray hair.  Normocephalic, atraumatic, face symmetric, no Cushingoid features. EYES:  Laura Cisneros eyes.  Pupils equal round and reactive to light and accomodation.  No conjunctivitis or scleral icterus. ENT:  Oropharynx clear without lesion.  Tongue normal. Mucous membranes moist.  RESPIRATORY:  Clear to auscultation without rales, wheezes or rhonchi. CARDIOVASCULAR:  Regular rate and rhythm without murmur, rub or gallop. BREAST:  Right breast without masses, skin changes or nipple discharge.  Left breast s/p lumpectomy without masses, skin changes or nipple discharge. ABDOMEN:  Soft, non-tender, with active bowel sounds, and no hepatosplenomegaly.  No masses. SKIN:  No rashes, ulcers or lesions. EXTREMITIES: No edema, no skin discoloration or tenderness.  No palpable cords. LYMPH NODES: No palpable cervical, supraclavicular, axillary or inguinal adenopathy  NEUROLOGICAL:  Unremarkable. PSYCH:  Appropriate.    Appointment on 12/29/2017  Component Date Value Ref Range Status  . Sodium 12/29/2017 134* 135 - 145 mmol/L Final  . Potassium 12/29/2017 3.8  3.5 - 5.1 mmol/L Final  . Chloride 12/29/2017 98  98 - 111 mmol/L Final  . CO2 12/29/2017 26  22 - 32 mmol/L Final  . Glucose, Bld 12/29/2017 107* 70 - 99 mg/dL Final  . BUN 12/29/2017 20  8 - 23 mg/dL Final  . Creatinine, Ser 12/29/2017 0.81  0.44 - 1.00 mg/dL Final  . Calcium 12/29/2017 9.8  8.9 - 10.3 mg/dL Final  . Total Protein 12/29/2017 7.5  6.5 - 8.1 g/dL Final  . Albumin 12/29/2017 3.9  3.5 - 5.0 g/dL Final  . AST 12/29/2017 14* 15 - 41 U/L Final  . ALT 12/29/2017 <5  0 - 44 U/L Final   RESULTS VERIFIED BY REPEAT TESTING  . Alkaline Phosphatase 12/29/2017 66  38 - 126 U/L Final  . Total Bilirubin 12/29/2017 0.6  0.3 - 1.2 mg/dL Final  . GFR calc non Af Amer 12/29/2017 >60  >60 mL/min Final  . GFR calc Af Amer 12/29/2017 >60  >60 mL/min Final  . Anion gap 12/29/2017 10  5 - 15 Final   Performed at [facility], [address], [place], [place] [address]  . WBC 12/29/2017 8.2  4.0 - 10.5 K/uL Final  . RBC 12/29/2017 4.23  3.87 - 5.11 MIL/uL Final  . Hemoglobin 12/29/2017 13.0  12.0 - 15.0 g/dL Final  . HCT 12/29/2017 39.9  36.0 - 46.0 % Final  . MCV 12/29/2017 94.3  80.0 - 100.0 fL Final  . MCH 12/29/2017 30.7  26.0 - 34.0 pg Final  . MCHC 12/29/2017 32.6  30.0 - 36.0 g/dL Final  . RDW 12/29/2017 14.1  11.5 - 15.5 % Final  . Platelets 12/29/2017 292  150 - 400 K/uL Final  . nRBC 12/29/2017 0.0  0.0 - 0.2 % Final  . Neutrophils Relative % 12/29/2017 47  % Final  . Neutro Abs 12/29/2017 3.9  1.7 - 7.7 K/uL Final  . Lymphocytes Relative 12/29/2017 40  % Final  . Lymphs Abs 12/29/2017 3.2  0.7 - 4.0 K/uL Final  . Monocytes Relative 12/29/2017 9  % Final  . Monocytes Absolute 12/29/2017 0.8  0.1 - 1.0 K/uL Final  . Eosinophils Relative 12/29/2017 3  % Final  .  Eosinophils Absolute  12/29/2017 0.3  0.0 - 0.5 K/uL Final  . Basophils Relative 12/29/2017 1  % Final  . Basophils Absolute 12/29/2017 0.0  0.0 - 0.1 K/uL Final  . Immature Granulocytes 12/29/2017 0  % Final  . Abs Immature Granulocytes 12/29/2017 0.02  0.00 - 0.07 K/uL Final   Performed at [facility], [address] [place], [place] [address]    Assessment:  Laura Cisneros is a 84 y.o. female with stage I left breast cancer s/p partial mastectomy and sentinel lymph node biopsy on 12/08/2015.  Pathology revealed a 0.7 cm grade I  invasive mammary carcinoma of no special type.  There was no lymphvascular invasion.  Margins were clear.  One sentinel lymph node was negative.  Tumor was ER positive (> 90%), PR negative, and HER-2/neu 2+ (negative by FISH).  Pathologic stage was T1bN0.  CA 27.29 was 27.6 on 12/08/2015.  Bilateral diagnostic mammogram on 11/06/2015 revealed a suspicious 5 mm mass in the upper outer left breast.  Bilateral mammogram on 11/08/2016 revealed no evidence of malignancy in either breast.  Bilateral mammogram on 11/10/2017 revealed no evidence of malignancy within either breast.  There were stable postsurgical changes within the left breast.  Radiation therapy was deferred secondary to her age, Parkinson's disease, and small well differentiated ER/PR+ tumor.  She began tamoxifen on 01/12/2016.  Tamoxifen was discontinued on 06/18/2016 after a DVT and pulmonary embolism.  She declined further endocrine therapy.  CA27.29 has been followed: 27.6 on 12/04/2015, 24.4 on 05/17/2016, 32.3 on 09/16/2016, 27.9 on 12/24/2016, and 28 on 03/31/2017.  She has osteoporosis and was previously on Zometa for 5 years.  Bone density on 12/05/2015 revealed osteoporosis with a T-score of -2.9 in AP spine L1-L4 and -2.5 in the right femoral neck.  Bone density on 12/09/2017 revealed osteoporosis with a T-score of -3.0 in the AP spine L1-L4 and -2.7 in the right femur.  She began Prolia on 12/19/2015 (last  injection on 07/03/2017).  She has Parkinson's disease.  Gait is unstable.  She has a history of recurrent UTIs, vaginal atrophy, and incontinence.  She is using estrogen cream sparingly (once a week).  She has a history of iron deficiency.  She notes 2 colonoscopies in the past. She has been on B12 shots for years.   She was diagnosed with a DVT and pulmonary embolism on 06/05/2016.  Right lower extremity duplex revealed a DVT in the right lower calf veins.  CT angiogram revealed an acute embolism of the right lower lobar artery extending into the anterior, lateral and posterior basal segmental arteries within the right upper lobe anterior segmental artery.  There was no CT evidence of acute right heart strain.  She is on Xarelto.  Symptomatically, she denies any complaints.  Exam is stable.  Calcium is 9.8.  Creatinine is 0.81.  Plan: 1. Labs today:  CBC with diff, CMP. 2. Stage I left breast cancer:  Clinically doing well.  Exam without evidence of recurrent disease.  Review interval mammogram- no evidence of recurrent disease. 3. Osteoporosis:  Review interval bone density study.  Patient on calcium and vitamin D.  Prolia on 01/01/2018. 4.  Discuss transition to [place].  Patient wishes to stay in [place].  5.  RTC in 6 months for MD (Dr Tasia Catchings)  assessment, labs (CBC with diff, CMP, CA27.29) and +/- Prolia.     Lequita Asal, MD  12/29/2017, 4:35 PM

## 2017-12-29 NOTE — Progress Notes (Signed)
No new changes noted today 

## 2018-01-01 ENCOUNTER — Inpatient Hospital Stay: Payer: Medicare Other

## 2018-01-01 DIAGNOSIS — Z853 Personal history of malignant neoplasm of breast: Secondary | ICD-10-CM | POA: Diagnosis not present

## 2018-01-01 DIAGNOSIS — M810 Age-related osteoporosis without current pathological fracture: Secondary | ICD-10-CM

## 2018-01-01 MED ORDER — DENOSUMAB 60 MG/ML ~~LOC~~ SOSY
60.0000 mg | PREFILLED_SYRINGE | Freq: Once | SUBCUTANEOUS | Status: AC
  Administered 2018-01-01: 60 mg via SUBCUTANEOUS
  Filled 2018-01-01: qty 1

## 2018-01-02 ENCOUNTER — Non-Acute Institutional Stay (SKILLED_NURSING_FACILITY): Payer: Medicare Other | Admitting: Adult Health

## 2018-01-02 ENCOUNTER — Encounter: Payer: Self-pay | Admitting: Adult Health

## 2018-01-02 DIAGNOSIS — I480 Paroxysmal atrial fibrillation: Secondary | ICD-10-CM

## 2018-01-02 DIAGNOSIS — I5032 Chronic diastolic (congestive) heart failure: Secondary | ICD-10-CM | POA: Diagnosis not present

## 2018-01-02 DIAGNOSIS — I2699 Other pulmonary embolism without acute cor pulmonale: Secondary | ICD-10-CM | POA: Diagnosis not present

## 2018-01-02 DIAGNOSIS — I825Z1 Chronic embolism and thrombosis of unspecified deep veins of right distal lower extremity: Secondary | ICD-10-CM | POA: Diagnosis not present

## 2018-01-02 DIAGNOSIS — I495 Sick sinus syndrome: Secondary | ICD-10-CM

## 2018-01-02 NOTE — Progress Notes (Signed)
[facility]   [facility] Room Number: [place] of Service:  SNF (31)   CODE STATUS: DNR  No Known Allergies  Chief Complaint  Patient presents with  . Medical Management of Chronic Issues    Af (paroxysmal atrial fibrillation); chronic diastolic (congestive) heart failure; chronic deep vein thrombosis of distal vein of right lower extremity; PE; sick sinus syndrome.     HPI:  She is a 84 year old long term resident of this facility being seen for the management of her chronic illnesses: afib; diastolic heart failure; dvt; pe. She denies any chest pain; shortness of breath; no uncontrolled pain; no changes in appetite.   Past Medical History:  Diagnosis Date  . A-fib ([place])    unspecified  . Anemia    unspecified  . Arthritis    osteoarthritis  . Bleeding hemorrhoid   . Breast cancer ([place])    unspecified  . Cancer ([place])    Breast  . Cataract   . Diverticulosis   . DVT (deep vein thrombosis) in pregnancy   . Hiatal hernia   . Hypertension   . Incontinence   . Osteoarthritis   . Osteoporosis    a. Intolerance to Fosamax b. Bilateral foot fractures c. IV Boniva d. Low vitamin D e. Reclast  . Parkinson disease ([place])   . PE (pulmonary thromboembolism) ([place])   . Post-menopausal atrophic vaginitis 01/28/2017  . Scleritis    Idiopathic a. Prednisone b. s/p methotrexate  . TIA (transient ischemic attack) 1997   Possible  . Urinary urgency   . Vitamin B12 deficiency     Past Surgical History:  Procedure Laterality Date  . ABDOMINAL HYSTERECTOMY    . ABDOMINAL HYSTERECTOMY     partial  . BLADDER TACK SURGERY     Dr. Ouida Sills  . BREAST BIOPSY Left 10/2012   benign  . BREAST BIOPSY Left 11/22/2015   invasive mammary ca   . BREAST EXCISIONAL BIOPSY Left 12/08/2015   lumpectomy  . BREAST LUMPECTOMY Left 11/2015   Invasive mammary carcinoma Grade I  . EYE SURGERY Bilateral    Catarct Extraction with IOL  . FINGER SURGERY Right   .  FOOT FRACTURE SURGERY Right 06/2003   pinning, Dr. Francia [facility], [facility]  . FOOT SURGERY Left   . INCONTINENCE SURGERY    . NEUROMA SURGERY    . PACEMAKER INSERTION Right 10/02/2016   Procedure: INSERTION PACEMAKER;  Surgeon: Isaias Cowman, MD;  Location: [facility] ORS;  Service: Cardiovascular;  Laterality: Right;  . PARTIAL MASTECTOMY WITH AXILLARY SENTINEL LYMPH NODE BIOPSY Left 12/08/2015   Procedure: PARTIAL MASTECTOMY WITH AXILLARY SENTINEL LYMPH NODE BIOPSY;  Surgeon: Leonie Jerzey Komperda, MD;  Location: [facility] ORS;  Service: General;  Laterality: Left;    Social History   Socioeconomic History  . Marital status: Widowed    Spouse name: Not on file  . Number of children: 2  . Years of education: Not on file  . Highest education level: Not on file  Occupational History  . Not on file  Social Needs  . Financial resource strain: Not on file  . Food insecurity:    Worry: Not on file    Inability: Not on file  . Transportation needs:    Medical: Not on file    Non-medical: Not on file  Tobacco Use  . Smoking status: Never Smoker  . Smokeless tobacco: Never Used  Substance and Sexual Activity  . Alcohol use: No  . Drug  use: No  . Sexual activity: Never  Lifestyle  . Physical activity:    Days per week: Not on file    Minutes per session: Not on file  . Stress: Not on file  Relationships  . Social connections:    Talks on phone: Not on file    Gets together: Not on file    Attends religious service: Not on file    Active member of club or organization: Not on file    Attends meetings of clubs or organizations: Not on file    Relationship status: Not on file  . Intimate partner violence:    Fear of current or ex partner: Not on file    Emotionally abused: Not on file    Physically abused: Not on file    Forced sexual activity: Not on file  Other Topics Concern  . Not on file  Social History Narrative   Widowed   2 children   Never smoker   Denies alcohol use   Full  Code   Family History  Problem Relation Age of Onset  . Esophageal cancer Sister   . Kidney disease Neg Hx   . Bladder Cancer Neg Hx   . Breast cancer Neg Hx       VITAL SIGNS BP 121/77   Pulse 80   Temp 98.2 F (36.8 C)   Resp 13   Ht 5' (1.524 m)   Wt 117 lb 6.4 oz (53.3 kg)   SpO2 98%   BMI 22.93 kg/m   Outpatient Encounter Medications as of 01/02/2018  Medication Sig  . acetaminophen (TYLENOL) 325 MG tablet Take 650 mg by mouth 3 (three) times daily as needed.  . calcium carbonate (OSCAL) 1500 (600 Ca) MG TABS tablet Take 2 tablets by mouth daily.  . carbidopa-levodopa (SINEMET CR) 50-200 MG tablet Take 3 tablets by mouth 3 (three) times daily.  Marland Kitchen conjugated estrogens (PREMARIN) vaginal cream Apply 0.5 mg ( pea-sized amount) just inside the vaginal introitus with finger-tip weekly on Monday nights  . diltiazem (CARDIZEM) 120 MG tablet Take 120 mg by mouth daily.  . furosemide (LASIX) 40 MG tablet Take 40 mg by mouth daily.   . Hyprom-Naphaz-Polysorb-Zn Sulf (CLEAR EYES COMPLETE) SOLN Place 2 drops into both eyes every 2 (two) hours as needed.  . latanoprost (XALATAN) 0.005 % ophthalmic solution Place 1 drop into both eyes at bedtime. Per Dr. Wallace Going  . Lidocaine HCl (ASPERCREME W/LIDOCAINE) 4 % CREA Apply liberal amount topically  to shoulders/ neck or any other area of pain TID prn for pain/OA  . Melatonin 3 MG TABS Give 2 tablets (6 mg) by mouth at bedtime  . mirabegron ER (MYRBETRIQ) 25 MG TB24 tablet Take 25 mg by mouth daily.  . NON FORMULARY Diet Type: Regular diet  . ondansetron (ZOFRAN) 4 MG tablet Take 4 mg by mouth every 6 (six) hours as needed for nausea or vomiting.  . potassium chloride SA (K-DUR,KLOR-CON) 20 MEQ tablet Take 20 mEq by mouth daily.   . rivaroxaban (XARELTO) 20 MG TABS tablet Take 20 mg by mouth daily with supper.  Marland Kitchen rOPINIRole (REQUIP) 0.5 MG tablet Take 1 tablet (0.5 mg total) by mouth at bedtime.  . [DISCONTINUED] bisacodyl (DULCOLAX) 10  MG suppository Place 10 mg rectally daily as needed.  . [DISCONTINUED] cyanocobalamin (,VITAMIN B-12,) 1000 MCG/ML injection Inject 1,000 mcg into the skin every 30 (thirty) days. On 1st of the month  . [place] CREA Apply  liberal amount to area of skin irritation topically as needed. Ok to leave at bedside.  . [DISCONTINUED] sennosides-docusate sodium (SENOKOT-S) 8.6-50 MG tablet Take 1 tablet by mouth 2 (two) times daily as needed for constipation.   No facility-administered encounter medications on file as of 01/02/2018.      SIGNIFICANT DIAGNOSTIC EXAMS  TODAY:   11-10-17: diagnostic bilateral mammogram:No evidence of malignancy within either breast. Stable postsurgical changes within the LEFT breast.   12-09-17: bone density:  Spine: T score -3.0; hip -2.9    LABS REVIEWED: PREVIOUS   07-03-17: glucose 122; bun 15; creat 0.81; k+ 3.8; na++ 139; ca 9.6 08-04-17: wbc 6.9; hgb 12.6; hct 36.2; mcv 92.5; plt 311; glucose 83; bun 17; creat 0.63; k+ 3.7; na++ 141; ca 9.0 liver normal albumin 3.3 mag 2.3; tsh 1.983 vit B 12: 800; vit D 51.9  09-30-17: urine culture: multiple species 10-03-17: urine culture: <10,000 colonies   TODAY:   12-29-17: wbc 8.2; hgb 13.0; hct 39.9; mcv 94.3; plt 292; glucose 107; bun 20; creat 0.81; k+ 3.8; na++ 134; ca 9.8; liver normal albumin 3.9   Review of Systems  Constitutional: Negative for malaise/fatigue.  Respiratory: Negative for cough and shortness of breath.   Cardiovascular: Negative for chest pain, palpitations and leg swelling.  Gastrointestinal: Negative for abdominal pain, constipation and heartburn.  Musculoskeletal: Negative for back pain, joint pain and myalgias.  Skin: Negative.   Neurological: Negative for dizziness.  Psychiatric/Behavioral: The patient is not nervous/anxious.      Physical Exam  Constitutional: She is oriented to person, place, and time. No distress.  Thin   Neck: No thyromegaly  present.  Cardiovascular: Normal rate, normal heart sounds and intact distal pulses.  Heart rate irregular   Pulmonary/Chest: Effort normal and breath sounds normal. No respiratory distress.  Abdominal: Soft. Bowel sounds are normal. She exhibits no distension. There is no tenderness.  Musculoskeletal: She exhibits no edema.  Able to move all extremities Kyphosis Uses walker     Lymphadenopathy:    She has no cervical adenopathy.  Neurological: She is alert and oriented to person, place, and time.  Skin: Skin is warm and dry. She is not diaphoretic.  Psychiatric: She has a normal mood and affect.     ASSESSMENT/PLAN  TODAY:   1. Paroxysmal atrial fibrillation/has sick sinus snydomre: status post pacemaker  heart rate stable; will continue cardizem 120 mg daily for rate control is taking xarelto 20 mg daily   2. DVT of distal vein of right lower extremity; PE: is stable is on long xarelto 20 mg daily   3. Chronic diastolic heart failure: is stable will continue lasix 40 mg daily with k+ 20 meq daily   PREVIOUS  4. Hypertensive heart disease with heart failure: is stable b/p 121/77: will continue cardizem 120 mg daily   5. Parkinson disease: is stable will continue sinemet 50/200 mg ( 3 tabs)three times daily and requip 0.5 mg nightly   6. Overactive bladder: has post-menopausal atrophic vaginitis: is stable will continue myrbetric 25 mg daily and premarin vaginal cream weekly   7,  bilateral glaucoma: is stable will continue xalatan to both eyes nightly   8. Chronic constipation: is stable will continue senna s twice daily and twice daily as needed   9. Osteoporosis without current pathological fracture: is without change: is status post 5 years Zometa therapy; is on prolia last given 07-03-17; is due for injection with oncology.   10. Left breast stage I  cancer: is status post partial left mastectomy (12-08-15); is status post tamoxifen therapy was stopped due to blood clots. Is  followed by oncology.    MD is aware of resident's narcotic use and is in agreement with current plan of care. We will attempt to wean resident as apropriate   Ok Edwards NP [facility]  Contact [phone] Monday through Friday 8am- 5pm  After hours call [phone]

## 2018-01-28 ENCOUNTER — Encounter
Admission: RE | Admit: 2018-01-28 | Discharge: 2018-01-28 | Disposition: A | Discharge status: Home / Self Care | Payer: Medicaid Other | Source: Ambulatory Visit | Attending: Internal Medicine | Admitting: Internal Medicine

## 2018-01-30 ENCOUNTER — Other Ambulatory Visit
Admission: RE | Admit: 2018-01-30 | Discharge: 2018-01-30 | Disposition: A | Discharge status: Home / Self Care | Payer: Medicare Other | Source: Skilled Nursing Facility | Attending: Adult Health | Admitting: Adult Health

## 2018-01-30 DIAGNOSIS — R109 Unspecified abdominal pain: Secondary | ICD-10-CM | POA: Diagnosis present

## 2018-01-30 LAB — URINALYSIS, ROUTINE W REFLEX MICROSCOPIC
Bilirubin Urine: NEGATIVE
Glucose, UA: NEGATIVE mg/dL
Hgb urine dipstick: NEGATIVE
Ketones, ur: 5 mg/dL — AB
LEUKOCYTES UA: NEGATIVE
Nitrite: NEGATIVE
Protein, ur: NEGATIVE mg/dL
Specific Gravity, Urine: 1.014 (ref 1.005–1.030)
pH: 5 (ref 5.0–8.0)

## 2018-02-01 LAB — URINE CULTURE

## 2018-02-03 ENCOUNTER — Encounter: Payer: Self-pay | Admitting: Adult Health

## 2018-02-03 ENCOUNTER — Non-Acute Institutional Stay (SKILLED_NURSING_FACILITY): Payer: Medicare Other | Admitting: Adult Health

## 2018-02-03 DIAGNOSIS — N3281 Overactive bladder: Secondary | ICD-10-CM

## 2018-02-03 DIAGNOSIS — I110 Hypertensive heart disease with heart failure: Secondary | ICD-10-CM

## 2018-02-03 DIAGNOSIS — G20 Parkinson's disease: Secondary | ICD-10-CM

## 2018-02-03 DIAGNOSIS — H4089 Other specified glaucoma: Secondary | ICD-10-CM

## 2018-02-03 NOTE — Progress Notes (Signed)
[facility]   [facility] Room Number: [place] of Service:  SNF (31)   CODE STATUS: DNR  No Known Allergies  Chief Complaint  Patient presents with  . Medical Management of Chronic Issues    Hypertensive heart disease with heart failure; parkinson disease; overactive bladder; other glaucoma of both eyes.     HPI:  She is a 85 year old long term resident of this facility being seen for the management of her chronic illnesses: hypertensive heart disease; parkinson disease; overactive bladder; glaucoma. She denies any uncontrolled pain; no changes in her UI; no changes in appetite; no anxiety or insomnia.   Past Medical History:  Diagnosis Date  . A-fib ([place])    unspecified  . Anemia    unspecified  . Arthritis    osteoarthritis  . Bleeding hemorrhoid   . Breast cancer ([place])    unspecified  . Cancer ([place])    Breast  . Cataract   . Diverticulosis   . DVT (deep vein thrombosis) in pregnancy   . Hiatal hernia   . Hypertension   . Incontinence   . Osteoarthritis   . Osteoporosis    a. Intolerance to Fosamax b. Bilateral foot fractures c. IV Boniva d. Low vitamin D e. Reclast  . Parkinson disease ([place])   . PE (pulmonary thromboembolism) ([place])   . Post-menopausal atrophic vaginitis 01/28/2017  . Scleritis    Idiopathic a. Prednisone b. s/p methotrexate  . TIA (transient ischemic attack) 1997   Possible  . Urinary urgency   . Vitamin B12 deficiency     Past Surgical History:  Procedure Laterality Date  . ABDOMINAL HYSTERECTOMY    . ABDOMINAL HYSTERECTOMY     partial  . BLADDER TACK SURGERY     Dr. Ouida Sills  . BREAST BIOPSY Left 10/2012   benign  . BREAST BIOPSY Left 11/22/2015   invasive mammary ca   . BREAST EXCISIONAL BIOPSY Left 12/08/2015   lumpectomy  . BREAST LUMPECTOMY Left 11/2015   Invasive mammary carcinoma Grade I  . EYE SURGERY Bilateral    Catarct Extraction with IOL  . FINGER SURGERY Right   . FOOT FRACTURE  SURGERY Right 06/2003   pinning, Dr. Francia Greaves, [facility]  . FOOT SURGERY Left   . INCONTINENCE SURGERY    . NEUROMA SURGERY    . PACEMAKER INSERTION Right 10/02/2016   Procedure: INSERTION PACEMAKER;  Surgeon: Isaias Cowman, MD;  Location: [facility] ORS;  Service: Cardiovascular;  Laterality: Right;  . PARTIAL MASTECTOMY WITH AXILLARY SENTINEL LYMPH NODE BIOPSY Left 12/08/2015   Procedure: PARTIAL MASTECTOMY WITH AXILLARY SENTINEL LYMPH NODE BIOPSY;  Surgeon: Leonie Bodey Frizell, MD;  Location: [facility] ORS;  Service: General;  Laterality: Left;    Social History   Socioeconomic History  . Marital status: Widowed    Spouse name: Not on file  . Number of children: 2  . Years of education: Not on file  . Highest education level: Not on file  Occupational History  . Not on file  Social Needs  . Financial resource strain: Not on file  . Food insecurity:    Worry: Not on file    Inability: Not on file  . Transportation needs:    Medical: Not on file    Non-medical: Not on file  Tobacco Use  . Smoking status: Never Smoker  . Smokeless tobacco: Never Used  Substance and Sexual Activity  . Alcohol use: No  . Drug use: No  .  Sexual activity: Never  Lifestyle  . Physical activity:    Days per week: Not on file    Minutes per session: Not on file  . Stress: Not on file  Relationships  . Social connections:    Talks on phone: Not on file    Gets together: Not on file    Attends religious service: Not on file    Active member of club or organization: Not on file    Attends meetings of clubs or organizations: Not on file    Relationship status: Not on file  . Intimate partner violence:    Fear of current or ex partner: Not on file    Emotionally abused: Not on file    Physically abused: Not on file    Forced sexual activity: Not on file  Other Topics Concern  . Not on file  Social History Narrative   Widowed   2 children   Never smoker   Denies alcohol use   Full Code   Family  History  Problem Relation Age of Onset  . Esophageal cancer Sister   . Kidney disease Neg Hx   . Bladder Cancer Neg Hx   . Breast cancer Neg Hx       VITAL SIGNS BP 119/75   Pulse 90   Temp 98.2 F (36.8 C)   Resp 12   Ht 5' (1.524 m)   Wt 118 lb 4.8 oz (53.7 kg)   SpO2 97%   BMI 23.10 kg/m   Outpatient Encounter Medications as of 02/03/2018  Medication Sig  . acetaminophen (TYLENOL) 325 MG tablet Take 650 mg by mouth 3 (three) times daily as needed.  . calcium carbonate (OSCAL) 1500 (600 Ca) MG TABS tablet Take 2 tablets by mouth daily.  . carbidopa-levodopa (SINEMET CR) 50-200 MG tablet Take 3 tablets by mouth 3 (three) times daily.  Marland Kitchen conjugated estrogens (PREMARIN) vaginal cream Apply 0.5 mg ( pea-sized amount) just inside the vaginal introitus with finger-tip weekly on Monday nights  . Cyanocobalamin (VITAMIN B-12 IJ) Inject 1 mL into the muscle every 30 (thirty) days. Once a day on the 1st of the month  . diltiazem (CARDIZEM) 120 MG tablet Take 120 mg by mouth daily.  . furosemide (LASIX) 40 MG tablet Take 40 mg by mouth daily.   . Hyprom-Naphaz-Polysorb-Zn Sulf (CLEAR EYES COMPLETE) SOLN Place 2 drops into both eyes every 2 (two) hours as needed.  . latanoprost (XALATAN) 0.005 % ophthalmic solution Place 1 drop into both eyes at bedtime. Per Dr. Wallace Going  . Lidocaine HCl (ASPERCREME W/LIDOCAINE) 4 % CREA Apply liberal amount topically  to shoulders/ neck or any other area of pain TID prn for pain/OA  . Melatonin 3 MG TABS Give 2 tablets (6 mg) by mouth at bedtime  . mirabegron ER (MYRBETRIQ) 25 MG TB24 tablet Take 25 mg by mouth daily.  . NON FORMULARY Diet Type: Regular diet  . ondansetron (ZOFRAN) 4 MG tablet Take 4 mg by mouth every 6 (six) hours as needed for nausea or vomiting.  . potassium chloride SA (K-DUR,KLOR-CON) 20 MEQ tablet Take 20 mEq by mouth daily.   . rivaroxaban (XARELTO) 20 MG TABS tablet Take 20 mg by mouth daily with supper.  Marland Kitchen rOPINIRole (REQUIP)  0.5 MG tablet Take 1 tablet (0.5 mg total) by mouth at bedtime.   No facility-administered encounter medications on file as of 02/03/2018.      SIGNIFICANT DIAGNOSTIC EXAMS  PREVIOUS:   11-10-17: diagnostic bilateral mammogram:No  evidence of malignancy within either breast. Stable postsurgical changes within the LEFT breast.   12-09-17: bone density:  Spine: T score -3.0; hip -2.9  NO NEW EXAMS.   LABS REVIEWED: PREVIOUS   07-03-17: glucose 122; bun 15; creat 0.81; k+ 3.8; na++ 139; ca 9.6 08-04-17: wbc 6.9; hgb 12.6; hct 36.2; mcv 92.5; plt 311; glucose 83; bun 17; creat 0.63; k+ 3.7; na++ 141; ca 9.0 liver normal albumin 3.3 mag 2.3; tsh 1.983 vit B 12: 800; vit D 51.9  09-30-17: urine culture: multiple species 10-03-17: urine culture: <10,000 colonies  12-29-17: wbc 8.2; hgb 13.0; hct 39.9; mcv 94.3; plt 292; glucose 107; bun 20; creat 0.81; k+ 3.8; na++ 134; ca 9.8; liver normal albumin 3.9   TODAY:   01-30-18: urine culture: multiple species   Review of Systems  Constitutional: Negative for malaise/fatigue.  Respiratory: Negative for cough and shortness of breath.   Cardiovascular: Negative for chest pain, palpitations and leg swelling.  Gastrointestinal: Negative for abdominal pain, constipation and heartburn.  Musculoskeletal: Negative for back pain, joint pain and myalgias.  Skin: Negative.   Neurological: Negative for dizziness.  Psychiatric/Behavioral: The patient is not nervous/anxious.     Physical Exam Constitutional:      General: She is not in acute distress.    Appearance: She is well-developed. She is not diaphoretic.     Comments: thin  Neck:     Musculoskeletal: Neck supple.     Thyroid: No thyromegaly.  Cardiovascular:     Rate and Rhythm: Normal rate. Rhythm irregular.     Pulses: Normal pulses.     Heart sounds: Normal heart sounds.  Pulmonary:     Effort: Pulmonary effort is normal. No respiratory distress.     Breath sounds: Normal breath sounds.    Abdominal:     General: Bowel sounds are normal. There is no distension.     Palpations: Abdomen is soft.     Tenderness: There is no abdominal tenderness.  Musculoskeletal:     Right lower leg: No edema.     Left lower leg: No edema.     Comments: Able to move all extremities Kyphosis Uses walker     Lymphadenopathy:     Cervical: No cervical adenopathy.  Skin:    General: Skin is warm and dry.     Comments: History of partial left mastectomy   Neurological:     Mental Status: She is alert and oriented to person, place, and time.     ASSESSMENT/PLAN  TODAY:   1. Hypertensive heart disease with heart failure: is stable b/p 121/77: will continue cardizem 120 mg daily   2. Parkinson disease: is stable will continue sinemet 50/200 mg ( 3 tabs)three times daily and requip 0.5 mg nightly   3. Overactive bladder: has post-menopausal atrophic vaginitis: is stable will continue myrbetric 25 mg daily and premarin vaginal cream weekly   4,  bilateral glaucoma: is status post bilateral cataract surgery is stable will continue xalatan to both eyes nightly    PREVIOUS  5. Chronic constipation: is stable will continue senna s twice daily and twice daily as needed   6. Osteoporosis without current pathological fracture: is without change: is status post 5 years Zometa therapy; is on prolia last given 07-03-17; prolia held due to renal function on 02-03-18.   7. Left breast stage I cancer: is status post partial left mastectomy (12-08-15); is status post tamoxifen therapy was stopped due to blood clots. Radiation therapy was deferred  due to advanced age. Is followed by oncology.   8. Paroxysmal atrial fibrillation/has sick sinus snydomre: status post pacemaker  heart rate stable; will continue cardizem 120 mg daily for rate control is taking xarelto 20 mg daily   9. DVT of distal vein of right lower extremity; PE: is stable is on long xarelto 20 mg daily   10. Chronic diastolic heart  failure: is stable will continue lasix 40 mg daily with k+ 20 meq daily   11. Vit B 12 Deficiency: is stable will continue monthly injections     MD is aware of resident's narcotic use and is in agreement with current plan of care. We will attempt to wean resident as apropriate   Ok Edwards NP [facility]  Contact [phone] Monday through Friday 8am- 5pm  After hours call [phone]

## 2018-02-09 ENCOUNTER — Ambulatory Visit: Payer: Medicare Other | Admitting: Urology

## 2018-02-10 ENCOUNTER — Non-Acute Institutional Stay (SKILLED_NURSING_FACILITY): Payer: Medicare Other | Admitting: Adult Health

## 2018-02-10 ENCOUNTER — Encounter: Payer: Self-pay | Admitting: Adult Health

## 2018-02-10 DIAGNOSIS — M545 Low back pain: Secondary | ICD-10-CM

## 2018-02-10 DIAGNOSIS — G8929 Other chronic pain: Secondary | ICD-10-CM

## 2018-02-10 NOTE — Progress Notes (Signed)
[facility]:   [facility] Room Number: [place] of Service:  SNF (31)   CODE STATUS: DNR  No Known Allergies  Chief Complaint  Patient presents with  . Acute Visit    Back Pain    HPI:  She has been having low back pain. There was concern for an UTI for which the culture was negative. She still has back pain; without radiation to her legs. Her pain is achy in nature. She states that tylenol is effective for pain management.   Past Medical History:  Diagnosis Date  . A-fib ([place])    unspecified  . Anemia    unspecified  . Arthritis    osteoarthritis  . Bleeding hemorrhoid   . Breast cancer ([place])    unspecified  . Cancer ([place])    Breast  . Cataract   . Diverticulosis   . DVT (deep vein thrombosis) in pregnancy   . Hiatal hernia   . Hypertension   . Incontinence   . Osteoarthritis   . Osteoporosis    a. Intolerance to Fosamax b. Bilateral foot fractures c. IV Boniva d. Low vitamin D e. Reclast  . Parkinson disease ([place])   . PE (pulmonary thromboembolism) ([place])   . Post-menopausal atrophic vaginitis 01/28/2017  . Scleritis    Idiopathic a. Prednisone b. s/p methotrexate  . TIA (transient ischemic attack) 1997   Possible  . Urinary urgency   . Vitamin B12 deficiency     Past Surgical History:  Procedure Laterality Date  . ABDOMINAL HYSTERECTOMY    . ABDOMINAL HYSTERECTOMY     partial  . BLADDER TACK SURGERY     Dr. Ouida Sills  . BREAST BIOPSY Left 10/2012   benign  . BREAST BIOPSY Left 11/22/2015   invasive mammary ca   . BREAST EXCISIONAL BIOPSY Left 12/08/2015   lumpectomy  . BREAST LUMPECTOMY Left 11/2015   Invasive mammary carcinoma Grade I  . EYE SURGERY Bilateral    Catarct Extraction with IOL  . FINGER SURGERY Right   . FOOT FRACTURE SURGERY Right 06/2003   pinning, Dr. Francia Greaves, [facility]  . FOOT SURGERY Left   . INCONTINENCE SURGERY    . NEUROMA SURGERY    . PACEMAKER INSERTION Right 10/02/2016   Procedure: INSERTION  PACEMAKER;  Surgeon: Isaias Cowman, MD;  Location: [facility] ORS;  Service: Cardiovascular;  Laterality: Right;  . PARTIAL MASTECTOMY WITH AXILLARY SENTINEL LYMPH NODE BIOPSY Left 12/08/2015   Procedure: PARTIAL MASTECTOMY WITH AXILLARY SENTINEL LYMPH NODE BIOPSY;  Surgeon: [name], MD;  Location: [facility] ORS;  Service: General;  Laterality: Left;    Social History   Socioeconomic History  . Marital status: Widowed    Spouse name: Not on file  . Number of children: 2  . Years of education: Not on file  . Highest education level: Not on file  Occupational History  . Not on file  Social Needs  . Financial resource strain: Not on file  . Food insecurity:    Worry: Not on file    Inability: Not on file  . Transportation needs:    Medical: Not on file    Non-medical: Not on file  Tobacco Use  . Smoking status: Never Smoker  . Smokeless tobacco: Never Used  Substance and Sexual Activity  . Alcohol use: No  . Drug use: No  . Sexual activity: Never  Lifestyle  . Physical activity:    Days per week: Not on file  Minutes per session: Not on file  . Stress: Not on file  Relationships  . Social connections:    Talks on phone: Not on file    Gets together: Not on file    Attends religious service: Not on file    Active member of club or organization: Not on file    Attends meetings of clubs or organizations: Not on file    Relationship status: Not on file  . Intimate partner violence:    Fear of current or ex partner: Not on file    Emotionally abused: Not on file    Physically abused: Not on file    Forced sexual activity: Not on file  Other Topics Concern  . Not on file  Social History Narrative   Widowed   2 children   Never smoker   Denies alcohol use   Full Code   Family History  Problem Relation Age of Onset  . Esophageal cancer Sister   . Kidney disease Neg Hx   . Bladder Cancer Neg Hx   . Breast cancer Neg Hx       VITAL SIGNS BP 120/78    Pulse 89   Temp 98.3 F (36.8 C)   Resp 13   Ht 5' (1.524 m)   Wt 119 lb 8 oz (54.2 kg)   SpO2 100%   BMI 23.34 kg/m   Outpatient Encounter Medications as of 02/10/2018  Medication Sig  . acetaminophen (TYLENOL) 325 MG tablet Take 650 mg by mouth 3 (three) times daily as needed.  . calcium carbonate (OSCAL) 1500 (600 Ca) MG TABS tablet Take 2 tablets by mouth daily.  . carbidopa-levodopa (SINEMET CR) 50-200 MG tablet Take 3 tablets by mouth 3 (three) times daily.  Marland Kitchen conjugated estrogens (PREMARIN) vaginal cream Apply 0.5 mg ( pea-sized amount) just inside the vaginal introitus with finger-tip weekly on Monday nights  . Cyanocobalamin (VITAMIN B-12 IJ) Inject 1 mL into the muscle every 30 (thirty) days. Once a day on the 1st of the month  . diltiazem (CARDIZEM) 120 MG tablet Take 120 mg by mouth daily.  . furosemide (LASIX) 40 MG tablet Take 40 mg by mouth daily.   . Hyprom-Naphaz-Polysorb-Zn Sulf (CLEAR EYES COMPLETE) SOLN Place 2 drops into both eyes every 2 (two) hours as needed.  . latanoprost (XALATAN) 0.005 % ophthalmic solution Place 1 drop into both eyes at bedtime. Per Dr. Wallace Going  . Lidocaine HCl (ASPERCREME W/LIDOCAINE) 4 % CREA Apply liberal amount topically  to shoulders/ neck or any other area of pain TID prn for pain/OA  . Melatonin 3 MG TABS Give 2 tablets (6 mg) by mouth at bedtime  . mirabegron ER (MYRBETRIQ) 25 MG TB24 tablet Take 25 mg by mouth daily.  . NON FORMULARY Diet Type: Regular diet  . ondansetron (ZOFRAN) 4 MG tablet Take 4 mg by mouth every 6 (six) hours as needed for nausea or vomiting.  . potassium chloride SA (K-DUR,KLOR-CON) 20 MEQ tablet Take 20 mEq by mouth daily.   . rivaroxaban (XARELTO) 20 MG TABS tablet Take 20 mg by mouth daily with supper.  Marland Kitchen rOPINIRole (REQUIP) 0.5 MG tablet Take 1 tablet (0.5 mg total) by mouth at bedtime.   No facility-administered encounter medications on file as of 02/10/2018.      SIGNIFICANT DIAGNOSTIC  EXAMS  PREVIOUS:   11-10-17: diagnostic bilateral mammogram:No evidence of malignancy within either breast. Stable postsurgical changes within the LEFT breast.   12-09-17: bone density:  Spine: T  score -3.0; hip -2.9  NO NEW EXAMS.   LABS REVIEWED: PREVIOUS   07-03-17: glucose 122; bun 15; creat 0.81; k+ 3.8; na++ 139; ca 9.6 08-04-17: wbc 6.9; hgb 12.6; hct 36.2; mcv 92.5; plt 311; glucose 83; bun 17; creat 0.63; k+ 3.7; na++ 141; ca 9.0 liver normal albumin 3.3 mag 2.3; tsh 1.983 vit B 12: 800; vit D 51.9  09-30-17: urine culture: multiple species 10-03-17: urine culture: <10,000 colonies  12-29-17: wbc 8.2; hgb 13.0; hct 39.9; mcv 94.3; plt 292; glucose 107; bun 20; creat 0.81; k+ 3.8; na++ 134; ca 9.8; liver normal albumin 3.9  01-30-18: urine culture: multiple species   NO NEW LABS.     Review of Systems  Constitutional: Negative for malaise/fatigue.  Respiratory: Negative for cough and shortness of breath.   Cardiovascular: Negative for chest pain, palpitations and leg swelling.  Gastrointestinal: Negative for abdominal pain, constipation and heartburn.  Musculoskeletal: Positive for back pain. Negative for joint pain and myalgias.  Skin: Negative.   Neurological: Negative for dizziness.  Psychiatric/Behavioral: The patient is not nervous/anxious.     Physical Exam Constitutional:      General: She is not in acute distress.    Appearance: She is well-developed. She is not diaphoretic.     Comments: thin  Neck:     Musculoskeletal: Neck supple.     Thyroid: No thyromegaly.  Cardiovascular:     Rate and Rhythm: Normal rate. Rhythm irregular.     Pulses: Normal pulses.     Heart sounds: Normal heart sounds.  Pulmonary:     Effort: Pulmonary effort is normal. No respiratory distress.     Breath sounds: Normal breath sounds.  Abdominal:     General: Bowel sounds are normal. There is no distension.     Palpations: Abdomen is soft.     Tenderness: There is no abdominal  tenderness.  Musculoskeletal:     Right lower leg: No edema.     Left lower leg: No edema.     Comments: Able to move all extremities Kyphosis Uses walker   Has mild tenderness to her lower  back to palpation   Lymphadenopathy:     Cervical: No cervical adenopathy.  Skin:    General: Skin is warm and dry.     Comments: History of partial left mastectomy    Neurological:     Mental Status: She is alert. Mental status is at baseline.  Psychiatric:        Mood and Affect: Mood normal.       ASSESSMENT/PLAN  TODAY:   1.  Chronic bilateral low back pain without sciatica: is worse: will begin tylenol 650 mg three times daily on a routine basis and will monitor    MD is aware of resident's narcotic use and is in agreement with current plan of care. We will attempt to wean resident as apropriate   Ok Edwards NP [facility]  Contact [phone] Monday through Friday 8am- 5pm  After hours call [phone]

## 2018-02-14 DIAGNOSIS — M545 Low back pain: Secondary | ICD-10-CM

## 2018-02-14 DIAGNOSIS — G8929 Other chronic pain: Secondary | ICD-10-CM | POA: Insufficient documentation

## 2018-02-28 ENCOUNTER — Encounter
Admission: RE | Admit: 2018-02-28 | Discharge: 2018-02-28 | Disposition: A | Discharge status: Home / Self Care | Payer: Medicaid Other | Source: Ambulatory Visit | Attending: Internal Medicine | Admitting: Internal Medicine

## 2018-03-04 ENCOUNTER — Ambulatory Visit: Payer: Medicare Other | Admitting: Urology

## 2018-03-04 ENCOUNTER — Ambulatory Visit (INDEPENDENT_AMBULATORY_CARE_PROVIDER_SITE_OTHER): Payer: Commercial Managed Care - HMO | Admitting: Urology

## 2018-03-04 ENCOUNTER — Encounter: Payer: Self-pay | Admitting: Urology

## 2018-03-04 VITALS — BP 121/74 | HR 85 | Ht 60.0 in | Wt 120.0 lb

## 2018-03-04 DIAGNOSIS — R32 Unspecified urinary incontinence: Secondary | ICD-10-CM | POA: Diagnosis not present

## 2018-03-04 DIAGNOSIS — R300 Dysuria: Secondary | ICD-10-CM

## 2018-03-04 LAB — MICROSCOPIC EXAMINATION
RBC, UA: NONE SEEN /hpf (ref 0–2)
Renal Epithel, UA: NONE SEEN /hpf

## 2018-03-04 LAB — URINALYSIS, COMPLETE
Bilirubin, UA: NEGATIVE
Glucose, UA: NEGATIVE
Ketones, UA: NEGATIVE
Nitrite, UA: NEGATIVE
PH UA: 5.5 (ref 5.0–7.5)
PROTEIN UA: NEGATIVE
RBC, UA: NEGATIVE
Specific Gravity, UA: 1.02 (ref 1.005–1.030)
Urobilinogen, Ur: 0.2 mg/dL (ref 0.2–1.0)

## 2018-03-04 NOTE — Progress Notes (Signed)
In and Out Catheterization  Patient is present today for a I & O catheterization due to painful urination. Patient was cleaned and prepped in a sterile fashion with betadine and Lidocaine 2% jelly was instilled into the urethra.  A 14FR cath was inserted no complications were noted , 139ml of urine return was noted, urine was yellow in color. A clean urine sample was collected for UA/CX. Bladder was drained  And catheter was removed with out difficulty.    Preformed by: Shawnie Dapper, CMA

## 2018-03-04 NOTE — Progress Notes (Incomplete)
03/04/2018  8:41 AM   Laura Cisneros 1925-03-05 [id number]  Referring provider: Leonel Ramsay, MD [address], [place] [address]  No chief complaint on file.   HPI: Patient is an 85 year old Caucasian female who presents today for a one year follow up for incontinence, vaginal atrophy and a history of recurrent UTI.      Mixed incontinence The patient has been experiencing  is engaging in toilet mapping, incontinence x 0-3 and nocturia x 0-3. Her PVR is 83 mL. Previous PVR was 83 mL. Using 2 pads daily.  She contributing most of the leakage to the staff's inability to get to her to help her to the restroom.    Vaginal atrophy She is applying the cream once weekly.    History of recurrent UTI's Her symptoms with a urinary tract infection consist of frequency, dysuria, urgency, low back pain and incontinence.  She denies gross hematuria, suprapubic pain, abdominal pain or flank pain.  She has not had any recent fevers, chills, nausea or vomiting.   She does not have a history of nephrolithiasis, GU surgery or GU trauma.  She has not had any UTI's since her visit with [place] last year.    PMH: Past Medical History:  Diagnosis Date   A-fib ([place])    unspecified   Anemia    unspecified   Arthritis    osteoarthritis   Bleeding hemorrhoid    Breast cancer ([place])    unspecified   Cancer ([place])    Breast   Cataract    Diverticulosis    DVT (deep vein thrombosis) in pregnancy    Hiatal hernia    Hypertension    Incontinence    Osteoarthritis    Osteoporosis    a. Intolerance to Fosamax b. Bilateral foot fractures c. IV Boniva d. Low vitamin D e. Reclast   Parkinson disease ([place])    PE (pulmonary thromboembolism) ([place])    Post-menopausal atrophic vaginitis 01/28/2017   Scleritis    Idiopathic a. Prednisone b. s/p methotrexate   TIA (transient ischemic attack) 1997   Possible   Urinary urgency    Vitamin B12 deficiency      Surgical History: Past Surgical History:  Procedure Laterality Date   ABDOMINAL HYSTERECTOMY     ABDOMINAL HYSTERECTOMY     partial   BLADDER TACK SURGERY     Dr. Ouida Sills   BREAST BIOPSY Left 10/2012   benign   BREAST BIOPSY Left 11/22/2015   invasive mammary ca    BREAST EXCISIONAL BIOPSY Left 12/08/2015   lumpectomy   BREAST LUMPECTOMY Left 11/2015   Invasive mammary carcinoma Grade I   EYE SURGERY Bilateral    Catarct Extraction with IOL   FINGER SURGERY Right    FOOT FRACTURE SURGERY Right 06/2003   pinning, Dr. Francia Greaves, [facility]   FOOT SURGERY Left    INCONTINENCE SURGERY     NEUROMA SURGERY     PACEMAKER INSERTION Right 10/02/2016   Procedure: INSERTION PACEMAKER;  Surgeon: Isaias Cowman, MD;  Location: [facility] ORS;  Service: Cardiovascular;  Laterality: Right;   PARTIAL MASTECTOMY WITH AXILLARY SENTINEL LYMPH NODE BIOPSY Left 12/08/2015   Procedure: PARTIAL MASTECTOMY WITH AXILLARY SENTINEL LYMPH NODE BIOPSY;  Surgeon: [name], MD;  Location: [facility] ORS;  Service: General;  Laterality: Left;    Home Medications:  Allergies as of 03/04/2018   No Known Allergies     Medication List  Accurate as of March 04, 2018  8:41 AM. Always use your most recent med list.        acetaminophen 325 MG tablet Commonly known as:  TYLENOL Take 650 mg by mouth 3 (three) times daily as needed.   ASPERCREME W/LIDOCAINE 4 % Crea Generic drug:  Lidocaine HCl Apply liberal amount topically  to shoulders/ neck or any other area of pain TID prn for pain/OA   calcium carbonate 1500 (600 Ca) MG Tabs tablet Commonly known as:  OSCAL Take 2 tablets by mouth daily.   carbidopa-levodopa 50-200 MG tablet Commonly known as:  SINEMET CR Take 3 tablets by mouth 3 (three) times daily.   CLEAR EYES COMPLETE Soln Place 2 drops into both eyes every 2 (two) hours as needed.   diltiazem 120 MG tablet Commonly known as:  CARDIZEM Take 120 mg by mouth  daily.   furosemide 40 MG tablet Commonly known as:  LASIX Take 40 mg by mouth daily.   latanoprost 0.005 % ophthalmic solution Commonly known as:  XALATAN Place 1 drop into both eyes at bedtime. Per Dr. Wallace Going   Melatonin 3 MG Tabs Give 2 tablets (6 mg) by mouth at bedtime   mirabegron ER 25 MG Tb24 tablet Commonly known as:  MYRBETRIQ Take 25 mg by mouth daily.   NON FORMULARY Diet Type: Regular diet   ondansetron 4 MG tablet Commonly known as:  ZOFRAN Take 4 mg by mouth every 6 (six) hours as needed for nausea or vomiting.   potassium chloride SA 20 MEQ tablet Commonly known as:  K-DUR,KLOR-CON Take 20 mEq by mouth daily.   PREMARIN vaginal cream Generic drug:  conjugated estrogens Apply 0.5 mg ( pea-sized amount) just inside the vaginal introitus with finger-tip weekly on Monday nights   rivaroxaban 20 MG Tabs tablet Commonly known as:  XARELTO Take 20 mg by mouth daily with supper.   rOPINIRole 0.5 MG tablet Commonly known as:  REQUIP Take 1 tablet (0.5 mg total) by mouth at bedtime.   VITAMIN B-12 IJ Inject 1 mL into the muscle every 30 (thirty) days. Once a day on the 1st of the month       Allergies: No Known Allergies  Family History: Family History  Problem Relation Age of Onset   Esophageal cancer Sister    Kidney disease Neg Hx    Bladder Cancer Neg Hx    Breast cancer Neg Hx     Social History:  reports that she has never smoked. She has never used smokeless tobacco. She reports that she does not drink alcohol or use drugs.  ROS:                                        Physical Exam: There were no vitals taken for this visit.  Constitutional:  Well nourished. Alert and oriented, No acute distress. HEENT: [place] AT, moist mucus membranes.  Trachea midline, no masses. Cardiovascular: No clubbing, cyanosis, or edema. Respiratory: Normal respiratory effort, no increased work of breathing. GI: Abdomen is soft, non  tender, non distended, no abdominal masses. Liver and spleen not palpable.  No hernias appreciated.  Stool sample for occult testing is not indicated.   GU: No CVA tenderness.  No bladder fullness or masses.  *** external genitalia, *** pubic hair distribution, no lesions.  Normal urethral meatus, no lesions, no prolapse, no discharge.   No  urethral masses, tenderness and/or tenderness. No bladder fullness, tenderness or masses. *** vagina mucosa, *** estrogen effect, no discharge, no lesions, *** pelvic support, *** cystocele and *** rectocele noted.  No cervical motion tenderness.  Uterus is freely mobile and non-fixed.  No adnexal/parametria masses or tenderness noted.  Anus and perineum are without rashes or lesions.   ***  Skin: No rashes, bruises or suspicious lesions. Lymph: No cervical or inguinal adenopathy. Neurologic: Grossly intact, no focal deficits, moving all 4 extremities. Psychiatric: Normal mood and affect.    Laboratory Data:   Pertinent Imaging:  Assessment & Plan:    1. Recurrent UTI's  - reviewed UTI prevention  - remind patient to contact our office when she has symptoms of an UTI  2. Incontinence  - PVR was 83 mL  - continue estrogen cream; once weekly  - continue Myrbetriq 25 mg daily  - present to clinic in 12 months for PVR and symptom recheck   3. Vaginal atrophy  - continue applying the cream once weekly  - RTC in 12 months for an exam  No follow-ups on file.  These notes generated with voice recognition software. I apologize for typographical errors.  [facility] [address] [place] [address] [place], [place] [address] [phone]  I, Lucas Mallow, am acting as a Education administrator for [employer],  {Add [employer] Statement}

## 2018-03-04 NOTE — Progress Notes (Signed)
03/04/2018 3:46 PM   Laura Cisneros 1925/08/07 [id number]  Referring provider: Leonel Ramsay, MD [place], [place] [address]  Chief Complaint  Patient presents with  . Other    HPI: Patient is an 85 year old Caucasian female who presents today for a one year follow up with her son, Laura Cisneros for incontinence, vaginal atrophy and a history of recurrent UTI.      Mixed incontinence On 02/11/2017, the patient has been engaging in toilet mapping, incontinence x 0-3 and nocturia x 0-3. Her previous pvr is 83 mL.   Today pt was unable to fill out the OAB questionnaire.   Vaginal atrophy She is applying the cream once weekly. She still has her appendix.   History of recurrent UTI's Her symptoms with a urinary tract infection consist of frequency, dysuria, urgency, low back pain and incontinence.  She denies gross hematuria, suprapubic pain, abdominal pain or flank pain.  She has not had any recent fevers, chills, nausea or vomiting.   She does not have a history of nephrolithiasis, GU surgery or GU trauma.  She has not had any UTI's since her visit with [place] last year.   She reports of a burning sensation while urinating which is not bothersome. Her Cath UA today showed 11-30 WBC and moderate bacteria.    PMH: Past Medical History:  Diagnosis Date  . A-fib ([place])    unspecified  . Anemia    unspecified  . Arthritis    osteoarthritis  . Bleeding hemorrhoid   . Breast cancer ([place])    unspecified  . Cancer ([place])    Breast  . Cataract   . Diverticulosis   . DVT (deep vein thrombosis) in pregnancy   . Hiatal hernia   . Hypertension   . Incontinence   . Osteoarthritis   . Osteoporosis    a. Intolerance to Fosamax b. Bilateral foot fractures c. IV Boniva d. Low vitamin D e. Reclast  . Parkinson disease ([place])   . PE (pulmonary thromboembolism) ([place])   . Post-menopausal atrophic vaginitis 01/28/2017  . Scleritis    Idiopathic a. Prednisone b. s/p  methotrexate  . TIA (transient ischemic attack) 1997   Possible  . Urinary urgency   . Vitamin B12 deficiency     Surgical History: Past Surgical History:  Procedure Laterality Date  . ABDOMINAL HYSTERECTOMY    . ABDOMINAL HYSTERECTOMY     partial  . BLADDER TACK SURGERY     Dr. Ouida Sills  . BREAST BIOPSY Left 10/2012   benign  . BREAST BIOPSY Left 11/22/2015   invasive mammary ca   . BREAST EXCISIONAL BIOPSY Left 12/08/2015   lumpectomy  . BREAST LUMPECTOMY Left 11/2015   Invasive mammary carcinoma Grade I  . EYE SURGERY Bilateral    Catarct Extraction with IOL  . FINGER SURGERY Right   . FOOT FRACTURE SURGERY Right 06/2003   pinning, Dr. Francia Greaves, [facility]  . FOOT SURGERY Left   . INCONTINENCE SURGERY    . NEUROMA SURGERY    . PACEMAKER INSERTION Right 10/02/2016   Procedure: INSERTION PACEMAKER;  Surgeon: Isaias Cowman, MD;  Location: [facility] ORS;  Service: Cardiovascular;  Laterality: Right;  . PARTIAL MASTECTOMY WITH AXILLARY SENTINEL LYMPH NODE BIOPSY Left 12/08/2015   Procedure: PARTIAL MASTECTOMY WITH AXILLARY SENTINEL LYMPH NODE BIOPSY;  Surgeon: [name], MD;  Location: [facility] ORS;  Service: General;  Laterality: Left;    Home Medications:  Allergies as of  03/04/2018   No Known Allergies     Medication List       Accurate as of March 04, 2018  3:46 PM. Always use your most recent med list.        acetaminophen 325 MG tablet Commonly known as:  TYLENOL Take 650 mg by mouth 3 (three) times daily as needed.   ASPERCREME W/LIDOCAINE 4 % Crea Generic drug:  Lidocaine HCl Apply liberal amount topically  to shoulders/ neck or any other area of pain TID prn for pain/OA   atropine 1 % ophthalmic solution   calcium carbonate 1500 (600 Ca) MG Tabs tablet Commonly known as:  OSCAL Take 2 tablets by mouth daily.   CALCIUM HIGH POTENCY/VITAMIN D 600-200 MG-UNIT Tabs Generic drug:  Calcium Carbonate-Vitamin D Take by mouth.   carbidopa-levodopa  50-200 MG tablet Commonly known as:  SINEMET CR Take 3 tablets by mouth 3 (three) times daily.   CLEAR EYES COMPLETE Soln Place 2 drops into both eyes every 2 (two) hours as needed.   diltiazem 120 MG tablet Commonly known as:  CARDIZEM Take 120 mg by mouth daily.   furosemide 40 MG tablet Commonly known as:  LASIX Take 40 mg by mouth daily.   latanoprost 0.005 % ophthalmic solution Commonly known as:  XALATAN Place 1 drop into both eyes at bedtime. Per Dr. Wallace Going   Melatonin 3 MG Tabs Give 2 tablets (6 mg) by mouth at bedtime   mirabegron ER 25 MG Tb24 tablet Commonly known as:  MYRBETRIQ Take 25 mg by mouth daily.   NON FORMULARY Diet Type: Regular diet   ondansetron 4 MG tablet Commonly known as:  ZOFRAN Take 4 mg by mouth every 6 (six) hours as needed for nausea or vomiting.   potassium chloride 10 MEQ CR capsule Commonly known as:  MICRO-K 20 mEq   potassium chloride SA 20 MEQ tablet Commonly known as:  K-DUR,KLOR-CON Take 20 mEq by mouth daily.   PREMARIN vaginal cream Generic drug:  conjugated estrogens Apply 0.5 mg ( pea-sized amount) just inside the vaginal introitus with finger-tip weekly on Monday nights   rivaroxaban 20 MG Tabs tablet Commonly known as:  XARELTO Take 20 mg by mouth daily with supper.   rOPINIRole 0.5 MG tablet Commonly known as:  REQUIP Take 1 tablet (0.5 mg total) by mouth at bedtime.   VITAMIN B-12 IJ Inject 1 mL into the muscle every 30 (thirty) days. Once a day on the 1st of the month       Allergies: No Known Allergies  Family History: Family History  Problem Relation Age of Onset  . Esophageal cancer Sister   . Kidney disease Neg Hx   . Bladder Cancer Neg Hx   . Breast cancer Neg Hx     Social History:  reports that she has never smoked. She has never used smokeless tobacco. She reports that she does not drink alcohol or use drugs.  ROS: UROLOGY Frequent Urination?: No Hard to postpone urination?:  No Burning/pain with urination?: Yes Get up at night to urinate?: No Leakage of urine?: No Urine stream starts and stops?: No Trouble starting stream?: No Do you have to strain to urinate?: No Blood in urine?: No Urinary tract infection?: No Sexually transmitted disease?: No Injury to kidneys or bladder?: No Painful intercourse?: No Weak stream?: No Currently pregnant?: No Vaginal bleeding?: No Last menstrual period?: n  Gastrointestinal Nausea?: No Vomiting?: No Indigestion/heartburn?: No Diarrhea?: No Constipation?: No  Constitutional Fever: No  Night sweats?: No Weight loss?: No Fatigue?: No  Skin Skin rash/lesions?: No Itching?: No  Eyes Blurred vision?: No Double vision?: No  Ears/Nose/Throat Sore throat?: No Sinus problems?: No  Hematologic/Lymphatic Swollen glands?: No Easy bruising?: No  Cardiovascular Leg swelling?: No Chest pain?: No  Respiratory Cough?: No Shortness of breath?: No  Endocrine Excessive thirst?: No  Musculoskeletal Back pain?: No Joint pain?: No  Neurological Headaches?: No Dizziness?: No  Psychologic Depression?: No Anxiety?: No  Physical Exam: BP 121/74 (BP Location: Right Arm, Patient Position: Sitting)   Pulse 85   Ht 5' (1.524 m)   Wt 120 lb (54.4 kg)   BMI 23.44 kg/m   Constitutional:  Well nourished. Alert and oriented, No acute distress. HEENT: [place] AT, moist mucus membranes.  Trachea midline, no masses. Cardiovascular: No clubbing, cyanosis, or edema. Respiratory: Normal respiratory effort, no increased work of breathing. GU: No CVA tenderness.  No bladder fullness or masses.  Atrophic external genitalia, sparse pubic hair distribution, no lesions.  Normal urethral meatus, no lesions, no prolapse, no discharge.   No urethral masses, tenderness and/or tenderness. No bladder fullness, tenderness or masses. pale vagina mucosa, cervix and uterus surgically absent, Fair estrogen effect, no discharge, no  lesions, fair pelvic support, grade II cystocele and no rectocele noted. No adnexal/parametria masses or tenderness noted.  Anus and perineum are without rashes or lesions.    Skin: No rashes, bruises or suspicious lesions. Neurologic: Grossly intact, no focal deficits, moving all 4 extremities. Psychiatric: Normal mood and affect.   Laboratory Data:  Urinalysis:   Cath UA shows 11-30 WBC and moderate bacteria.   I have reviewed the labs.  Assessment & Plan:    1. Recurrent UTI's  - reviewed UTI prevention  - remind patient to contact our office when she has symptoms of an UTI  -Cath UA shows 11-30 WBC and moderate bacteria  -Urine culture sent - pending; will call [name] with results   2. Incontinence  - continue estrogen cream; once weekly  - continue Myrbetriq 25 mg daily  - present to clinic in 12 months for PVR and symptom recheck   3. Vaginal atrophy  - continue applying the cream once weekly  - Will call with urine culture results   Return for will call [name] with results about urine culture .  These notes generated with voice recognition software. I apologize for typographical errors.  Zara Council, PA-C  [place] I have reviewed the above documentation for accuracy and completeness, and I agree with the above.    [address], [place] [address] [phone]  I, Lucas Mallow, am acting as a Education administrator for [employer],  I have reviewed the above documentation for accuracy and completeness, and I agree with the above.    Zara Council, PA-C

## 2018-03-06 ENCOUNTER — Non-Acute Institutional Stay (SKILLED_NURSING_FACILITY): Payer: Medicare Other | Admitting: Adult Health

## 2018-03-06 ENCOUNTER — Encounter: Payer: Self-pay | Admitting: Adult Health

## 2018-03-06 DIAGNOSIS — K5909 Other constipation: Secondary | ICD-10-CM | POA: Diagnosis not present

## 2018-03-06 DIAGNOSIS — I480 Paroxysmal atrial fibrillation: Secondary | ICD-10-CM

## 2018-03-06 DIAGNOSIS — Z853 Personal history of malignant neoplasm of breast: Secondary | ICD-10-CM | POA: Diagnosis not present

## 2018-03-06 DIAGNOSIS — M810 Age-related osteoporosis without current pathological fracture: Secondary | ICD-10-CM | POA: Diagnosis not present

## 2018-03-06 NOTE — Progress Notes (Signed)
[facility]   [facility] Room Number: [place] of Service:  SNF (31)   CODE STATUS: DNR  No Known Allergies  Chief Complaint  Patient presents with  . Medical Management of Chronic Issues    AF(paroxysmal atrial fibrillation) chronic constipation; age related osteoporosis without current pathological fracture; personal history of malignant neoplasm of breast.     HPI:  She is a 85 year old long term resident of this facility being seen for the management of her chronic illnesses: afib; constipation; osteoporosis; breast cancer. She denies any uncontrolled pain; prefrers to ask for her pain medication rather than on a routine basis. She denies any constipation; no anxiety or insomnia.   Past Medical History:  Diagnosis Date  . A-fib ([place])    unspecified  . Anemia    unspecified  . Arthritis    osteoarthritis  . Bleeding hemorrhoid   . Breast cancer ([place])    unspecified  . Cancer ([place])    Breast  . Cataract   . Diverticulosis   . DVT (deep vein thrombosis) in pregnancy   . Hiatal hernia   . Hypertension   . Incontinence   . Osteoarthritis   . Osteoporosis    a. Intolerance to Fosamax b. Bilateral foot fractures c. IV Boniva d. Low vitamin D e. Reclast  . Parkinson disease ([place])   . PE (pulmonary thromboembolism) ([place])   . Post-menopausal atrophic vaginitis 01/28/2017  . Scleritis    Idiopathic a. Prednisone b. s/p methotrexate  . TIA (transient ischemic attack) 1997   Possible  . Urinary urgency   . Vitamin B12 deficiency     Past Surgical History:  Procedure Laterality Date  . ABDOMINAL HYSTERECTOMY    . ABDOMINAL HYSTERECTOMY     partial  . BLADDER TACK SURGERY     Dr. Ouida Sills  . BREAST BIOPSY Left 10/2012   benign  . BREAST BIOPSY Left 11/22/2015   invasive mammary ca   . BREAST EXCISIONAL BIOPSY Left 12/08/2015   lumpectomy  . BREAST LUMPECTOMY Left 11/2015   Invasive mammary carcinoma Grade I  . EYE SURGERY  Bilateral    Catarct Extraction with IOL  . FINGER SURGERY Right   . FOOT FRACTURE SURGERY Right 06/2003   pinning, Dr. Francia Greaves, [facility]  . FOOT SURGERY Left   . INCONTINENCE SURGERY    . NEUROMA SURGERY    . PACEMAKER INSERTION Right 10/02/2016   Procedure: INSERTION PACEMAKER;  Surgeon: Isaias Cowman, MD;  Location: [facility] ORS;  Service: Cardiovascular;  Laterality: Right;  . PARTIAL MASTECTOMY WITH AXILLARY SENTINEL LYMPH NODE BIOPSY Left 12/08/2015   Procedure: PARTIAL MASTECTOMY WITH AXILLARY SENTINEL LYMPH NODE BIOPSY;  Surgeon: Leonie Galadriel Shroff, MD;  Location: [facility] ORS;  Service: General;  Laterality: Left;    Social History   Socioeconomic History  . Marital status: Widowed    Spouse name: Not on file  . Number of children: 2  . Years of education: Not on file  . Highest education level: Not on file  Occupational History  . Not on file  Social Needs  . Financial resource strain: Not on file  . Food insecurity:    Worry: Not on file    Inability: Not on file  . Transportation needs:    Medical: Not on file    Non-medical: Not on file  Tobacco Use  . Smoking status: Never Smoker  . Smokeless tobacco: Never Used  Substance and Sexual Activity  . Alcohol  use: No  . Drug use: No  . Sexual activity: Never  Lifestyle  . Physical activity:    Days per week: Not on file    Minutes per session: Not on file  . Stress: Not on file  Relationships  . Social connections:    Talks on phone: Not on file    Gets together: Not on file    Attends religious service: Not on file    Active member of club or organization: Not on file    Attends meetings of clubs or organizations: Not on file    Relationship status: Not on file  . Intimate partner violence:    Fear of current or ex partner: Not on file    Emotionally abused: Not on file    Physically abused: Not on file    Forced sexual activity: Not on file  Other Topics Concern  . Not on file  Social History Narrative    Widowed   2 children   Never smoker   Denies alcohol use   Full Code   Family History  Problem Relation Age of Onset  . Esophageal cancer Sister   . Kidney disease Neg Hx   . Bladder Cancer Neg Hx   . Breast cancer Neg Hx       VITAL SIGNS BP 91/66   Pulse 99   Temp 98.3 F (36.8 C)   Resp (!) 24   Ht 5' (1.524 m)   Wt 118 lb (53.5 kg)   SpO2 95%   BMI 23.05 kg/m   Outpatient Encounter Medications as of 03/06/2018  Medication Sig  . atropine 1 % ophthalmic solution   . calcium carbonate (OSCAL) 1500 (600 Ca) MG TABS tablet Take 2 tablets by mouth daily.  . carbidopa-levodopa (SINEMET CR) 50-200 MG tablet Take 3 tablets by mouth 3 (three) times daily.  Marland Kitchen conjugated estrogens (PREMARIN) vaginal cream Apply 0.5 mg ( pea-sized amount) just inside the vaginal introitus with finger-tip weekly on Monday nights  . Cyanocobalamin (VITAMIN B-12 IJ) Inject 1 mL into the muscle every 30 (thirty) days. Once a day on the 1st of the month  . diltiazem (CARDIZEM) 120 MG tablet Take 120 mg by mouth daily.  . furosemide (LASIX) 40 MG tablet Take 40 mg by mouth daily.   . Hyprom-Naphaz-Polysorb-Zn Sulf (CLEAR EYES COMPLETE) SOLN Place 2 drops into both eyes every 2 (two) hours as needed.  . latanoprost (XALATAN) 0.005 % ophthalmic solution Place 1 drop into both eyes at bedtime. Per Dr. Wallace Going  . Lidocaine HCl (ASPERCREME W/LIDOCAINE) 4 % CREA Apply liberal amount topically  to shoulders/ neck or any other area of pain TID prn for pain/OA  . Melatonin 3 MG TABS Give 2 tablets (6 mg) by mouth at bedtime  . mirabegron ER (MYRBETRIQ) 25 MG TB24 tablet Take 25 mg by mouth daily.  . NON FORMULARY Diet Type: Regular diet. Small portions  . ondansetron (ZOFRAN) 4 MG tablet Take 4 mg by mouth every 6 (six) hours as needed for nausea or vomiting.  . potassium chloride SA (K-DUR,KLOR-CON) 20 MEQ tablet Take 20 mEq by mouth daily.   . rivaroxaban (XARELTO) 20 MG TABS tablet Take 20 mg by mouth daily  with supper.  Marland Kitchen rOPINIRole (REQUIP) 0.5 MG tablet Take 1 tablet (0.5 mg total) by mouth at bedtime.  Marland Kitchen acetaminophen (TYLENOL) 325 MG tablet Take 650 mg by mouth 3 (three) times daily as needed.  . [DISCONTINUED] Calcium Carbonate-Vitamin D (CALCIUM HIGH POTENCY/VITAMIN  D) 600-200 MG-UNIT TABS Take 2 tablets by mouth daily.   . [DISCONTINUED] potassium chloride (MICRO-K) 10 MEQ CR capsule 20 mEq   No facility-administered encounter medications on file as of 03/06/2018.      SIGNIFICANT DIAGNOSTIC EXAMS  PREVIOUS:   11-10-17: diagnostic bilateral mammogram:No evidence of malignancy within either breast. Stable postsurgical changes within the LEFT breast.   12-09-17: bone density:  Spine: T score -3.0; hip -2.9  NO NEW EXAMS.   LABS REVIEWED: PREVIOUS   07-03-17: glucose 122; bun 15; creat 0.81; k+ 3.8; na++ 139; ca 9.6 08-04-17: wbc 6.9; hgb 12.6; hct 36.2; mcv 92.5; plt 311; glucose 83; bun 17; creat 0.63; k+ 3.7; na++ 141; ca 9.0 liver normal albumin 3.3 mag 2.3; tsh 1.983 vit B 12: 800; vit D 51.9  09-30-17: urine culture: multiple species 10-03-17: urine culture: <10,000 colonies  12-29-17: wbc 8.2; hgb 13.0; hct 39.9; mcv 94.3; plt 292; glucose 107; bun 20; creat 0.81; k+ 3.8; na++ 134; ca 9.8; liver normal albumin 3.9  01-30-18: urine culture: multiple species   NO NEW LABS.      Review of Systems  Constitutional: Negative for malaise/fatigue.  Respiratory: Negative for cough and shortness of breath.   Cardiovascular: Negative for chest pain, palpitations and leg swelling.  Gastrointestinal: Negative for abdominal pain, constipation and heartburn.  Musculoskeletal: Negative for back pain, joint pain and myalgias.  Skin: Negative.   Neurological: Negative for dizziness.  Psychiatric/Behavioral: The patient is not nervous/anxious.     Physical Exam Constitutional:      General: She is not in acute distress.    Appearance: She is well-developed. She is not diaphoretic.  Neck:      Musculoskeletal: Neck supple.     Thyroid: No thyromegaly.  Cardiovascular:     Rate and Rhythm: Normal rate. Rhythm irregular.     Pulses: Normal pulses.     Heart sounds: Normal heart sounds.  Pulmonary:     Effort: Pulmonary effort is normal. No respiratory distress.     Breath sounds: Normal breath sounds.  Abdominal:     General: Bowel sounds are normal. There is no distension.     Palpations: Abdomen is soft.     Tenderness: There is no abdominal tenderness.  Musculoskeletal:     Right lower leg: No edema.     Left lower leg: No edema.     Comments: Able to move all extremities Kyphosis Uses walker    Lymphadenopathy:     Cervical: No cervical adenopathy.  Skin:    General: Skin is warm and dry.     Comments: History of partial left mastectomy   Neurological:     Mental Status: She is alert. Mental status is at baseline.  Psychiatric:        Mood and Affect: Mood normal.      ASSESSMENT/PLAN  TODAY:   1. Chronic constipation: is stable will continue senna s twice daily and twice daily as needed   2. Osteoporosis without current pathological fracture: is without change: is status post 5 years Zometa therapy; is on prolia last given 07-03-17; prolia held due to renal function on 02-03-18.   3. Left breast stage I cancer: is status post partial left mastectomy (12-08-15); is status post tamoxifen therapy was stopped due to blood clots. Radiation therapy was deferred due to advanced age. Is followed by oncology.   4. Paroxysmal atrial fibrillation/has sick sinus snydomre: status post pacemaker  heart rate stable; will continue cardizem 120  mg daily for rate control is taking xarelto 20 mg daily   PREVIOUS  5. DVT of distal vein of right lower extremity; PE: is stable is on long xarelto 20 mg daily   6. Chronic diastolic heart failure: is stable will continue lasix 40 mg daily with k+ 20 meq daily   7. Vit B 12 Deficiency: is stable will continue monthly injections    8. Hypertensive heart disease with heart failure: is stable b/p 91/66: will continue cardizem 120 mg daily   9. Parkinson disease: is stable will continue sinemet 50/200 mg ( 3 tabs)three times daily and requip 0.5 mg nightly   10. Overactive bladder: has post-menopausal atrophic vaginitis: is stable will continue myrbetric 25 mg daily and premarin vaginal cream weekly   11,  bilateral glaucoma: is status post bilateral cataract surgery is stable will continue xalatan to both eyes nightly   Will give pneumovax    MD is aware of resident's narcotic use and is in agreement with current plan of care. We will attempt to wean resident as apropriate   Ok Edwards NP [facility]  Contact [phone] Monday through Friday 8am- 5pm  After hours call [phone]

## 2018-03-10 ENCOUNTER — Telehealth: Payer: Self-pay | Admitting: Family Medicine

## 2018-03-10 LAB — CULTURE, URINE COMPREHENSIVE

## 2018-03-10 MED ORDER — NITROFURANTOIN MONOHYD MACRO 100 MG PO CAPS: 100.0000 mg | ORAL_CAPSULE | Freq: Two times a day (BID) | ORAL | 0 refills | Status: DC

## 2018-03-10 NOTE — Telephone Encounter (Signed)
-----   Message from Nori Riis, PA-C sent at 03/10/2018 10:30 AM EST ----- Please let Mrs. Baris son, [name], know that her urine culture returned positive and we need to start Macrobid 100 mg, BID x 5 days.

## 2018-03-11 ENCOUNTER — Non-Acute Institutional Stay (SKILLED_NURSING_FACILITY): Payer: Medicare Other | Admitting: Adult Health

## 2018-03-11 ENCOUNTER — Encounter: Payer: Self-pay | Admitting: Adult Health

## 2018-03-11 DIAGNOSIS — G20 Parkinson's disease: Secondary | ICD-10-CM | POA: Diagnosis not present

## 2018-03-11 DIAGNOSIS — I5032 Chronic diastolic (congestive) heart failure: Secondary | ICD-10-CM | POA: Diagnosis not present

## 2018-03-11 DIAGNOSIS — N390 Urinary tract infection, site not specified: Secondary | ICD-10-CM

## 2018-03-11 NOTE — Progress Notes (Signed)
[facility]:   [facility] Room Number: [place] of Service:  SNF (31)   CODE STATUS: DNR  No Known Allergies  Chief Complaint  Patient presents with  . Acute Visit    Care Plan Meeting    HPI:  We have come together for her routine care plan meeting. She does have family present. She has had one fall since last care plan meeting without injury. She is presently being treated for UTI; her urologist has started the ABT due to abdominal pain; urgency and dysuria. There are no reports of changes in appetite; no significant weight change.    Past Medical History:  Diagnosis Date  . A-fib ([place])    unspecified  . Anemia    unspecified  . Arthritis    osteoarthritis  . Bleeding hemorrhoid   . Breast cancer ([place])    unspecified  . Cancer ([place])    Breast  . Cataract   . Diverticulosis   . DVT (deep vein thrombosis) in pregnancy   . Hiatal hernia   . Hypertension   . Incontinence   . Osteoarthritis   . Osteoporosis    a. Intolerance to Fosamax b. Bilateral foot fractures c. IV Boniva d. Low vitamin D e. Reclast  . Parkinson disease ([place])   . PE (pulmonary thromboembolism) ([place])   . Post-menopausal atrophic vaginitis 01/28/2017  . Scleritis    Idiopathic a. Prednisone b. s/p methotrexate  . TIA (transient ischemic attack) 1997   Possible  . Urinary urgency   . Vitamin B12 deficiency     Past Surgical History:  Procedure Laterality Date  . ABDOMINAL HYSTERECTOMY    . ABDOMINAL HYSTERECTOMY     partial  . BLADDER TACK SURGERY     Dr. Ouida Sills  . BREAST BIOPSY Left 10/2012   benign  . BREAST BIOPSY Left 11/22/2015   invasive mammary ca   . BREAST EXCISIONAL BIOPSY Left 12/08/2015   lumpectomy  . BREAST LUMPECTOMY Left 11/2015   Invasive mammary carcinoma Grade I  . EYE SURGERY Bilateral    Catarct Extraction with IOL  . FINGER SURGERY Right   . FOOT FRACTURE SURGERY Right 06/2003   pinning, Dr. Francia Greaves, [facility]  . FOOT SURGERY Left    . INCONTINENCE SURGERY    . NEUROMA SURGERY    . PACEMAKER INSERTION Right 10/02/2016   Procedure: INSERTION PACEMAKER;  Surgeon: Isaias Cowman, MD;  Location: [facility] ORS;  Service: Cardiovascular;  Laterality: Right;  . PARTIAL MASTECTOMY WITH AXILLARY SENTINEL LYMPH NODE BIOPSY Left 12/08/2015   Procedure: PARTIAL MASTECTOMY WITH AXILLARY SENTINEL LYMPH NODE BIOPSY;  Surgeon: Leonie Theadore Blunck, MD;  Location: [facility] ORS;  Service: General;  Laterality: Left;    Social History   Socioeconomic History  . Marital status: Widowed    Spouse name: Not on file  . Number of children: 2  . Years of education: Not on file  . Highest education level: Not on file  Occupational History  . Not on file  Social Needs  . Financial resource strain: Not on file  . Food insecurity:    Worry: Not on file    Inability: Not on file  . Transportation needs:    Medical: Not on file    Non-medical: Not on file  Tobacco Use  . Smoking status: Never Smoker  . Smokeless tobacco: Never Used  Substance and Sexual Activity  . Alcohol use: No  . Drug use: No  . Sexual activity: Never  Lifestyle  . Physical activity:    Days per week: Not on file    Minutes per session: Not on file  . Stress: Not on file  Relationships  . Social connections:    Talks on phone: Not on file    Gets together: Not on file    Attends religious service: Not on file    Active member of club or organization: Not on file    Attends meetings of clubs or organizations: Not on file    Relationship status: Not on file  . Intimate partner violence:    Fear of current or ex partner: Not on file    Emotionally abused: Not on file    Physically abused: Not on file    Forced sexual activity: Not on file  Other Topics Concern  . Not on file  Social History Narrative   Widowed   2 children   Never smoker   Denies alcohol use   Full Code   Family History  Problem Relation Age of Onset  . Esophageal cancer Sister   .  Kidney disease Neg Hx   . Bladder Cancer Neg Hx   . Breast cancer Neg Hx       VITAL SIGNS BP 126/71   Pulse 91   Temp 98.4 F (36.9 C)   Resp 13   Ht 5' (1.524 m)   Wt 118 lb 12.8 oz (53.9 kg)   SpO2 99%   BMI 23.20 kg/m   Outpatient Encounter Medications as of 03/11/2018  Medication Sig  . acetaminophen (TYLENOL) 325 MG tablet Take 650 mg by mouth every 6 (six) hours as needed.   Marland Kitchen atropine 1 % ophthalmic solution Give 2 drops Under tongue post meals as needed for excessive drooling  . calcium carbonate (OSCAL) 1500 (600 Ca) MG TABS tablet Take 2 tablets by mouth daily.  . carbidopa-levodopa (SINEMET CR) 50-200 MG tablet Take 3 tablets by mouth 3 (three) times daily.  Marland Kitchen conjugated estrogens (PREMARIN) vaginal cream Apply 0.5 mg ( pea-sized amount) just inside the vaginal introitus with finger-tip weekly on Monday nights  . Cyanocobalamin (VITAMIN B-12 IJ) Inject 1 mL into the muscle every 30 (thirty) days. Once a day on the 1st of the month  . diltiazem (CARDIZEM) 120 MG tablet Take 120 mg by mouth daily.  . furosemide (LASIX) 40 MG tablet Take 40 mg by mouth daily.   . Hyprom-Naphaz-Polysorb-Zn Sulf (CLEAR EYES COMPLETE) SOLN Place 2 drops into both eyes every 2 (two) hours as needed.  . latanoprost (XALATAN) 0.005 % ophthalmic solution Place 1 drop into both eyes at bedtime. Per Dr. Wallace Going  . Lidocaine HCl (ASPERCREME W/LIDOCAINE) 4 % CREA Apply liberal amount topically  to shoulders/ neck or any other area of pain TID prn for pain/OA  . Melatonin 3 MG TABS Give 2 tablets (6 mg) by mouth at bedtime  . mirabegron ER (MYRBETRIQ) 25 MG TB24 tablet Take 25 mg by mouth daily.  . NON FORMULARY Diet Type: Regular diet. Small portions  . ondansetron (ZOFRAN) 4 MG tablet Take 4 mg by mouth every 6 (six) hours as needed for nausea or vomiting.  . potassium chloride SA (K-DUR,KLOR-CON) 20 MEQ tablet Take 20 mEq by mouth daily.   . Probiotic Product (RISA-BID PROBIOTIC) TABS Take 1  tablet by mouth 2 (two) times daily.  . rivaroxaban (XARELTO) 20 MG TABS tablet Take 20 mg by mouth daily with supper.  Marland Kitchen rOPINIRole (REQUIP) 0.5 MG tablet Take 1 tablet (  0.5 mg total) by mouth at bedtime.  . nitrofurantoin, macrocrystal-monohydrate, (MACROBID) 100 MG capsule Take 1 capsule (100 mg total) by mouth every 12 (twelve) hours.   No facility-administered encounter medications on file as of 03/11/2018.      SIGNIFICANT DIAGNOSTIC EXAMS  PREVIOUS:   11-10-17: diagnostic bilateral mammogram:No evidence of malignancy within either breast. Stable postsurgical changes within the LEFT breast.   12-09-17: bone density:  Spine: T score -3.0; hip -2.9  NO NEW EXAMS.   LABS REVIEWED: PREVIOUS   07-03-17: glucose 122; bun 15; creat 0.81; k+ 3.8; na++ 139; ca 9.6 08-04-17: wbc 6.9; hgb 12.6; hct 36.2; mcv 92.5; plt 311; glucose 83; bun 17; creat 0.63; k+ 3.7; na++ 141; ca 9.0 liver normal albumin 3.3 mag 2.3; tsh 1.983 vit B 12: 800; vit D 51.9  09-30-17: urine culture: multiple species 10-03-17: urine culture: <10,000 colonies  12-29-17: wbc 8.2; hgb 13.0; hct 39.9; mcv 94.3; plt 292; glucose 107; bun 20; creat 0.81; k+ 3.8; na++ 134; ca 9.8; liver normal albumin 3.9  01-30-18: urine culture: multiple species   TODAY:   03-04-18: urine culture: enterococcus species: macrobid      Review of Systems  Constitutional: Negative for malaise/fatigue.  Respiratory: Negative for cough and shortness of breath.   Cardiovascular: Negative for chest pain, palpitations and leg swelling.  Gastrointestinal: Negative for abdominal pain, constipation and heartburn.  Musculoskeletal: Negative for back pain, joint pain and myalgias.  Skin: Negative.   Neurological: Negative for dizziness.  Psychiatric/Behavioral: The patient is not nervous/anxious.      Physical Exam Constitutional:      General: She is not in acute distress.    Appearance: She is well-developed. She is not diaphoretic.  Neck:      Musculoskeletal: Neck supple.     Thyroid: No thyromegaly.  Cardiovascular:     Rate and Rhythm: Normal rate. Rhythm irregular.     Pulses: Normal pulses.     Heart sounds: Normal heart sounds.  Pulmonary:     Effort: Pulmonary effort is normal. No respiratory distress.     Breath sounds: Normal breath sounds.  Abdominal:     General: Bowel sounds are normal. There is no distension.     Palpations: Abdomen is soft.     Tenderness: There is no abdominal tenderness.  Musculoskeletal:     Right lower leg: No edema.     Left lower leg: No edema.     Comments: Able to move all extremities Kyphosis Uses walker     Lymphadenopathy:     Cervical: No cervical adenopathy.  Skin:    General: Skin is warm and dry.     Comments: History of partial left mastectomy    Neurological:     Mental Status: She is alert. Mental status is at baseline.  Psychiatric:        Mood and Affect: Mood normal.       ASSESSMENT/PLAN  TODAY:   1.  Parkinson's disease 2.  Frequent UTI 3.  Chronic diastolic heart failure  Will continue her current medication regimen Will continue her current plan of care Is awaiting placement in another SNF due to building closure.       MD is aware of resident's narcotic use and is in agreement with current plan of care. We will attempt to wean resident as apropriate   Ok Edwards NP [facility] Adult [facility]  Contact [phone] Monday through Friday 8am- 5pm  After hours call [phone]

## 2018-03-14 DIAGNOSIS — N390 Urinary tract infection, site not specified: Secondary | ICD-10-CM | POA: Insufficient documentation

## 2018-03-30 ENCOUNTER — Encounter
Admission: RE | Admit: 2018-03-30 | Discharge: 2018-03-30 | Disposition: A | Discharge status: Home / Self Care | Payer: Medicaid Other | Source: Ambulatory Visit | Attending: Internal Medicine | Admitting: Internal Medicine

## 2018-03-30 ENCOUNTER — Non-Acute Institutional Stay (SKILLED_NURSING_FACILITY): Payer: Medicare Other | Admitting: Adult Health

## 2018-03-30 ENCOUNTER — Encounter: Payer: Self-pay | Admitting: Adult Health

## 2018-03-30 DIAGNOSIS — G4709 Other insomnia: Secondary | ICD-10-CM

## 2018-03-30 DIAGNOSIS — G2581 Restless legs syndrome: Secondary | ICD-10-CM

## 2018-03-30 DIAGNOSIS — I5032 Chronic diastolic (congestive) heart failure: Secondary | ICD-10-CM

## 2018-03-30 DIAGNOSIS — G20 Parkinson's disease: Secondary | ICD-10-CM

## 2018-03-30 DIAGNOSIS — I480 Paroxysmal atrial fibrillation: Secondary | ICD-10-CM | POA: Diagnosis not present

## 2018-03-30 NOTE — Progress Notes (Signed)
[facility]:  [facility] Number: [id number] Place of Service:  SNF (31) Provider:  Durenda Age, NP  Patient Care Team: Kirk Ruths, MD as PCP - General (Internal Medicine) Nyoka Cowden Phylis Bougie, NP as Nurse Practitioner (Geriatric Medicine)  Extended Emergency Contact Information Primary Emergency Contact: Monte,Timothy L Address: [address]          [place], [place] [address] [name] of [place] Phone: [phone] Relation: Son Secondary Emergency Contact: Clink,James Address: [address]          [place], [place] [address] [name] of [place] Phone: [phone] Work Phone: [phone] Mobile Phone: [phone] Relation: Son  Code Status:  DNR  Goals of care: Advanced Directive information Advanced Directives 03/11/2018  Does Patient Have a Medical Advance Directive? Yes  Type of Advance Directive Living will;Healthcare Power of [place];Out of facility DNR (pink MOST or yellow form)  Does patient want to make changes to medical advance directive? No - Patient declined  Copy of [place] in Chart? Yes - validated most recent copy scanned in chart (See row information)  Would patient like information on creating a medical advance directive? No - Patient declined  Pre-existing out of facility DNR order (yellow form or pink MOST form) Pink MOST form placed in chart (order not valid for inpatient use)     Chief Complaint  Patient presents with  . Medical Management of Chronic Issues    [facility]    HPI:  Pt is a 85 y.o. female seen today for medical management of chronic diseases.  She is a long[facility] care [facility].  She has a PMH of atrial fibrillation, breast cancer, HTN, OA, Parkinson's disease, and PE. She was seen in the room today with son and brother at bedside. It was reported that when they started the Atropine drops for her  excessive oral secretions, she started having redness and swelling on her lower lip so it was discontinued. Son and brother think that she is not allergic to it. She, apparently, has a habit of pushing back her dentures when it gets out of place which probably caused her redness on the lips. She was not noted to have any increased oral secretions this morning. Resident said that her drooling happens in the afternoon most of the time. Informed them of the alternative treatment which is Scopolamine but brother does not want her to be started on any new medications at this time.   Past Medical History:  Diagnosis Date  . A-fib ([place])    unspecified  . Anemia    unspecified  . Arthritis    osteoarthritis  . Bleeding hemorrhoid   . Breast cancer ([place])    unspecified  . Cancer ([place])    Breast  . Cataract   . Diverticulosis   . DVT (deep vein thrombosis) in pregnancy   . Hiatal hernia   . Hypertension   . Incontinence   . Osteoarthritis   . Osteoporosis    a. Intolerance to Fosamax b. Bilateral foot fractures c. IV Boniva d. Low vitamin D e. Reclast  . Parkinson disease ([place])   . PE (pulmonary thromboembolism) ([place])   . Post-menopausal atrophic vaginitis 01/28/2017  . Scleritis    Idiopathic a. Prednisone b. s/p methotrexate  . TIA (transient ischemic attack) 1997   Possible  . Urinary urgency   . Vitamin B12 deficiency    Past Surgical  History:  Procedure Laterality Date  . ABDOMINAL HYSTERECTOMY    . ABDOMINAL HYSTERECTOMY     partial  . BLADDER TACK SURGERY     Dr. Ouida Sills  . BREAST BIOPSY Left 10/2012   benign  . BREAST BIOPSY Left 11/22/2015   invasive mammary ca   . BREAST EXCISIONAL BIOPSY Left 12/08/2015   lumpectomy  . BREAST LUMPECTOMY Left 11/2015   Invasive mammary carcinoma Grade I  . EYE SURGERY Bilateral    Catarct Extraction with IOL  . FINGER SURGERY Right   . FOOT FRACTURE SURGERY Right 06/2003   pinning, Dr[facility] Francia [facility], [facility]  . FOOT SURGERY Left   .  INCONTINENCE SURGERY    . NEUROMA SURGERY    . PACEMAKER INSERTION Right 10/02/2016   Procedure: INSERTION PACEMAKER;  Surgeon: Isaias Cowman, MD;  Location: [facility] ORS;  Service: Cardiovascular;  Laterality: Right;  . PARTIAL MASTECTOMY WITH AXILLARY SENTINEL LYMPH NODE BIOPSY Left 12/08/2015   Procedure: PARTIAL MASTECTOMY WITH AXILLARY SENTINEL LYMPH NODE BIOPSY;  Surgeon: [name], MD;  Location: [facility] ORS;  Service: General;  Laterality: Left;    No Known Allergies  Outpatient Encounter Medications as of 03/30/2018  Medication Sig  . acetaminophen (TYLENOL) 325 MG tablet Take 650 mg by mouth every 6 (six) hours as needed.   . calcium carbonate (OSCAL) 1500 (600 Ca) MG TABS tablet Take 2 tablets by mouth daily.   . carbidopa-levodopa (SINEMET CR) 50-200 MG tablet Take 3 tablets by mouth 3 (three) times daily.  Marland Kitchen conjugated estrogens (PREMARIN) vaginal cream Apply 0.5 mg ( pea-sized amount) just inside the vaginal introitus with finger-tip weekly on Monday nights  . Cyanocobalamin (VITAMIN B-12 IJ) Inject 1 mL into the muscle every 30 (thirty) days. Once a day on the 1st of the month  . diltiazem (CARDIZEM) 120 MG tablet Take 120 mg by mouth daily.  . furosemide (LASIX) 40 MG tablet Take 40 mg by mouth daily.   . Hyprom-Naphaz-Polysorb-Zn Sulf (CLEAR EYES COMPLETE) SOLN Place 2 drops into both eyes every 2 (two) hours as needed.  . latanoprost (XALATAN) 0.005 % ophthalmic solution Place 1 drop into both eyes at bedtime. Per Dr. Wallace Going  . Lidocaine HCl (ASPERCREME W/LIDOCAINE) 4 % CREA Apply liberal amount topically  to shoulders/ neck or any other area of pain TID prn for pain/OA  . Melatonin 3 MG TABS Give 2 tablets (6 mg) by mouth at bedtime  . mirabegron ER (MYRBETRIQ) 25 MG TB24 tablet Take 25 mg by mouth daily.  . NON FORMULARY Diet Type: Regular diet. Small portions  . ondansetron (ZOFRAN) 4 MG tablet Take 4 mg by mouth every 6 (six) hours as needed for nausea or  vomiting.  . potassium chloride SA (K-DUR,KLOR-CON) 20 MEQ tablet Take 20 mEq by mouth daily.   . rivaroxaban (XARELTO) 20 MG TABS tablet Take 20 mg by mouth daily with supper.  Marland Kitchen rOPINIRole (REQUIP) 0.5 MG tablet Take 1 tablet (0.5 mg total) by mouth at bedtime.  . [DISCONTINUED] atropine 1 % ophthalmic solution Give 2 drops Under tongue post meals as needed for excessive drooling  . [DISCONTINUED] nitrofurantoin, macrocrystal-monohydrate, (MACROBID) 100 MG capsule Take 1 capsule (100 mg total) by mouth every 12 (twelve) hours.   No facility-administered encounter medications on file as of 03/30/2018.     Review of Systems  GENERAL: No change in appetite, no fatigue, no weight changes, no fever, chills or weakness MOUTH and THROAT: Denies oral discomfort, gingival pain or bleeding RESPIRATORY:  no cough, SOB, DOE, wheezing, hemoptysis CARDIAC: No chest pain, edema or palpitations GI: No abdominal pain, diarrhea, constipation, heart burn, nausea or vomiting GU: Denies dysuria, frequency, hematuria, or discharge NEUROLOGICAL: Denies dizziness, syncope, numbness, or headache PSYCHIATRIC: Denies feelings of depression or anxiety. No report of hallucinations, insomnia, paranoia, or agitation    Immunization History  Administered Date(s) Administered  . Influenza-Unspecified 10/22/2016, 10/30/2017  . PPD Test 09/29/2017  . Pneumococcal Conjugate-13 11/18/2013  . Pneumococcal Polysaccharide-23 03/06/2018  . Pneumococcal-Unspecified 10/24/2009  . Zoster 10/15/2011   Pertinent  Health Maintenance Due  Topic Date Due  . INFLUENZA VACCINE  Completed  . DEXA SCAN  Completed  . PNA vac Low Risk Adult  Completed    Vitals:   03/30/18 1117  BP: 128/75  Pulse: 78  Resp: 12  Temp: 98.4 F (36.9 C)  TempSrc: Oral  SpO2: 99%  Weight: 118 lb 9.6 oz (53.8 kg)  Height: 5' (1.524 m)   Body mass index is 23.16 kg/m.  Physical Exam  GENERAL APPEARANCE: Well nourished. In no acute  distress. Normal body habitus SKIN:  Skin is warm and dry.  MOUTH and THROAT: Lips are without lesions. Oral mucosa is moist and without lesions. RESPIRATORY: Breathing is even & unlabored, BS CTAB CARDIAC: RRR, no murmur,no extra heart sounds, no edema GI: Abdomen soft, normal BS, no masses, no tenderness EXTREMITIES: Able to move X 4 extremities NEUROLOGICAL: There is no tremor. Speech is clear. Alert and oriented X 3. PSYCHIATRIC:  Affect and behavior are appropriate  Labs reviewed: Recent Labs    07/03/17 1400 08/04/17 0540 12/29/17 1444  NA 139 141 134*  K 3.8 3.7 3.8  CL 104 101 98  CO2 25 31 26   GLUCOSE 122* 83 107*  BUN 15 17 20   CREATININE 0.81 0.63 0.81  CALCIUM 9.6 9.0 9.8  MG  --  2.3  --    Recent Labs    06/26/17 1420 08/04/17 0540 12/29/17 1444  AST 19 17 14*  ALT <5* <5 <5  ALKPHOS 65 63 66  BILITOT 0.5 0.6 0.6  PROT 7.2 6.8 7.5  ALBUMIN 3.6 3.3* 3.9   Recent Labs    06/26/17 1420 08/04/17 0540 12/29/17 1444  WBC 8.1 6.9 8.2  NEUTROABS 4.5 3.6 3.9  HGB 13.0 12.6 13.0  HCT 37.9 36.2 39.9  MCV 94.9 92.5 94.3  PLT 298 311 292   Lab Results  Component Value Date   TSH 1.983 08/04/2017    Assessment/Plan  1. Chronic diastolic (congestive) heart failure (HCC) --No SOB, continue Lasix 40 mg 1 tab daily and KCl ER 20 meq 1 tab daily  2. AF (paroxysmal atrial fibrillation) (HCC) -Rate controlled, continue Xarelto 20 mg 1 tab daily  3. Restless leg syndrome -Stable, continue ropinirole 0.5 mg 1 tab at bedtime  4. Other insomnia -Continue melatonin 3 mg 2 tabs = 6 mg at bedtime  5. Parkinson disease ([place]) - No drooling noted at this time, mild tremors noted when holding papers, continue carbidopa-levodopa 50-200 mg 3 tabs 3 times a day, Atropine was discontinued     Family/ staff Communication: Discussed plan of care with resident, son and brother.  Labs/tests ordered:  None  Goals of care:   Long-term care.   Durenda Age,  NP [facility] [phone] (Monday-Friday 8:00 a.m. - 5:00 p.m.) [phone] (after hours)

## 2018-03-31 ENCOUNTER — Ambulatory Visit: Payer: Medicaid Other | Admitting: Urology

## 2018-04-09 ENCOUNTER — Encounter: Payer: Self-pay | Admitting: Adult Health

## 2018-04-09 ENCOUNTER — Non-Acute Institutional Stay (SKILLED_NURSING_FACILITY): Payer: Medicare Other | Admitting: Adult Health

## 2018-04-09 DIAGNOSIS — I5032 Chronic diastolic (congestive) heart failure: Secondary | ICD-10-CM

## 2018-04-09 DIAGNOSIS — G20 Parkinson's disease: Secondary | ICD-10-CM

## 2018-04-09 DIAGNOSIS — I480 Paroxysmal atrial fibrillation: Secondary | ICD-10-CM

## 2018-04-09 DIAGNOSIS — G2581 Restless legs syndrome: Secondary | ICD-10-CM

## 2018-04-09 DIAGNOSIS — I2699 Other pulmonary embolism without acute cor pulmonale: Secondary | ICD-10-CM

## 2018-04-09 DIAGNOSIS — N3281 Overactive bladder: Secondary | ICD-10-CM | POA: Diagnosis not present

## 2018-04-09 NOTE — Progress Notes (Signed)
[facility]:  [facility] Number: [id number] Place of Service:  SNF (31) Provider:  Durenda Age, NP  Patient Care Team: Kirk Ruths, MD as PCP - General (Internal Medicine) Nyoka Cowden Phylis Bougie, NP as Nurse Practitioner (Geriatric Medicine)  Extended Emergency Contact Information Primary Emergency Contact: Myrie,Timothy L Address: [address]          [place], [place] [address] [name] of [place] Phone: [phone] Relation: Son Secondary Emergency Contact: Pritts,James Address: [address]          [place], [place] [address] [name] of [place] Phone: [phone] Work Phone: [phone] Mobile Phone: [phone] Relation: Son  Code Status:  DNR  Goals of care: Advanced Directive information Advanced Directives 04/09/2018  Does Patient Have a Medical Advance Directive? Yes  Type of Paramedic of [place];Out of facility DNR (pink MOST or yellow form)  Does patient want to make changes to medical advance directive? No - Patient declined  Copy of [place] in Chart? Yes - validated most recent copy scanned in chart (See row information)  Would patient like information on creating a medical advance directive? -  Pre-existing out of facility DNR order (yellow form or pink MOST form) -     Chief Complaint  Patient presents with  . Discharge Note    Patient is to transfer to [facility] 04/10/18    HPI:  Pt is a 85 y.o. [place] seen today for a discharge visit.  She is to transfer to [facility] on [facility]/10/[facility]. She has been a long-term care resident of [facility] She has a PMH of atrial fibrillation, breast cancer, HTN, OA, Parkinson's disease, and PE.   Past Medical History:  Diagnosis Date  . A-fib ([place])    unspecified  . Anemia    unspecified  . Arthritis    osteoarthritis  . Bleeding hemorrhoid   . Breast cancer ([place])    unspecified  . Cancer ([place])     Breast  . Cataract   . Diverticulosis   . DVT (deep vein thrombosis) in pregnancy   . Hiatal hernia   . Hypertension   . Incontinence   . Osteoarthritis   . Osteoporosis    a. Intolerance to Fosamax b. Bilateral foot fractures c. IV Boniva d. Low vitamin D e. Reclast  . Parkinson disease ([place])   . PE (pulmonary thromboembolism) ([place])   . Post-menopausal atrophic vaginitis 01/28/2017  . Scleritis    Idiopathic a. Prednisone b. s/p methotrexate  . TIA (transient ischemic attack) 1997   Possible  . Urinary urgency   . Vitamin B12 deficiency    Past Surgical History:  Procedure Laterality Date  . ABDOMINAL HYSTERECTOMY    . ABDOMINAL HYSTERECTOMY     partial  . BLADDER TACK SURGERY     Dr. Ouida Sills  . BREAST BIOPSY Left 10/2012   benign  . BREAST BIOPSY Left 11/22/2015   invasive mammary ca   . BREAST EXCISIONAL BIOPSY Left 12/08/2015   lumpectomy  . BREAST LUMPECTOMY Left 11/2015   Invasive mammary carcinoma Grade I  . EYE SURGERY Bilateral    Catarct Extraction with IOL  . FINGER SURGERY Right   . FOOT FRACTURE SURGERY Right 06/2003   pinning, Dr[facility] Francia [facility]  . FOOT SURGERY Left   . INCONTINENCE SURGERY    . NEUROMA SURGERY    . PACEMAKER INSERTION Right 10/02/2016   Procedure: INSERTION PACEMAKER;  Surgeon: Isaias Cowman, MD;  Location: [facility] ORS;  Service: Cardiovascular;  Laterality: Right;  . PARTIAL MASTECTOMY WITH AXILLARY SENTINEL LYMPH NODE BIOPSY Left 12/08/2015   Procedure: PARTIAL MASTECTOMY WITH AXILLARY SENTINEL LYMPH NODE BIOPSY;  Surgeon: [name], MD;  Location: [facility] ORS;  Service: General;  Laterality: Left;    No Known Allergies  Outpatient Encounter Medications as of 04/09/2018  Medication Sig  . acetaminophen (TYLENOL) 325 MG tablet Take 650 mg by mouth every 6 (six) hours as needed.   . calcium carbonate (OSCAL) 1500 (600 Ca) MG TABS tablet Take 2 tablets by mouth daily.   . carbidopa-levodopa (SINEMET CR) 50-200 MG  tablet Take 3 tablets by mouth 3 (three) times daily.  Marland Kitchen conjugated estrogens (PREMARIN) vaginal cream Apply 0.5 mg ( pea-sized amount) just inside the vaginal introitus with finger-tip weekly on Monday nights  . Cyanocobalamin (VITAMIN B-12 IJ) Inject 1 mL into the muscle every 30 (thirty) days. Once a day on the 1st of the month  . diltiazem (CARDIZEM) 120 MG tablet Take 120 mg by mouth daily.  . furosemide (LASIX) 40 MG tablet Take 40 mg by mouth daily.   . Hyprom-Naphaz-Polysorb-Zn Sulf (CLEAR EYES COMPLETE) SOLN Place 2 drops into both eyes every 2 (two) hours as needed.  . latanoprost (XALATAN) 0.005 % ophthalmic solution Place 1 drop into both eyes at bedtime. Per Dr. Wallace Going   . Lidocaine HCl (ASPERCREME W/LIDOCAINE) 4 % CREA Apply liberal amount topically  to shoulders/ neck or any other area of pain TID prn for pain/OA  . Melatonin 3 MG TABS Give 2 tablets (6 mg) by mouth at bedtime  . mirabegron ER (MYRBETRIQ) 25 MG TB24 tablet Take 25 mg by mouth daily.  . NON FORMULARY Diet Type: Regular diet. Small portions  . ondansetron (ZOFRAN) 4 MG tablet Take 4 mg by mouth every 6 (six) hours as needed for nausea or vomiting.  . potassium chloride SA (K-DUR,KLOR-CON) 20 MEQ tablet Take 20 mEq by mouth daily.   . rivaroxaban (XARELTO) 20 MG TABS tablet Take 20 mg by mouth daily with supper.  Marland Kitchen rOPINIRole (REQUIP) 0.5 MG tablet Take 1 tablet (0.5 mg total) by mouth at bedtime.   No facility-administered encounter medications on file as of 04/09/2018.     Review of Systems  GENERAL: No change in appetite, no fatigue, no weight changes, no fever, chills or weakness MOUTH and THROAT: Denies oral discomfort, gingival pain or bleeding RESPIRATORY: no cough, SOB, DOE, wheezing, hemoptysis CARDIAC: No chest pain, edema or palpitations GI: No abdominal pain, diarrhea, constipation, heart burn, nausea or vomiting GU: Denies dysuria, frequency, hematuria, incontinence, or discharge NEUROLOGICAL:  Denies dizziness, syncope, numbness, or headache PSYCHIATRIC: Denies feelings of depression or anxiety. No report of hallucinations, insomnia, paranoia, or agitation    Immunization History  Administered Date(s) Administered  . Influenza-Unspecified 10/22/2016, 10/30/2017  . PPD Test 09/29/2017  . Pneumococcal Conjugate-13 11/18/2013  . Pneumococcal Polysaccharide-23 03/06/2018  . Pneumococcal-Unspecified 10/24/2009  . Zoster 10/15/2011   Pertinent  Health Maintenance Due  Topic Date Due  . INFLUENZA VACCINE  Completed  . DEXA SCAN  Completed  . PNA vac Low Risk Adult  Completed    Vitals:   04/09/18 0824  BP: (!) 105/58  Pulse: 68  Resp: 18  Temp: 97.6 F (36.4 C)  TempSrc: Oral  SpO2: 97%  Weight: 120 lb 14.4 oz (54.8 kg)  Height: 5' (1.524 m)   Body mass index is 23.61 kg/m.  Physical  Exam  GENERAL APPEARANCE: Well nourished. In no acute distress. Normal body habitus SKIN:  Skin is warm and dry.  MOUTH and THROAT: Lips are without lesions. Oral mucosa is moist and without lesions. Tongue is normal in shape, size, and color and without lesions RESPIRATORY: Breathing is even & unlabored, BS CTAB CARDIAC: RRR, no murmur,no extra heart sounds, no edema GI: Abdomen soft, normal BS, no masses, no tenderness EXTREMITIES:  Able to move X 4 extremities NEUROLOGICAL: There is no tremor. Speech is clear. Alert and oriented X 3. PSYCHIATRIC:  Affect and behavior are appropriate    Labs reviewed: Recent Labs    07/03/17 1400 08/04/17 0540 12/29/17 1444  NA 139 141 134*  K 3.8 3.7 3.8  CL 104 101 98  CO2 25 31 26   GLUCOSE 122* 83 107*  BUN 15 17 20   CREATININE 0.81 0.63 0.81  CALCIUM 9.6 9.0 9.8  MG  --  2.3  --    Recent Labs    06/26/17 1420 08/04/17 0540 12/29/17 1444  AST 19 17 14*  ALT <5* <5 <5  ALKPHOS 65 63 66  BILITOT 0.5 0.6 0.6  PROT 7.2 6.8 7.5  ALBUMIN 3.6 3.3* 3.9   Recent Labs    06/26/17 1420 08/04/17 0540 12/29/17 1444  WBC 8.1  6.9 8.2  NEUTROABS 4.5 3.6 3.9  HGB 13.0 12.6 13.0  HCT 37.9 36.2 39.9  MCV 94.9 92.5 94.3  PLT 298 311 292   Lab Results  Component Value Date   TSH 1.983 08/04/2017    Assessment/Plan  1. AF (paroxysmal atrial fibrillation) (HCC)/PE (pulmonary thromboembolism) (HCC) -Rate controlled, continue Xarelto 20 mg 1 tab daily and diltiazem 120 mg 1 tab daily  2. Chronic diastolic (congestive) heart failure (HCC) - no SOB, continue Lasix 40 mg 1 tab daily and KCl ER 20 meq 1 tab daily  3. Restless leg syndrome -Stable, continue ropinirole 0.5 mg 1 tab at bedtime  4. OAB (overactive bladder) -Continue Myrbetriq ER 25 mg 1 tab daily  5. Parkinson disease ([place]) - stable, continue carbidopa-levodopa 50-200 mg 3 tabs 3 times a day    I have filled out patient's discharge paperwork.  DME provided: None  Total discharge time: Greater than 30 minutes Greater than 50% was spent in counseling and coordination of care.  Discharge time involved coordination of the discharge process with social worker, nursing staff and therapy department. Medical justification for home health services/DME verified.  Durenda Age, NP [facility] and Adult Medicine [phone] (Monday-Friday 8:00 a.m. - 5:00 p.m.) [phone] (after hours)

## 2018-04-21 ENCOUNTER — Other Ambulatory Visit
Admission: RE | Admit: 2018-04-21 | Discharge: 2018-04-21 | Disposition: A | Discharge status: Home / Self Care | Payer: Medicare Other | Source: Ambulatory Visit | Attending: Adult Health | Admitting: Adult Health

## 2018-04-21 DIAGNOSIS — I5032 Chronic diastolic (congestive) heart failure: Secondary | ICD-10-CM | POA: Insufficient documentation

## 2018-04-21 LAB — CBC WITH DIFFERENTIAL/PLATELET
Abs Immature Granulocytes: 0.02 10*3/uL (ref 0.00–0.07)
Basophils Absolute: 0 10*3/uL (ref 0.0–0.1)
Basophils Relative: 0 %
Eosinophils Absolute: 0.2 10*3/uL (ref 0.0–0.5)
Eosinophils Relative: 3 %
HCT: 37.5 % (ref 36.0–46.0)
Hemoglobin: 12.3 g/dL (ref 12.0–15.0)
Immature Granulocytes: 0 %
Lymphocytes Relative: 33 %
Lymphs Abs: 2.5 10*3/uL (ref 0.7–4.0)
MCH: 31.5 pg (ref 26.0–34.0)
MCHC: 32.8 g/dL (ref 30.0–36.0)
MCV: 96.2 fL (ref 80.0–100.0)
MONO ABS: 0.7 10*3/uL (ref 0.1–1.0)
Monocytes Relative: 10 %
Neutro Abs: 4 10*3/uL (ref 1.7–7.7)
Neutrophils Relative %: 54 %
Platelets: 291 10*3/uL (ref 150–400)
RBC: 3.9 MIL/uL (ref 3.87–5.11)
RDW: 13.7 % (ref 11.5–15.5)
WBC: 7.5 10*3/uL (ref 4.0–10.5)
nRBC: 0 % (ref 0.0–0.2)

## 2018-04-21 LAB — COMPREHENSIVE METABOLIC PANEL
AST: 12 U/L — ABNORMAL LOW (ref 15–41)
Albumin: 3.3 g/dL — ABNORMAL LOW (ref 3.5–5.0)
Alkaline Phosphatase: 56 U/L (ref 38–126)
Anion gap: 8 (ref 5–15)
BILIRUBIN TOTAL: 0.6 mg/dL (ref 0.3–1.2)
BUN: 18 mg/dL (ref 8–23)
CO2: 27 mmol/L (ref 22–32)
Calcium: 8.8 mg/dL — ABNORMAL LOW (ref 8.9–10.3)
Chloride: 104 mmol/L (ref 98–111)
Creatinine, Ser: 0.66 mg/dL (ref 0.44–1.00)
GFR calc Af Amer: >60 mL/min (ref >60)
GFR calc non Af Amer: >60 mL/min (ref >60)
Glucose, Bld: 74 mg/dL (ref 70–99)
Potassium: 3.8 mmol/L (ref 3.5–5.1)
Sodium: 139 mmol/L (ref 135–145)
Total Protein: 6.3 g/dL — ABNORMAL LOW (ref 6.5–8.1)

## 2018-04-21 LAB — VITAMIN B12: Vitamin B-12: 518 pg/mL (ref 180–914)

## 2018-04-21 LAB — TSH: TSH: 2.039 u[IU]/mL (ref 0.350–4.500)

## 2018-04-29 ENCOUNTER — Encounter
Admission: RE | Admit: 2018-04-29 | Discharge: 2018-04-29 | Disposition: A | Discharge status: Home / Self Care | Payer: Medicaid Other | Source: Ambulatory Visit | Attending: Internal Medicine | Admitting: Internal Medicine

## 2018-04-30 ENCOUNTER — Non-Acute Institutional Stay (SKILLED_NURSING_FACILITY): Payer: Medicare Other | Admitting: Adult Health

## 2018-04-30 ENCOUNTER — Encounter: Payer: Self-pay | Admitting: Adult Health

## 2018-04-30 DIAGNOSIS — G2581 Restless legs syndrome: Secondary | ICD-10-CM | POA: Diagnosis not present

## 2018-04-30 DIAGNOSIS — N3281 Overactive bladder: Secondary | ICD-10-CM

## 2018-04-30 DIAGNOSIS — G20 Parkinson's disease: Secondary | ICD-10-CM

## 2018-04-30 DIAGNOSIS — I480 Paroxysmal atrial fibrillation: Secondary | ICD-10-CM | POA: Diagnosis not present

## 2018-04-30 DIAGNOSIS — G4709 Other insomnia: Secondary | ICD-10-CM

## 2018-04-30 DIAGNOSIS — I5032 Chronic diastolic (congestive) heart failure: Secondary | ICD-10-CM

## 2018-04-30 NOTE — Progress Notes (Signed)
[facility] Number: [place] of Service:  SNF [phone]) Provider:  Durenda Age, NP  Patient Care Team: Kirk Ruths, MD as PCP - General (Internal Medicine) Nyoka Cowden Phylis Bougie, NP as Nurse Practitioner (Geriatric Medicine)  Extended Emergency Contact Information Primary Emergency Contact: Newsham,Timothy L Address: [address]          [place], [place] [address] [name] of [place] Phone: [phone] Relation: Son Secondary Emergency Contact: Dahir,James Address: [address]          [place],  [address] [name] of [place] Phone: [phone] Work Phone: [phone] Mobile Phone: [phone] Relation: Son  Code Status:  DNR  Goals of care: Advanced Directive information Advanced Directives 04/30/2018  Does Patient Have a Medical Advance Directive? Yes  Type of Advance Directive Out of facility DNR (pink MOST or yellow form)  Does patient want to make changes to medical advance directive? No - Patient declined  Copy of [place] in Chart? Yes - validated most recent copy scanned in chart (See row information)  Would patient like information on creating a medical advance directive? No - Patient declined  Pre-existing out of facility DNR order (yellow form or pink MOST form) Pink MOST form placed in chart (order not valid for inpatient use)     Chief Complaint  Patient presents with  . Medical Management of Chronic Issues    Rout[facility] visit    HPI:  Pt is a 85 y.o. female seen today for medical management of chronic diseases. She has PMH atrial fibrillation, breast cancer, hypertension, osteoarthritis, Parkinson's disease and PE.  She was seen in the room today.  She was supposed to transfer to another facility family [facility] due to COVID-19 pandemic. She denies any pain. No noted edema nor SOB. She currently takes Lasix  for CHF.   Past Medical History:  Diagnosis Date  . A-fib ([place])    unspecified  . Anemia    unspecified  . Arthritis    osteoarthritis  . Bleeding hemorrhoid   . Breast cancer ([place])    unspecified  . Cancer ([place])    Breast  . Cataract   . Diverticulosis   . DVT (deep vein thrombosis) in pregnancy   . Hiatal hernia   . Hypertension   . Incontinence   . Osteoarthritis   . Osteoporosis    a. Intolerance to Fosamax b. Bilateral foot fractures c. IV Boniva d. Low vitamin D e. Reclast  . Parkinson disease ([place])   . PE (pulmonary thromboembolism) ([place])   . Post-menopausal atrophic vaginitis 01/28/2017  . Scleritis    Idiopathic a. Prednisone b. s/p methotrexate  . TIA (transient ischemic attack) 1997   Possible  . Urinary urgency   . Vitamin B12 deficiency    Past Surgical History:  Procedure Laterality Date  . ABDOMINAL HYSTERECTOMY    . ABDOMINAL HYSTERECTOMY     partial  . BLADDER TACK SURGERY     Dr. Ouida Sills  . BREAST BIOPSY Left 10/2012   benign  . BREAST BIOPSY Left 11/22/2015   invasive mammary ca   . BREAST EXCISIONAL BIOPSY Left 12/08/2015   lumpectomy  . BREAST LUMPECTOMY Left 11/2015   Invasive mammary carcinoma Grade I  . EYE SURGERY Bilateral    Catarct Extraction with IOL  . FINGER SURGERY Right   . FOOT FRACTURE SURGERY Right 06/2003  pinning, Dr. Francia Greaves, [facility]  . FOOT SURGERY Left   . INCONTINENCE SURGERY    . NEUROMA SURGERY    . PACEMAKER INSERTION Right 10/02/2016   Procedure: INSERTION PACEMAKER;  Surgeon: Isaias Cowman, MD;  Location: [facility] ORS;  Service: Cardiovascular;  Laterality: Right;  . PARTIAL MASTECTOMY WITH AXILLARY SENTINEL LYMPH NODE BIOPSY Left 12/08/2015   Procedure: PARTIAL MASTECTOMY WITH AXILLARY SENTINEL LYMPH NODE BIOPSY;  Surgeon: [name], MD;  Location: [facility] ORS;  Service: General;  Laterality: Left;    No Known Allergies  Outpatient Encounter Medications as of 04/30/2018  Medication Sig  .  acetaminophen (TYLENOL) 325 MG tablet Take 650 mg by mouth every 6 (six) hours as needed.   . calcium carbonate (OSCAL) 1500 (600 Ca) MG TABS tablet Take 2 tablets by mouth daily.   . carbidopa-levodopa (SINEMET CR) 50-200 MG tablet Take 3 tablets by mouth 3 (three) times daily.  Marland Kitchen conjugated estrogens (PREMARIN) vaginal cream Apply 0.5 mg ( pea-sized amount) just inside the vaginal introitus with finger-tip weekly on Monday nights  . Cyanocobalamin (VITAMIN B-12 IJ) Inject 1 mL into the muscle every 30 (thirty) days. Once a day on the 1st of the month  . diltiazem (CARDIZEM) 120 MG tablet Take 120 mg by mouth daily.  . furosemide (LASIX) 40 MG tablet Take 40 mg by mouth daily.   . Hyprom-Naphaz-Polysorb-Zn Sulf (CLEAR EYES COMPLETE) SOLN Place 2 drops into both eyes every 2 (two) hours as needed.  . latanoprost (XALATAN) 0.005 % ophthalmic solution Place 1 drop into both eyes at bedtime. Per Dr. Wallace Going   . Lidocaine HCl (ASPERCREME W/LIDOCAINE) 4 % CREA Apply liberal amount topically  to shoulders/ neck or any other area of pain TID prn for pain/OA  . Melatonin 3 MG TABS Give 2 tablets (6 mg) by mouth at bedtime  . mirabegron ER (MYRBETRIQ) 25 MG TB24 tablet Take 25 mg by mouth daily.  . NON FORMULARY Diet Type: Regular diet. Small portions  . ondansetron (ZOFRAN) 4 MG tablet Take 4 mg by mouth every 6 (six) hours as needed for nausea or vomiting.  . potassium chloride SA (K-DUR,KLOR-CON) 20 MEQ tablet Take 20 mEq by mouth daily.   . rivaroxaban (XARELTO) 20 MG TABS tablet Take 20 mg by mouth daily with supper.  Marland Kitchen rOPINIRole (REQUIP) 0.5 MG tablet Take 1 tablet (0.5 mg total) by mouth at bedtime.   No facility-administered encounter medications on file as of 04/30/2018.     Review of Systems  GENERAL: No change in appetite, no fatigue, no weight changes, no fever, chills or weakness MOUTH and THROAT: Denies oral discomfort, gingival pain or bleeding RESPIRATORY: no cough, SOB, DOE,  wheezing, hemoptysis CARDIAC: No chest pain, edema or palpitations GI: No abdominal pain, diarrhea, constipation, heart burn, nausea or vomiting NEUROLOGICAL: Denies dizziness, syncope, numbness, or headache PSYCHIATRIC: Denies feelings of depression or anxiety. No report of hallucinations, insomnia, paranoia, or agitation     Immunization History  Administered Date(s) Administered  . Influenza-Unspecified 10/22/2016, 10/30/2017  . PPD Test 09/29/2017  . Pneumococcal Conjugate-13 11/18/2013  . Pneumococcal Polysaccharide-23 03/06/2018  . Pneumococcal-Unspecified 10/24/2009  . Zoster 10/15/2011   Pertinent  Health Maintenance Due  Topic Date Due  . INFLUENZA VACCINE  08/29/2018  . DEXA SCAN  Completed  . PNA vac Low Risk Adult  Completed   No flowsheet data found.   Vitals:   04/30/18 0837  BP: 114/62  Pulse: 100  Resp: 17  Temp: 97.9 F (  36.6 C)  SpO2: 99%  Weight: 117 lb 12.8 oz (53.4 kg)  Height: 5' (1.524 m)   Body mass index is 23.01 kg/m.  Physical Exam  GENERAL APPEARANCE: Well nourished. In no acute distress. Normal body habitus SKIN:  Skin is warm and dry.  MOUTH and THROAT: Lips are without lesions. Oral mucosa is moist and without lesions. Tongue is normal in shape, size, and color and without lesions RESPIRATORY: Breathing is even & unlabored, BS CTAB CARDIAC: Irregularly irregular, no murmur,no extra heart sounds, no edema GI: Abdomen soft, normal BS, no masses, no tenderness EXTREMITIES:  Able to move X 4 extremities NEUROLOGICAL: + fine tremor. Speech is clear. Alert and oriented X 3. PSYCHIATRIC:  Affect and behavior are appropriate  Labs reviewed: Recent Labs    08/04/17 0540 12/29/17 1444 04/21/18 0614  NA 141 134* 139  K 3.7 3.8 3.8  CL 101 98 104  CO2 31 26 27   GLUCOSE 83 107* 74  BUN 17 20 18   CREATININE 0.63 0.81 0.66  CALCIUM 9.0 9.8 8.8*  MG 2.3  --   --    Recent Labs    08/04/17 0540 12/29/17 1444 04/21/18 0614  AST 17  14* 12*  ALT <5 <5 <5  ALKPHOS 63 66 56  BILITOT 0.6 0.6 0.6  PROT 6.8 7.5 6.3*  ALBUMIN 3.3* 3.9 3.3*   Recent Labs    08/04/17 0540 12/29/17 1444 04/21/18 0614  WBC 6.9 8.2 7.5  NEUTROABS 3.6 3.9 4.0  HGB 12.6 13.0 12.3  HCT 36.2 39.9 37.5  MCV 92.5 94.3 96.2  PLT 311 292 291   Lab Results  Component Value Date   TSH 2.039 04/21/2018     Assessment/Plan  1. AF (paroxysmal atrial fibrillation) (HCC) -Rate controlled, continue diltiazem 120 mg 1 tab daily and Xarelto 20 mg 1 tab daily  2. Restless leg syndrome -Stable, continue ropinirole 0.5 mg 1 tab at bedtime  3. Overactive bladder -Continue Myrbetriq ER 25 mg 1 tab daily  4. Parkinson disease ([place]) -Noted to have fine tremors on her bilateral hands, continue carbidopa-levodopa 50-200 mg 3 tabs 3 times a day  5. Chronic diastolic (congestive) heart failure (HCC) -No SOB, continue Lasix 40 mg 1 tab daily and KCl ER 20 meq 1 tab daily 6  6. Other insomnia -Continue melatonin 3 mg 2 tabs at bedtime    Family/ staff Communication: Discussed plan of care with resident.  Labs/tests ordered: None  Goals of care: Long-term care   Durenda Age, NP [facility] and Adult Medicine [phone] (Monday-Friday 8:00 a.m. - 5:00 p.m.) [phone] (after hours)

## 2018-05-23 ENCOUNTER — Other Ambulatory Visit
Admission: RE | Admit: 2018-05-23 | Discharge: 2018-05-23 | Disposition: A | Discharge status: Home / Self Care | Payer: Medicare Other | Source: Skilled Nursing Facility | Attending: Internal Medicine | Admitting: Internal Medicine

## 2018-05-23 DIAGNOSIS — R350 Frequency of micturition: Secondary | ICD-10-CM | POA: Insufficient documentation

## 2018-05-23 LAB — URINALYSIS, COMPLETE (UACMP) WITH MICROSCOPIC
Bilirubin Urine: NEGATIVE
Glucose, UA: NEGATIVE mg/dL
Hgb urine dipstick: NEGATIVE
Ketones, ur: 5 mg/dL — AB
Nitrite: NEGATIVE
Protein, ur: NEGATIVE mg/dL
Specific Gravity, Urine: 1.016 (ref 1.005–1.030)
WBC, UA: >50 WBC/hpf — ABNORMAL HIGH (ref 0–5)
pH: 5 (ref 5.0–8.0)

## 2018-05-26 LAB — URINE CULTURE: Culture: >=100000 — AB

## 2018-05-29 ENCOUNTER — Encounter
Admission: RE | Admit: 2018-05-29 | Discharge: 2018-05-29 | Disposition: A | Discharge status: Home / Self Care | Payer: Medicaid Other | Source: Ambulatory Visit | Attending: Internal Medicine | Admitting: Internal Medicine

## 2018-06-01 ENCOUNTER — Non-Acute Institutional Stay (SKILLED_NURSING_FACILITY): Payer: Medicare Other | Admitting: Adult Health

## 2018-06-01 ENCOUNTER — Encounter: Payer: Self-pay | Admitting: Adult Health

## 2018-06-01 DIAGNOSIS — N390 Urinary tract infection, site not specified: Secondary | ICD-10-CM | POA: Diagnosis not present

## 2018-06-01 DIAGNOSIS — Z86711 Personal history of pulmonary embolism: Secondary | ICD-10-CM | POA: Diagnosis not present

## 2018-06-01 DIAGNOSIS — I5032 Chronic diastolic (congestive) heart failure: Secondary | ICD-10-CM | POA: Diagnosis not present

## 2018-06-01 DIAGNOSIS — G20 Parkinson's disease: Secondary | ICD-10-CM

## 2018-06-01 DIAGNOSIS — G2581 Restless legs syndrome: Secondary | ICD-10-CM | POA: Diagnosis not present

## 2018-06-01 DIAGNOSIS — I480 Paroxysmal atrial fibrillation: Secondary | ICD-10-CM

## 2018-06-01 NOTE — Progress Notes (Signed)
Location:  [facility] Number: [id number] Place of Service:  SNF (31) Provider:  Durenda Age, DNP  Patient Care Team: Kirk Ruths, MD as PCP - General (Internal Medicine) Nyoka Cowden Phylis Bougie, NP as Nurse Practitioner (Geriatric Medicine)  Extended Emergency Contact Information Primary Emergency Contact: Dykstra,Timothy L Address: [address]          [place], [place] [address] [name] of [place] Phone: [phone] Relation: Son Secondary Emergency Contact: Waldvogel,James Address: [address]          [place], [place] [address] [name] of [place] Phone: [phone] Work Phone: [phone] Mobile Phone: [phone] Relation: Son  Code Status:  DNR  Goals of care: Advanced Directive information Advanced Directives 04/30/2018  Does Patient Have a Medical Advance Directive? Yes  Type of Advance Directive Out of facility DNR (pink MOST or yellow form)  Does patient want to make changes to medical advance directive? No - Patient declined  Copy of [place] in Chart? Yes - validated most recent copy scanned in chart (See row information)  Would patient like information on creating a medical advance directive? No - Patient declined  Pre-existing out of facility DNR order (yellow form or pink MOST form) Pink MOST form placed in chart (order not valid for inpatient use)     Chief Complaint  Patient presents with  . Medical Management of Chronic Issues    Routine [facility] SNF visit    HPI:  Pt is a 85 y.o. female seen today for medical management of chronic diseases.  She is a long-term care resident of [facility]. She has a PMH of Parkinson's disease, atrial fibrillation, breast cancer, HTN, osteoarthritis, and PE. She was recently started on Bactrim DS for UTI. Urine culture showed >100,000 CFU/ml Klebsiella pneumoniae. She verbalized sleeping well at night.    Past Medical History:  Diagnosis Date  .  A-fib ([place])    unspecified  . Anemia    unspecified  . Arthritis    osteoarthritis  . Bleeding hemorrhoid   . Breast cancer ([place])    unspecified  . Cancer ([place])    Breast  . Cataract   . Diverticulosis   . DVT (deep vein thrombosis) in pregnancy   . Hiatal hernia   . Hypertension   . Incontinence   . Osteoarthritis   . Osteoporosis    a. Intolerance to Fosamax b. Bilateral foot fractures c. IV Boniva d. Low vitamin D e. Reclast  . Parkinson disease ([place])   . PE (pulmonary thromboembolism) ([place])   . Post-menopausal atrophic vaginitis 01/28/2017  . Scleritis    Idiopathic a. Prednisone b. s/p methotrexate  . TIA (transient ischemic attack) 1997   Possible  . Urinary urgency   . Vitamin B12 deficiency    Past Surgical History:  Procedure Laterality Date  . ABDOMINAL HYSTERECTOMY    . ABDOMINAL HYSTERECTOMY     partial  . BLADDER TACK SURGERY     Dr. Ouida Sills  . BREAST BIOPSY Left 10/2012   benign  . BREAST BIOPSY Left 11/22/2015   invasive mammary ca   . BREAST EXCISIONAL BIOPSY Left 12/08/2015   lumpectomy  . BREAST LUMPECTOMY Left 11/2015   Invasive mammary carcinoma Grade I  . EYE SURGERY Bilateral    Catarct Extraction with IOL  . FINGER SURGERY Right   . FOOT FRACTURE SURGERY Right 06/2003   pinning, Dr. Francia Greaves, [facility]  . FOOT SURGERY Left   .  INCONTINENCE SURGERY    . NEUROMA SURGERY    . PACEMAKER INSERTION Right 10/02/2016   Procedure: INSERTION PACEMAKER;  Surgeon: Isaias Cowman, MD;  Location: [facility] ORS;  Service: Cardiovascular;  Laterality: Right;  . PARTIAL MASTECTOMY WITH AXILLARY SENTINEL LYMPH NODE BIOPSY Left 12/08/2015   Procedure: PARTIAL MASTECTOMY WITH AXILLARY SENTINEL LYMPH NODE BIOPSY;  Surgeon: [name], MD;  Location: [facility] ORS;  Service: General;  Laterality: Left;    No Known Allergies  Outpatient Encounter Medications as of 06/01/2018  Medication Sig  . acetaminophen (TYLENOL) 325 MG tablet Take 650 mg by mouth every  6 (six) hours as needed.   . calcium carbonate (OSCAL) 1500 (600 Ca) MG TABS tablet Take 2 tablets by mouth daily.   . carbidopa-levodopa (SINEMET CR) 50-200 MG tablet Take 3 tablets by mouth 3 (three) times daily.  Marland Kitchen conjugated estrogens (PREMARIN) vaginal cream Apply 0.5 mg ( pea-sized amount) just inside the vaginal introitus with finger-tip weekly on Monday nights  . Cyanocobalamin (VITAMIN B-12 IJ) Inject 1 mL into the muscle every 30 (thirty) days. Once a day on the 1st of the month  . diltiazem (CARDIZEM) 120 MG tablet Take 120 mg by mouth daily.  . furosemide (LASIX) 40 MG tablet Take 40 mg by mouth daily.   . Hyprom-Naphaz-Polysorb-Zn Sulf (CLEAR EYES COMPLETE) SOLN Place 2 drops into both eyes every 2 (two) hours as needed.  . latanoprost (XALATAN) 0.005 % ophthalmic solution Place 1 drop into both eyes at bedtime. Per Dr. Wallace Going   . Lidocaine HCl (ASPERCREME W/LIDOCAINE) 4 % CREA Apply liberal amount topically  to shoulders/ neck or any other area of pain TID prn for pain/OA  . Melatonin 3 MG TABS Give 2 tablets (6 mg) by mouth at bedtime  . mirabegron ER (MYRBETRIQ) 25 MG TB24 tablet Take 25 mg by mouth daily.  . NON FORMULARY Diet Type: Regular diet. Small portions  . ondansetron (ZOFRAN) 4 MG tablet Take 4 mg by mouth every 6 (six) hours as needed for nausea or vomiting.  . potassium chloride SA (K-DUR,KLOR-CON) 20 MEQ tablet Take 20 mEq by mouth daily.   . rivaroxaban (XARELTO) 20 MG TABS tablet Take 20 mg by mouth daily with supper.   Marland Kitchen rOPINIRole (REQUIP) 0.5 MG tablet Take 1 tablet (0.5 mg total) by mouth at bedtime.  . sulfamethoxazole-trimethoprim (BACTRIM DS) 800-160 MG tablet Take 1 tablet by mouth 2 (two) times daily.   No facility-administered encounter medications on file as of 06/01/2018.     Review of Systems  GENERAL: No change in appetite, no fatigue, no weight changes, no fever, chills or weakness MOUTH and THROAT: Denies oral discomfort, gingival pain or  bleeding, pain from teeth or hoarseness   RESPIRATORY: no cough, SOB, DOE, wheezing, hemoptysis CARDIAC: No chest pain, edema or palpitations GI: No abdominal pain, diarrhea, constipation, heart burn, nausea or vomiting GU: Denies dysuria, frequency, hematuria, incontinence, or discharge NEUROLOGICAL: Denies dizziness, syncope, numbness, or headache PSYCHIATRIC: Denies feelings of depression or anxiety. No report of hallucinations, insomnia, paranoia, or agitation    Immunization History  Administered Date(s) Administered  . Influenza-Unspecified 10/22/2016, 10/30/2017  . PPD Test 09/29/2017  . Pneumococcal Conjugate-13 11/18/2013  . Pneumococcal Polysaccharide-23 03/06/2018  . Pneumococcal-Unspecified 10/24/2009  . Zoster 10/15/2011   Pertinent  Health Maintenance Due  Topic Date Due  . INFLUENZA VACCINE  08/29/2018  . DEXA SCAN  Completed  . PNA vac Low Risk Adult  Completed    Vitals:  06/01/18 1440  BP: 104/68  Pulse: 94  Resp: 16  Temp: 97.6 F (36.4 C)  TempSrc: Oral  SpO2: 96%  Weight: 114 lb 4.8 oz (51.8 kg)  Height: 5' (1.524 m)   Body mass index is 22.32 kg/m.  Physical Exam  GENERAL APPEARANCE: Well nourished. In no acute distress. Normal body habitus SKIN:  Skin is warm and dry.  MOUTH and THROAT: Lips are without lesions. Oral mucosa is moist and without lesions. Tongue is normal in shape, size, and color and without lesions RESPIRATORY: Breathing is even & unlabored, BS CTAB CARDIAC: RRR, no murmur,no extra heart sounds, no edema, right chest pacemaker GI: Abdomen soft, normal BS, no masses, no tenderness EXTREMITIES:  Able to move X 4 extremities NEUROLOGICAL: + tremor. Speech is clear.  Alert and oriented X 3 PSYCHIATRIC:  Affect and behavior are appropriate   Labs reviewed: Recent Labs    08/04/17 0540 12/29/17 1444 04/21/18 0614  NA 141 134* 139  K 3.7 3.8 3.8  CL 101 98 104  CO2 31 26 27   GLUCOSE 83 107* 74  BUN 17 20 18    CREATININE 0.63 0.81 0.66  CALCIUM 9.0 9.8 8.8*  MG 2.3  --   --    Recent Labs    08/04/17 0540 12/29/17 1444 04/21/18 0614  AST 17 14* 12*  ALT <5 <5 <5  ALKPHOS 63 66 56  BILITOT 0.6 0.6 0.6  PROT 6.8 7.5 6.3*  ALBUMIN 3.3* 3.9 3.3*   Recent Labs    08/04/17 0540 12/29/17 1444 04/21/18 0614  WBC 6.9 8.2 7.5  NEUTROABS 3.6 3.9 4.0  HGB 12.6 13.0 12.3  HCT 36.2 39.9 37.5  MCV 92.5 94.3 96.2  PLT 311 292 291   Lab Results  Component Value Date   TSH 2.039 04/21/2018    Assessment/Plan  1. Urinary tract infection without hematuria, site unspecified -Dysuria better, continue Bactrim DS 1 tab twice daily for total of 7 days  2. Restless leg syndrome -Stable, continue ropinirole 0.5 mg 1 tablet at bedtime  3. History of pulmonary embolism -No SOB, continue Xarelto 20 mg 1 tab daily  4. Chronic diastolic (congestive) heart failure (HCC) -No SOB, continue Lasix 40 mg 1 tab daily  5. Parkinson disease ([place]) -Stable, continue carbidopa-levodopa ER 50-200 mg 3 tabs 3 times a day  6.  Atrial fibrillation -Rate control, continue diltiazem 120 mg 1 tab daily and Xarelto 20 mg 1 tab daily    Family/ staff Communication: Discussed plan of care with resident.  Labs/tests ordered: None  Goals of care:   Long-term care.   Durenda Age, [facility] [phone] (Monday-Friday 8:00 a.m. - 5:00 p.m.) [phone] (after hours)

## 2018-06-19 ENCOUNTER — Telehealth: Payer: Self-pay | Admitting: *Deleted

## 2018-06-19 NOTE — Telephone Encounter (Signed)
Patient's son Jaymie Mckiddy called to R/S her 07/01/18  lab/MD/Prolia INJ.  He stated she was in a facility and they were currently on lockdown.  He said that he's not sure when they will allow pts to leave.He request that appts be moved out to the end of June.  Appts were rescheduled to 07/24/18.

## 2018-06-29 ENCOUNTER — Non-Acute Institutional Stay (SKILLED_NURSING_FACILITY): Payer: Medicare Other | Admitting: Adult Health

## 2018-06-29 ENCOUNTER — Encounter: Payer: Self-pay | Admitting: Adult Health

## 2018-06-29 ENCOUNTER — Encounter
Admission: RE | Admit: 2018-06-29 | Discharge: 2018-06-29 | Disposition: A | Discharge status: Home / Self Care | Payer: Medicaid Other | Source: Ambulatory Visit | Attending: Internal Medicine | Admitting: Internal Medicine

## 2018-06-29 DIAGNOSIS — I5032 Chronic diastolic (congestive) heart failure: Secondary | ICD-10-CM

## 2018-06-29 DIAGNOSIS — I480 Paroxysmal atrial fibrillation: Secondary | ICD-10-CM | POA: Diagnosis not present

## 2018-06-29 DIAGNOSIS — G4709 Other insomnia: Secondary | ICD-10-CM

## 2018-06-29 DIAGNOSIS — G2581 Restless legs syndrome: Secondary | ICD-10-CM

## 2018-06-29 DIAGNOSIS — G20 Parkinson's disease: Secondary | ICD-10-CM | POA: Diagnosis not present

## 2018-06-29 DIAGNOSIS — N3281 Overactive bladder: Secondary | ICD-10-CM

## 2018-06-29 NOTE — Progress Notes (Signed)
Location:  [facility] Number: [place]:  SNF ([phone]) Provider:  Durenda Age, [place], FNP-BC  Patient Care Team: Kirk Ruths, MD as PCP - General (Internal Medicine) Nyoka Cowden Phylis Bougie, NP as Nurse Practitioner (Geriatric Medicine)  Extended Emergency Contact Information Primary Emergency Contact: Keng,Timothy L Address: [address]          [place], [place] [address] [name] of [place] Phone: [phone] Relation: Son Secondary Emergency Contact: Dorantes,James Address: [address]          [place], [place] [address] [name] of [place] Phone: [phone] Work Phone: [phone] Mobile Phone: [phone] Relation: Son  Code Status:  DNR  Goals of care: Advanced Directive information Advanced Directives 06/29/2018  Does Patient Have a Medical Advance Directive? Yes  Type of Advance Directive Out of facility DNR (pink MOST or yellow form);[place];Living will  Does patient want to make changes to medical advance directive? No - Patient declined  Copy of [place] in Chart? Yes - validated most recent copy scanned in chart (See row information)  Would patient like information on creating a medical advance directive? -  Pre-existing out of facility DNR order (yellow form or pink MOST form) Yellow form placed in chart (order not valid for inpatient use);Pink MOST form placed in chart (order not valid for inpatient use)     Chief Complaint  Patient presents with   Medical Management of Chronic Issues    Routine Visit    HPI:  Pt is a 85 y.o. female seen today for medical management of chronic diseases.  She has PMH of Parkinson's disease, atrial fibrillation, breast cancer, hypertension, osteoarthritis and PE. She was seen today in her room. She denies having SOB nor chest pain. She said that her RLS does not bother her at night. Her tremors can only be noted  when she holds a paper and then closes her eyes. She continues to take Sinemet for Parkinson's disease.   Past Medical History:  Diagnosis Date   A-fib ([place])    unspecified   Anemia    unspecified   Arthritis    osteoarthritis   Bleeding hemorrhoid    Breast cancer (HCC)    unspecified   Cancer ([place])    Breast   Cataract    Diverticulosis    DVT (deep vein thrombosis) in pregnancy    Hiatal hernia    Hypertension    Incontinence    Osteoarthritis    Osteoporosis    a. Intolerance to Fosamax b. Bilateral foot fractures c. IV Boniva d. Low vitamin D e. Reclast   Parkinson disease ([place])    PE (pulmonary thromboembolism) ([place])    Post-menopausal atrophic vaginitis 01/28/2017   Scleritis    Idiopathic a. Prednisone b. s/p methotrexate   TIA (transient ischemic attack) 1997   Possible   Urinary urgency    Vitamin B12 deficiency    Past Surgical History:  Procedure Laterality Date   ABDOMINAL HYSTERECTOMY     ABDOMINAL HYSTERECTOMY     partial   BLADDER TACK SURGERY     Dr. Ouida Sills   BREAST BIOPSY Left 10/2012   benign   BREAST BIOPSY Left 11/22/2015   invasive mammary ca    BREAST EXCISIONAL BIOPSY Left 12/08/2015   lumpectomy   BREAST LUMPECTOMY Left 11/2015   Invasive mammary carcinoma Grade I   EYE SURGERY Bilateral    Catarct Extraction with IOL  FINGER SURGERY Right    FOOT FRACTURE SURGERY Right 06/2003   pinning, Dr. Francia Greaves, [place] Left    INCONTINENCE SURGERY     NEUROMA SURGERY     PACEMAKER INSERTION Right 10/02/2016   Procedure: INSERTION PACEMAKER;  Surgeon: Isaias Cowman, MD;  Location: [facility] ORS;  Service: Cardiovascular;  Laterality: Right;   PARTIAL MASTECTOMY WITH AXILLARY SENTINEL LYMPH NODE BIOPSY Left 12/08/2015   Procedure: PARTIAL MASTECTOMY WITH AXILLARY SENTINEL LYMPH NODE BIOPSY;  Surgeon: [name], MD;  Location: [facility] ORS;  Service: General;  Laterality: Left;    No  Known Allergies  Outpatient Encounter Medications as of 06/29/2018  Medication Sig   acetaminophen (TYLENOL) 325 MG tablet Take 650 mg by mouth every 6 (six) hours as needed.    bisacodyl (DULCOLAX) 10 MG suppository Place 10 mg rectally daily as needed for moderate constipation.   calcium carbonate (OSCAL) 1500 (600 Ca) MG TABS tablet Take 2 tablets by mouth daily.    carbidopa-levodopa (SINEMET CR) 50-200 MG tablet Take 3 tablets by mouth 3 (three) times daily.   conjugated estrogens (PREMARIN) vaginal cream Apply 0.5 mg ( pea-sized amount) just inside the vaginal introitus with finger-tip weekly on Monday nights   Cyanocobalamin (VITAMIN B-12 IJ) Inject 1 mL into the muscle every 30 (thirty) days. Once a day on the 1st of the month   diltiazem (CARDIZEM) 120 MG tablet Take 120 mg by mouth daily.   furosemide (LASIX) 40 MG tablet Take 40 mg by mouth daily.    Hyprom-Naphaz-Polysorb-Zn Sulf (CLEAR EYES COMPLETE) SOLN Place 2 drops into both eyes every 2 (two) hours as needed.   latanoprost (XALATAN) 0.005 % ophthalmic solution Place 1 drop into both eyes at bedtime. Per Dr. Wallace Going    Lidocaine HCl (ASPERCREME W/LIDOCAINE) 4 % CREA Apply liberal amount topically  to shoulders/ neck or any other area of pain TID prn for pain/OA   Melatonin 3 MG TABS Give 2 tablets (6 mg) by mouth at bedtime   mirabegron ER (MYRBETRIQ) 25 MG TB24 tablet Take 25 mg by mouth daily.   NON FORMULARY Diet Type: Regular diet. Small portions   ondansetron (ZOFRAN) 4 MG tablet Take 4 mg by mouth every 6 (six) hours as needed for nausea or vomiting.   potassium chloride SA (K-DUR,KLOR-CON) 20 MEQ tablet Take 20 mEq by mouth daily.    rivaroxaban (XARELTO) 20 MG TABS tablet Take 20 mg by mouth daily with supper.    rOPINIRole (REQUIP) 0.5 MG tablet Take 1 tablet (0.5 mg total) by mouth at bedtime.   No facility-administered encounter medications on file as of 06/29/2018.     Review of  Systems  GENERAL: No change in appetite, no fatigue, no weight changes, no fever, chills or weakness MOUTH and THROAT: Denies oral discomfort, gingival pain or bleeding RESPIRATORY: no cough, SOB, DOE, wheezing, hemoptysis CARDIAC: No chest pain, edema or palpitations GI: No abdominal pain, diarrhea, constipation, heart burn, nausea or vomiting GU: Denies dysuria, frequency, hematuria, incontinence, or discharge NEUROLOGICAL: Denies dizziness, syncope, numbness, or headache PSYCHIATRIC: Denies feelings of depression or anxiety. No report of hallucinations, insomnia, paranoia, or agitation    Immunization History  Administered Date(s) Administered   Influenza-Unspecified 10/22/2016, 10/30/2017   PPD Test 09/29/2017   Pneumococcal Conjugate-13 11/18/2013   Pneumococcal Polysaccharide-23 03/06/2018   Pneumococcal-Unspecified 10/24/2009   Zoster 10/15/2011   Pertinent  Health Maintenance Due  Topic Date Due   INFLUENZA VACCINE  08/29/2018   DEXA  SCAN  Completed   PNA vac Low Risk Adult  Completed   No flowsheet data found.   Vitals:   06/29/18 0903  BP: 100/63  Pulse: 93  Resp: 20  Temp: 97.8 F (36.6 C)  TempSrc: Oral  SpO2: 93%  Weight: 116 lb 12.8 oz (53 kg)  Height: 5' (1.524 m)   Body mass index is 22.81 kg/m.  Physical Exam  GENERAL APPEARANCE: Well nourished. In no acute distress. Normal body habitus SKIN:  Skin is warm and dry.  MOUTH and THROAT: Lips are without lesions. Oral mucosa is moist and without lesions. Tongue is normal in shape, size, and color and without lesions RESPIRATORY: Breathing is even & unlabored, BS CTAB CARDIAC: RRR, no murmur,no extra heart sounds, no edema, right chest pacemaker GI: Abdomen soft, normal BS, no masses, no tenderness EXTREMITIES:  Able to move X 4 extremities NEUROLOGICAL: Speech is clear. Alert and oriented X 3. PSYCHIATRIC:  Affect and behavior are appropriate  Labs reviewed: Recent Labs     08/04/17 0540 12/29/17 1444 04/21/18 0614  NA 141 134* 139  K 3.7 3.8 3.8  CL 101 98 104  CO2 31 26 27   GLUCOSE 83 107* 74  BUN 17 20 18   CREATININE 0.63 0.81 0.66  CALCIUM 9.0 9.8 8.8*  MG 2.3  --   --    Recent Labs    08/04/17 0540 12/29/17 1444 04/21/18 0614  AST 17 14* 12*  ALT <5 <5 <5  ALKPHOS 63 66 56  BILITOT 0.6 0.6 0.6  PROT 6.8 7.5 6.3*  ALBUMIN 3.3* 3.9 3.3*   Recent Labs    08/04/17 0540 12/29/17 1444 04/21/18 0614  WBC 6.9 8.2 7.5  NEUTROABS 3.6 3.9 4.0  HGB 12.6 13.0 12.3  HCT 36.2 39.9 37.5  MCV 92.5 94.3 96.2  PLT 311 292 291   Lab Results  Component Value Date   TSH 2.039 04/21/2018     Assessment/Plan  1. Restless leg syndrome -Stable, continue ropinirole 0.5 mg 1 tab at bedtime  2. Chronic diastolic (congestive) heart failure (HCC) -No SOB, continue Lasix 40 mg 1 tab daily and KCl ER 20 meq 1 tab daily  3. AF (paroxysmal atrial fibrillation) (HCC) -Rate controlled, continue diltiazem 130 mg 1 tab daily, Xarelto 20 mg 1 tab daily  4. Parkinson disease ([place]) -Stable, continue carbidopa-levodopa ER 50-200 mg 3 tabs 3 times daily  5. Overactive bladder -Stable, continue Myrbetriq ER 25 mg 1 tab daily  6. Other insomnia -Verbalized is sleeping adequately at night, continue melatonin 3 mg give 2 tabs = 6 mg at bedtime    Family/ staff Communication: Discussed plan of care with resident.  Labs/tests ordered:  None  Goals of care: Long-term care   Durenda Age, DNP, FNP-BC [facility] [phone] (Monday-Friday 8:00 a.m. - 5:00 p.m.) [phone] (after hours)

## 2018-07-01 ENCOUNTER — Other Ambulatory Visit: Payer: Medicare Other

## 2018-07-01 ENCOUNTER — Ambulatory Visit: Payer: Medicare Other

## 2018-07-01 ENCOUNTER — Ambulatory Visit: Payer: Medicare Other | Admitting: Oncology

## 2018-07-24 ENCOUNTER — Ambulatory Visit: Payer: Medicare Other | Admitting: Oncology

## 2018-07-24 ENCOUNTER — Other Ambulatory Visit: Payer: Medicare Other

## 2018-07-24 ENCOUNTER — Ambulatory Visit: Payer: Medicare Other

## 2018-07-29 ENCOUNTER — Encounter
Admission: RE | Admit: 2018-07-29 | Discharge: 2018-07-29 | Disposition: A | Discharge status: Home / Self Care | Payer: Medicare Other | Source: Ambulatory Visit | Attending: Internal Medicine | Admitting: Internal Medicine

## 2018-08-04 ENCOUNTER — Other Ambulatory Visit
Admission: RE | Admit: 2018-08-04 | Discharge: 2018-08-04 | Disposition: A | Discharge status: Home / Self Care | Payer: Medicare Other | Source: Ambulatory Visit | Attending: Internal Medicine | Admitting: Internal Medicine

## 2018-08-04 ENCOUNTER — Other Ambulatory Visit: Payer: Self-pay

## 2018-08-04 DIAGNOSIS — Z1159 Encounter for screening for other viral diseases: Secondary | ICD-10-CM | POA: Diagnosis not present

## 2018-08-04 DIAGNOSIS — Z01812 Encounter for preprocedural laboratory examination: Secondary | ICD-10-CM | POA: Insufficient documentation

## 2018-08-04 LAB — SARS CORONAVIRUS 2 BY RT PCR (HOSPITAL ORDER, PERFORMED IN ~~LOC~~ HOSPITAL LAB): SARS Coronavirus 2: NEGATIVE

## 2018-09-25 ENCOUNTER — Inpatient Hospital Stay: Payer: Medicare Other

## 2018-09-25 ENCOUNTER — Inpatient Hospital Stay: Payer: Medicare Other | Admitting: Oncology

## 2018-10-16 ENCOUNTER — Other Ambulatory Visit: Payer: Self-pay | Admitting: General Surgery

## 2018-10-16 DIAGNOSIS — Z853 Personal history of malignant neoplasm of breast: Secondary | ICD-10-CM

## 2018-11-16 ENCOUNTER — Other Ambulatory Visit: Payer: Medicare Other

## 2018-11-27 ENCOUNTER — Other Ambulatory Visit: Payer: Self-pay

## 2018-11-27 NOTE — Progress Notes (Signed)
Patient pre screened for office appointment patient unable to answer phone son provided all information. Son is accompanying mother to visit on Monday.

## 2018-11-30 ENCOUNTER — Inpatient Hospital Stay (HOSPITAL_BASED_OUTPATIENT_CLINIC_OR_DEPARTMENT_OTHER): Payer: Medicare Other | Admitting: Oncology

## 2018-11-30 ENCOUNTER — Inpatient Hospital Stay: Payer: Medicare Other | Attending: Oncology

## 2018-11-30 ENCOUNTER — Encounter: Payer: Self-pay | Admitting: Oncology

## 2018-11-30 ENCOUNTER — Inpatient Hospital Stay: Payer: Medicare Other

## 2018-11-30 ENCOUNTER — Other Ambulatory Visit: Payer: Self-pay

## 2018-11-30 VITALS — BP 127/72 | HR 82 | Temp 97.1°F | Resp 16 | Wt 121.0 lb

## 2018-11-30 DIAGNOSIS — C50412 Malignant neoplasm of upper-outer quadrant of left female breast: Secondary | ICD-10-CM

## 2018-11-30 DIAGNOSIS — M810 Age-related osteoporosis without current pathological fracture: Secondary | ICD-10-CM | POA: Insufficient documentation

## 2018-11-30 DIAGNOSIS — I824Z1 Acute embolism and thrombosis of unspecified deep veins of right distal lower extremity: Secondary | ICD-10-CM | POA: Diagnosis not present

## 2018-11-30 DIAGNOSIS — Z170 Estrogen receptor positive status [ER+]: Secondary | ICD-10-CM | POA: Insufficient documentation

## 2018-11-30 LAB — CBC WITH DIFFERENTIAL/PLATELET
Abs Immature Granulocytes: 0.02 10*3/uL (ref 0.00–0.07)
Basophils Absolute: 0 10*3/uL (ref 0.0–0.1)
Basophils Relative: 1 %
Eosinophils Absolute: 0.3 10*3/uL (ref 0.0–0.5)
Eosinophils Relative: 3 %
HCT: 39.6 % (ref 36.0–46.0)
Hemoglobin: 12.8 g/dL (ref 12.0–15.0)
Immature Granulocytes: 0 %
Lymphocytes Relative: 29 %
Lymphs Abs: 2.3 10*3/uL (ref 0.7–4.0)
MCH: 30.8 pg (ref 26.0–34.0)
MCHC: 32.3 g/dL (ref 30.0–36.0)
MCV: 95.2 fL (ref 80.0–100.0)
Monocytes Absolute: 0.7 10*3/uL (ref 0.1–1.0)
Monocytes Relative: 9 %
Neutro Abs: 4.5 10*3/uL (ref 1.7–7.7)
Neutrophils Relative %: 58 %
Platelets: 285 10*3/uL (ref 150–400)
RBC: 4.16 MIL/uL (ref 3.87–5.11)
RDW: 14.8 % (ref 11.5–15.5)
WBC: 7.8 10*3/uL (ref 4.0–10.5)
nRBC: 0 % (ref 0.0–0.2)

## 2018-11-30 LAB — COMPREHENSIVE METABOLIC PANEL
ALT: <5 U/L (ref 0–44)
AST: 13 U/L — ABNORMAL LOW (ref 15–41)
Albumin: 3.9 g/dL (ref 3.5–5.0)
Alkaline Phosphatase: 77 U/L (ref 38–126)
Anion gap: 11 (ref 5–15)
BUN: 24 mg/dL — ABNORMAL HIGH (ref 8–23)
CO2: 27 mmol/L (ref 22–32)
Calcium: 9.6 mg/dL (ref 8.9–10.3)
Chloride: 102 mmol/L (ref 98–111)
Creatinine, Ser: 0.82 mg/dL (ref 0.44–1.00)
GFR calc Af Amer: >60 mL/min (ref >60)
GFR calc non Af Amer: >60 mL/min (ref >60)
Glucose, Bld: 99 mg/dL (ref 70–99)
Potassium: 4.5 mmol/L (ref 3.5–5.1)
Sodium: 140 mmol/L (ref 135–145)
Total Bilirubin: 0.6 mg/dL (ref 0.3–1.2)
Total Protein: 7.4 g/dL (ref 6.5–8.1)

## 2018-11-30 MED ORDER — DENOSUMAB 60 MG/ML ~~LOC~~ SOSY
60.0000 mg | PREFILLED_SYRINGE | Freq: Once | SUBCUTANEOUS | Status: AC
  Administered 2018-11-30: 60 mg via SUBCUTANEOUS
  Filled 2018-11-30: qty 1

## 2018-11-30 NOTE — Progress Notes (Signed)
[facility] day:  12/29/2017   Chief Complaint: Laura Cisneros is a 85 y.o. female with clinical stage I left breast cancer and osteoporosis who is seen for follow-up HPI: Patient has a history of breast cancer, stage I and osteoporosis presents for follow-up. Patient previously follows up with Dr. Mike Gip, switched to establish care with me on 11/30/2018. Extensive past medical history review was performed by me. # stage I left breast cancer s/p partial mastectomy and sentinel lymph node biopsy on 12/08/2015.  Pathology revealed a 0.7 cm grade I  invasive mammary carcinoma of no special type.  There was no lymphvascular invasion.  Margins were clear.  One sentinel lymph node was negative.  Tumor was ER positive (> 90%), PR negative, and HER-2/neu 2+ (negative by FISH).  Pathologic stage was T1bN0.    Bilateral diagnostic mammogram on 11/06/2015 revealed a suspicious 5 mm mass in the upper outer left breast.  Bilateral mammogram on 11/08/2016 revealed no evidence of malignancy in either breast.  Bilateral mammogram on 11/10/2017 revealed no evidence of malignancy within either breast.  There were stable postsurgical changes within the left breast.  Radiation therapy was deferred secondary to her age, Parkinson's disease, and small well differentiated ER/PR+ tumor.  She began tamoxifen on 01/12/2016.  Tamoxifen was discontinued on 06/18/2016 after a DVT and pulmonary embolism.  She declined further endocrine therapy.  She has osteoporosis and was previously on Zometa for 5 years.  Bone density on 12/05/2015 revealed osteoporosis with a T-score of -2.9 in AP spine L1-L4 and -2.5 in the right femoral neck.  Bone density on 12/09/2017 revealed osteoporosis with a T-score of -3.0 in the AP spine L1-L4 and -2.7 in the right femur.  She began Prolia on 12/19/2015 (last injection on 07/03/2017).   She has Parkinson's disease.  Gait is unstable.  She has a history  of recurrent UTIs, vaginal atrophy, and incontinence.  She is using estrogen cream sparingly (once a week).  She has a history of iron deficiency.  She notes 2 colonoscopies in the past. She has been on B12 shots for years.   She was diagnosed with a DVT and pulmonary embolism on 06/05/2016.  Right lower extremity duplex revealed a DVT in the right lower calf veins.  CT angiogram revealed an acute embolism of the right lower lobar artery extending into the anterior, lateral and posterior basal segmental arteries within the right upper lobe anterior segmental artery.  There was no CT evidence of acute right heart strain.  She is on Xarelto.  Today patient was accompanied by his son. Per patient, she is doing okay.  Feeling well at baseline. Denies any breast concerns. Last mammogram was done October 2019. Chronic fatigue. Patient has been on Prolia every 6 months.  She is supposed to have Prolia and a follow-up appointment which were both delayed due to COVID-19.   Past Medical History:  Diagnosis Date  . A-fib ([place])    unspecified  . Anemia    unspecified  . Arthritis    osteoarthritis  . Bleeding hemorrhoid   . Breast cancer ([place])    unspecified  . Cancer ([place])    Breast  . Cataract   . Diverticulosis   . DVT (deep vein thrombosis) in pregnancy   . Hiatal hernia   . Hypertension   . Incontinence   . Osteoarthritis   . Osteoporosis    a. Intolerance to Fosamax b. Bilateral foot fractures c.  IV Boniva d. Low vitamin D e. Reclast  . Parkinson disease ([place])   . PE (pulmonary thromboembolism) ([place])   . Post-menopausal atrophic vaginitis 01/28/2017  . Scleritis    Idiopathic a. Prednisone b. s/p methotrexate  . TIA (transient ischemic attack) 1997   Possible  . Urinary urgency   . Vitamin B12 deficiency     Past Surgical History:  Procedure Laterality Date  . ABDOMINAL HYSTERECTOMY    . ABDOMINAL HYSTERECTOMY     partial  . BLADDER TACK SURGERY     Dr. Ouida Sills  .  BREAST BIOPSY Left 10/2012   benign  . BREAST BIOPSY Left 11/22/2015   invasive mammary ca   . BREAST EXCISIONAL BIOPSY Left 12/08/2015   lumpectomy  . BREAST LUMPECTOMY Left 11/2015   Invasive mammary carcinoma Grade I  . EYE SURGERY Bilateral    Catarct Extraction with IOL  . FINGER SURGERY Right   . FOOT FRACTURE SURGERY Right 06/2003   pinning, Dr. Francia Greaves, [facility]  . FOOT SURGERY Left   . INCONTINENCE SURGERY    . NEUROMA SURGERY    . PACEMAKER INSERTION Right 10/02/2016   Procedure: INSERTION PACEMAKER;  Surgeon: Isaias Cowman, MD;  Location: [facility] ORS;  Service: Cardiovascular;  Laterality: Right;  . PARTIAL MASTECTOMY WITH AXILLARY SENTINEL LYMPH NODE BIOPSY Left 12/08/2015   Procedure: PARTIAL MASTECTOMY WITH AXILLARY SENTINEL LYMPH NODE BIOPSY;  Surgeon: [name], MD;  Location: [facility] ORS;  Service: General;  Laterality: Left;    Family History  Problem Relation Age of Onset  . Esophageal cancer Sister   . Kidney disease Neg Hx   . Bladder Cancer Neg Hx   . Breast cancer Neg Hx     Social History:  reports that she has never smoked. She has never used smokeless tobacco. She reports that she does not drink alcohol or use drugs.  She denies any exposure to radiation or toxins.  She previously worked in a [employer].  She lives at the [place] at [place].  Her son's phone number is [phone].  Her grand daughter is [name].  The patient is accompanied by her son, Laura Cisneros.  Allergies: No Known Allergies  Current Medications: Current Outpatient Medications  Medication Sig Dispense Refill  . acetaminophen (TYLENOL) 325 MG tablet Take 650 mg by mouth every 6 (six) hours as needed.     . bisacodyl (DULCOLAX) 10 MG suppository Place 10 mg rectally daily as needed for moderate constipation.    . calcium carbonate (OSCAL) 1500 (600 Ca) MG TABS tablet Take 2 tablets by mouth daily.     . carbidopa-levodopa (SINEMET CR) 50-200 MG tablet Take 3 tablets by mouth 3 (three) times  daily.    Marland Kitchen conjugated estrogens (PREMARIN) vaginal cream Apply 0.5 mg ( pea-sized amount) just inside the vaginal introitus with finger-tip weekly on Monday nights    . Cyanocobalamin (VITAMIN B-12 IJ) Inject 1 mL into the muscle every 30 (thirty) days. Once a day on the 1st of the month    . diltiazem (CARDIZEM) 120 MG tablet Take 120 mg by mouth daily.    . furosemide (LASIX) 40 MG tablet Take 40 mg by mouth daily.     . Hyprom-Naphaz-Polysorb-Zn Sulf (CLEAR EYES COMPLETE) SOLN Place 2 drops into both eyes every 2 (two) hours as needed.    . latanoprost (XALATAN) 0.005 % ophthalmic solution Place 1 drop into both eyes at bedtime. Per Dr. Wallace Going     . Lidocaine HCl (ASPERCREME W/LIDOCAINE) 4 %  CREA Apply liberal amount topically  to shoulders/ neck or any other area of pain TID prn for pain/OA    . Melatonin 3 MG TABS Give 2 tablets (6 mg) by mouth at bedtime    . mirabegron ER (MYRBETRIQ) 25 MG TB24 tablet Take 25 mg by mouth daily.    . NON FORMULARY Diet Type: Regular diet. Small portions    . ondansetron (ZOFRAN) 4 MG tablet Take 4 mg by mouth every 6 (six) hours as needed for nausea or vomiting.    . potassium chloride SA (K-DUR,KLOR-CON) 20 MEQ tablet Take 20 mEq by mouth daily.     . rivaroxaban (XARELTO) 20 MG TABS tablet Take 20 mg by mouth daily with supper.     Marland Kitchen rOPINIRole (REQUIP) 0.5 MG tablet Take 1 tablet (0.5 mg total) by mouth at bedtime.     No current facility-administered medications for this visit.    Review of Systems  Constitutional: Positive for fatigue. Negative for appetite change, chills and fever.  HENT:   Negative for hearing loss and voice change.   Eyes: Negative for eye problems.  Respiratory: Negative for chest tightness and cough.   Cardiovascular: Negative for chest pain.  Gastrointestinal: Negative for abdominal distention, abdominal pain and blood in stool.  Endocrine: Negative for hot flashes.  Genitourinary: Negative for difficulty urinating and  frequency.   Musculoskeletal: Positive for arthralgias.  Skin: Negative for itching and rash.  Neurological: Negative for extremity weakness.       Unsteady gait due to Parkinson's disease  Hematological: Negative for adenopathy.  Psychiatric/Behavioral: Negative for confusion.    Performance status (ECOG): 2  Physical Exam: Blood pressure 127/72, pulse 82, temperature (!) 97.1 F (36.2 C), resp. rate 16, weight 121 lb (54.9 kg). Physical Exam  Constitutional: She is oriented to person, place, and time. No distress.  Thin build, sitting in wheelchair.  HENT:  Head: Normocephalic and atraumatic.  Mouth/Throat: No oropharyngeal exudate.  Eyes: Pupils are equal, round, and reactive to light. EOM are normal. No scleral icterus.  Neck: Normal range of motion. Neck supple.  Cardiovascular: Normal rate and regular rhythm.  No murmur heard. Pulmonary/Chest: Effort normal. No respiratory distress. She has no rales. She exhibits no tenderness.  Abdominal: Soft. She exhibits no distension.  Musculoskeletal: Normal range of motion.        General: No edema.  Neurological: She is alert and oriented to person, place, and time. She exhibits normal muscle tone.  Skin: Skin is warm and dry. She is not diaphoretic. No erythema.  Psychiatric: Affect normal.  Breast exam was performed in seated and lying down position. Patient is status post left lumpectomy with a well-healed surgical scar. No evidence of any palpable masses. No evidence of axillary adenopathy. No evidence of any palpable masses or lumps in the right breast.      Appointment on 11/30/2018  Component Date Value Ref Range Status  . Sodium 11/30/2018 140  135 - 145 mmol/L Final  . Potassium 11/30/2018 4.5  3.5 - 5.1 mmol/L Final  . Chloride 11/30/2018 102  98 - 111 mmol/L Final  . CO2 11/30/2018 27  22 - 32 mmol/L Final  . Glucose, Bld 11/30/2018 99  70 - 99 mg/dL Final  . BUN 11/30/2018 24* 8 - 23 mg/dL Final  . Creatinine,  Ser 11/30/2018 0.82  0.44 - 1.00 mg/dL Final  . Calcium 11/30/2018 9.6  8.9 - 10.3 mg/dL Final  . Total Protein 11/30/2018 7.4  6.5 -  8.1 g/dL Final  . Albumin 11/30/2018 3.9  3.5 - 5.0 g/dL Final  . AST 11/30/2018 13* 15 - 41 U/L Final  . ALT 11/30/2018 <5  0 - 44 U/L Final   RESULTS VERIFIED BY REPEAT TESTING  . Alkaline Phosphatase 11/30/2018 77  38 - 126 U/L Final  . Total Bilirubin 11/30/2018 0.6  0.3 - 1.2 mg/dL Final  . GFR calc non Af Amer 11/30/2018 >60  >60 mL/min Final  . GFR calc Af Amer 11/30/2018 >60  >60 mL/min Final  . Anion gap 11/30/2018 11  5 - 15 Final   Performed at [facility], [address] [place], [place] [address]  . WBC 11/30/2018 7.8  4.0 - 10.5 K/uL Final  . RBC 11/30/2018 4.16  3.87 - 5.11 MIL/uL Final  . Hemoglobin 11/30/2018 12.8  12.0 - 15.0 g/dL Final  . HCT 11/30/2018 39.6  36.0 - 46.0 % Final  . MCV 11/30/2018 95.2  80.0 - 100.0 fL Final  . MCH 11/30/2018 30.8  26.0 - 34.0 pg Final  . MCHC 11/30/2018 32.3  30.0 - 36.0 g/dL Final  . RDW 11/30/2018 14.8  11.5 - 15.5 % Final  . Platelets 11/30/2018 285  150 - 400 K/uL Final  . nRBC 11/30/2018 0.0  0.0 - 0.2 % Final  . Neutrophils Relative % 11/30/2018 58  % Final  . Neutro Abs 11/30/2018 4.5  1.7 - 7.7 K/uL Final  . Lymphocytes Relative 11/30/2018 29  % Final  . Lymphs Abs 11/30/2018 2.3  0.7 - 4.0 K/uL Final  . Monocytes Relative 11/30/2018 9  % Final  . Monocytes Absolute 11/30/2018 0.7  0.1 - 1.0 K/uL Final  . Eosinophils Relative 11/30/2018 3  % Final  . Eosinophils Absolute 11/30/2018 0.3  0.0 - 0.5 K/uL Final  . Basophils Relative 11/30/2018 1  % Final  . Basophils Absolute 11/30/2018 0.0  0.0 - 0.1 K/uL Final  . Immature Granulocytes 11/30/2018 0  % Final  . Abs Immature Granulocytes 11/30/2018 0.02  0.00 - 0.07 K/uL Final   Performed at [facility], [address] [place],  [address]    Assessment:  ZURISADAI HELMINIAK is a 85 y.o. female with stage I left breast cancer  s/p partial mastectomy and sentinel lymph node biopsy on 12/08/2015.  Pathology revealed a 0.7 cm grade I  invasive mammary carcinoma of no special type.  There was no lymphvascular invasion.  Margins were clear.  One sentinel lymph node was negative.  Tumor was ER positive (> 90%), PR negative, and HER-2/neu 2+ (negative by FISH).  Pathologic stage was T1bN0.   1. Malignant neoplasm of upper-outer quadrant of left breast in female, estrogen receptor positive ([place])   2. Age-related osteoporosis without current pathological fracture   3. Deep vein thrombosis (DVT) of distal vein of right lower extremity, unspecified chronicity ([facility])     1. Stage I left breast cancer: Clinically doing well. Currently not on any antiestrogen treatments due to side effects. Physical examination no clinical evidence of disease recurrence. Mammogram was done 1 year ago. I had a lengthy discussion with patient. Given her advanced age and multiple other medical problems, I ask if she would be open to additional breast surgeries/biopsies if any abnormalities are found on mammogram.  Patient expresses her wishes of not wanting to proceed with any invasive procedure at this point.  We discussed about not continuing annual diagnostic mammogram at this point. Monitor disease clinically.  If any  concerns we will send her for additional diagnostic mammograms.  Both patient and son agree with the plan. Labs are reviewed and discussed with patient. CA 27-29 is pending.  2. Osteoporosis: Proceed with Prolia 12/10/2018. Continue calcium and vitamin D supplementation. 3 history of DVT and PE, continue Xarelto. RTC in 6 months for MD (Dr Tasia Catchings)  assessment, labs (CBC with diff, CMP, CA27.29) and +/- Prolia.     Earlie Server, MD  12/29/2017, 4:35 PM

## 2018-11-30 NOTE — Progress Notes (Signed)
Patient does not offer any problems today.  

## 2018-12-01 LAB — CANCER ANTIGEN 27.29: CA 27.29: 30.6 U/mL (ref 0.0–38.6)

## 2019-05-31 ENCOUNTER — Inpatient Hospital Stay: Payer: Medicare Other

## 2019-05-31 ENCOUNTER — Encounter: Payer: Self-pay | Admitting: Oncology

## 2019-05-31 ENCOUNTER — Other Ambulatory Visit: Payer: Self-pay

## 2019-05-31 ENCOUNTER — Other Ambulatory Visit: Payer: Medicare Other

## 2019-05-31 ENCOUNTER — Ambulatory Visit: Payer: Medicare Other | Admitting: Oncology

## 2019-05-31 ENCOUNTER — Inpatient Hospital Stay: Payer: Medicare Other | Attending: Oncology

## 2019-05-31 ENCOUNTER — Inpatient Hospital Stay (HOSPITAL_BASED_OUTPATIENT_CLINIC_OR_DEPARTMENT_OTHER): Payer: Medicare Other | Admitting: Oncology

## 2019-05-31 ENCOUNTER — Ambulatory Visit: Payer: Medicare Other

## 2019-05-31 VITALS — BP 123/71 | HR 85 | Temp 95.7°F | Resp 16 | Wt 99.5 lb

## 2019-05-31 DIAGNOSIS — M810 Age-related osteoporosis without current pathological fracture: Secondary | ICD-10-CM | POA: Diagnosis not present

## 2019-05-31 DIAGNOSIS — M818 Other osteoporosis without current pathological fracture: Secondary | ICD-10-CM | POA: Diagnosis not present

## 2019-05-31 DIAGNOSIS — D539 Nutritional anemia, unspecified: Secondary | ICD-10-CM

## 2019-05-31 DIAGNOSIS — C50412 Malignant neoplasm of upper-outer quadrant of left female breast: Secondary | ICD-10-CM | POA: Insufficient documentation

## 2019-05-31 DIAGNOSIS — I824Z1 Acute embolism and thrombosis of unspecified deep veins of right distal lower extremity: Secondary | ICD-10-CM

## 2019-05-31 DIAGNOSIS — Z170 Estrogen receptor positive status [ER+]: Secondary | ICD-10-CM

## 2019-05-31 DIAGNOSIS — E538 Deficiency of other specified B group vitamins: Secondary | ICD-10-CM | POA: Insufficient documentation

## 2019-05-31 LAB — COMPREHENSIVE METABOLIC PANEL
ALT: <5 U/L (ref 0–44)
AST: 15 U/L (ref 15–41)
Albumin: 3.8 g/dL (ref 3.5–5.0)
Alkaline Phosphatase: 63 U/L (ref 38–126)
Anion gap: 12 (ref 5–15)
BUN: 24 mg/dL — ABNORMAL HIGH (ref 8–23)
CO2: 27 mmol/L (ref 22–32)
Calcium: 9.8 mg/dL (ref 8.9–10.3)
Chloride: 101 mmol/L (ref 98–111)
Creatinine, Ser: 1.03 mg/dL — ABNORMAL HIGH (ref 0.44–1.00)
GFR calc Af Amer: 55 mL/min — ABNORMAL LOW (ref >60)
GFR calc non Af Amer: 47 mL/min — ABNORMAL LOW (ref >60)
Glucose, Bld: 109 mg/dL — ABNORMAL HIGH (ref 70–99)
Potassium: 3.4 mmol/L — ABNORMAL LOW (ref 3.5–5.1)
Sodium: 140 mmol/L (ref 135–145)
Total Bilirubin: 0.9 mg/dL (ref 0.3–1.2)
Total Protein: 7.3 g/dL (ref 6.5–8.1)

## 2019-05-31 LAB — CBC WITH DIFFERENTIAL/PLATELET
Abs Immature Granulocytes: 0.03 10*3/uL (ref 0.00–0.07)
Basophils Absolute: 0.1 10*3/uL (ref 0.0–0.1)
Basophils Relative: 1 %
Eosinophils Absolute: 0.2 10*3/uL (ref 0.0–0.5)
Eosinophils Relative: 3 %
HCT: 36.2 % (ref 36.0–46.0)
Hemoglobin: 11.6 g/dL — ABNORMAL LOW (ref 12.0–15.0)
Immature Granulocytes: 0 %
Lymphocytes Relative: 25 %
Lymphs Abs: 2 10*3/uL (ref 0.7–4.0)
MCH: 32.9 pg (ref 26.0–34.0)
MCHC: 32 g/dL (ref 30.0–36.0)
MCV: 102.5 fL — ABNORMAL HIGH (ref 80.0–100.0)
Monocytes Absolute: 0.7 10*3/uL (ref 0.1–1.0)
Monocytes Relative: 9 %
Neutro Abs: 5 10*3/uL (ref 1.7–7.7)
Neutrophils Relative %: 62 %
Platelets: 408 10*3/uL — ABNORMAL HIGH (ref 150–400)
RBC: 3.53 MIL/uL — ABNORMAL LOW (ref 3.87–5.11)
RDW: 18.1 % — ABNORMAL HIGH (ref 11.5–15.5)
WBC: 8 10*3/uL (ref 4.0–10.5)
nRBC: 0 % (ref 0.0–0.2)

## 2019-05-31 LAB — FOLATE: Folate: 18.9 ng/mL (ref >5.9)

## 2019-05-31 LAB — IRON AND TIBC
Iron: 73 ug/dL (ref 28–170)
Saturation Ratios: 21 % (ref 10.4–31.8)
TIBC: 344 ug/dL (ref 250–450)
UIBC: 271 ug/dL

## 2019-05-31 LAB — VITAMIN B12: Vitamin B-12: 965 pg/mL — ABNORMAL HIGH (ref 180–914)

## 2019-05-31 MED ORDER — CYANOCOBALAMIN 1000 MCG/ML IJ SOLN
1000.0000 ug | Freq: Once | INTRAMUSCULAR | Status: AC
  Administered 2019-05-31: 15:00:00 1000 ug via INTRAMUSCULAR
  Filled 2019-05-31: qty 1

## 2019-05-31 MED ORDER — DENOSUMAB 60 MG/ML ~~LOC~~ SOSY
60.0000 mg | PREFILLED_SYRINGE | Freq: Once | SUBCUTANEOUS | Status: AC
  Administered 2019-05-31: 60 mg via SUBCUTANEOUS
  Filled 2019-05-31: qty 1

## 2019-05-31 NOTE — Progress Notes (Signed)
Laura Cisneros  Chief Complaint: Laura Cisneros is a 86 y.o. female with clinical stage I left breast cancer and osteoporosis who is seen for follow-up HPI: Patient has a history of breast cancer, stage I and osteoporosis presents for follow-up. Patient previously follows up with Dr. Mike Gip, switched to establish care with me on 11/30/2018. Extensive past medical history review was performed by me. # stage I left breast cancer s/p partial mastectomy and sentinel lymph node biopsy on 12/08/2015.  Pathology revealed a 0.7 cm grade I  invasive mammary carcinoma of no special type.  There was no lymphvascular invasion.  Margins were clear.  One sentinel lymph node was negative.  Tumor was ER positive (> 90%), PR negative, and HER-2/neu 2+ (negative by FISH).  Pathologic stage was T1bN0.    Bilateral diagnostic mammogram on 11/06/2015 revealed a suspicious 5 mm mass in the upper outer left breast.  Bilateral mammogram on 11/08/2016 revealed no evidence of malignancy in either breast.  Bilateral mammogram on 11/10/2017 revealed no evidence of malignancy within either breast.  There were stable postsurgical changes within the left breast.  Radiation therapy was deferred secondary to her age, Parkinson's disease, and small well differentiated ER/PR+ tumor.  She began tamoxifen on 01/12/2016.  Tamoxifen was discontinued on 06/18/2016 after a DVT and pulmonary embolism.  She declined further endocrine therapy.  She has osteoporosis and was previously on Zometa for 5 years.  Bone density on 12/05/2015 revealed osteoporosis with a T-score of -2.9 in AP spine L1-L4 and -2.5 in the right femoral neck.  Bone density on 12/09/2017 revealed osteoporosis with a T-score of -3.0 in the AP spine L1-L4 and -2.7 in the right femur.  She began Prolia on 12/19/2015 (last injection on 07/03/2017).   She has Parkinson's disease.  Gait is unstable.  She has a history of recurrent UTIs, vaginal  atrophy, and incontinence.  She is using estrogen cream sparingly (once a week).  She has a history of iron deficiency.  She notes 2 colonoscopies in the past. She has been on B12 shots for years.   She was diagnosed with a DVT and pulmonary embolism on 06/05/2016.  Right lower extremity duplex revealed a DVT in the right lower calf veins.  CT angiogram revealed an acute embolism of the right lower lobar artery extending into the anterior, lateral and posterior basal segmental arteries within the right upper lobe anterior segmental artery.  There was no CT evidence of acute right heart strain.  She is on Xarelto.  I discussed with the patient and her son during her visit on 11/30/2018. Given her advanced age and multiple other medical problems, I ask if she would be open to additional breast surgeries/biopsies if any abnormalities are found on mammogram.  Patient expresses her wishes of not wanting to proceed with any invasive procedure at this point.  We discussed about not continuing annual diagnostic mammogram at this point.  INTERVAL HISTORY Laura Cisneros is a 86 y.o. female who has above history reviewed by me today presents for follow up visit for B12 deficiency, breast cancer. Problems and complaints are listed below: Patient had COVID-19 pneumonia in November 2021.  Recovered.  She was living in a living facility and recently patient returned home and lives with his son. Patient has had weight loss.  Appetite has improved since patient returns home. Feels weak. Denies any breast concerns. Patient was accompanied by her son.  Per son, patient previously had vitamin  B12 injections previously which was converted to oral vitamin B12 due to pandemic.  Past Medical History:  Diagnosis Date  . A-fib ([place])    unspecified  . Anemia    unspecified  . Arthritis    osteoarthritis  . Bleeding hemorrhoid   . Breast cancer ([place])    unspecified  . Cancer ([place])    Breast  . Cataract   .  Diverticulosis   . DVT (deep vein thrombosis) in pregnancy   . Hiatal hernia   . Hypertension   . Incontinence   . Osteoarthritis   . Osteoporosis    a. Intolerance to Fosamax b. Bilateral foot fractures c. IV Boniva d. Low vitamin D e. Reclast  . Parkinson disease ([place])   . PE (pulmonary thromboembolism) ([place])   . Post-menopausal atrophic vaginitis 01/28/2017  . Scleritis    Idiopathic a. Prednisone b. s/p methotrexate  . TIA (transient ischemic attack) 1997   Possible  . Urinary urgency   . Vitamin B12 deficiency     Past Surgical History:  Procedure Laterality Date  . ABDOMINAL HYSTERECTOMY    . ABDOMINAL HYSTERECTOMY     partial  . BLADDER TACK SURGERY     Dr. Ouida Sills  . BREAST BIOPSY Left 10/2012   benign  . BREAST BIOPSY Left 11/22/2015   invasive mammary ca   . BREAST EXCISIONAL BIOPSY Left 12/08/2015   lumpectomy  . BREAST LUMPECTOMY Left 11/2015   Invasive mammary carcinoma Grade I  . EYE SURGERY Bilateral    Catarct Extraction with IOL  . FINGER SURGERY Right   . FOOT FRACTURE SURGERY Right 06/2003   pinning, Dr. Francia Greaves, [facility]  . FOOT SURGERY Left   . INCONTINENCE SURGERY    . NEUROMA SURGERY    . PACEMAKER INSERTION Right 10/02/2016   Procedure: INSERTION PACEMAKER;  Surgeon: Isaias Cowman, MD;  Location: [facility] ORS;  Service: Cardiovascular;  Laterality: Right;  . PARTIAL MASTECTOMY WITH AXILLARY SENTINEL LYMPH NODE BIOPSY Left 12/08/2015   Procedure: PARTIAL MASTECTOMY WITH AXILLARY SENTINEL LYMPH NODE BIOPSY;  Surgeon: [name], MD;  Location: [facility] ORS;  Service: General;  Laterality: Left;    Family History  Problem Relation Age of Onset  . Esophageal cancer Sister   . Kidney disease Neg Hx   . Bladder Cancer Neg Hx   . Breast cancer Neg Hx     Social History:  reports that she has never smoked. She has never used smokeless tobacco. She reports that she does not drink alcohol or use drugs.  She denies any exposure to radiation or  toxins.  She previously worked in a [employer].  She lives at the [facility] [place].  Her son's phone number is [phone].  Her grand daughter is Laura Cisneros.  The patient is accompanied by her son, Laura Cisneros.  Allergies: No Known Allergies  Current Medications: Current Outpatient Medications  Medication Sig Dispense Refill  . acetaminophen (TYLENOL) 325 MG tablet Take 650 mg by mouth every 6 (six) hours as needed.     . bisacodyl (DULCOLAX) 10 MG suppository Place 10 mg rectally daily as needed for moderate constipation.    . calcium carbonate (OSCAL) 1500 (600 Ca) MG TABS tablet Take 2 tablets by mouth daily.     . carbidopa-levodopa (SINEMET CR) 50-200 MG tablet Take 3 tablets by mouth 3 (three) times daily.    Marland Kitchen conjugated estrogens (PREMARIN) vaginal cream Apply 0.5 mg ( pea-sized amount) just inside the vaginal introitus with finger-tip weekly on Monday  nights    . Cyanocobalamin (VITAMIN B-12 IJ) Inject 1 mL into the muscle every 30 (thirty) days. Once a day on the 1st of the month    . diltiazem (CARDIZEM) 120 MG tablet Take 120 mg by mouth daily.    . furosemide (LASIX) 40 MG tablet Take 40 mg by mouth daily.     . Hyprom-Naphaz-Polysorb-Zn Sulf (CLEAR EYES COMPLETE) SOLN Place 2 drops into both eyes every 2 (two) hours as needed.    . latanoprost (XALATAN) 0.005 % ophthalmic solution Place 1 drop into both eyes at bedtime. Per Dr. Wallace Going     . Lidocaine HCl (ASPERCREME W/LIDOCAINE) 4 % CREA Apply liberal amount topically  to shoulders/ neck or any other area of pain TID prn for pain/OA    . Melatonin 3 MG TABS Give 2 tablets (6 mg) by mouth at bedtime    . mirabegron ER (MYRBETRIQ) 25 MG TB24 tablet Take 25 mg by mouth daily.    . NON FORMULARY Diet Type: Regular diet. Small portions    . ondansetron (ZOFRAN) 4 MG tablet Take 4 mg by mouth every 6 (six) hours as needed for nausea or vomiting.    . potassium chloride SA (K-DUR,KLOR-CON) 20 MEQ tablet Take 20 mEq by mouth daily.     .  rivaroxaban (XARELTO) 20 MG TABS tablet Take 20 mg by mouth daily with supper.     Marland Kitchen rOPINIRole (REQUIP) 0.5 MG tablet Take 1 tablet (0.5 mg total) by mouth at bedtime.     No current facility-administered medications for this visit.   Review of Systems  Constitutional: Positive for fatigue. Negative for appetite change, chills and fever.  HENT:   Negative for hearing loss and voice change.   Eyes: Negative for eye problems.  Respiratory: Negative for chest tightness and cough.   Cardiovascular: Negative for chest pain.  Gastrointestinal: Negative for abdominal distention, abdominal pain and blood in stool.  Endocrine: Negative for hot flashes.  Genitourinary: Negative for difficulty urinating and frequency.   Musculoskeletal: Positive for arthralgias.  Skin: Negative for itching and rash.  Neurological: Negative for extremity weakness.       Unsteady gait due to Parkinson's disease  Hematological: Negative for adenopathy.  Psychiatric/Behavioral: Negative for confusion.    Performance status (ECOG): 2  Physical Exam: Blood pressure 123/71, pulse 85, temperature (!) 95.7 F (35.4 C), resp. rate 16, weight 99 lb 8 oz (45.1 kg). Physical Exam  Constitutional: She is oriented to person, place, and time. No distress.  Thin build, sitting in wheelchair.  HENT:  Head: Normocephalic and atraumatic.  Mouth/Throat: No oropharyngeal exudate.  Eyes: Pupils are equal, round, and reactive to light. EOM are normal. No scleral icterus.  Cardiovascular: Normal rate and regular rhythm.  No murmur heard. Pulmonary/Chest: Effort normal. No respiratory distress. She has no rales. She exhibits no tenderness.  Abdominal: Soft. She exhibits no distension.  Musculoskeletal:        General: No edema. Normal range of motion.     Cervical back: Normal range of motion and neck supple.  Neurological: She is alert and oriented to person, place, and time. She exhibits normal muscle tone.  Skin: Skin is  warm and dry. She is not diaphoretic. No erythema.  Psychiatric: Affect normal.       Appointment on 05/31/2019  Component Date Value Ref Range Status  . Sodium 05/31/2019 140  135 - 145 mmol/L Final  . Potassium 05/31/2019 3.4* 3.5 - 5.1 mmol/L Final  .  Chloride 05/31/2019 101  98 - 111 mmol/L Final  . CO2 05/31/2019 27  22 - 32 mmol/L Final  . Glucose, Bld 05/31/2019 109* 70 - 99 mg/dL Final   Glucose reference range applies only to samples taken after fasting for at least 8 hours.  . BUN 05/31/2019 24* 8 - 23 mg/dL Final  . Creatinine, Ser 05/31/2019 1.03* 0.44 - 1.00 mg/dL Final  . Calcium 05/31/2019 9.8  8.9 - 10.3 mg/dL Final  . Total Protein 05/31/2019 7.3  6.5 - 8.1 g/dL Final  . Albumin 05/31/2019 3.8  3.5 - 5.0 g/dL Final  . AST 05/31/2019 15  15 - 41 U/L Final  . ALT 05/31/2019 <5  0 - 44 U/L Final  . Alkaline Phosphatase 05/31/2019 63  38 - 126 U/L Final  . Total Bilirubin 05/31/2019 0.9  0.3 - 1.2 mg/dL Final  . GFR calc non Af Amer 05/31/2019 47* >60 mL/min Final  . GFR calc Af Amer 05/31/2019 55* >60 mL/min Final  . Anion gap 05/31/2019 12  5 - 15 Final   Performed at [facility], [address], [place], [place] [address]  . WBC 05/31/2019 8.0  4.0 - 10.5 K/uL Final  . RBC 05/31/2019 3.53* 3.87 - 5.11 MIL/uL Final  . Hemoglobin 05/31/2019 11.6* 12.0 - 15.0 g/dL Final  . HCT 05/31/2019 36.2  36.0 - 46.0 % Final  . MCV 05/31/2019 102.5* 80.0 - 100.0 fL Final  . MCH 05/31/2019 32.9  26.0 - 34.0 pg Final  . MCHC 05/31/2019 32.0  30.0 - 36.0 g/dL Final  . RDW 05/31/2019 18.1* 11.5 - 15.5 % Final  . Platelets 05/31/2019 408* 150 - 400 K/uL Final  . nRBC 05/31/2019 0.0  0.0 - 0.2 % Final  . Neutrophils Relative % 05/31/2019 62  % Final  . Neutro Abs 05/31/2019 5.0  1.7 - 7.7 K/uL Final  . Lymphocytes Relative 05/31/2019 25  % Final  . Lymphs Abs 05/31/2019 2.0  0.7 - 4.0 K/uL Final  . Monocytes Relative 05/31/2019 9  % Final  . Monocytes Absolute 05/31/2019  0.7  0.1 - 1.0 K/uL Final  . Eosinophils Relative 05/31/2019 3  % Final  . Eosinophils Absolute 05/31/2019 0.2  0.0 - 0.5 K/uL Final  . Basophils Relative 05/31/2019 1  % Final  . Basophils Absolute 05/31/2019 0.1  0.0 - 0.1 K/uL Final  . Immature Granulocytes 05/31/2019 0  % Final  . Abs Immature Granulocytes 05/31/2019 0.03  0.00 - 0.07 K/uL Final   Performed at [facility], [address] [place], [place] [address]  . Iron 05/31/2019 73  28 - 170 ug/dL Final  . TIBC 05/31/2019 344  250 - 450 ug/dL Final  . Saturation Ratios 05/31/2019 21  10.4 - 31.8 % Final  . UIBC 05/31/2019 271  ug/dL Final   Performed at [facility], [address], [place], [place] [address]  . Folate 05/31/2019 18.9  >5.9 ng/mL Final   Performed at [facility], [address] [place], [place] [address]    Assessment:  SHAQUANA BUEL is a 86 y.o. female with stage I left breast cancer s/p partial mastectomy and sentinel lymph node biopsy on 12/08/2015.  Pathology revealed a 0.7 cm grade I  invasive mammary carcinoma of no special type.  There was no lymphvascular invasion.  Margins were clear.  One sentinel lymph node was negative.  Tumor was ER positive (> 90%), PR negative, and HER-2/neu 2+ (negative by  FISH).  Pathologic stage was T1bN0.   1. Malignant neoplasm of upper-outer quadrant of left breast in female, estrogen receptor positive ([place])   2. Age-related osteoporosis without current pathological fracture   3. Deep vein thrombosis (DVT) of distal vein of right lower extremity, unspecified chronicity ([place])   4. Macrocytic anemia    Labs are reviewed and discussed with patient. 1. History of stage I left breast cancer: Clinically doing well. Currently not on any antiestrogen treatments due to side effects. Not actively on surveillance mammogram due to her age, her desire not to proceed any aggressive diagnostic procedure.  2 macrocytic anemia. Patient has a longstanding history  of vitamin B12 and was previously on B12 monthly IM injections.  Currently on oral vitamin B12. I will check B12 level and folate level today. Patient feels weak and I think it is reasonable to proceed with empiric vitamin B12 injection. Depending on her vitamin B12 levels, will decide whether she will need additional vitamin B12  3 history of DVT and PE, on Xarelto, not managed by me. 4 osteoporosis, patient has been on Prolia every 6 months.  We will proceed with today's dose.  I recommend patient to continue calcium and vitamin D supplementation  RTC in 6 months for MD labs (CBC with diff, CMP, CA27.29) and +/- Prolia.     Earlie Server, MD  12/29/2017, 4:35 PM

## 2019-05-31 NOTE — Progress Notes (Signed)
Patient is now staying at her sons house and not the assisted living facility.

## 2019-06-01 ENCOUNTER — Telehealth: Payer: Self-pay

## 2019-06-01 LAB — CANCER ANTIGEN 27.29: CA 27.29: 36.7 U/mL (ref 0.0–38.6)

## 2019-06-01 LAB — FERRITIN: Ferritin: 57 ng/mL (ref 11–307)

## 2019-06-01 NOTE — Telephone Encounter (Signed)
Patient's son, [name], informed of lab results and MD recommendation.

## 2019-06-01 NOTE — Telephone Encounter (Signed)
-----   Message from Earlie Server, MD sent at 05/31/2019  9:51 PM EDT ----- Please let son/patient know that her vitamin b12 level is adequate and oral vitamin B12 seems to be effective. She does not need IM B12. Continue oral B12

## 2019-06-29 ENCOUNTER — Ambulatory Visit: Payer: Commercial Managed Care - HMO | Admitting: Urology

## 2019-07-07 ENCOUNTER — Other Ambulatory Visit: Payer: Medicare Other

## 2019-09-28 ENCOUNTER — Inpatient Hospital Stay: Attending: Oncology

## 2019-09-28 ENCOUNTER — Other Ambulatory Visit: Payer: Self-pay

## 2019-09-28 DIAGNOSIS — Z853 Personal history of malignant neoplasm of breast: Secondary | ICD-10-CM | POA: Insufficient documentation

## 2019-09-28 DIAGNOSIS — I824Z1 Acute embolism and thrombosis of unspecified deep veins of right distal lower extremity: Secondary | ICD-10-CM

## 2019-09-28 DIAGNOSIS — D539 Nutritional anemia, unspecified: Secondary | ICD-10-CM

## 2019-09-28 DIAGNOSIS — C50412 Malignant neoplasm of upper-outer quadrant of left female breast: Secondary | ICD-10-CM

## 2019-09-28 DIAGNOSIS — M810 Age-related osteoporosis without current pathological fracture: Secondary | ICD-10-CM

## 2019-09-28 LAB — CBC WITH DIFFERENTIAL/PLATELET
Abs Immature Granulocytes: 0.06 10*3/uL (ref 0.00–0.07)
Basophils Absolute: 0 10*3/uL (ref 0.0–0.1)
Basophils Relative: 0 %
Eosinophils Absolute: 0.2 10*3/uL (ref 0.0–0.5)
Eosinophils Relative: 3 %
HCT: 38.8 % (ref 36.0–46.0)
Hemoglobin: 12.7 g/dL (ref 12.0–15.0)
Immature Granulocytes: 1 %
Lymphocytes Relative: 28 %
Lymphs Abs: 1.9 10*3/uL (ref 0.7–4.0)
MCH: 31.8 pg (ref 26.0–34.0)
MCHC: 32.7 g/dL (ref 30.0–36.0)
MCV: 97.2 fL (ref 80.0–100.0)
Monocytes Absolute: 0.6 10*3/uL (ref 0.1–1.0)
Monocytes Relative: 9 %
Neutro Abs: 4.1 10*3/uL (ref 1.7–7.7)
Neutrophils Relative %: 59 %
Platelets: 274 10*3/uL (ref 150–400)
RBC: 3.99 MIL/uL (ref 3.87–5.11)
RDW: 14.2 % (ref 11.5–15.5)
WBC: 6.9 10*3/uL (ref 4.0–10.5)
nRBC: 0 % (ref 0.0–0.2)

## 2019-09-28 LAB — COMPREHENSIVE METABOLIC PANEL
ALT: <5 U/L (ref 0–44)
AST: 14 U/L — ABNORMAL LOW (ref 15–41)
Albumin: 3.8 g/dL (ref 3.5–5.0)
Alkaline Phosphatase: 53 U/L (ref 38–126)
Anion gap: 10 (ref 5–15)
BUN: 20 mg/dL (ref 8–23)
CO2: 29 mmol/L (ref 22–32)
Calcium: 9 mg/dL (ref 8.9–10.3)
Chloride: 101 mmol/L (ref 98–111)
Creatinine, Ser: 0.86 mg/dL (ref 0.44–1.00)
GFR calc Af Amer: >60 mL/min (ref >60)
GFR calc non Af Amer: 59 mL/min — ABNORMAL LOW (ref >60)
Glucose, Bld: 113 mg/dL — ABNORMAL HIGH (ref 70–99)
Potassium: 3.9 mmol/L (ref 3.5–5.1)
Sodium: 140 mmol/L (ref 135–145)
Total Bilirubin: 0.5 mg/dL (ref 0.3–1.2)
Total Protein: 7.3 g/dL (ref 6.5–8.1)

## 2019-09-28 LAB — VITAMIN B12: Vitamin B-12: 1969 pg/mL — ABNORMAL HIGH (ref 180–914)

## 2019-09-28 LAB — RETIC PANEL
Immature Retic Fract: 7.5 % (ref 2.3–15.9)
RBC.: 3.98 MIL/uL (ref 3.87–5.11)
Retic Count, Absolute: 78 10*3/uL (ref 19.0–186.0)
Retic Ct Pct: 2 % (ref 0.4–3.1)
Reticulocyte Hemoglobin: 35 pg (ref >27.9)

## 2019-09-29 ENCOUNTER — Other Ambulatory Visit: Payer: Self-pay

## 2019-09-29 ENCOUNTER — Inpatient Hospital Stay: Attending: Oncology | Admitting: Oncology

## 2019-09-29 ENCOUNTER — Encounter: Payer: Self-pay | Admitting: Oncology

## 2019-09-29 VITALS — BP 126/79 | HR 90 | Temp 97.0°F | Resp 16 | Wt 108.0 lb

## 2019-09-29 DIAGNOSIS — D539 Nutritional anemia, unspecified: Secondary | ICD-10-CM

## 2019-09-29 DIAGNOSIS — I10 Essential (primary) hypertension: Secondary | ICD-10-CM | POA: Diagnosis not present

## 2019-09-29 DIAGNOSIS — E538 Deficiency of other specified B group vitamins: Secondary | ICD-10-CM | POA: Insufficient documentation

## 2019-09-29 DIAGNOSIS — C50412 Malignant neoplasm of upper-outer quadrant of left female breast: Secondary | ICD-10-CM | POA: Insufficient documentation

## 2019-09-29 DIAGNOSIS — Z170 Estrogen receptor positive status [ER+]: Secondary | ICD-10-CM | POA: Insufficient documentation

## 2019-09-29 DIAGNOSIS — G20 Parkinson's disease: Secondary | ICD-10-CM | POA: Diagnosis not present

## 2019-09-29 DIAGNOSIS — I4891 Unspecified atrial fibrillation: Secondary | ICD-10-CM | POA: Diagnosis not present

## 2019-09-29 DIAGNOSIS — M810 Age-related osteoporosis without current pathological fracture: Secondary | ICD-10-CM | POA: Diagnosis not present

## 2019-09-29 DIAGNOSIS — Z86718 Personal history of other venous thrombosis and embolism: Secondary | ICD-10-CM | POA: Diagnosis not present

## 2019-09-29 NOTE — Progress Notes (Signed)
Patient denies new problems/concerns today.   °

## 2019-09-29 NOTE — Progress Notes (Signed)
[place]  Chief Complaint: Laura Cisneros is a 86 y.o. female with clinical stage I left breast cancer and osteoporosis who is seen for follow-up HPI: Patient has a history of breast cancer, stage I and osteoporosis presents for follow-up. Patient previously follows up with Dr. Mike Gip, switched to establish care with me on 11/30/2018. Extensive past medical history review was performed by me. # stage I left breast cancer s/p partial mastectomy and sentinel lymph node biopsy on 12/08/2015.  Pathology revealed a 0.7 cm grade I  invasive mammary carcinoma of no special type.  There was no lymphvascular invasion.  Margins were clear.  One sentinel lymph node was negative.  Tumor was ER positive (> 90%), PR negative, and HER-2/neu 2+ (negative by FISH).  Pathologic stage was T1bN0.    Bilateral diagnostic mammogram on 11/06/2015 revealed a suspicious 5 mm mass in the upper outer left breast.  Bilateral mammogram on 11/08/2016 revealed no evidence of malignancy in either breast.  Bilateral mammogram on 11/10/2017 revealed no evidence of malignancy within either breast.  There were stable postsurgical changes within the left breast.  Radiation therapy was deferred secondary to her age, Parkinson's disease, and small well differentiated ER/PR+ tumor.  She began tamoxifen on 01/12/2016.  Tamoxifen was discontinued on 06/18/2016 after a DVT and pulmonary embolism.  She declined further endocrine therapy.  She has osteoporosis and was previously on Zometa for 5 years.  Bone density on 12/05/2015 revealed osteoporosis with a T-score of -2.9 in AP spine L1-L4 and -2.5 in the right femoral neck.  Bone density on 12/09/2017 revealed osteoporosis with a T-score of -3.0 in the AP spine L1-L4 and -2.7 in the right femur.  She began Prolia on 12/19/2015  She has Parkinson's disease.  Gait is unstable.  She has a history of recurrent UTIs, vaginal atrophy, and incontinence.  She is  using estrogen cream sparingly (once a week).  She has a history of iron deficiency.  She notes 2 colonoscopies in the past. She has been on B12 shots for years and currently on oral vitamin B12 supplementation.   She was diagnosed with a DVT and pulmonary embolism on 06/05/2016.  Right lower extremity duplex revealed a DVT in the right lower calf veins.  CT angiogram revealed an acute embolism of the right lower lobar artery extending into the anterior, lateral and posterior basal segmental arteries within the right upper lobe anterior segmental artery.  There was no CT evidence of acute right heart strain.  She is on Xarelto.  I discussed with the patient and her son during her visit on 11/30/2018. Given her advanced age and multiple other medical problems, I ask if she would be open to additional breast surgeries/biopsies if any abnormalities are found on mammogram.  Patient expresses her wishes of not wanting to proceed with any invasive procedure at this point.  We discussed about not continuing annual diagnostic mammogram at this point.  # COVID-19 pneumonia in November 2021.  INTERVAL HISTORY Laura Cisneros is a 86 y.o. female who has above history reviewed by me today presents for follow up visit for B12 deficiency, breast cancer. Problems and complaints are listed below: Patient reports feeling well.  She now lives at home with her son.  She was accompanied by her son to today's visit. Appetite is fair.  Today she feels well at her baseline.  She has been weight comparing to May 2021   Past Medical History:  Diagnosis  Date  . A-fib [facility])    unspecified  . Anemia    unspecified  . Arthritis    osteoarthritis  . Bleeding hemorrhoid   . Breast cancer ([place])    unspecified  . Cancer ([place])    Breast  . Cataract   . Diverticulosis   . DVT (deep vein thrombosis) in pregnancy   . Hiatal hernia   . Hypertension   . Incontinence   . Osteoarthritis   . Osteoporosis    a. Intolerance to  Fosamax b. Bilateral foot fractures c. IV Boniva d. Low vitamin D e. Reclast  . Parkinson disease ([place])   . PE (pulmonary thromboembolism) ([place])   . Post-menopausal atrophic vaginitis 01/28/2017  . Scleritis    Idiopathic a. Prednisone b. s/p methotrexate  . TIA (transient ischemic attack) 1997   Possible  . Urinary urgency   . Vitamin B12 deficiency     Past Surgical History:  Procedure Laterality Date  . ABDOMINAL HYSTERECTOMY    . ABDOMINAL HYSTERECTOMY     partial  . BLADDER TACK SURGERY     Dr. Ouida Sills  . BREAST BIOPSY Left 10/2012   benign  . BREAST BIOPSY Left 11/22/2015   invasive mammary ca   . BREAST EXCISIONAL BIOPSY Left 12/08/2015   lumpectomy  . BREAST LUMPECTOMY Left 11/2015   Invasive mammary carcinoma Grade I  . EYE SURGERY Bilateral    Catarct Extraction with IOL  . FINGER SURGERY Right   . FOOT FRACTURE SURGERY Right 06/2003   pinning, Dr. Francia Greaves, [facility]  . FOOT SURGERY Left   . INCONTINENCE SURGERY    . NEUROMA SURGERY    . PACEMAKER INSERTION Right 10/02/2016   Procedure: INSERTION PACEMAKER;  Surgeon: Isaias Cowman, MD;  Location: [facility] ORS;  Service: Cardiovascular;  Laterality: Right;  . PARTIAL MASTECTOMY WITH AXILLARY SENTINEL LYMPH NODE BIOPSY Left 12/08/2015   Procedure: PARTIAL MASTECTOMY WITH AXILLARY SENTINEL LYMPH NODE BIOPSY;  Surgeon: [name], MD;  Location: [facility] ORS;  Service: General;  Laterality: Left;    Family History  Problem Relation Age of Onset  . Esophageal cancer Sister   . Kidney disease Neg Hx   . Bladder Cancer Neg Hx   . Breast cancer Neg Hx     Social History:  reports that she has never smoked. She has never used smokeless tobacco. She reports that she does not drink alcohol and does not use drugs.   Allergies: No Known Allergies  Current Medications: Current Outpatient Medications  Medication Sig Dispense Refill  . acetaminophen (TYLENOL) 325 MG tablet Take 650 mg by mouth every 6 (six) hours  as needed.     . bisacodyl (DULCOLAX) 10 MG suppository Place 10 mg rectally daily as needed for moderate constipation.    . calcium carbonate (OSCAL) 1500 (600 Ca) MG TABS tablet Take 2 tablets by mouth daily.     . carbidopa-levodopa (SINEMET CR) 50-200 MG tablet Take 3 tablets by mouth 3 (three) times daily.    Marland Kitchen conjugated estrogens (PREMARIN) vaginal cream Apply 0.5 mg ( pea-sized amount) just inside the vaginal introitus with finger-tip weekly on Monday nights    . Cyanocobalamin (VITAMIN B-12 IJ) Inject 1 mL into the muscle every 30 (thirty) days. Once a day on the 1st of the month    . diltiazem (CARDIZEM) 120 MG tablet Take 120 mg by mouth daily.    . furosemide (LASIX) 40 MG tablet Take 40 mg by mouth daily.     Marland Kitchen  Hyprom-Naphaz-Polysorb-Zn Sulf (CLEAR EYES COMPLETE) SOLN Place 2 drops into both eyes every 2 (two) hours as needed.    . latanoprost (XALATAN) 0.005 % ophthalmic solution Place 1 drop into both eyes at bedtime. Per Dr. Wallace Going     . Lidocaine HCl (ASPERCREME W/LIDOCAINE) 4 % CREA Apply liberal amount topically  to shoulders/ neck or any other area of pain TID prn for pain/OA    . Melatonin 3 MG TABS Give 2 tablets (6 mg) by mouth at bedtime    . NON FORMULARY Diet Type: Regular diet. Small portions    . ondansetron (ZOFRAN) 4 MG tablet Take 4 mg by mouth every 6 (six) hours as needed for nausea or vomiting.    . potassium chloride SA (K-DUR,KLOR-CON) 20 MEQ tablet Take 20 mEq by mouth daily.     . rivaroxaban (XARELTO) 20 MG TABS tablet Take 20 mg by mouth daily with supper.     Marland Kitchen rOPINIRole (REQUIP) 0.5 MG tablet Take 1 tablet (0.5 mg total) by mouth at bedtime.    . mirabegron ER (MYRBETRIQ) 25 MG TB24 tablet Take 25 mg by mouth daily. (Patient not taking: Reported on 09/29/2019)     No current facility-administered medications for this visit.   Review of Systems  Constitutional: Positive for fatigue. Negative for appetite change, chills and fever.  HENT:   Negative  for hearing loss and voice change.   Eyes: Negative for eye problems.  Respiratory: Negative for chest tightness and cough.   Cardiovascular: Negative for chest pain.  Gastrointestinal: Negative for abdominal distention, abdominal pain and blood in stool.  Endocrine: Negative for hot flashes.  Genitourinary: Negative for difficulty urinating and frequency.   Musculoskeletal: Positive for arthralgias.  Skin: Negative for itching and rash.  Neurological: Negative for extremity weakness.       Unsteady gait due to Parkinson's disease  Hematological: Negative for adenopathy.  Psychiatric/Behavioral: Negative for confusion.     Performance status (ECOG): 2  Physical Exam: Blood pressure 126/79, pulse 90, temperature (!) 97 F (36.1 C), resp. rate 16, weight 108 lb (49 kg). Physical Exam Constitutional:      General: She is not in acute distress.    Appearance: She is not diaphoretic.     Comments: Thin build, sitting in wheelchair.  HENT:     Head: Normocephalic and atraumatic.     Mouth/Throat:     Pharynx: No oropharyngeal exudate.  Eyes:     General: No scleral icterus.    Pupils: Pupils are equal, round, and reactive to light.  Cardiovascular:     Rate and Rhythm: Normal rate and regular rhythm.     Heart sounds: No murmur heard.   Pulmonary:     Effort: Pulmonary effort is normal. No respiratory distress.     Breath sounds: No rales.  Chest:     Chest wall: No tenderness.  Abdominal:     General: There is no distension.     Palpations: Abdomen is soft.  Musculoskeletal:        General: Normal range of motion.     Cervical back: Normal range of motion and neck supple.  Skin:    General: Skin is warm and dry.     Findings: No erythema.  Neurological:     Mental Status: She is alert and oriented to person, place, and time.     Motor: No abnormal muscle tone.  Psychiatric:        Mood and Affect: Affect normal.  Breast exam was performed in seated and lying down  position. Patient is status post left lumpectomy with a well-healed surgical scar. No evidence of any palpable masses. No evidence of any palpable masses or lumps in the right breast.  Patient has a pea size mobile right axillary lymphadenopathy which present at the last physical examination.  No palpable lymphadenopathy on the left side.    Appointment on 09/28/2019  Component Date Value Ref Range Status  . Retic Ct Pct 09/28/2019 2.0  0.4 - 3.1 % Final  . RBC. 09/28/2019 3.98  3.87 - 5.11 MIL/uL Final  . Retic Count, Absolute 09/28/2019 78.0  19.0 - 186.0 K/uL Final  . Immature Retic Fract 09/28/2019 7.5  2.3 - 15.9 % Final  . Reticulocyte Hemoglobin 09/28/2019 35.0  >27.9 pg Final   Comment:        Given the high negative predictive value of a RET-He result > 32 pg iron deficiency is essentially excluded. If this patient is anemic other etiologies should be considered. Performed at [facility], [address], [place], [place] [address]   . Vitamin B-12 09/28/2019 1,969* 180 - 914 pg/mL Final   Comment: (NOTE) This assay is not validated for testing neonatal or myeloproliferative syndrome specimens for Vitamin B12 levels. Performed at [facility], [place] [address], [place], [place] [address]   . Sodium 09/28/2019 140  135 - 145 mmol/L Final  . Potassium 09/28/2019 3.9  3.5 - 5.1 mmol/L Final  . Chloride 09/28/2019 101  98 - 111 mmol/L Final  . CO2 09/28/2019 29  22 - 32 mmol/L Final  . Glucose, Bld 09/28/2019 113* 70 - 99 mg/dL Final   Glucose reference range applies only to samples taken after fasting for at least 8 hours.  . BUN 09/28/2019 20  8 - 23 mg/dL Final  . Creatinine, Ser 09/28/2019 0.86  0.44 - 1.00 mg/dL Final  . Calcium 09/28/2019 9.0  8.9 - 10.3 mg/dL Final  . Total Protein 09/28/2019 7.3  6.5 - 8.1 g/dL Final  . Albumin 09/28/2019 3.8  3.5 - 5.0 g/dL Final  . AST 09/28/2019 14* 15 - 41 U/L Final  . ALT 09/28/2019 <5  0 - 44 U/L Final  .  Alkaline Phosphatase 09/28/2019 53  38 - 126 U/L Final  . Total Bilirubin 09/28/2019 0.5  0.3 - 1.2 mg/dL Final  . GFR calc non Af Amer 09/28/2019 59* >60 mL/min Final  . GFR calc Af Amer 09/28/2019 >60  >60 mL/min Final  . Anion gap 09/28/2019 10  5 - 15 Final   Performed at [facility], [address] [place],  [address]  . WBC 09/28/2019 6.9  4.0 - 10.5 K/uL Final  . RBC 09/28/2019 3.99  3.87 - 5.11 MIL/uL Final  . Hemoglobin 09/28/2019 12.7  12.0 - 15.0 g/dL Final  . HCT 09/28/2019 38.8  36 - 46 % Final  . MCV 09/28/2019 97.2  80.0 - 100.0 fL Final  . MCH 09/28/2019 31.8  26.0 - 34.0 pg Final  . MCHC 09/28/2019 32.7  30.0 - 36.0 g/dL Final  . RDW 09/28/2019 14.2  11.5 - 15.5 % Final  . Platelets 09/28/2019 274  150 - 400 K/uL Final  . nRBC 09/28/2019 0.0  0.0 - 0.2 % Final  . Neutrophils Relative % 09/28/2019 59  % Final  . Neutro Abs 09/28/2019 4.1  1.7 - 7.7 K/uL Final  . Lymphocytes Relative 09/28/2019 28  % Final  .  Lymphs Abs 09/28/2019 1.9  0.7 - 4.0 K/uL Final  . Monocytes Relative 09/28/2019 9  % Final  . Monocytes Absolute 09/28/2019 0.6  0 - 1 K/uL Final  . Eosinophils Relative 09/28/2019 3  % Final  . Eosinophils Absolute 09/28/2019 0.2  0 - 0 K/uL Final  . Basophils Relative 09/28/2019 0  % Final  . Basophils Absolute 09/28/2019 0.0  0 - 0 K/uL Final  . Immature Granulocytes 09/28/2019 1  % Final  . Abs Immature Granulocytes 09/28/2019 0.06  0.00 - 0.07 K/uL Final   Performed at [facility], [address] [place], [place] [address]    Assessment:  Laura Cisneros is a 86 y.o. female with stage I left breast cancer s/p partial mastectomy and sentinel lymph node biopsy on 12/08/2015.  Pathology revealed a 0.7 cm grade I  invasive mammary carcinoma of no special type.  There was no lymphvascular invasion.  Margins were clear.  One sentinel lymph node was negative.  Tumor was ER positive (> 90%), PR negative, and HER-2/neu 2+ (negative by FISH).   Pathologic stage was T1bN0.   1. Malignant neoplasm of upper-outer quadrant of left breast in female, estrogen receptor positive ([place])   2. Macrocytic anemia   3. Age-related osteoporosis without current pathological fracture   4. History of DVT (deep vein thrombosis)    Labs are reviewed and discussed with patient. 1. History of stage I left breast cancer: Clinically doing well. Currently not on any antiestrogen treatments due to side effects. Patient prefers to stop surveillance mammogram due to her age.  She desires not to proceed with any aggressive diagnostic procedures at this point.  Monitor clinically. Currently not on any antiestrogen treatments due to side effects. Not actively on surveillance mammogram due to her age, her desire not to proceed any aggressive diagnostic procedure.  2 macrocytic anemia, today's labs showed complete resolution of anemia.  Hemoglobin is normal at 12.7.  MCV also normalized.  3 longstanding history of vitamin B12 deficiency and previously on B12 monthly injections.  Currently on oral vitamin B12 supplementation.  B12 level is adequate and actually is high.  Patient son cannot recall the exact dose of vitamin B12 patient is currently on.  I recommend patient to cut vitamin B12 supplementation to half dose No need for vitamin B12 injections.  4 history of DVT and PE, on Xarelto, not managed by me. 5 osteoporosis, patient has been on Prolia every 6 months.  Patient will proceed Prolia in 2 months. Recommend her to repeat bone density in November 2021 Recommend patient to continue calcium and vitamin D supplementation.  RTC in 7 months for MD labs (CBC with diff, CMP, CA27.29) and +/- Prolia in 8 months. Earlie Server, MD  12/29/2017, 4:35 PM

## 2019-09-30 ENCOUNTER — Telehealth: Payer: Self-pay

## 2019-09-30 NOTE — Telephone Encounter (Signed)
-----   Message from Earlie Server, MD sent at 09/29/2019  6:52 PM EDT ----- I mentioned about doing bone density testing in November along with other discussion. I don't think son/patient gave me a yes or no for that bone density. Please ask her son if pt would like to repeat bone density, if yes, please arrange her to do bone density in Nov 2021. Thanks.

## 2019-09-30 NOTE — Telephone Encounter (Signed)
They would rather not get the bone densisty test unless it is a requirement for her to continue the Prolia injections.

## 2019-09-30 NOTE — Telephone Encounter (Signed)
Thank you :)

## 2019-12-03 ENCOUNTER — Inpatient Hospital Stay: Payer: Medicare Other

## 2019-12-03 ENCOUNTER — Other Ambulatory Visit: Payer: Self-pay

## 2019-12-03 ENCOUNTER — Inpatient Hospital Stay: Payer: Medicare Other | Attending: Oncology

## 2019-12-03 DIAGNOSIS — M818 Other osteoporosis without current pathological fracture: Secondary | ICD-10-CM | POA: Insufficient documentation

## 2019-12-03 DIAGNOSIS — M810 Age-related osteoporosis without current pathological fracture: Secondary | ICD-10-CM

## 2019-12-03 DIAGNOSIS — D539 Nutritional anemia, unspecified: Secondary | ICD-10-CM

## 2019-12-03 LAB — BASIC METABOLIC PANEL
Anion gap: 9 (ref 5–15)
BUN: 18 mg/dL (ref 8–23)
CO2: 30 mmol/L (ref 22–32)
Calcium: 9.3 mg/dL (ref 8.9–10.3)
Chloride: 100 mmol/L (ref 98–111)
Creatinine, Ser: 0.78 mg/dL (ref 0.44–1.00)
GFR, Estimated: >60 mL/min (ref >60)
Glucose, Bld: 88 mg/dL (ref 70–99)
Potassium: 4.2 mmol/L (ref 3.5–5.1)
Sodium: 139 mmol/L (ref 135–145)

## 2019-12-03 MED ORDER — DENOSUMAB 60 MG/ML ~~LOC~~ SOSY
60.0000 mg | PREFILLED_SYRINGE | Freq: Once | SUBCUTANEOUS | Status: AC
  Administered 2019-12-03: 60 mg via SUBCUTANEOUS
  Filled 2019-12-03: qty 1

## 2020-01-17 ENCOUNTER — Telehealth: Payer: Self-pay | Admitting: *Deleted

## 2020-01-17 NOTE — Telephone Encounter (Signed)
FYI...  Pts son Laura Cisneros called asking if  his mother's 05/01/20 lab/MD appt be moved out 1 month. Pt was sched per last 09/2019 los to RTC for labs 1-2 day prior  to seeing MD in 7 months. He also requested to have Benjamine Mola to give him a call back  He has some questions in reference to pts Prolia Inj.

## 2020-01-17 NOTE — Telephone Encounter (Signed)
Spoke to [employer] and he states that patient is due for Prolia in May and he is wanting to know if the lab (05/01/20) and  MD (05/03/20) appts can be done the same day as the prolia or if not, at least in the same month "so insurance will cover the prolia."  Pt currenlty only scheduled for lab/MD, no prolia. Please advise on rescheduling thest appts.   [name] notiied that MD is currently out of office and we will contact him back once she returns. He voiced understanding.

## 2020-01-20 NOTE — Telephone Encounter (Signed)
Please schedule move lab/MD from April to May and add prolia. Please notify [name] of new appt details and tell him everything was able to be moved to the same day. Thanks.

## 2020-01-20 NOTE — Telephone Encounter (Signed)
Ok to postpone her lab md to May 2022, lab md prolia thanks.

## 2020-05-01 ENCOUNTER — Other Ambulatory Visit: Payer: Medicare Other

## 2020-05-03 ENCOUNTER — Ambulatory Visit: Payer: Medicare Other | Admitting: Oncology

## 2020-05-29 ENCOUNTER — Inpatient Hospital Stay (HOSPITAL_BASED_OUTPATIENT_CLINIC_OR_DEPARTMENT_OTHER): Payer: Medicare Other | Admitting: Oncology

## 2020-05-29 ENCOUNTER — Other Ambulatory Visit: Payer: Self-pay

## 2020-05-29 ENCOUNTER — Inpatient Hospital Stay: Payer: Medicare Other | Attending: Oncology

## 2020-05-29 ENCOUNTER — Encounter: Payer: Self-pay | Admitting: Oncology

## 2020-05-29 ENCOUNTER — Inpatient Hospital Stay: Payer: Medicare Other

## 2020-05-29 VITALS — BP 122/80 | HR 69 | Temp 97.3°F | Resp 18 | Wt 112.1 lb

## 2020-05-29 DIAGNOSIS — Z853 Personal history of malignant neoplasm of breast: Secondary | ICD-10-CM | POA: Diagnosis not present

## 2020-05-29 DIAGNOSIS — M810 Age-related osteoporosis without current pathological fracture: Secondary | ICD-10-CM

## 2020-05-29 DIAGNOSIS — C50412 Malignant neoplasm of upper-outer quadrant of left female breast: Secondary | ICD-10-CM | POA: Diagnosis not present

## 2020-05-29 DIAGNOSIS — Z170 Estrogen receptor positive status [ER+]: Secondary | ICD-10-CM

## 2020-05-29 DIAGNOSIS — E538 Deficiency of other specified B group vitamins: Secondary | ICD-10-CM | POA: Insufficient documentation

## 2020-05-29 DIAGNOSIS — D539 Nutritional anemia, unspecified: Secondary | ICD-10-CM

## 2020-05-29 LAB — COMPREHENSIVE METABOLIC PANEL
ALT: <5 U/L (ref 0–44)
AST: 14 U/L — ABNORMAL LOW (ref 15–41)
Albumin: 3.8 g/dL (ref 3.5–5.0)
Alkaline Phosphatase: 51 U/L (ref 38–126)
Anion gap: 11 (ref 5–15)
BUN: 19 mg/dL (ref 8–23)
CO2: 28 mmol/L (ref 22–32)
Calcium: 9.3 mg/dL (ref 8.9–10.3)
Chloride: 101 mmol/L (ref 98–111)
Creatinine, Ser: 0.7 mg/dL (ref 0.44–1.00)
GFR, Estimated: >60 mL/min (ref >60)
Glucose, Bld: 114 mg/dL — ABNORMAL HIGH (ref 70–99)
Potassium: 4.2 mmol/L (ref 3.5–5.1)
Sodium: 140 mmol/L (ref 135–145)
Total Bilirubin: 0.6 mg/dL (ref 0.3–1.2)
Total Protein: 7.5 g/dL (ref 6.5–8.1)

## 2020-05-29 LAB — CBC WITH DIFFERENTIAL/PLATELET
Abs Immature Granulocytes: 0.02 10*3/uL (ref 0.00–0.07)
Basophils Absolute: 0 10*3/uL (ref 0.0–0.1)
Basophils Relative: 0 %
Eosinophils Absolute: 0.2 10*3/uL (ref 0.0–0.5)
Eosinophils Relative: 3 %
HCT: 41.2 % (ref 36.0–46.0)
Hemoglobin: 13.5 g/dL (ref 12.0–15.0)
Immature Granulocytes: 0 %
Lymphocytes Relative: 31 %
Lymphs Abs: 2.1 10*3/uL (ref 0.7–4.0)
MCH: 32.4 pg (ref 26.0–34.0)
MCHC: 32.8 g/dL (ref 30.0–36.0)
MCV: 98.8 fL (ref 80.0–100.0)
Monocytes Absolute: 0.6 10*3/uL (ref 0.1–1.0)
Monocytes Relative: 9 %
Neutro Abs: 3.8 10*3/uL (ref 1.7–7.7)
Neutrophils Relative %: 57 %
Platelets: 248 10*3/uL (ref 150–400)
RBC: 4.17 MIL/uL (ref 3.87–5.11)
RDW: 13.9 % (ref 11.5–15.5)
WBC: 6.7 10*3/uL (ref 4.0–10.5)
nRBC: 0 % (ref 0.0–0.2)

## 2020-05-29 LAB — RETIC PANEL
Immature Retic Fract: 10.2 % (ref 2.3–15.9)
RBC.: 4.16 MIL/uL (ref 3.87–5.11)
Retic Count, Absolute: 67.8 10*3/uL (ref 19.0–186.0)
Retic Ct Pct: 1.6 % (ref 0.4–3.1)
Reticulocyte Hemoglobin: 36 pg (ref >27.9)

## 2020-05-29 LAB — VITAMIN B12: Vitamin B-12: 825 pg/mL (ref 180–914)

## 2020-05-29 MED ORDER — DENOSUMAB 60 MG/ML ~~LOC~~ SOSY
60.0000 mg | PREFILLED_SYRINGE | Freq: Once | SUBCUTANEOUS | Status: AC
  Administered 2020-05-29: 60 mg via SUBCUTANEOUS
  Filled 2020-05-29: qty 1

## 2020-05-29 NOTE — Progress Notes (Signed)
Patient here for follow up. No new concerns voiced.  °

## 2020-05-29 NOTE — Progress Notes (Signed)
[place]  Chief Complaint: Laura Cisneros is a 87 y.o. female with clinical stage I left breast cancer and osteoporosis who is seen for follow-up HPI: Patient has a history of breast cancer, stage I and osteoporosis presents for follow-up. Patient previously follows up with Dr. Mike Gip, switched to establish care with me on 11/30/2018. Extensive past medical history review was performed by me. # stage I left breast cancer s/p partial mastectomy and sentinel lymph node biopsy on 12/08/2015.  Pathology revealed a 0.7 cm grade I  invasive mammary carcinoma of no special type.  There was no lymphvascular invasion.  Margins were clear.  One sentinel lymph node was negative.  Tumor was ER positive (> 90%), PR negative, and HER-2/neu 2+ (negative by FISH).  Pathologic stage was T1bN0.    Bilateral diagnostic mammogram on 11/06/2015 revealed a suspicious 5 mm mass in the upper outer left breast.  Bilateral mammogram on 11/08/2016 revealed no evidence of malignancy in either breast.  Bilateral mammogram on 11/10/2017 revealed no evidence of malignancy within either breast.  There were stable postsurgical changes within the left breast.  Radiation therapy was deferred secondary to her age, Parkinson's disease, and small well differentiated ER/PR+ tumor.  She began tamoxifen on 01/12/2016.  Tamoxifen was discontinued on 06/18/2016 after a DVT and pulmonary embolism.  She declined further endocrine therapy.  She has osteoporosis and was previously on Zometa for 5 years.  Bone density on 12/05/2015 revealed osteoporosis with a T-score of -2.9 in AP spine L1-L4 and -2.5 in the right femoral neck.  Bone density on 12/09/2017 revealed osteoporosis with a T-score of -3.0 in the AP spine L1-L4 and -2.7 in the right femur.  She began Prolia on 12/19/2015  She has Parkinson's disease.  Gait is unstable.  She has a history of recurrent UTIs, vaginal atrophy, and incontinence.  She is  using estrogen cream sparingly (once a week).  She has a history of iron deficiency.  She notes 2 colonoscopies in the past. She has been on B12 shots for years and currently on oral vitamin B12 supplementation.   She was diagnosed with a DVT and pulmonary embolism on 06/05/2016.  Right lower extremity duplex revealed a DVT in the right lower calf veins.  CT angiogram revealed an acute embolism of the right lower lobar artery extending into the anterior, lateral and posterior basal segmental arteries within the right upper lobe anterior segmental artery.  There was no CT evidence of acute right heart strain.  She is on Xarelto.  I discussed with the patient and her son during her visit on 11/30/2018. Given her advanced age and multiple other medical problems, I ask if she would be open to additional breast surgeries/biopsies if any abnormalities are found on mammogram.  Patient expresses her wishes of not wanting to proceed with any invasive procedure at this point.  We discussed about not continuing annual diagnostic mammogram at this point.  # COVID-19 pneumonia in November 2021.  INTERVAL HISTORY Laura Cisneros is a 87 y.o. female who has above history reviewed by me today presents for follow up visit for B12 deficiency, breast cancer. Problems and complaints are listed below: Patient was accompanied by one of her sons Jeneen Rinks. She reports feeling well.  Appetite is fair.  She has gained 4 pounds weight since last visit.   Past Medical History:  Diagnosis Date  . A-fib ([place])    unspecified  . Anemia    unspecified  .  Arthritis    osteoarthritis  . Bleeding hemorrhoid   . Breast cancer ([place])    unspecified  . Cancer ([place])    Breast  . Cataract   . Diverticulosis   . DVT (deep vein thrombosis) in pregnancy   . Hiatal hernia   . Hypertension   . Incontinence   . Osteoarthritis   . Osteoporosis    a. Intolerance to Fosamax b. Bilateral foot fractures c. IV Boniva d. Low vitamin D e.  Reclast  . Parkinson disease ([place])   . PE (pulmonary thromboembolism) ([place])   . Post-menopausal atrophic vaginitis 01/28/2017  . Scleritis    Idiopathic a. Prednisone b. s/p methotrexate  . TIA (transient ischemic attack) 1997   Possible  . Urinary urgency   . Vitamin B12 deficiency     Past Surgical History:  Procedure Laterality Date  . ABDOMINAL HYSTERECTOMY    . ABDOMINAL HYSTERECTOMY     partial  . BLADDER TACK SURGERY     Dr. Ouida Sills  . BREAST BIOPSY Left 10/2012   benign  . BREAST BIOPSY Left 11/22/2015   invasive mammary ca   . BREAST EXCISIONAL BIOPSY Left 12/08/2015   lumpectomy  . BREAST LUMPECTOMY Left 11/2015   Invasive mammary carcinoma Grade I  . EYE SURGERY Bilateral    Catarct Extraction with IOL  . FINGER SURGERY Right   . FOOT FRACTURE SURGERY Right 06/2003   pinning, Dr. Francia Greaves, [facility]  . FOOT SURGERY Left   . INCONTINENCE SURGERY    . NEUROMA SURGERY    . PACEMAKER INSERTION Right 10/02/2016   Procedure: INSERTION PACEMAKER;  Surgeon: Isaias Cowman, MD;  Location: [facility] ORS;  Service: Cardiovascular;  Laterality: Right;  . PARTIAL MASTECTOMY WITH AXILLARY SENTINEL LYMPH NODE BIOPSY Left 12/08/2015   Procedure: PARTIAL MASTECTOMY WITH AXILLARY SENTINEL LYMPH NODE BIOPSY;  Surgeon: [name], MD;  Location: [facility] ORS;  Service: General;  Laterality: Left;    Family History  Problem Relation Age of Onset  . Esophageal cancer Sister   . Kidney disease Neg Hx   . Bladder Cancer Neg Hx   . Breast cancer Neg Hx     Social History:  reports that she has never smoked. She has never used smokeless tobacco. She reports that she does not drink alcohol and does not use drugs.   Allergies: No Known Allergies  Current Medications: Current Outpatient Medications  Medication Sig Dispense Refill  . acetaminophen (TYLENOL) 325 MG tablet Take 650 mg by mouth every 6 (six) hours as needed.     . bisacodyl (DULCOLAX) 10 MG suppository Place 10 mg  rectally daily as needed for moderate constipation.    . calcium carbonate (OSCAL) 1500 (600 Ca) MG TABS tablet Take 2 tablets by mouth daily.     . carbidopa-levodopa (SINEMET CR) 50-200 MG tablet Take 3 tablets by mouth 3 (three) times daily.    Marland Kitchen conjugated estrogens (PREMARIN) vaginal cream Apply 0.5 mg ( pea-sized amount) just inside the vaginal introitus with finger-tip weekly on Monday nights    . diltiazem (CARDIZEM) 120 MG tablet Take 120 mg by mouth daily.    . furosemide (LASIX) 40 MG tablet Take 40 mg by mouth daily.     . Hyprom-Naphaz-Polysorb-Zn Sulf (CLEAR EYES COMPLETE) SOLN Place 2 drops into both eyes every 2 (two) hours as needed.    . latanoprost (XALATAN) 0.005 % ophthalmic solution Place 1 drop into both eyes at bedtime. Per Dr. Wallace Going    . Lidocaine  HCl 4 % CREA Apply liberal amount topically  to shoulders/ neck or any other area of pain TID prn for pain/OA    . Melatonin 3 MG TABS Give 2 tablets (6 mg) by mouth at bedtime    . mirabegron ER (MYRBETRIQ) 25 MG TB24 tablet Take 25 mg by mouth daily.    . NON FORMULARY Diet Type: Regular diet. Small portions    . ondansetron (ZOFRAN) 4 MG tablet Take 4 mg by mouth every 6 (six) hours as needed for nausea or vomiting.    . potassium chloride SA (K-DUR,KLOR-CON) 20 MEQ tablet Take 20 mEq by mouth daily.     . rivaroxaban (XARELTO) 20 MG TABS tablet Take 20 mg by mouth daily with supper.     Marland Kitchen rOPINIRole (REQUIP) 0.5 MG tablet Take 1 tablet (0.5 mg total) by mouth at bedtime.    . Cyanocobalamin (VITAMIN B-12 IJ) Inject 1 mL into the muscle every 30 (thirty) days. Once a day on the 1st of the month (Patient not taking: Reported on 05/29/2020)     No current facility-administered medications for this visit.   Review of Systems  Constitutional: Positive for fatigue. Negative for appetite change, chills and fever.  HENT:   Negative for hearing loss and voice change.   Eyes: Negative for eye problems.  Respiratory: Negative  for chest tightness and cough.   Cardiovascular: Negative for chest pain.  Gastrointestinal: Negative for abdominal distention, abdominal pain and blood in stool.  Endocrine: Negative for hot flashes.  Genitourinary: Negative for difficulty urinating and frequency.   Musculoskeletal: Positive for arthralgias.  Skin: Negative for itching and rash.  Neurological: Negative for extremity weakness.       Unsteady gait due to Parkinson's disease  Hematological: Negative for adenopathy.  Psychiatric/Behavioral: Negative for confusion.     Performance status (ECOG): 2  Physical Exam: Blood pressure 122/80, pulse 69, temperature (!) 97.3 F (36.3 C), resp. rate 18, weight 112 lb 1.6 oz (50.8 kg). Physical Exam Constitutional:      General: She is not in acute distress.    Appearance: She is not diaphoretic.     Comments: Thin build, sitting in wheelchair.  HENT:     Head: Normocephalic and atraumatic.     Mouth/Throat:     Pharynx: No oropharyngeal exudate.  Eyes:     General: No scleral icterus.    Pupils: Pupils are equal, round, and reactive to light.  Cardiovascular:     Rate and Rhythm: Normal rate and regular rhythm.     Heart sounds: No murmur heard.   Pulmonary:     Effort: Pulmonary effort is normal. No respiratory distress.     Breath sounds: No rales.  Chest:     Chest wall: No tenderness.  Abdominal:     General: There is no distension.     Palpations: Abdomen is soft.  Musculoskeletal:        General: Normal range of motion.     Cervical back: Normal range of motion and neck supple.  Skin:    General: Skin is warm and dry.     Findings: No erythema.  Neurological:     Mental Status: She is alert and oriented to person, place, and time.     Motor: No abnormal muscle tone.  Psychiatric:        Mood and Affect: Affect normal.    Breast exam was performed in seated and lying down position. Patient is status post left lumpectomy  with a well-healed surgical  scar. No evidence of any palpable masses. No evidence of any palpable masses or lumps in the right breast.  Patient has a pea size mobile right axillary lymphadenopathy which is chronic was presented at the last physical examination.  No palpable lymphadenopathy on the left side.    Appointment on 05/29/2020  Component Date Value Ref Range Status  . Retic Ct Pct 05/29/2020 1.6  0.4 - 3.1 % Final  . RBC. 05/29/2020 4.16  3.87 - 5.11 MIL/uL Final  . Retic Count, Absolute 05/29/2020 67.8  19.0 - 186.0 K/uL Final  . Immature Retic Fract 05/29/2020 10.2  2.3 - 15.9 % Final  . Reticulocyte Hemoglobin 05/29/2020 36.0  >27.9 pg Final   Comment:        Given the high negative predictive value of a RET-He result > 32 pg iron deficiency is essentially excluded. If this patient is anemic other etiologies should be considered. Performed at [facility], [address] [place], [place] [address]   . Sodium 05/29/2020 140  135 - 145 mmol/L Final  . Potassium 05/29/2020 4.2  3.5 - 5.1 mmol/L Final  . Chloride 05/29/2020 101  98 - 111 mmol/L Final  . CO2 05/29/2020 28  22 - 32 mmol/L Final  . Glucose, Bld 05/29/2020 114* 70 - 99 mg/dL Final   Glucose reference range applies only to samples taken after fasting for at least 8 hours.  . BUN 05/29/2020 19  8 - 23 mg/dL Final  . Creatinine, Ser 05/29/2020 0.70  0.44 - 1.00 mg/dL Final  . Calcium 05/29/2020 9.3  8.9 - 10.3 mg/dL Final  . Total Protein 05/29/2020 7.5  6.5 - 8.1 g/dL Final  . Albumin 05/29/2020 3.8  3.5 - 5.0 g/dL Final  . AST 05/29/2020 14* 15 - 41 U/L Final  . ALT 05/29/2020 <5  0 - 44 U/L Final  . Alkaline Phosphatase 05/29/2020 51  38 - 126 U/L Final  . Total Bilirubin 05/29/2020 0.6  0.3 - 1.2 mg/dL Final  . GFR, Estimated 05/29/2020 >60  >60 mL/min Final   Comment: (NOTE) Calculated using the CKD-EPI Creatinine Equation (2021)   . Anion gap 05/29/2020 11  5 - 15 Final   Performed at [facility], [address] [place],  [address]  . WBC 05/29/2020 6.7  4.0 - 10.5 K/uL Final  . RBC 05/29/2020 4.17  3.87 - 5.11 MIL/uL Final  . Hemoglobin 05/29/2020 13.5  12.0 - 15.0 g/dL Final  . HCT 05/29/2020 41.2  36.0 - 46.0 % Final  . MCV 05/29/2020 98.8  80.0 - 100.0 fL Final  . MCH 05/29/2020 32.4  26.0 - 34.0 pg Final  . MCHC 05/29/2020 32.8  30.0 - 36.0 g/dL Final  . RDW 05/29/2020 13.9  11.5 - 15.5 % Final  . Platelets 05/29/2020 248  150 - 400 K/uL Final  . nRBC 05/29/2020 0.0  0.0 - 0.2 % Final  . Neutrophils Relative % 05/29/2020 57  % Final  . Neutro Abs 05/29/2020 3.8  1.7 - 7.7 K/uL Final  . Lymphocytes Relative 05/29/2020 31  % Final  . Lymphs Abs 05/29/2020 2.1  0.7 - 4.0 K/uL Final  . Monocytes Relative 05/29/2020 9  % Final  . Monocytes Absolute 05/29/2020 0.6  0.1 - 1.0 K/uL Final  . Eosinophils Relative 05/29/2020 3  % Final  . Eosinophils Absolute 05/29/2020 0.2  0.0 - 0.5 K/uL Final  . Basophils Relative 05/29/2020 0  %  Final  . Basophils Absolute 05/29/2020 0.0  0.0 - 0.1 K/uL Final  . Immature Granulocytes 05/29/2020 0  % Final  . Abs Immature Granulocytes 05/29/2020 0.02  0.00 - 0.07 K/uL Final   Performed at [facility], [address] [place], [place] [address]    Assessment:  Laura Cisneros is a 87 y.o. female with stage I left breast cancer s/p partial mastectomy and sentinel lymph node biopsy on 12/08/2015.  Pathology revealed a 0.7 cm grade I  invasive mammary carcinoma of no special type.  There was no lymphvascular invasion.  Margins were clear.  One sentinel lymph node was negative.  Tumor was ER positive (> 90%), PR negative, and HER-2/neu 2+ (negative by FISH).  Pathologic stage was T1bN0.   1. Malignant neoplasm of upper-outer quadrant of left breast in female, estrogen receptor positive ([place])   2. Age-related osteoporosis without current pathological fracture   3. B12 deficiency    # History of stage I left breast cancer: Clinically doing well. Currently  not on any antiestrogen treatments due to side effects, and not on surveillance mammogram due to age and patient's preference.  Patient desires not to proceed with any aggressive diagnostic procedure at this point.  Monitor clinically.Labs are reviewed and discussed with patient.   #History of vitamin B12 deficiency and previously on B12 monthly injections.  Currently on oral vitamin B12 supplementation.  Vitamin B12 level is pending.  #history of DVT and PE, on Xarelto, not managed by me. # osteoporosis, patient has been on Prolia every 6 months.  Proceed with Prolia  today.   recommend patient to continue calcium and vitamin D supplementation   RTC in 6 months for MD labs (CBC with diff, CMP, CA27.29) and +/- Prolia      Earlie Server, MD  12/29/2017, 4:35 PM

## 2020-09-01 ENCOUNTER — Encounter: Payer: Self-pay | Admitting: Hematology and Oncology

## 2020-09-07 ENCOUNTER — Encounter: Payer: Self-pay | Admitting: Hematology and Oncology

## 2020-09-07 ENCOUNTER — Telehealth: Payer: Self-pay | Admitting: Student

## 2020-09-07 NOTE — Telephone Encounter (Signed)
Spoke with patient's son, Jonni Ibach, regarding the Palliative referral/services and all questions were answered and he was in agreement with scheduling visit.  Scheduled In-home Consult for 09/12/20 @ 12:30 PM

## 2020-09-12 ENCOUNTER — Other Ambulatory Visit: Payer: Self-pay

## 2020-09-12 ENCOUNTER — Other Ambulatory Visit: Payer: Medicare Other | Admitting: Student

## 2020-09-12 DIAGNOSIS — G20A1 Parkinson's disease without dyskinesia, without mention of fluctuations: Secondary | ICD-10-CM

## 2020-09-12 DIAGNOSIS — N390 Urinary tract infection, site not specified: Secondary | ICD-10-CM

## 2020-09-12 DIAGNOSIS — Z515 Encounter for palliative care: Secondary | ICD-10-CM

## 2020-09-12 DIAGNOSIS — R630 Anorexia: Secondary | ICD-10-CM

## 2020-09-12 DIAGNOSIS — G20 Parkinson's disease: Secondary | ICD-10-CM

## 2020-09-12 DIAGNOSIS — K5909 Other constipation: Secondary | ICD-10-CM

## 2020-09-12 NOTE — Progress Notes (Signed)
  [facility] Palliative [facility] Consult Note Telephone: [phone]  Fax: [phone]    Date of encounter: 09/12/20 PATIENT NAME: Laura Cisneros [address] [place] [address]   [phone] (home)  DOB: 06/04/1925 MRN: [id number] PRIMARY CARE PROVIDER:    Anderson, Marshall W, MD,  [address] [facility] [place] [address] [phone]  REFERRING PROVIDER:   Julie Barr, NP   RESPONSIBLE PARTY:    Contact Information     Name Relation Home Work Mobile   Laura Cisneros Son [phone]     Laura Cisneros Son [phone]   Laura Cisneros Son [phone]          I met face to face with patient and family in the home. Palliative Care was asked to follow this patient by consultation request of  Laura Barr, NP to address advance care planning and complex medical decision making. This is a follow up visit.                                   ASSESSMENT AND PLAN / RECOMMENDATIONS:   Advance Care Planning/Goals of Care: Goals include to maximize quality of life and symptom management. Our advance care planning conversation included a discussion about:    The value and importance of advance care planning  Experiences with loved ones who have been seriously ill or have died  Exploration of personal, cultural or spiritual beliefs that might influence medical decisions  Exploration of goals of care in the event of a sudden injury or illness  Son [name], has HCPOA Review and updating or creation of an  advance directive document . Decision not to resuscitate or to de-escalate disease focused treatments due to poor prognosis. CODE STATUS: DNR  Education provided on Palliative Medicine vs. Hospice services. Palliative Medicine will continue to provide support; monitor for changes and declines.   Symptom Management/Plan:  Parkinson's disease-continue carbidopa-levodopa as directed. Family to continue to  provide supportive care. Monitor for further functional decline. Follow up with neurology as scheduled.   Appetite-continue foods patient enjoys; continue nutritional supplement BID.   Constipation-continue senna-S 2 tablets daily; lactulose PRN. Adequate fluids encouraged.  Recurrent UTI's-patient presenting with some confusion, son does not feel she is back to her baseline. We discussed obtaining another UA sample. Recommend starting cranberry supplement daily, adequate fluids encouraged. Discussed stopping the bactrim she has been taking daily; son is in agreement. Will obtain last C & S report.   Follow up Palliative Care Visit: Palliative care will continue to follow for complex medical decision making, advance care planning, and clarification of goals. Return in 4 weeks or prn.  I spent 75 minutes providing this consultation. More than 50% of the time in this consultation was spent in counseling and care coordination.   PPS: 40%  HOSPICE ELIGIBILITY/DIAGNOSIS: TBD  Chief Complaint: Palliative Medicine initial consult.  HISTORY OF PRESENT ILLNESS:  Laura Cisneros is a 87 y.o. year old female  with parkinson's disease, chronic diastolic HF, hypersensitive heart disease, atrial fibrillation, dysphagia, hx of breast cancer, DVT, PE-on, hx of frequent UTI's. Patient recently discharged from hospice on 09/06/20 due to stability.   Patient resides with son; family is involved in her care. She is out of bed daily to wheel chair; requires assist to transfer. She is able to pedal self in wheelchair. Fair appetite; her weight has been steady around   110 pounds. She also drinks two supplements a day.   Does two supplements a day. Son reports patient's appetite has not been as good since she has been on antibiotics. She is currently still taking sulfamethoxazole/ trimethoprim. She completed course last month and has been taking 1 tablet daily since. Son states she usually presents with confusion,  agitation when she has a UTI. She denies any urinary complaints, dysuria, hematuria. No recent falls.   Will check with our nurse to check urine, has already completed one bottle. Culture and sensitivity report from hospice.   History obtained from review of EMR, discussion with primary team, and interview with family, facility staff/caregiver and/or Laura Cisneros.  I reviewed available labs, medications, imaging, studies and related documents from the EMR.  Records reviewed and summarized above.   ROS  General: NAD EYES: denies vision changes ENMT: denies dysphagia Cardiovascular: denies chest pain Pulmonary: denies cough, denies increased SOB Abdomen: occasional constipation GU: denies dysuria MSK: weakness,  no falls reported Skin: denies rashes; skin tear to right forearm Neurological: denies pain, insomnia, tremor Psych: Endorses stable mood Heme/lymph/immuno: denies bruises, abnormal bleeding  Physical Exam: Pulse 72, resp 16, sats 97% on room air Constitutional: NAD General: frail appearing, thin EYES: anicteric sclera, lids intact, no discharge  ENMT: intact hearing, oral mucous membranes moist CV: S1S2, RRR, no LE edema Pulmonary: LCTA, no increased work of breathing, no cough, room air Abdomen: normo-active BS + 4 quadrants, soft and non tender GU: deferred MSK: sarcopenia, moves all extremities, non-ambulatory Skin: warm and dry, no rashes or wounds on visible skin Neuro: generalized weakness, A & O x 3 Psych: non-anxious affect, pleasant affect Hem/lymph/immuno: no widespread bruising   Thank you for the opportunity to participate in the care of Laura Cisneros.  The palliative care team will continue to follow. Please call our office at [phone] if we can be of additional assistance.   Laura Sl[place], NP   COVID-19 PATIENT SCREENING TOOL Asked and negative response unless otherwise noted:   Have you had symptoms of covid, tested positive or been in contact with  someone with symptoms/positive test in the past 5-10 days? No

## 2020-09-19 ENCOUNTER — Telehealth: Payer: Self-pay | Admitting: Student

## 2020-09-19 NOTE — Telephone Encounter (Signed)
Late entry for 09/18/20 at 353pm. Palliative NP spoke with son [name]. Reviewed most recent urine culture results. Son expresses that patient is still showing some sx of uti; confusion and dysuria. Will notify palliative RN to check availability of her schedule to go out to home to collect specimen as patient is homebound. Fluids are encouraged and adding cranberry supplement.

## 2020-09-20 ENCOUNTER — Telehealth: Payer: Self-pay

## 2020-09-20 NOTE — Telephone Encounter (Signed)
1211 pm.  Phone call made to son [name] to schedule a visit for a urine specimen collection at request of Charlann Boxer, NP.  Per son, patient will need and in and out cath completed.  Visit is scheduled for tomorrow at 930 am.   1218 pm.  Notified Charlann Boxer, NP of need to complete I & O cath.  Approval received.

## 2020-09-21 ENCOUNTER — Other Ambulatory Visit: Payer: Medicare Other

## 2020-09-21 ENCOUNTER — Other Ambulatory Visit: Payer: Self-pay

## 2020-09-21 ENCOUNTER — Encounter: Payer: Self-pay | Admitting: Hematology and Oncology

## 2020-09-21 DIAGNOSIS — Z515 Encounter for palliative care: Secondary | ICD-10-CM

## 2020-09-21 NOTE — Progress Notes (Signed)
915 am.  Arrived at patient's home to collect and I & O urine specimen.  Patient is found sitting on the edge of the bed.  Patient without any complaints this am.  Explained procedure and patient assisted with laying back in the bed and raising legs.  I & O cath completed without difficulty.  Clear, yellow urine noted.  Specimen cup and vial filled.  Patient assisted into a sitting position and transferred to her transport chair and then to recliner chair.  Advised patient and son that results will be provided when available.  Specimen taken to [facility].  Results to be faxed to [facility].

## 2020-09-25 ENCOUNTER — Telehealth: Payer: Self-pay

## 2020-09-25 NOTE — Telephone Encounter (Signed)
224 pm.  Urine results back and reviewed by Charlann Boxer, NP.  Results are negative for UTI.  NP requested son be updated.  Phone call made to [name] and updated on results.  Reviewed s/s of UTI and advised to notify Palliative Care should symptoms present.  Son verbalized understanding and no other concerns are voiced.

## 2020-10-04 ENCOUNTER — Telehealth: Payer: Self-pay

## 2020-10-04 NOTE — Telephone Encounter (Signed)
Late Entry for 10/03/20 @ 1152 am.  Phone call made to son Laura Cisneros to follow up on message left Friday regarding concerns over a UTI.  Per son, patient started with hallucinations on Tuesday.  She developed frequency of urination and constipation over the weekend.  Constipation was addressed and frequency of urination resolved.  Patient has been afebrile per son.  Patient is back to her baseline today.  Advised that hallucinations may be disease progression.  Advised that I would follow up with NP to see if U/A still needed to be obtained.  Notified Charlann Boxer, NP of above.  No need for U/A at this time but have patient call back should symptoms of UTI present in patient.   10/04/20 110 pm.  Phone call made to son Laura Cisneros to advised of recommendation from NP.   NP will see patient next week and son will follow up with any new concerns at that time.

## 2020-10-09 ENCOUNTER — Other Ambulatory Visit: Payer: Self-pay

## 2020-10-09 ENCOUNTER — Other Ambulatory Visit: Payer: Medicare Other | Admitting: Student

## 2020-10-09 DIAGNOSIS — Z515 Encounter for palliative care: Secondary | ICD-10-CM

## 2020-10-09 DIAGNOSIS — N390 Urinary tract infection, site not specified: Secondary | ICD-10-CM

## 2020-10-09 DIAGNOSIS — K5901 Slow transit constipation: Secondary | ICD-10-CM

## 2020-10-09 DIAGNOSIS — G20 Parkinson's disease: Secondary | ICD-10-CM

## 2020-10-09 NOTE — Progress Notes (Signed)
Therapist, nutritional Palliative Care Consult Note Telephone: [phone]  Fax: [phone]    Date of encounter: 10/09/20 12:37 PM PATIENT NAME: Laura Cisneros [address] [place] [address]   [phone] (home)  DOB: 1925/04/09 MRN: [id number] PRIMARY CARE PROVIDER:    Lauro Regulus, MD,  [address] [facility] [place] [address] [phone]  REFERRING PROVIDER:   Marletta Lor, NP  RESPONSIBLE PARTY:    Contact Information     Name Relation Home Work Mobile   Ailis, Rigaud [phone]     Mamie, Hundertmark [phone]   Venissa, Nappi [phone]          I met face to face with patient and family in the home. Palliative Care was asked to follow this patient by consultation request of  Marletta Lor, NP to address advance care planning and complex medical decision making. This is a follow up visit.                                   ASSESSMENT AND PLAN / RECOMMENDATIONS:   Advance Care Planning/Goals of Care: Goals include to maximize quality of life and symptom management.   CODE STATUS: DNR  Symptom Management/Plan:  Parkinson's disease-continue carbidopa-levodopa as directed. Family to continue providing supportive care. Monitor for functional/cognitive changes and declines. Discussed options if hallucinations continue as it may be related to her Parkinson's. Will assess for UTI when confused, agitated, hallucinating. Patient's son states she will need refills on her carbidopa-levodopa and ropinirole.  Constipation-continue Senna-S 2 tabs BID; lactulose 30 ml PRN. Diet with fiber and adequate fluids encouraged.   Recurrent UTI's- will check U/A as needed. Continue cranberry tablet 2 tablets daily.  Follow up Palliative Care Visit: Palliative care will continue to follow for complex medical decision making, advance care planning, and clarification of goals. Return in 4-6 weeks  or prn.  I spent 60 minutes providing this consultation. More than 50% of the time in this consultation was spent in counseling and care coordination.   PPS: 40%  HOSPICE ELIGIBILITY/DIAGNOSIS: TBD  Chief Complaint: Palliative Medicine follow up visit.   HISTORY OF PRESENT ILLNESS:  Laura Cisneros is a 87 y.o. year old female with parkinson's disease, chronic diastolic HF, hypersensitive heart disease, atrial fibrillation, dysphagia, hx of breast cancer, DVT, PE-on, hx of frequent UTI's. Patient recently discharged from hospice on 09/06/20 due to stability.  Patient has been back to her baseline in the past 1.5 weeks. She had a urine sample collected; negative for UTI. She denies pain, shortness of breath, nausea. She does endorse constipation. Her appetite has been fair; she is also drinking nutritional supplements BID. No recent falls or injury. A 10-point review of systems is negative, except for the pertinent positives and negatives detailed in the HPI.    History obtained from review of EMR, discussion with primary team, and interview with family, facility staff/caregiver and/or Ms. Yetta Barre.  I reviewed available labs, medications, imaging, studies and related documents from the EMR.  Records reviewed and summarized above.    Physical Exam: Pulse 72, resp 16, b/p 112/80, sats 98% on room air Constitutional: NAD General: frail appearing, thin EYES: anicteric sclera, lids intact, no discharge  ENMT: intact hearing, oral mucous membranes moist, dentition intact CV: S1S2, RRR, no LE edema Pulmonary: LCTA, no increased work of breathing, no cough Abdomen: normo-active  BS + 4 quadrants, soft and non tender GU: deferred MSK: sarcopenia, moves all extremities,non-ambulatory Skin: warm and dry, no rashes or wounds on visible skin Neuro: generalized weakness, A & O x 3 Psych: non-anxious affect, pleasant Hem/lymph/immuno: no widespread bruising   Thank you for the opportunity to  participate in the care of [name].  The palliative care team will continue to follow. Please call our office at [phone] if we can be of additional assistance.   Ezekiel Slocumb, NP   COVID-19 PATIENT SCREENING TOOL Asked and negative response unless otherwise noted:   Have you had symptoms of covid, tested positive or been in contact with someone with symptoms/positive test in the past 5-10 days? No

## 2020-10-10 ENCOUNTER — Telehealth: Payer: Self-pay

## 2020-10-10 NOTE — Telephone Encounter (Signed)
Update sent to patient's neurologist regarding meds needing refills.

## 2020-10-12 ENCOUNTER — Telehealth: Payer: Self-pay

## 2020-10-12 NOTE — Telephone Encounter (Signed)
Message sent to Alvester Chou NP to request refills of medications. This RN was notified that Alvester Chou is not serving as PCP for this patient any longer.

## 2020-10-12 NOTE — Telephone Encounter (Signed)
Phone call placed to patient's son, [name], to provide update that Almyra Free is no longer serving as patient's attending, as she was only covering when patient was enrolled in hospice. Directed [name] to reach back out to Dr. Ouida Sills to re-establish as PCP. Also, made [name] aware that this RN followed back up with [facility] office re: need for refills. VM left containing the above information and request to call back if unable to get medications.

## 2020-10-12 NOTE — Telephone Encounter (Signed)
Phone call placed to [facility] office to f/u regarding request for Carbidopa-levodopa and ropinirole. Spoke with assistant who will relay message to nurse and send in refills to specified pharmacy.

## 2020-10-30 ENCOUNTER — Encounter: Payer: Self-pay | Admitting: Hematology and Oncology

## 2020-10-30 NOTE — Progress Notes (Signed)
10/31/2020 3:31 PM   Laura Cisneros 21-Jul-1925 [id number]  Referring provider: Kirk Ruths, MD [place] [facility] [place],  [place] [address]  Chief Complaint  Patient presents with   Recurrent UTI   Urological history: 1. rUTI's -contributing factors of age, vaginal atrophy, incontinence, constipation, limited fluid intake and poor perineal hygiene -documented positive UTI's over the last year  -none  2. Vaginal atrophy -managed with vaginal estrogen cream twice weekly  3. Incontinence -contributing factors of age, vaginal atrophy, dementia, arthritis, Parkinson's, stroke and diuretics -PVR 60 mL   HPI: Laura Cisneros is a 87 y.o. female who presents today for possible UTI with her son, Laura Cisneros.    CATH UA > 30 WBC's and moderate bacteria.   She has been living at [facility] and [facility] for the last two years.  Per son, she had been battling UTI's.  He states her symptoms consist of hallucinations and irritability.    She states that a few days ago, she was seeing ant and maggots on the floor.  She even admitted to seeing ants on the floor today during her visit.  Patient denies any modifying or aggravating factors.  Patient denies any gross hematuria, dysuria or suprapubic/flank pain.  Patient denies any fevers, chills, nausea or vomiting.    Her son states that she had multiple UTIs while she was at her SNIF's as she was even on suppressive Cipro at one time.  She is taking cranberry tablets and applying the vaginal estrogen cream as preventative measures.  PMH: Past Medical History:  Diagnosis Date   A-fib ([place])    unspecified   Anemia    unspecified   Arthritis    osteoarthritis   Bleeding hemorrhoid    Breast cancer ([place])    unspecified   Cancer ([place])    Breast   Cataract    Diverticulosis    DVT (deep vein thrombosis) in pregnancy    Hiatal hernia    Hypertension    Incontinence    Osteoarthritis    Osteoporosis     a. Intolerance to Fosamax b. Bilateral foot fractures c. IV Boniva d. Low vitamin D e. Reclast   Parkinson disease ([place])    PE (pulmonary thromboembolism) ([place])    Post-menopausal atrophic vaginitis 01/28/2017   Scleritis    Idiopathic a. Prednisone b. s/p methotrexate   TIA (transient ischemic attack) 1997   Possible   Urinary urgency    Vitamin B12 deficiency     Surgical History: Past Surgical History:  Procedure Laterality Date   ABDOMINAL HYSTERECTOMY     ABDOMINAL HYSTERECTOMY     partial   BLADDER TACK SURGERY     Dr. Ouida Sills   BREAST BIOPSY Left 10/2012   benign   BREAST BIOPSY Left 11/22/2015   invasive mammary ca    BREAST EXCISIONAL BIOPSY Left 12/08/2015   lumpectomy   BREAST LUMPECTOMY Left 11/2015   Invasive mammary carcinoma Grade I   EYE SURGERY Bilateral    Catarct Extraction with IOL   FINGER SURGERY Right    FOOT FRACTURE SURGERY Right 06/2003   pinning, Dr. Francia Greaves, [facility]   FOOT SURGERY Left    INCONTINENCE SURGERY     NEUROMA SURGERY     PACEMAKER INSERTION Right 10/02/2016   Procedure: INSERTION PACEMAKER;  Surgeon: Isaias Cowman, MD;  Location: [facility] ORS;  Service: Cardiovascular;  Laterality: Right;   PARTIAL MASTECTOMY WITH AXILLARY SENTINEL LYMPH NODE BIOPSY Left  12/08/2015   Procedure: PARTIAL MASTECTOMY WITH AXILLARY SENTINEL LYMPH NODE BIOPSY;  Surgeon: [name], MD;  Location: [facility] ORS;  Service: General;  Laterality: Left;    Home Medications:  Allergies as of 10/31/2020   No Known Allergies      Medication List        Accurate as of October 31, 2020 11:59 PM. If you have any questions, ask your nurse or doctor.          acetaminophen 325 MG tablet Commonly known as: TYLENOL Take 650 mg by mouth every 6 (six) hours as needed.   bisacodyl 10 MG suppository Commonly known as: DULCOLAX Place 10 mg rectally daily as needed for moderate constipation.   calcium carbonate 1500 (600 Ca) MG Tabs tablet Commonly  known as: OSCAL Take 2 tablets by mouth daily.   carbidopa-levodopa 50-200 MG tablet Commonly known as: SINEMET CR Take 3 tablets by mouth 3 (three) times daily.   Clear Eyes Complete Soln Place 2 drops into both eyes every 2 (two) hours as needed.   conjugated estrogens 0.625 MG/GM vaginal cream Commonly known as: PREMARIN Apply 0.5 mg ( pea-sized amount) just inside the vaginal introitus with finger-tip weekly on Monday nights   diltiazem 120 MG tablet Commonly known as: CARDIZEM Take 120 mg by mouth daily.   furosemide 40 MG tablet Commonly known as: LASIX Take 40 mg by mouth daily.   Hibiclens 4 % external liquid Generic drug: chlorhexidine Apply topically daily as needed. Started by: Zara Council, PA-C   latanoprost 0.005 % ophthalmic solution Commonly known as: XALATAN Place 1 drop into both eyes at bedtime. Per Dr. Wallace Going   Lidocaine HCl 4 % Crea Apply liberal amount topically  to shoulders/ neck or any other area of pain TID prn for pain/OA   melatonin 5 MG Tabs 5 mg.   mirabegron ER 25 MG Tb24 tablet Commonly known as: MYRBETRIQ Take 25 mg by mouth daily.   NON FORMULARY Diet Type: Regular diet. Small portions   ondansetron 4 MG tablet Commonly known as: ZOFRAN Take 4 mg by mouth every 6 (six) hours as needed for nausea or vomiting.   oxybutynin 5 MG tablet Commonly known as: DITROPAN Take 5 mg by mouth 2 (two) times daily.   potassium chloride SA 20 MEQ tablet Commonly known as: KLOR-CON Take 20 mEq by mouth daily.   rivaroxaban 20 MG Tabs tablet Commonly known as: XARELTO Take 20 mg by mouth daily with supper.   rOPINIRole 0.5 MG tablet Commonly known as: REQUIP Take 1 tablet (0.5 mg total) by mouth at bedtime.   senna-docusate 8.6-50 MG tablet Commonly known as: Senokot-S Take 2 tablets by mouth daily.   VITAMIN B-12 IJ Inject 1 mL into the muscle every 30 (thirty) days. Once a day on the 1st of the month   vitamin B-12 500 MCG  tablet Commonly known as: CYANOCOBALAMIN Take 500 mcg by mouth daily.        Allergies: No Known Allergies  Family History: Family History  Problem Relation Age of Onset   Esophageal cancer Sister    Kidney disease Neg Hx    Bladder Cancer Neg Hx    Breast cancer Neg Hx     Social History:  reports that she has never smoked. She has never used smokeless tobacco. She reports that she does not drink alcohol and does not use drugs.  ROS: Pertinent ROS in HPI  Physical Exam: BP 140/73   Pulse 61  Ht 5' (1.524 m)   Wt 112 lb (50.8 kg)   BMI 21.87 kg/m   Constitutional:  Well nourished. Alert and oriented, No acute distress. HEENT: [place] AT, mask in place.  Trachea midline Cardiovascular: No clubbing, cyanosis, or edema. Respiratory: Normal respiratory effort, no increased work of breathing. Neurologic: Grossly intact, no focal deficits, moving all 4 extremities. Psychiatric: Normal mood and affect.    Laboratory Data: Lab Results  Component Value Date   WBC 6.7 05/29/2020   HGB 13.5 05/29/2020   HCT 41.2 05/29/2020   MCV 98.8 05/29/2020   PLT 248 05/29/2020    Lab Results  Component Value Date   CREATININE 0.70 05/29/2020    Lab Results  Component Value Date   AST 14 (L) 05/29/2020   Lab Results  Component Value Date   ALT <5 05/29/2020    Urinalysis Component     Latest Ref Rng & Units 10/31/2020  Specific Gravity, UA     1.005 - 1.030 1.010  pH, UA     5.0 - 7.5 6.5  Color, UA     Yellow Yellow  Appearance Ur     Clear Cloudy (A)  Leukocytes,UA     Negative 1+ (A)  Protein,UA     Negative/Trace Negative  Glucose, UA     Negative Negative  Ketones, UA     Negative Negative  RBC, UA     Negative Negative  Bilirubin, UA     Negative Negative  Urobilinogen, Ur     0.2 - 1.0 mg/dL 0.2  Nitrite, UA     Negative Negative  Microscopic Examination      See below:   Component     Latest Ref Rng & Units 10/31/2020  WBC, UA     0 - 5 /hpf  >30 (A)  RBC     0 - 2 /hpf 0-2  Epithelial Cells (non renal)     0 - 10 /hpf 0-10  Bacteria, UA     None seen/Few Moderate (A)  I have reviewed the labs.   Pertinent Imaging: N/A  Assessment & Plan:    1. rUTI's -I do not have patient's prior urine culture results available to me at this appointment -We will add perineal cleaning with Hibiclens and squeegee bottle -UA with pyuria -Urine sent for culture, will hold on prescribing antibiotic at this time as she has been exposed to many antibiotics in the past -We also had a discussion of UTI versus colonization and he states they also are seeing neurology, primary care,ophthalmology, etc. when she has these hallucinogenic episodes to ensure there are no other causes contributing to this symptom  2. Vaginal atrophy -Continue vaginal estrogen cream twice weekly  3. Incontinence -Managed conservatively                                             Return for Pending urine culture results.  These notes generated with voice recognition software. I apologize for typographical errors.  Zara Council, PA-C  [facility] [address]  [place], [place] [address] [phone]

## 2020-10-31 ENCOUNTER — Other Ambulatory Visit: Payer: Self-pay

## 2020-10-31 ENCOUNTER — Encounter: Payer: Self-pay | Admitting: Urology

## 2020-10-31 ENCOUNTER — Ambulatory Visit (INDEPENDENT_AMBULATORY_CARE_PROVIDER_SITE_OTHER): Payer: Medicare Other | Admitting: Urology

## 2020-10-31 ENCOUNTER — Encounter: Payer: Self-pay | Admitting: Hematology and Oncology

## 2020-10-31 VITALS — BP 140/73 | HR 61 | Ht 60.0 in | Wt 112.0 lb

## 2020-10-31 DIAGNOSIS — N952 Postmenopausal atrophic vaginitis: Secondary | ICD-10-CM

## 2020-10-31 DIAGNOSIS — N390 Urinary tract infection, site not specified: Secondary | ICD-10-CM | POA: Diagnosis not present

## 2020-10-31 DIAGNOSIS — N3946 Mixed incontinence: Secondary | ICD-10-CM

## 2020-10-31 MED ORDER — HIBICLENS 4 % EX LIQD: Freq: Every day | CUTANEOUS | 0 refills | Status: AC | PRN

## 2020-10-31 NOTE — Patient Instructions (Signed)
[place]

## 2020-11-01 LAB — URINALYSIS, COMPLETE
Bilirubin, UA: NEGATIVE
Glucose, UA: NEGATIVE
Ketones, UA: NEGATIVE
Nitrite, UA: NEGATIVE
Protein,UA: NEGATIVE
RBC, UA: NEGATIVE
Specific Gravity, UA: 1.01 (ref 1.005–1.030)
Urobilinogen, Ur: 0.2 mg/dL (ref 0.2–1.0)
pH, UA: 6.5 (ref 5.0–7.5)

## 2020-11-01 LAB — MICROSCOPIC EXAMINATION: WBC, UA: >30 /hpf — AB (ref 0–5)

## 2020-11-09 ENCOUNTER — Other Ambulatory Visit: Payer: Self-pay

## 2020-11-09 ENCOUNTER — Other Ambulatory Visit: Payer: Medicare Other | Admitting: Student

## 2020-11-09 DIAGNOSIS — N390 Urinary tract infection, site not specified: Secondary | ICD-10-CM

## 2020-11-09 DIAGNOSIS — Z515 Encounter for palliative care: Secondary | ICD-10-CM

## 2020-11-09 DIAGNOSIS — G20A1 Parkinson's disease without dyskinesia, without mention of fluctuations: Secondary | ICD-10-CM

## 2020-11-09 DIAGNOSIS — G20 Parkinson's disease: Secondary | ICD-10-CM

## 2020-11-09 NOTE — Progress Notes (Signed)
[facility] Consult Note Telephone: [phone]  Fax: [phone]    Date of encounter: 11/09/20 1:19 PM PATIENT NAME: Laura Cisneros [address] [place] [address]   [phone] (home)  DOB: July 29, 1925 MRN: [id number] PRIMARY CARE PROVIDER:      REFERRING PROVIDER:   Alvester Chou, NP  RESPONSIBLE PARTY:    Contact Information     Name Relation Home Work Mobile   Laura, Cisneros [phone]     Laura, Cisneros [phone]   Laura, Cisneros [phone]          I met face to face with patient and family in the home. Palliative Care was asked to follow this patient by consultation request of  PCP to address advance care planning and complex medical decision making. This is a follow up visit.                                   ASSESSMENT AND PLAN / RECOMMENDATIONS:   Advance Care Planning/Goals of Care: Goals include to maximize quality of life and symptom management.   CODE STATUS: DNR  Symptom Management/Plan:  Parkinson's disease-continue carbidopa-levodopa as directed. Family to continue providing supportive care. Monitor for functional/cognitive changes and declines. Discussed hallucinations being related to Parkinson's vs. Acute UTI. Follow up with Neurology as scheduled.   Recurrent UTI's- patient currently awaiting C & S results. Continue cranberry tablet 2 tablets daily, premarin cream as directed. Patient to start using Hibiclens. Encourage adequate fluids. Son is considering adding D-mannose to her regimen.   Follow up Palliative Care Visit: Palliative care will continue to follow for complex medical decision making, advance care planning, and clarification of goals. Return 4 weeks or prn.  I spent 60 minutes providing this consultation. More than 50% of the time in this consultation was spent in counseling and care coordination.   PPS: 40%  HOSPICE ELIGIBILITY/DIAGNOSIS:  TBD  Chief Complaint: Palliative Medicine follow up.   HISTORY OF PRESENT ILLNESS:  Laura Cisneros is a 87 y.o. year old female  with parkinson's disease, chronic diastolic HF, hypersensitive heart disease, atrial fibrillation, dysphagia, hx of breast cancer, DVT, PE-on, hx of frequent UTI's. Patient recently discharged from hospice on 09/06/20 due to stability.  Patient states that she has been doing okay. Supportive family. She is currently awaiting culture and sensitivity results. She currently denies any symptoms urinary symptoms at this time. She and son Laura Cisneros report no recent hallucinations or delusions. No fever or chills reported.She denies pain,shortness of breath, nausea. She does endorse occasional constipation.She is receiving senna-S; lactulose if no bowel movement within 2 to 3 days, which is usually effective. Fair appetite reported. A 10-point review of systems is negative, except for the pertinent positives and negatives detailed in the HPI.      History obtained from review of EMR, discussion with primary team, and interview with family, facility staff/caregiver and/or Laura Cisneros.  I reviewed available labs, medications, imaging, studies and related documents from the EMR.  Records reviewed and summarized above.    Physical Exam: Pulse 66, resp 16, b/p 120/70. Sats 96% Constitutional: NAD General: frail appearing, thin EYES: anicteric sclera, lids intact, no discharge  ENMT: intact hearing, oral mucous membranes moist, dentition intact CV: S1S2, RRR, no LE edema Pulmonary: LCTA, no increased work of breathing, no cough, room air Abdomen: normo-active BS + 4 quadrants, soft and non  tender GU: deferred MSK: sarcopenia, moves all extremities, ambulatory Skin: warm and dry, no rashes or wounds on visible skin Neuro:  generalized weakness, A & O x 3, forgetfulness Psych: non-anxious affect, pleasant Hem/lymph/immuno: no widespread bruising   Thank you for the opportunity to  participate in the care of Laura Cisneros.  The palliative care team will continue to follow. Please call our office at [phone] if we can be of additional assistance.   Laura Slocumb, NP   COVID-19 PATIENT SCREENING TOOL Asked and negative response unless otherwise noted:   Have you had symptoms of covid, tested positive or been in contact with someone with symptoms/positive test in the past 5-10 days? No

## 2020-11-10 ENCOUNTER — Telehealth: Payer: Self-pay

## 2020-11-10 LAB — CULTURE, URINE COMPREHENSIVE

## 2020-11-10 MED ORDER — NITROFURANTOIN MONOHYD MACRO 100 MG PO CAPS: 100.0000 mg | ORAL_CAPSULE | Freq: Two times a day (BID) | ORAL | 0 refills | Status: DC

## 2020-11-10 NOTE — Telephone Encounter (Signed)
Pt son called asking for urine culture results. It is still prelim in epic, I called [facility] and it was finalized on [facility] end. I did have [facility] fax over the sensitivity. I had Samantha, PA review the urine culture. As per Sam start pt on Macrobid BID x 7 days. Medication sent to the pharmacy. Pt son aware    Pt son would like a pill for yeast infection after she finishes the antibiotic. He would like to know if she needs to follow up

## 2020-11-20 ENCOUNTER — Encounter: Payer: Self-pay | Admitting: Hematology and Oncology

## 2020-11-29 ENCOUNTER — Encounter: Payer: Self-pay | Admitting: Hematology and Oncology

## 2020-11-30 ENCOUNTER — Encounter: Payer: Self-pay | Admitting: Hematology and Oncology

## 2020-12-04 ENCOUNTER — Inpatient Hospital Stay: Payer: Medicare Other | Attending: Oncology

## 2020-12-04 ENCOUNTER — Encounter: Payer: Self-pay | Admitting: Oncology

## 2020-12-04 ENCOUNTER — Other Ambulatory Visit: Payer: Self-pay

## 2020-12-04 ENCOUNTER — Inpatient Hospital Stay: Payer: Medicare Other

## 2020-12-04 ENCOUNTER — Inpatient Hospital Stay (HOSPITAL_BASED_OUTPATIENT_CLINIC_OR_DEPARTMENT_OTHER): Payer: Medicare Other | Admitting: Oncology

## 2020-12-04 VITALS — BP 145/72 | HR 65 | Temp 97.2°F | Resp 16 | Wt 113.0 lb

## 2020-12-04 DIAGNOSIS — M810 Age-related osteoporosis without current pathological fracture: Secondary | ICD-10-CM

## 2020-12-04 DIAGNOSIS — Z86718 Personal history of other venous thrombosis and embolism: Secondary | ICD-10-CM | POA: Diagnosis not present

## 2020-12-04 DIAGNOSIS — E538 Deficiency of other specified B group vitamins: Secondary | ICD-10-CM

## 2020-12-04 DIAGNOSIS — C50412 Malignant neoplasm of upper-outer quadrant of left female breast: Secondary | ICD-10-CM

## 2020-12-04 DIAGNOSIS — Z170 Estrogen receptor positive status [ER+]: Secondary | ICD-10-CM

## 2020-12-04 DIAGNOSIS — E860 Dehydration: Secondary | ICD-10-CM

## 2020-12-04 DIAGNOSIS — M818 Other osteoporosis without current pathological fracture: Secondary | ICD-10-CM | POA: Insufficient documentation

## 2020-12-04 LAB — CBC WITH DIFFERENTIAL/PLATELET
Abs Immature Granulocytes: 0.03 10*3/uL (ref 0.00–0.07)
Basophils Absolute: 0 10*3/uL (ref 0.0–0.1)
Basophils Relative: 1 %
Eosinophils Absolute: 0.1 10*3/uL (ref 0.0–0.5)
Eosinophils Relative: 2 %
HCT: 40.8 % (ref 36.0–46.0)
Hemoglobin: 13.7 g/dL (ref 12.0–15.0)
Immature Granulocytes: 1 %
Lymphocytes Relative: 23 %
Lymphs Abs: 1.5 10*3/uL (ref 0.7–4.0)
MCH: 33.3 pg (ref 26.0–34.0)
MCHC: 33.6 g/dL (ref 30.0–36.0)
MCV: 99.3 fL (ref 80.0–100.0)
Monocytes Absolute: 0.6 10*3/uL (ref 0.1–1.0)
Monocytes Relative: 9 %
Neutro Abs: 4.1 10*3/uL (ref 1.7–7.7)
Neutrophils Relative %: 64 %
Platelets: 259 10*3/uL (ref 150–400)
RBC: 4.11 MIL/uL (ref 3.87–5.11)
RDW: 13.5 % (ref 11.5–15.5)
WBC: 6.4 10*3/uL (ref 4.0–10.5)
nRBC: 0 % (ref 0.0–0.2)

## 2020-12-04 LAB — COMPREHENSIVE METABOLIC PANEL
ALT: <5 U/L (ref 0–44)
AST: 12 U/L — ABNORMAL LOW (ref 15–41)
Albumin: 3.9 g/dL (ref 3.5–5.0)
Alkaline Phosphatase: 86 U/L (ref 38–126)
Anion gap: 13 (ref 5–15)
BUN: 22 mg/dL (ref 8–23)
CO2: 28 mmol/L (ref 22–32)
Calcium: 11 mg/dL — ABNORMAL HIGH (ref 8.9–10.3)
Chloride: 94 mmol/L — ABNORMAL LOW (ref 98–111)
Creatinine, Ser: 1.01 mg/dL — ABNORMAL HIGH (ref 0.44–1.00)
GFR, Estimated: 52 mL/min — ABNORMAL LOW (ref >60)
Glucose, Bld: 95 mg/dL (ref 70–99)
Potassium: 3.5 mmol/L (ref 3.5–5.1)
Sodium: 135 mmol/L (ref 135–145)
Total Bilirubin: 1 mg/dL (ref 0.3–1.2)
Total Protein: 7.4 g/dL (ref 6.5–8.1)

## 2020-12-04 MED ORDER — DENOSUMAB 60 MG/ML ~~LOC~~ SOSY
60.0000 mg | PREFILLED_SYRINGE | Freq: Once | SUBCUTANEOUS | Status: AC
  Administered 2020-12-04: 60 mg via SUBCUTANEOUS
  Filled 2020-12-04: qty 1

## 2020-12-04 MED ORDER — SODIUM CHLORIDE 0.9 % IV SOLN
Freq: Once | INTRAVENOUS | Status: AC
  Filled 2020-12-04: qty 250

## 2020-12-04 NOTE — Progress Notes (Signed)
Pt has no concerns at this time. 

## 2020-12-04 NOTE — Progress Notes (Signed)
[place]  Chief Complaint: Laura Cisneros is a 87 y.o. female with clinical stage I left breast cancer and osteoporosis who is seen for follow-up HPI: Patient has a history of breast cancer, stage I and osteoporosis presents for follow-up. Patient previously follows up with Dr. Mike Gip, switched to establish care with me on 11/30/2018. Extensive past medical history review was performed by me. # stage I left breast cancer s/p partial mastectomy and sentinel lymph node biopsy on 12/08/2015.  Pathology revealed a 0.7 cm grade I  invasive mammary carcinoma of no special type.  There was no lymphvascular invasion.  Margins were clear.  One sentinel lymph node was negative.  Tumor was ER positive (> 90%), PR negative, and HER-2/neu 2+ (negative by FISH).  Pathologic stage was T1bN0.    Bilateral diagnostic mammogram on 11/06/2015 revealed a suspicious 5 mm mass in the upper outer left breast.  Bilateral mammogram on 11/08/2016 revealed no evidence of malignancy in either breast.  Bilateral mammogram on 11/10/2017 revealed no evidence of malignancy within either breast.  There were stable postsurgical changes within the left breast.  Radiation therapy was deferred secondary to her age, Parkinson's disease, and small well differentiated ER/PR+ tumor.  She began tamoxifen on 01/12/2016.  Tamoxifen was discontinued on 06/18/2016 after a DVT and pulmonary embolism.  She declined further endocrine therapy.  She has osteoporosis and was previously on Zometa for 5 years.  Bone density on 12/05/2015 revealed osteoporosis with a T-score of -2.9 in AP spine L1-L4 and -2.5 in the right femoral neck.  Bone density on 12/09/2017 revealed osteoporosis with a T-score of -3.0 in the AP spine L1-L4 and -2.7 in the right femur.  She began Prolia on 12/19/2015  She has Parkinson's disease.  Gait is unstable.  She has a history of recurrent UTIs, vaginal atrophy, and incontinence.  She is  using estrogen cream sparingly (once a week).  She has a history of iron deficiency.  She notes 2 colonoscopies in the past. She has been on B12 shots for years and currently on oral vitamin B12 supplementation.   She was diagnosed with a DVT and pulmonary embolism on 06/05/2016.  Right lower extremity duplex revealed a DVT in the right lower calf veins.  CT angiogram revealed an acute embolism of the right lower lobar artery extending into the anterior, lateral and posterior basal segmental arteries within the right upper lobe anterior segmental artery.  There was no CT evidence of acute right heart strain.  She is on Xarelto.  I discussed with the patient and her son during her visit on 11/30/2018. Given her advanced age and multiple other medical problems, I ask if she would be open to additional breast surgeries/biopsies if any abnormalities are found on mammogram.  Patient expresses her wishes of not wanting to proceed with any invasive procedure at this point.  We discussed about not continuing annual diagnostic mammogram at this point.  # COVID-19 pneumonia in November 2021.  INTERVAL HISTORY Laura Cisneros is a 87 y.o. female who has above history reviewed by me today presents for follow up visit for B12 deficiency, breast cancer. Problems and complaints are listed below: Patient was accompanied by one of her sons Jeneen Rinks. Patient is a poor historian. Per patient's son, patient has decreased appetite and did not eat much recently  No nausea vomiting diarrhea.    Past Medical History:  Diagnosis Date   A-fib ([place])    unspecified  Anemia    unspecified   Arthritis    osteoarthritis   Bleeding hemorrhoid    Breast cancer ([facility])    unspecified   Cancer ([facility])    Breast   Cataract    Diverticulosis    DVT (deep vein thrombosis) in pregnancy    Hiatal hernia    Hypertension    Incontinence    Osteoarthritis    Osteoporosis    a. Intolerance to Fosamax b. Bilateral foot fractures c.  IV Boniva d. Low vitamin D e. Reclast   Parkinson disease ([place])    PE (pulmonary thromboembolism) ([place])    Post-menopausal atrophic vaginitis 01/28/2017   Scleritis    Idiopathic a. Prednisone b. s/p methotrexate   TIA (transient ischemic attack) 1997   Possible   Urinary urgency    Vitamin B12 deficiency     Past Surgical History:  Procedure Laterality Date   ABDOMINAL HYSTERECTOMY     ABDOMINAL HYSTERECTOMY     partial   BLADDER TACK SURGERY     Dr. Ouida Sills   BREAST BIOPSY Left 10/2012   benign   BREAST BIOPSY Left 11/22/2015   invasive mammary ca    BREAST EXCISIONAL BIOPSY Left 12/08/2015   lumpectomy   BREAST LUMPECTOMY Left 11/2015   Invasive mammary carcinoma Grade I   EYE SURGERY Bilateral    Catarct Extraction with IOL   FINGER SURGERY Right    FOOT FRACTURE SURGERY Right 06/2003   pinning, Dr. Francia Greaves, [facility]   FOOT SURGERY Left    INCONTINENCE SURGERY     NEUROMA SURGERY     PACEMAKER INSERTION Right 10/02/2016   Procedure: INSERTION PACEMAKER;  Surgeon: Isaias Cowman, MD;  Location: [facility] ORS;  Service: Cardiovascular;  Laterality: Right;   PARTIAL MASTECTOMY WITH AXILLARY SENTINEL LYMPH NODE BIOPSY Left 12/08/2015   Procedure: PARTIAL MASTECTOMY WITH AXILLARY SENTINEL LYMPH NODE BIOPSY;  Surgeon: [name], MD;  Location: [facility] ORS;  Service: General;  Laterality: Left;    Family History  Problem Relation Age of Onset   Esophageal cancer Sister    Kidney disease Neg Hx    Bladder Cancer Neg Hx    Breast cancer Neg Hx     Social History:  reports that she has never smoked. She has never used smokeless tobacco. She reports that she does not drink alcohol and does not use drugs.   Allergies: No Known Allergies  Current Medications: Current Outpatient Medications  Medication Sig Dispense Refill   acetaminophen (TYLENOL) 325 MG tablet Take 650 mg by mouth every 6 (six) hours as needed.      bisacodyl (DULCOLAX) 10 MG suppository Place 10 mg  rectally daily as needed for moderate constipation.     calcium carbonate (OSCAL) 1500 (600 Ca) MG TABS tablet Take 2 tablets by mouth daily.      carbidopa-levodopa (SINEMET CR) 50-200 MG tablet Take 3 tablets by mouth 3 (three) times daily.     chlorhexidine (HIBICLENS) 4 % external liquid Apply topically daily as needed. 120 mL 0   conjugated estrogens (PREMARIN) vaginal cream Apply 0.5 mg ( pea-sized amount) just inside the vaginal introitus with finger-tip weekly on Monday nights     Cyanocobalamin (VITAMIN B-12 IJ) Inject 1 mL into the muscle every 30 (thirty) days. Once a day on the 1st of the month     diltiazem (CARDIZEM) 120 MG tablet Take 120 mg by mouth daily.     furosemide (LASIX) 40 MG tablet Take 40 mg by mouth  daily.      Hyprom-Naphaz-Polysorb-Zn Sulf (CLEAR EYES COMPLETE) SOLN Place 2 drops into both eyes every 2 (two) hours as needed.     latanoprost (XALATAN) 0.005 % ophthalmic solution Place 1 drop into both eyes at bedtime. Per Dr. Wallace Going     Lidocaine HCl 4 % CREA Apply liberal amount topically  to shoulders/ neck or any other area of pain TID prn for pain/OA     melatonin 5 MG TABS 5 mg.     mirabegron ER (MYRBETRIQ) 25 MG TB24 tablet Take 25 mg by mouth daily.     nitrofurantoin, macrocrystal-monohydrate, (MACROBID) 100 MG capsule Take 1 capsule (100 mg total) by mouth every 12 (twelve) hours. 14 capsule 0   NON FORMULARY Diet Type: Regular diet. Small portions     ondansetron (ZOFRAN) 4 MG tablet Take 4 mg by mouth every 6 (six) hours as needed for nausea or vomiting.     oxybutynin (DITROPAN) 5 MG tablet Take 5 mg by mouth 2 (two) times daily.     potassium chloride SA (K-DUR,KLOR-CON) 20 MEQ tablet Take 20 mEq by mouth daily.      rivaroxaban (XARELTO) 20 MG TABS tablet Take 20 mg by mouth daily with supper.      rOPINIRole (REQUIP) 0.5 MG tablet Take 1 tablet (0.5 mg total) by mouth at bedtime.     senna-docusate (SENOKOT-S) 8.6-50 MG tablet Take 2 tablets by  mouth daily.     vitamin B-12 (CYANOCOBALAMIN) 500 MCG tablet Take 500 mcg by mouth daily.     No current facility-administered medications for this visit.   Review of Systems  Constitutional:  Positive for fatigue. Negative for appetite change, chills and fever.  HENT:   Negative for hearing loss and voice change.   Eyes:  Negative for eye problems.  Respiratory:  Negative for chest tightness and cough.   Cardiovascular:  Negative for chest pain.  Gastrointestinal:  Negative for abdominal distention, abdominal pain and blood in stool.  Endocrine: Negative for hot flashes.  Genitourinary:  Negative for difficulty urinating and frequency.   Musculoskeletal:  Positive for arthralgias.  Skin:  Negative for itching and rash.  Neurological:  Negative for extremity weakness.       Unsteady gait due to Parkinson's disease  Hematological:  Negative for adenopathy.  Psychiatric/Behavioral:  Negative for confusion.     Performance status (ECOG): 2  Physical Exam: Blood pressure (!) 145/72, pulse 65, temperature (!) 97.2 F (36.2 C), resp. rate 16, weight 113 lb (51.3 kg), SpO2 100 %. Physical Exam Constitutional:      General: She is not in acute distress.    Appearance: She is not diaphoretic.     Comments: Thin build, sitting in wheelchair.  HENT:     Head: Normocephalic and atraumatic.     Mouth/Throat:     Pharynx: No oropharyngeal exudate.  Eyes:     General: No scleral icterus.    Pupils: Pupils are equal, round, and reactive to light.  Cardiovascular:     Rate and Rhythm: Normal rate and regular rhythm.     Heart sounds: No murmur heard. Pulmonary:     Effort: Pulmonary effort is normal. No respiratory distress.     Breath sounds: No rales.  Chest:     Chest wall: No tenderness.  Abdominal:     General: There is no distension.     Palpations: Abdomen is soft.  Musculoskeletal:        General: Normal range  of motion.     Cervical back: Normal range of motion and neck  supple.  Skin:    General: Skin is warm and dry.     Findings: No erythema.  Neurological:     Mental Status: She is alert and oriented to person, place, and time.     Motor: No abnormal muscle tone.  Psychiatric:        Mood and Affect: Affect normal.   Breast exam was performed in seated and lying down position. Patient is status post left lumpectomy with a well-healed surgical scar. No evidence of any palpable masses. No evidence of any palpable masses or lumps in the right breast.  Previous small pea size right axillary lymphadenopathy has resolved.  No palpable lymphadenopathy on the left side.    Appointment on 12/04/2020  Component Date Value Ref Range Status   Sodium 12/04/2020 135  135 - 145 mmol/L Final   Potassium 12/04/2020 3.5  3.5 - 5.1 mmol/L Final   Chloride 12/04/2020 94 (A)  98 - 111 mmol/L Final   CO2 12/04/2020 28  22 - 32 mmol/L Final   Glucose, Bld 12/04/2020 95  70 - 99 mg/dL Final   Glucose reference range applies only to samples taken after fasting for at least 8 hours.   BUN 12/04/2020 22  8 - 23 mg/dL Final   Creatinine, Ser 12/04/2020 1.01 (A)  0.44 - 1.00 mg/dL Final   Calcium 12/04/2020 11.0 (A)  8.9 - 10.3 mg/dL Final   Total Protein 12/04/2020 7.4  6.5 - 8.1 g/dL Final   Albumin 12/04/2020 3.9  3.5 - 5.0 g/dL Final   AST 12/04/2020 12 (A)  15 - 41 U/L Final   ALT 12/04/2020 <5  0 - 44 U/L Final   Alkaline Phosphatase 12/04/2020 86  38 - 126 U/L Final   Total Bilirubin 12/04/2020 1.0  0.3 - 1.2 mg/dL Final   GFR, Estimated 12/04/2020 52 (A)  >60 mL/min Final   Comment: (NOTE) Calculated using the CKD-EPI Creatinine Equation (2021)    Anion gap 12/04/2020 13  5 - 15 Final   Performed at [facility], [place], [place] [address]   WBC 12/04/2020 6.4  4.0 - 10.5 K/uL Final   RBC 12/04/2020 4.11  3.87 - 5.11 MIL/uL Final   Hemoglobin 12/04/2020 13.7  12.0 - 15.0 g/dL Final   HCT 12/04/2020 40.8  36.0 - 46.0 % Final   MCV  12/04/2020 99.3  80.0 - 100.0 fL Final   MCH 12/04/2020 33.3  26.0 - 34.0 pg Final   MCHC 12/04/2020 33.6  30.0 - 36.0 g/dL Final   RDW 12/04/2020 13.5  11.5 - 15.5 % Final   Platelets 12/04/2020 259  150 - 400 K/uL Final   nRBC 12/04/2020 0.0  0.0 - 0.2 % Final   Neutrophils Relative % 12/04/2020 64  % Final   Neutro Abs 12/04/2020 4.1  1.7 - 7.7 K/uL Final   Lymphocytes Relative 12/04/2020 23  % Final   Lymphs Abs 12/04/2020 1.5  0.7 - 4.0 K/uL Final   Monocytes Relative 12/04/2020 9  % Final   Monocytes Absolute 12/04/2020 0.6  0.1 - 1.0 K/uL Final   Eosinophils Relative 12/04/2020 2  % Final   Eosinophils Absolute 12/04/2020 0.1  0.0 - 0.5 K/uL Final   Basophils Relative 12/04/2020 1  % Final   Basophils Absolute 12/04/2020 0.0  0.0 - 0.1 K/uL Final   Immature Granulocytes 12/04/2020 1  %  Final   Abs Immature Granulocytes 12/04/2020 0.03  0.00 - 0.07 K/uL Final   Performed at [facility], [address], [place], [place] [address]    Assessment and Plan.   1. Malignant neoplasm of upper-outer quadrant of left breast in female, estrogen receptor positive ([place])   2. Age-related osteoporosis without current pathological fracture   3. B12 deficiency   4. History of DVT (deep vein thrombosis)    # History of stage I left breast cancer: Clinically doing well. Currently not on any antiestrogen treatments due to side effects, and not on surveillance mammogram due to age and patient's preference.  Patient desires not to proceed with any aggressive diagnostic procedure at this point.  Monitor clinically.  # AKI, likely due to decreased oral intake.  # Hypercalcemia is likely due to dehydration.  IVF 551ml NS over 1 hour.   #History of vitamin B12 deficiency and previously on B12 monthly injections.  Currently on oral vitamin B12 supplementation.  Check Vitamin B12 at next visit.   #history of DVT and PE, on Xarelto, not managed by me. # osteoporosis, patient has been on Prolia  every 6 months.  Proceed with Prolia  today.   Due for repeating DEXA. Patient's son prefers to defer until prior to next visit. Will obtain.   RTC Repeat BMP in 1 week +/- IVF 6 months for MD labs (CBC with diff, CMP) and +/- Prolia      Earlie Server, MD  12/29/2017, 4:35 PM

## 2020-12-04 NOTE — Patient Instructions (Signed)

## 2020-12-05 ENCOUNTER — Encounter: Payer: Self-pay | Admitting: Hematology and Oncology

## 2020-12-11 ENCOUNTER — Inpatient Hospital Stay: Payer: Medicare Other

## 2020-12-11 ENCOUNTER — Other Ambulatory Visit: Payer: Self-pay

## 2020-12-11 VITALS — BP 138/78 | HR 60 | Temp 97.8°F | Resp 17

## 2020-12-11 DIAGNOSIS — E860 Dehydration: Secondary | ICD-10-CM

## 2020-12-11 DIAGNOSIS — Z170 Estrogen receptor positive status [ER+]: Secondary | ICD-10-CM

## 2020-12-11 DIAGNOSIS — M818 Other osteoporosis without current pathological fracture: Secondary | ICD-10-CM | POA: Diagnosis not present

## 2020-12-11 DIAGNOSIS — C50412 Malignant neoplasm of upper-outer quadrant of left female breast: Secondary | ICD-10-CM

## 2020-12-11 LAB — BASIC METABOLIC PANEL
Anion gap: 9 (ref 5–15)
BUN: 20 mg/dL (ref 8–23)
CO2: 32 mmol/L (ref 22–32)
Calcium: 10 mg/dL (ref 8.9–10.3)
Chloride: 94 mmol/L — ABNORMAL LOW (ref 98–111)
Creatinine, Ser: 0.97 mg/dL (ref 0.44–1.00)
GFR, Estimated: 54 mL/min — ABNORMAL LOW (ref >60)
Glucose, Bld: 117 mg/dL — ABNORMAL HIGH (ref 70–99)
Potassium: 4.2 mmol/L (ref 3.5–5.1)
Sodium: 135 mmol/L (ref 135–145)

## 2020-12-11 MED ORDER — SODIUM CHLORIDE 0.9 % IV SOLN
Freq: Once | INTRAVENOUS | Status: AC
  Filled 2020-12-11: qty 250

## 2020-12-11 NOTE — Progress Notes (Signed)
Patient doing much better eating and drinking. Labs improved. Pt received 1L of NS over 1 hour via peripheral IV line. Discharged to home via wheelchair. Accompanied by her son.

## 2020-12-11 NOTE — Progress Notes (Signed)
12/12/2020 2:10 PM   Laura Cisneros [birth year] [id number]  Referring provider: Kirk Ruths, MD [facility] [place],  [place] [address]  Chief Complaint  Patient presents with   Follow-up    Urological history: 1. rUTI's -contributing factors of age, vaginal atrophy, incontinence, constipation, limited fluid intake and poor perineal hygiene -documented positive UTI's over the last year  -Enterococcus faecalis 10/31/2020   2. Vaginal atrophy -managed with vaginal estrogen cream twice weekly  3. Incontinence -contributing factors of age, vaginal atrophy, dementia, arthritis, Parkinson's, stroke and diuretics -PVR 60 mL   HPI: Laura Cisneros is a 87 y.o. female who presents today for symptom recheck after an UTI with her son, Laura Cisneros.    UA nitrite positive, > 30 WBC's and many bacteria.    She states that she is feeling well, but her son states he has noticed episodes of visual hallucinations.  Patient denies any modifying or aggravating factors.  Patient denies any gross hematuria, dysuria or suprapubic/flank pain.  Patient denies any fevers, chills, nausea or vomiting.    PMH: Past Medical History:  Diagnosis Date   A-fib ([place])    unspecified   Anemia    unspecified   Arthritis    osteoarthritis   Bleeding hemorrhoid    Breast cancer ([place])    unspecified   Cancer ([place])    Breast   Cataract    Diverticulosis    DVT (deep vein thrombosis) in pregnancy    Hiatal hernia    Hypertension    Incontinence    Osteoarthritis    Osteoporosis    a. Intolerance to Fosamax b. Bilateral foot fractures c. IV Boniva d. Low vitamin D e. Reclast   Parkinson disease ([place])    PE (pulmonary thromboembolism) ([place])    Post-menopausal atrophic vaginitis 01/28/2017   Scleritis    Idiopathic a. Prednisone b. s/p methotrexate   TIA (transient ischemic attack) 1997   Possible   Urinary urgency    Vitamin B12 deficiency     Surgical  History: Past Surgical History:  Procedure Laterality Date   ABDOMINAL HYSTERECTOMY     ABDOMINAL HYSTERECTOMY     partial   BLADDER TACK SURGERY     Dr. Ouida Sills   BREAST BIOPSY Left 10/2012   benign   BREAST BIOPSY Left 11/22/2015   invasive mammary ca    BREAST EXCISIONAL BIOPSY Left 12/08/2015   lumpectomy   BREAST LUMPECTOMY Left 11/2015   Invasive mammary carcinoma Grade I   EYE SURGERY Bilateral    Catarct Extraction with IOL   FINGER SURGERY Right    FOOT FRACTURE SURGERY Right 06/2003   pinning, Dr. Francia Greaves, [facility]   FOOT SURGERY Left    INCONTINENCE SURGERY     NEUROMA SURGERY     PACEMAKER INSERTION Right 10/02/2016   Procedure: INSERTION PACEMAKER;  Surgeon: Isaias Cowman, MD;  Location: [facility] ORS;  Service: Cardiovascular;  Laterality: Right;   PARTIAL MASTECTOMY WITH AXILLARY SENTINEL LYMPH NODE BIOPSY Left 12/08/2015   Procedure: PARTIAL MASTECTOMY WITH AXILLARY SENTINEL LYMPH NODE BIOPSY;  Surgeon: [name], MD;  Location: [facility] ORS;  Service: General;  Laterality: Left;    Home Medications:  Allergies as of 12/12/2020   No Known Allergies      Medication List        Accurate as of December 12, 2020 11:59 PM. If you have any questions, ask your nurse or doctor.  acetaminophen 325 MG tablet Commonly known as: TYLENOL Take 650 mg by mouth every 6 (six) hours as needed for mild pain or moderate pain.   bisacodyl 10 MG suppository Commonly known as: DULCOLAX Place 10 mg rectally daily as needed for moderate constipation.   calcium carbonate 1500 (600 Ca) MG Tabs tablet Commonly known as: OSCAL Take 2 tablets by mouth daily.   carbidopa-levodopa 50-200 MG tablet Commonly known as: SINEMET CR Take 3 tablets by mouth 3 (three) times daily.   Clear Eyes Complete Soln Place 2 drops into both eyes every 2 (two) hours as needed.   conjugated estrogens 0.625 MG/GM vaginal cream Commonly known as: PREMARIN Apply 0.5 mg (  pea-sized amount) just inside the vaginal introitus with finger-tip weekly on Monday nights   diltiazem 120 MG tablet Commonly known as: CARDIZEM Take 120 mg by mouth daily.   furosemide 40 MG tablet Commonly known as: LASIX Take 40 mg by mouth daily.   Hibiclens 4 % external liquid Generic drug: chlorhexidine Apply topically daily as needed.   latanoprost 0.005 % ophthalmic solution Commonly known as: XALATAN Place 1 drop into both eyes at bedtime.   Lidocaine HCl 4 % Crea Apply liberal amount topically  to shoulders/ neck or any other area of pain TID prn for pain/OA   melatonin 5 MG Tabs Take 5 mg by mouth at bedtime.   methenamine 1 g tablet Commonly known as: Hiprex Take 1 tablet (1 g total) by mouth 2 (two) times daily with a meal. Started by: Zara Council, PA-C   mirabegron ER 25 MG Tb24 tablet Commonly known as: MYRBETRIQ Take 25 mg by mouth daily.   nitrofurantoin (macrocrystal-monohydrate) 100 MG capsule Commonly known as: MACROBID Take 1 capsule (100 mg total) by mouth every 12 (twelve) hours. What changed: Another medication with the same name was added. Make sure you understand how and when to take each. Changed by: Zara Council, PA-C   nitrofurantoin (macrocrystal-monohydrate) 100 MG capsule Commonly known as: MACROBID Take 1 capsule (100 mg total) by mouth every 12 (twelve) hours. What changed: You were already taking a medication with the same name, and this prescription was added. Make sure you understand how and when to take each. Changed by: Zara Council, PA-C   NON FORMULARY Diet Type: Regular diet. Small portions   ondansetron 4 MG tablet Commonly known as: ZOFRAN Take 4 mg by mouth every 6 (six) hours as needed for nausea or vomiting.   oxybutynin 5 MG tablet Commonly known as: DITROPAN Take 5 mg by mouth 2 (two) times daily.   potassium chloride SA 20 MEQ tablet Commonly known as: KLOR-CON M Take 20 mEq by mouth daily.    rivaroxaban 20 MG Tabs tablet Commonly known as: XARELTO Take 20 mg by mouth daily with supper.   rOPINIRole 0.5 MG tablet Commonly known as: REQUIP Take 1 tablet (0.5 mg total) by mouth at bedtime.   senna-docusate 8.6-50 MG tablet Commonly known as: Senokot-S Take 2 tablets by mouth 2 (two) times daily.   VITAMIN B-12 IJ Inject 1 mL into the muscle every 30 (thirty) days. Once a day on the 1st of the month   vitamin B-12 500 MCG tablet Commonly known as: CYANOCOBALAMIN Take 500 mcg by mouth daily.        Allergies: No Known Allergies  Family History: Family History  Problem Relation Age of Onset   Esophageal cancer Sister    Kidney disease Neg Hx    Bladder Cancer  Neg Hx    Breast cancer Neg Hx     Social History:  reports that she has never smoked. She has never used smokeless tobacco. She reports that she does not drink alcohol and does not use drugs.  ROS: Pertinent ROS in HPI  Physical Exam: BP 115/77   Pulse 83   Ht 5' (1.524 m)   Wt 113 lb (51.3 kg)   BMI 22.07 kg/m   Constitutional:  Well nourished. Alert and oriented, No acute distress. HEENT: [place] AT, mask in place.  Trachea midline Cardiovascular: No clubbing, cyanosis, or edema. Respiratory: Normal respiratory effort, no increased work of breathing. Neurologic: Grossly intact, no focal deficits, moving all 4 extremities.  In wheelchair.   Psychiatric: Normal mood and affect.    Laboratory Data: Component     Latest Ref Rng & Units 12/12/2020  Specific Gravity, UA     1.005 - 1.030 1.015  pH, UA     5.0 - 7.5 8.5 (H)  Color, UA     Yellow Yellow  Appearance Ur     Clear Turbid (A)  Leukocytes,UA     Negative 3+ (A)  Protein,UA     Negative/Trace 2+ (A)  Glucose, UA     NEGATIVE mg/dL Negative  Ketones, UA     Negative Trace (A)  RBC, UA     Negative 1+ (A)  Bilirubin, UA     Negative Negative  Urobilinogen, Ur     0.2 - 1.0 mg/dL 0.2  Nitrite, UA     Negative Positive (A)   Microscopic Examination      See below:   Component     Latest Ref Rng & Units 12/12/2020  WBC, UA     0 - 5 WBC/hpf >30 (A)  RBC     3.87 - 5.11 MIL/uL 0-2  Epithelial Cells (non renal)     0 - 10 /hpf 0-10  Crystals     N/A Present (A)  Crystal Type     N/A Triple Phosphate  Bacteria, UA     NONE SEEN Many (A)  I have reviewed the labs.   Pertinent Imaging: N/A  Assessment & Plan:    1. rUTI's -UA grossly infected -urine sent for culture -start Hiprex 1 gram twice daily  2. Vaginal atrophy -Continue vaginal estrogen cream twice weekly  3. Incontinence -Managed conservatively                                             Return for pending urine culture results .  These notes generated with voice recognition software. I apologize for typographical errors.  Zara Council, PA-C  [facility] [address]  [place], [place] [address] [phone]

## 2020-12-12 ENCOUNTER — Ambulatory Visit (INDEPENDENT_AMBULATORY_CARE_PROVIDER_SITE_OTHER): Payer: Medicare Other | Admitting: Urology

## 2020-12-12 ENCOUNTER — Ambulatory Visit: Payer: Medicare Other

## 2020-12-12 ENCOUNTER — Encounter: Payer: Self-pay | Admitting: Urology

## 2020-12-12 ENCOUNTER — Other Ambulatory Visit: Payer: Medicare Other

## 2020-12-12 VITALS — BP 115/77 | HR 83 | Ht 60.0 in | Wt 113.0 lb

## 2020-12-12 DIAGNOSIS — N3946 Mixed incontinence: Secondary | ICD-10-CM

## 2020-12-12 DIAGNOSIS — N952 Postmenopausal atrophic vaginitis: Secondary | ICD-10-CM

## 2020-12-12 DIAGNOSIS — N390 Urinary tract infection, site not specified: Secondary | ICD-10-CM | POA: Diagnosis not present

## 2020-12-12 LAB — URINALYSIS, COMPLETE
Bilirubin, UA: NEGATIVE
Glucose, UA: NEGATIVE
Nitrite, UA: POSITIVE — AB
Specific Gravity, UA: 1.015 (ref 1.005–1.030)
Urobilinogen, Ur: 0.2 mg/dL (ref 0.2–1.0)
pH, UA: 8.5 — ABNORMAL HIGH (ref 5.0–7.5)

## 2020-12-12 LAB — MICROSCOPIC EXAMINATION: WBC, UA: >30 /hpf — AB (ref 0–5)

## 2020-12-12 MED ORDER — METHENAMINE HIPPURATE 1 G PO TABS: 1.0000 g | ORAL_TABLET | Freq: Two times a day (BID) | ORAL | 3 refills | Status: AC

## 2020-12-12 MED ORDER — NITROFURANTOIN MONOHYD MACRO 100 MG PO CAPS: 100.0000 mg | ORAL_CAPSULE | Freq: Two times a day (BID) | ORAL | 0 refills | Status: DC

## 2020-12-14 ENCOUNTER — Telehealth: Payer: Self-pay

## 2020-12-14 ENCOUNTER — Telehealth: Payer: Self-pay | Admitting: Oncology

## 2020-12-14 NOTE — Telephone Encounter (Signed)
Anderson Malta could you please contact patient's son and notify him of patients next appt. Thanks

## 2020-12-14 NOTE — Telephone Encounter (Signed)
Sure, spoke with Laura Cisneros and appointments have been rescheduled.

## 2020-12-14 NOTE — Telephone Encounter (Signed)
Son called stating that he didn't get a follow up appt when they were here. Please give him a call back at [phone]

## 2020-12-15 ENCOUNTER — Other Ambulatory Visit: Payer: Self-pay

## 2020-12-15 ENCOUNTER — Other Ambulatory Visit: Payer: Medicare Other | Admitting: Student

## 2020-12-15 DIAGNOSIS — K5900 Constipation, unspecified: Secondary | ICD-10-CM

## 2020-12-15 DIAGNOSIS — N390 Urinary tract infection, site not specified: Secondary | ICD-10-CM

## 2020-12-15 DIAGNOSIS — R630 Anorexia: Secondary | ICD-10-CM

## 2020-12-15 DIAGNOSIS — Z515 Encounter for palliative care: Secondary | ICD-10-CM

## 2020-12-15 DIAGNOSIS — G20 Parkinson's disease: Secondary | ICD-10-CM

## 2020-12-15 LAB — CULTURE, URINE COMPREHENSIVE

## 2020-12-15 NOTE — Progress Notes (Signed)
Therapist, nutritional Palliative Care Consult Note Telephone: [phone]  Fax: [phone]    Date of encounter: 12/15/20 1:09 PM PATIENT NAME: Laura Cisneros [address] [place] [address]   [phone] (home)  DOB: 1925/07/15 MRN: [id number] PRIMARY CARE PROVIDER:    Lauro Regulus, MD,  [address] [facility] [place] [address] [phone]  REFERRING PROVIDER:   Marletta Lor, NP  RESPONSIBLE PARTY:    Contact Information     Name Relation Home Work Mobile   Laura Cisneros, Laura Cisneros [phone]     Laura Cisneros, Laura Cisneros [phone]   Laura Cisneros, Laura Cisneros [phone]          I met face to face with patient and family in the home. Palliative Care was asked to follow this patient by consultation request of  Marletta Lor, NP to address advance care planning and complex medical decision making. This is a follow up visit.                                   ASSESSMENT AND PLAN / RECOMMENDATIONS:   Advance Care Planning/Goals of Care: Goals include to maximize quality of life and symptom management. Our advance care planning conversation included a discussion about:    The value and importance of advance care planning  Experiences with loved ones who have been seriously ill or have died  Exploration of personal, cultural or spiritual beliefs that might influence medical decisions  Exploration of goals of care in the event of a sudden injury or illness  CODE STATUS: DNR   Education provided on palliative medicine versus hospice services.  We discussed should patient continue to decline we will refer back to hospice services for evaluation.  Son expresses dissatisfaction with the difference in level of resources in palliative versus hospice services.  Patient is in need of an in-home provider due to having more difficulty getting out of the home.  Recommendations for in-home providers given to son Laura Cisneros today.  We also discussed if patient would go to hospital as needed; son states depending on the situation. Palliative Medicine will continue to provide support.   Symptom Management/Plan:  Parkinson's disease-continue carbidopa-levodopa as directed. Family to continue providing supportive care. Monitor for functional/cognitive changes and declines. Discussed hallucinations being related to Parkinson's vs. Acute UTI. Follow up with Neurology as scheduled  Constipation-continue senna 2 tabs BID, lactulose PRN.   Recurrent UTI's- continue premarin cream as directed. Currently being treated with nitrofurantoin 100 mg every 12 hours x 7 days for a UTI. Will start methenamine 1 gm BID.  Decline in appetite-patient's appetite is improving per son. Continue to offer foods she enjoys, nutritional supplement BID,  monitor for further weight loss.   Follow up Palliative Care Visit: Palliative care will continue to follow for complex medical decision making, advance care planning, and clarification of goals. Return in 4 weeks or prn.  I spent 70 minutes providing this consultation. More than 50% of the time in this consultation was spent in counseling and care coordination.   PPS: 40%  HOSPICE ELIGIBILITY/DIAGNOSIS: TBD  Chief Complaint: Palliative Medicine   HISTORY OF PRESENT ILLNESS:  Laura Cisneros is a 87 y.o. year old female  with  parkinson's disease, chronic diastolic HF, hypersensitive heart disease, atrial fibrillation, dysphagia, hx of breast cancer, DVT, PE-on, hx of frequent UTI's.   Patient currently being treated  for a UTI.  11/15 started on nitrofurantoin 100 mg every 12 hours x 7 days for a UTI. Will start methenamine 1 gm BID. Son Laura Cisneros states that patient had a rough 2 weeks. She had a decline in appetite and required IV fluids x2 via cancer center.  He states her appetite is now back at baseline and she is eating food she enjoys and drinking nutritional supplements. She is sleeping later in the  morning. Denies pain, shortness of breath; endorses constipation. Having more difficulty ambulating, getting out of bed. Now just using the bedside commode. Hallucinations have been better the past couple weeks. Family reports an approximate 9 pound loss over the past 3 months. Laura Cisneros states that he does not believe the 113 pounds is accurate on previous records as the hospice team had been measuring her arm to monitor for weight loss. Patient recently seen by Oncology to follow up on her stage I breast CA and osteoporosis. A 10-point review of systems is negative, except for the pertinent positives and negatives detailed in the HPI.    History obtained from review of EMR, discussion with primary team, and interview with family, facility staff/caregiver and/or Laura Cisneros.  I reviewed available labs, medications, imaging, studies and related documents from the EMR.  Records reviewed and summarized above.    Physical Exam: Current and past weights: 99.6 today Pulse 76, resp 16, b/p 110/70, sats 97% on room air Constitutional: NAD General: frail appearing, thin EYES: anicteric sclera, lids intact, no discharge  ENMT: intact hearing, oral mucous membranes moist, dentition intact CV: S1S2, RRR, no LE edema Pulmonary: LCTA, no increased work of breathing, no cough, room air Abdomen: normo-active BS + 4 quadrants, soft and non tender, no ascites GU: deferred MSK: sarcopenia, moves all extremities, stand pivot for transfers Skin: warm and dry, no rashes or wounds on visible skin Neuro: generalized weakness, A & O x 3 Psych: non-anxious affect, pleasant Hem/lymph/immuno: no widespread bruising   Thank you for the opportunity to participate in the care of Laura Cisneros.  The palliative care team will continue to follow. Please call our office at [phone] if we can be of additional assistance.   Ezekiel Slocumb, NP   COVID-19 PATIENT SCREENING TOOL Asked and negative response unless otherwise noted:    Have you had symptoms of covid, tested positive or been in contact with someone with symptoms/positive test in the past 5-10 days? No

## 2020-12-19 ENCOUNTER — Encounter: Payer: Self-pay | Admitting: Hematology and Oncology

## 2020-12-19 ENCOUNTER — Telehealth: Payer: Self-pay

## 2020-12-19 MED ORDER — SULFAMETHOXAZOLE-TRIMETHOPRIM 800-160 MG PO TABS: 1.0000 | ORAL_TABLET | Freq: Two times a day (BID) | ORAL | 0 refills | Status: DC

## 2020-12-19 NOTE — Telephone Encounter (Signed)
-----   Message from Nori Riis, PA-C sent at 12/18/2020  8:03 AM EST ----- Please let Mrs. Lie' sons know that her urine culture was positive and that we need for her to stop the nitrofurantoin (Macrobid) and start Septra DS, twice daily for seven days.

## 2020-12-19 NOTE — Telephone Encounter (Signed)
Called and notified patient's son of results, new medication sent into pharmacy. He verbalized understanding that she will stop nitrofuratoin and start Seprta DS today.

## 2020-12-27 ENCOUNTER — Emergency Department: Payer: Medicare Other

## 2020-12-27 ENCOUNTER — Other Ambulatory Visit: Payer: Self-pay

## 2020-12-27 ENCOUNTER — Inpatient Hospital Stay
Admission: EM | Admit: 2020-12-27 | Discharge: 2020-12-31 | DRG: 640 | Disposition: A | Discharge status: Hospice | Payer: Medicare Other | Attending: Internal Medicine | Admitting: Internal Medicine

## 2020-12-27 ENCOUNTER — Encounter: Payer: Self-pay | Admitting: Hematology and Oncology

## 2020-12-27 ENCOUNTER — Encounter: Payer: Self-pay | Admitting: Emergency Medicine

## 2020-12-27 DIAGNOSIS — E871 Hypo-osmolality and hyponatremia: Secondary | ICD-10-CM | POA: Diagnosis present

## 2020-12-27 DIAGNOSIS — Z66 Do not resuscitate: Secondary | ICD-10-CM | POA: Diagnosis present

## 2020-12-27 DIAGNOSIS — Z79899 Other long term (current) drug therapy: Secondary | ICD-10-CM

## 2020-12-27 DIAGNOSIS — Z7901 Long term (current) use of anticoagulants: Secondary | ICD-10-CM

## 2020-12-27 DIAGNOSIS — Z86711 Personal history of pulmonary embolism: Secondary | ICD-10-CM

## 2020-12-27 DIAGNOSIS — E860 Dehydration: Secondary | ICD-10-CM | POA: Diagnosis present

## 2020-12-27 DIAGNOSIS — R531 Weakness: Secondary | ICD-10-CM

## 2020-12-27 DIAGNOSIS — Z515 Encounter for palliative care: Secondary | ICD-10-CM

## 2020-12-27 DIAGNOSIS — Z853 Personal history of malignant neoplasm of breast: Secondary | ICD-10-CM

## 2020-12-27 DIAGNOSIS — G20 Parkinson's disease: Secondary | ICD-10-CM | POA: Diagnosis present

## 2020-12-27 DIAGNOSIS — I110 Hypertensive heart disease with heart failure: Secondary | ICD-10-CM | POA: Diagnosis present

## 2020-12-27 DIAGNOSIS — E43 Unspecified severe protein-calorie malnutrition: Secondary | ICD-10-CM | POA: Insufficient documentation

## 2020-12-27 DIAGNOSIS — I495 Sick sinus syndrome: Secondary | ICD-10-CM | POA: Diagnosis present

## 2020-12-27 DIAGNOSIS — Z8673 Personal history of transient ischemic attack (TIA), and cerebral infarction without residual deficits: Secondary | ICD-10-CM

## 2020-12-27 DIAGNOSIS — M810 Age-related osteoporosis without current pathological fracture: Secondary | ICD-10-CM | POA: Diagnosis present

## 2020-12-27 DIAGNOSIS — Z950 Presence of cardiac pacemaker: Secondary | ICD-10-CM

## 2020-12-27 DIAGNOSIS — Z800 Family history of malignant neoplasm of digestive organs: Secondary | ICD-10-CM

## 2020-12-27 DIAGNOSIS — R627 Adult failure to thrive: Secondary | ICD-10-CM | POA: Diagnosis present

## 2020-12-27 DIAGNOSIS — I480 Paroxysmal atrial fibrillation: Secondary | ICD-10-CM | POA: Diagnosis present

## 2020-12-27 DIAGNOSIS — Z8616 Personal history of COVID-19: Secondary | ICD-10-CM

## 2020-12-27 DIAGNOSIS — M1990 Unspecified osteoarthritis, unspecified site: Secondary | ICD-10-CM | POA: Diagnosis present

## 2020-12-27 DIAGNOSIS — G2581 Restless legs syndrome: Secondary | ICD-10-CM | POA: Diagnosis present

## 2020-12-27 DIAGNOSIS — H409 Unspecified glaucoma: Secondary | ICD-10-CM | POA: Diagnosis present

## 2020-12-27 DIAGNOSIS — I2699 Other pulmonary embolism without acute cor pulmonale: Secondary | ICD-10-CM | POA: Diagnosis present

## 2020-12-27 DIAGNOSIS — N179 Acute kidney failure, unspecified: Secondary | ICD-10-CM | POA: Diagnosis present

## 2020-12-27 DIAGNOSIS — R1312 Dysphagia, oropharyngeal phase: Secondary | ICD-10-CM | POA: Diagnosis present

## 2020-12-27 DIAGNOSIS — N390 Urinary tract infection, site not specified: Secondary | ICD-10-CM | POA: Diagnosis present

## 2020-12-27 DIAGNOSIS — G20A1 Parkinson's disease without dyskinesia, without mention of fluctuations: Secondary | ICD-10-CM | POA: Diagnosis present

## 2020-12-27 DIAGNOSIS — Z87440 Personal history of urinary (tract) infections: Secondary | ICD-10-CM

## 2020-12-27 DIAGNOSIS — Z681 Body mass index (BMI) 19 or less, adult: Secondary | ICD-10-CM

## 2020-12-27 DIAGNOSIS — I509 Heart failure, unspecified: Secondary | ICD-10-CM | POA: Diagnosis present

## 2020-12-27 DIAGNOSIS — E875 Hyperkalemia: Secondary | ICD-10-CM | POA: Diagnosis present

## 2020-12-27 DIAGNOSIS — N3281 Overactive bladder: Secondary | ICD-10-CM | POA: Diagnosis present

## 2020-12-27 LAB — COMPREHENSIVE METABOLIC PANEL
ALT: <5 U/L (ref 0–44)
AST: 12 U/L — ABNORMAL LOW (ref 15–41)
Albumin: 3.6 g/dL (ref 3.5–5.0)
Alkaline Phosphatase: 53 U/L (ref 38–126)
Anion gap: 8 (ref 5–15)
BUN: 40 mg/dL — ABNORMAL HIGH (ref 8–23)
CO2: 28 mmol/L (ref 22–32)
Calcium: 13.6 mg/dL (ref 8.9–10.3)
Chloride: 93 mmol/L — ABNORMAL LOW (ref 98–111)
Creatinine, Ser: 2.02 mg/dL — ABNORMAL HIGH (ref 0.44–1.00)
GFR, Estimated: 23 mL/min — ABNORMAL LOW (ref >60)
Glucose, Bld: 45 mg/dL — ABNORMAL LOW (ref 70–99)
Potassium: 5.3 mmol/L — ABNORMAL HIGH (ref 3.5–5.1)
Sodium: 129 mmol/L — ABNORMAL LOW (ref 135–145)
Total Bilirubin: 1.5 mg/dL — ABNORMAL HIGH (ref 0.3–1.2)
Total Protein: 7 g/dL (ref 6.5–8.1)

## 2020-12-27 LAB — BASIC METABOLIC PANEL
Anion gap: 10 (ref 5–15)
BUN: 42 mg/dL — ABNORMAL HIGH (ref 8–23)
CO2: 27 mmol/L (ref 22–32)
Calcium: 14.6 mg/dL (ref 8.9–10.3)
Chloride: 92 mmol/L — ABNORMAL LOW (ref 98–111)
Creatinine, Ser: 2.07 mg/dL — ABNORMAL HIGH (ref 0.44–1.00)
GFR, Estimated: 22 mL/min — ABNORMAL LOW (ref >60)
Glucose, Bld: 40 mg/dL — CL (ref 70–99)
Potassium: 4.6 mmol/L (ref 3.5–5.1)
Sodium: 129 mmol/L — ABNORMAL LOW (ref 135–145)

## 2020-12-27 LAB — URINALYSIS, COMPLETE (UACMP) WITH MICROSCOPIC
Bilirubin Urine: NEGATIVE
Glucose, UA: NEGATIVE mg/dL
Hgb urine dipstick: NEGATIVE
Nitrite: POSITIVE — AB
Protein, ur: NEGATIVE mg/dL
Specific Gravity, Urine: 1.015 (ref 1.005–1.030)
pH: 7 (ref 5.0–8.0)

## 2020-12-27 LAB — RESP PANEL BY RT-PCR (FLU A&B, COVID) ARPGX2
Influenza A by PCR: NEGATIVE
Influenza B by PCR: NEGATIVE
SARS Coronavirus 2 by RT PCR: NEGATIVE

## 2020-12-27 LAB — CBG MONITORING, ED
Glucose-Capillary: 31 mg/dL — CL (ref 70–99)
Glucose-Capillary: 110 mg/dL — ABNORMAL HIGH (ref 70–99)
Glucose-Capillary: 98 mg/dL (ref 70–99)

## 2020-12-27 LAB — CBC
HCT: 39 % (ref 36.0–46.0)
Hemoglobin: 13 g/dL (ref 12.0–15.0)
MCH: 33.3 pg (ref 26.0–34.0)
MCHC: 33.3 g/dL (ref 30.0–36.0)
MCV: 100 fL (ref 80.0–100.0)
Platelets: 270 10*3/uL (ref 150–400)
RBC: 3.9 MIL/uL (ref 3.87–5.11)
RDW: 14.1 % (ref 11.5–15.5)
WBC: 7.5 10*3/uL (ref 4.0–10.5)
nRBC: 0 % (ref 0.0–0.2)

## 2020-12-27 MED ORDER — MIRABEGRON ER 25 MG PO TB24
25.0000 mg | ORAL_TABLET | Freq: Every day | ORAL | Status: DC
  Administered 2020-12-28 – 2020-12-31 (×4): 25 mg via ORAL
  Filled 2020-12-27 (×4): qty 1

## 2020-12-27 MED ORDER — ROPINIROLE HCL 1 MG PO TABS
0.5000 mg | ORAL_TABLET | Freq: Every day | ORAL | Status: DC
  Administered 2020-12-27 – 2020-12-30 (×4): 0.5 mg via ORAL
  Filled 2020-12-27: qty 2
  Filled 2020-12-27 (×4): qty 1

## 2020-12-27 MED ORDER — SENNOSIDES-DOCUSATE SODIUM 8.6-50 MG PO TABS
2.0000 | ORAL_TABLET | Freq: Every day | ORAL | Status: DC
  Administered 2020-12-28 – 2020-12-31 (×4): 2 via ORAL
  Filled 2020-12-27 (×4): qty 2

## 2020-12-27 MED ORDER — HEPARIN SODIUM (PORCINE) 5000 UNIT/ML IJ SOLN
5000.0000 [IU] | Freq: Three times a day (TID) | INTRAMUSCULAR | Status: DC
  Administered 2020-12-27 – 2020-12-30 (×8): 5000 [IU] via SUBCUTANEOUS
  Filled 2020-12-27 (×9): qty 1

## 2020-12-27 MED ORDER — ONDANSETRON HCL 4 MG/2ML IJ SOLN: 4.0000 mg | Freq: Four times a day (QID) | INTRAMUSCULAR | Status: DC | PRN

## 2020-12-27 MED ORDER — ACETAMINOPHEN 650 MG RE SUPP
650.0000 mg | Freq: Four times a day (QID) | RECTAL | Status: DC | PRN
  Filled 2020-12-27: qty 1

## 2020-12-27 MED ORDER — CALCITONIN (SALMON) 200 UNIT/ML IJ SOLN
4.0000 [IU]/kg | Freq: Two times a day (BID) | INTRAMUSCULAR | Status: DC
  Administered 2020-12-27 – 2020-12-28 (×2): 180 [IU] via SUBCUTANEOUS
  Filled 2020-12-27 (×4): qty 0.9

## 2020-12-27 MED ORDER — ONDANSETRON HCL 4 MG PO TABS: 4.0000 mg | ORAL_TABLET | Freq: Four times a day (QID) | ORAL | Status: DC | PRN

## 2020-12-27 MED ORDER — CARBIDOPA-LEVODOPA ER 50-200 MG PO TBCR
3.0000 | EXTENDED_RELEASE_TABLET | Freq: Three times a day (TID) | ORAL | Status: DC
  Administered 2020-12-27 – 2020-12-31 (×11): 3 via ORAL
  Filled 2020-12-27 (×15): qty 3

## 2020-12-27 MED ORDER — DOCUSATE SODIUM 100 MG PO CAPS
100.0000 mg | ORAL_CAPSULE | Freq: Two times a day (BID) | ORAL | Status: DC
  Administered 2020-12-27 – 2020-12-31 (×7): 100 mg via ORAL
  Filled 2020-12-27 (×7): qty 1

## 2020-12-27 MED ORDER — SODIUM CHLORIDE 0.9 % IV SOLN: INTRAVENOUS | Status: AC

## 2020-12-27 MED ORDER — SODIUM CHLORIDE 0.9 % IV SOLN: Freq: Once | INTRAVENOUS | Status: DC

## 2020-12-27 MED ORDER — DEXTROSE 50 % IV SOLN
INTRAVENOUS | Status: AC
  Administered 2020-12-27: 25 mL
  Filled 2020-12-27: qty 50

## 2020-12-27 MED ORDER — SODIUM CHLORIDE 0.9 % IV SOLN: INTRAVENOUS | Status: DC

## 2020-12-27 MED ORDER — SODIUM CHLORIDE 0.9 % IV BOLUS
500.0000 mL | Freq: Once | INTRAVENOUS | Status: AC
  Administered 2020-12-27: 500 mL via INTRAVENOUS

## 2020-12-27 MED ORDER — OXYBUTYNIN CHLORIDE 5 MG PO TABS
5.0000 mg | ORAL_TABLET | Freq: Two times a day (BID) | ORAL | Status: DC
  Administered 2020-12-27 – 2020-12-31 (×8): 5 mg via ORAL
  Filled 2020-12-27 (×10): qty 1

## 2020-12-27 MED ORDER — DILTIAZEM HCL 30 MG PO TABS
120.0000 mg | ORAL_TABLET | Freq: Every day | ORAL | Status: DC
  Administered 2020-12-28 – 2020-12-31 (×4): 120 mg via ORAL
  Filled 2020-12-27 (×4): qty 4

## 2020-12-27 MED ORDER — ACETAMINOPHEN 325 MG PO TABS: 650.0000 mg | ORAL_TABLET | Freq: Four times a day (QID) | ORAL | Status: DC | PRN

## 2020-12-27 NOTE — ED Provider Notes (Signed)
[facility] Emergency Department Provider Note    Event Date/Time   First MD Initiated Contact with Patient 12/27/20 1056     (approximate)  I have reviewed the triage vital signs and the nursing notes.   HISTORY  Chief Complaint Weakness    HPI Laura Cisneros is a 87 y.o. female with extensive past medical history currently on palliative care presents to the ER for evaluation of generalized weakness poor p.o. intake and concern for recurrent UTI despite multiple rounds of oral antibiotics.  Says she been dealing with this for several weeks.  Has had foul odor as well as some confusion denies any dysuria.  Was recently switched to Bactrim.  Family has been trying to encourage Pedialyte and will take her to the cancer center to receive IV fluid.  Given the ER today as the palliative care provider was not reachable and they wanted her evaluated.  Past Medical History:  Diagnosis Date   A-fib ([place])    unspecified   Anemia    unspecified   Arthritis    osteoarthritis   Bleeding hemorrhoid    Breast cancer (HCC)    unspecified   Cancer ([place])    Breast   Cataract    Diverticulosis    DVT (deep vein thrombosis) in pregnancy    Hiatal hernia    Hypertension    Incontinence    Osteoarthritis    Osteoporosis    a. Intolerance to Fosamax b. Bilateral foot fractures c. IV Boniva d. Low vitamin D e. Reclast   Parkinson disease ([place])    PE (pulmonary thromboembolism) ([place])    Post-menopausal atrophic vaginitis 01/28/2017   Scleritis    Idiopathic a. Prednisone b. s/p methotrexate   TIA (transient ischemic attack) 1997   Possible   Urinary urgency    Vitamin B12 deficiency    Family History  Problem Relation Age of Onset   Esophageal cancer Sister    Kidney disease Neg Hx    Bladder Cancer Neg Hx    Breast cancer Neg Hx    Past Surgical History:  Procedure Laterality Date   ABDOMINAL HYSTERECTOMY     ABDOMINAL HYSTERECTOMY     partial   BLADDER  TACK SURGERY     Dr. Ouida Sills   BREAST BIOPSY Left 10/2012   benign   BREAST BIOPSY Left 11/22/2015   invasive mammary ca    BREAST EXCISIONAL BIOPSY Left 12/08/2015   lumpectomy   BREAST LUMPECTOMY Left 11/2015   Invasive mammary carcinoma Grade I   EYE SURGERY Bilateral    Catarct Extraction with IOL   FINGER SURGERY Right    FOOT FRACTURE SURGERY Right 06/2003   pinning, Dr. Francia Greaves, [facility]   FOOT SURGERY Left    INCONTINENCE SURGERY     NEUROMA SURGERY     PACEMAKER INSERTION Right 10/02/2016   Procedure: INSERTION PACEMAKER;  Surgeon: Isaias Cowman, MD;  Location: [facility] ORS;  Service: Cardiovascular;  Laterality: Right;   PARTIAL MASTECTOMY WITH AXILLARY SENTINEL LYMPH NODE BIOPSY Left 12/08/2015   Procedure: PARTIAL MASTECTOMY WITH AXILLARY SENTINEL LYMPH NODE BIOPSY;  Surgeon: [name], MD;  Location: [facility] ORS;  Service: General;  Laterality: Left;   Patient Active Problem List   Diagnosis Date Noted   Macrocytic anemia 09/29/2019   Frequent UTI 03/14/2018   Chronic bilateral low back pain 02/14/2018   Chronic constipation 12/05/2017   Glaucoma (increased eye pressure) 08/04/2017   Osteoarthritis 04/30/2017   Sick sinus syndrome (  [place]) 01/28/2017   Hypertensive heart disease with heart failure ([place]) 01/28/2017   Chronic diastolic (congestive) heart failure ([place]) 01/28/2017   Restless leg syndrome 01/28/2017   Deficiency of other specified B group vitamins 01/28/2017   Post-menopausal atrophic vaginitis 01/28/2017   Personal history of malignant neoplasm of breast 01/28/2017   Overactive bladder 01/28/2017   Dysphagia, oropharyngeal phase 01/28/2017   Dry eyes 01/28/2017   Insomnia 01/28/2017   Weakness generalized 10/25/2016   Deep vein thrombosis (DVT) of distal vein of right lower extremity ([place]) 09/18/2016   Parkinson disease ([place]) 06/18/2016   AF (paroxysmal atrial fibrillation) ([place]) 06/18/2016   PE (pulmonary thromboembolism) ([place]) 06/18/2016    Osteoporosis without current pathological fracture 12/04/2015      Prior to Admission medications   Medication Sig Start Date End Date Taking? Authorizing Provider  acetaminophen (TYLENOL) 325 MG tablet Take 650 mg by mouth every 6 (six) hours as needed.  03/01/18   [provider]  bisacodyl (DULCOLAX) 10 MG suppository Place 10 mg rectally daily as needed for moderate constipation.    [provider]  calcium carbonate (OSCAL) 1500 (600 Ca) MG TABS tablet Take 2 tablets by mouth daily.     [provider]  carbidopa-levodopa (SINEMET CR) 50-200 MG tablet Take 3 tablets by mouth 3 (three) times daily.    [provider]  chlorhexidine (HIBICLENS) 4 % external liquid Apply topically daily as needed. 10/31/20   Zara Council A, PA-C  conjugated estrogens (PREMARIN) vaginal cream Apply 0.5 mg ( pea-sized amount) just inside the vaginal introitus with finger-tip weekly on Monday nights    [provider]  Cyanocobalamin (VITAMIN B-12 IJ) Inject 1 mL into the muscle every 30 (thirty) days. Once a day on the 1st of the month    [provider]  diltiazem (CARDIZEM) 120 MG tablet Take 120 mg by mouth daily.    [provider]  furosemide (LASIX) 40 MG tablet Take 40 mg by mouth daily.     [provider]  Hyprom-Naphaz-Polysorb-Zn Sulf (CLEAR EYES COMPLETE) SOLN Place 2 drops into both eyes every 2 (two) hours as needed. 12/21/17   [provider]  latanoprost (XALATAN) 0.005 % ophthalmic solution Place 1 drop into both eyes at bedtime. Per Dr. Wallace Going    [provider]  Lidocaine HCl 4 % CREA Apply liberal amount topically  to shoulders/ neck or any other area of pain TID prn for pain/OA    [provider]  melatonin 5 MG TABS 5 mg. 11/18/17   [provider]  methenamine (HIPREX) 1 g tablet Take 1 tablet (1 g total) by mouth 2 (two) times daily with a meal. 12/12/20   McGowan, Larene [place] A,  PA-C  mirabegron ER (MYRBETRIQ) 25 MG TB24 tablet Take 25 mg by mouth daily.    [provider]  NON FORMULARY Diet Type: Regular diet. Small portions    [provider]  ondansetron (ZOFRAN) 4 MG tablet Take 4 mg by mouth every 6 (six) hours as needed for nausea or vomiting.    [provider]  oxybutynin (DITROPAN) 5 MG tablet Take 5 mg by mouth 2 (two) times daily.    [provider]  potassium chloride SA (K-DUR,KLOR-CON) 20 MEQ tablet Take 20 mEq by mouth daily.     [provider]  rivaroxaban (XARELTO) 20 MG TABS tablet Take 20 mg by mouth daily with supper.     [provider]  rOPINIRole (REQUIP) 0.5  MG tablet Take 1 tablet (0.5 mg total) by mouth at bedtime. 10/04/16   [name], MD  senna-docusate (SENOKOT-S) 8.6-50 MG tablet Take 2 tablets by mouth daily.    [provider]  sulfamethoxazole-trimethoprim (BACTRIM DS) 800-160 MG tablet Take 1 tablet by mouth every 12 (twelve) hours. 12/19/20   Zara Council A, PA-C  vitamin B-12 (CYANOCOBALAMIN) 500 MCG tablet Take 500 mcg by mouth daily.    [provider]    Allergies Patient has no known allergies.    Social History Social History   Tobacco Use   Smoking status: Never   Smokeless tobacco: Never  Vaping Use   Vaping Use: Never used  Substance Use Topics   Alcohol use: No   Drug use: No    Review of Systems Patient denies headaches, rhinorrhea, blurry vision, numbness, shortness of breath, chest pain, edema, cough, abdominal pain, nausea, vomiting, diarrhea, dysuria, fevers, rashes or hallucinations unless otherwise stated above in HPI. ____________________________________________   PHYSICAL EXAM:  VITAL SIGNS: Vitals:   12/27/20 1045 12/27/20 1430  BP: 122/78 137/65  Pulse: 61 60  Resp: 16 16  Temp: (!) 97.3 F (36.3 C)   SpO2: 96% 97%    Constitutional: Alert, frail and chronically ill apearing  Eyes: Conjunctivae are  normal.  Head: Atraumatic. Nose: No congestion/rhinnorhea. Mouth/Throat: Mucous membranes are  dry  Neck: No stridor. Painless ROM.  Cardiovascular: Normal rate, regular rhythm. Grossly normal heart sounds.  Good peripheral circulation. Respiratory: Normal respiratory effort.  No retractions. Lungs CTAB. Gastrointestinal: Soft and nontender. No distention. No abdominal bruits. No CVA tenderness. Genitourinary:  Musculoskeletal: No lower extremity tenderness nor edema.  No joint effusions. Neurologic:  Normal speech and language. No gross focal neurologic deficits are appreciated. No facial droop Skin:  Skin is warm, dry and intact. No rash noted. Psychiatric: Mood and affect are normal. Speech and behavior are normal.  ____________________________________________   LABS (all labs ordered are listed, but only abnormal results are displayed)  Results for orders placed or performed during the hospital encounter of 12/27/20 (from the past 24 hour(s))  CBC     Status: None   Collection Time: 12/27/20 11:01 AM  Result Value Ref Range   WBC 7.5 4.0 - 10.5 K/uL   RBC 3.90 3.87 - 5.11 MIL/uL   Hemoglobin 13.0 12.0 - 15.0 g/dL   HCT 39.0 36.0 - 46.0 %   MCV 100.0 80.0 - 100.0 fL   MCH 33.3 26.0 - 34.0 pg   MCHC 33.3 30.0 - 36.0 g/dL   RDW 14.1 11.5 - 15.5 %   Platelets 270 150 - 400 K/uL   nRBC 0.0 0.0 - 0.2 %  Comprehensive metabolic panel     Status: Abnormal   Collection Time: 12/27/20 11:01 AM  Result Value Ref Range   Sodium 129 (L) 135 - 145 mmol/L   Potassium 5.3 (H) 3.5 - 5.1 mmol/L   Chloride 93 (L) 98 - 111 mmol/L   CO2 28 22 - 32 mmol/L   Glucose, Bld 45 (L) 70 - 99 mg/dL   BUN 40 (H) 8 - 23 mg/dL   Creatinine, Ser 2.02 (H) 0.44 - 1.00 mg/dL   Calcium 13.6 (HH) 8.9 - 10.3 mg/dL   Total Protein 7.0 6.5 - 8.1 g/dL   Albumin 3.6 3.5 - 5.0 g/dL   AST 12 (L) 15 - 41 U/L   ALT <5 0 - 44 U/L   Alkaline Phosphatase 53 38 - 126 U/L  Total Bilirubin 1.5 (H) 0.3 - 1.2 mg/dL    GFR, Estimated 23 (L) >60 mL/min   Anion gap 8 5 - 15   ____________________________________________  ____________________________________________  RADIOLOGY  I personally reviewed all radiographic images ordered to evaluate for the above acute complaints and reviewed radiology reports and findings.  These findings were personally discussed with the patient.  Please see medical record for radiology report.  ____________________________________________   PROCEDURES  Procedure(s) performed:  Procedures    Critical Care performed: no ____________________________________________   INITIAL IMPRESSION / ASSESSMENT AND PLAN / ED COURSE  Pertinent labs & imaging results that were available during my care of the patient were reviewed by me and considered in my medical decision making (see chart for details).   DDX: Dehydration, electrolyte abnormality, medication effect, UTI, AKI  SONAKSHI ROLLAND is a 87 y.o. who presents to the ED with presentation as described above.  Patient very frail weak and dehydrated appearing.  Blood work sent with above differential does show evidence of mild AKI with hyponatremia, borderline hyperkalemia as well as hypercalcemia.  Given her frailty weakness and symptoms given IV fluids and after discussion with patient and family will plan to discuss with hospitalist for admission for observation for IV fluids and reassessment.     The patient was evaluated in Emergency Department today for the symptoms described in the history of present illness. He/she was evaluated in the context of the global COVID-19 pandemic, which necessitated consideration that the patient might be at risk for infection with the SARS-CoV-2 virus that causes COVID-19. Institutional protocols and algorithms that pertain to the evaluation of patients at risk for COVID-19 are in a state of rapid change based on information released by regulatory bodies including the CDC and federal and state  organizations. These policies and algorithms were followed during the patient's care in the ED.  As part of my medical decision making, I reviewed the following data within the [facility] notes reviewed and incorporated, Labs reviewed, notes from prior ED visits and [place] Controlled Substance Database   ____________________________________________   FINAL CLINICAL IMPRESSION(S) / ED DIAGNOSES  Final diagnoses:  Weakness  Dehydration  AKI (acute kidney injury) ([place])  Hypercalcemia      NEW MEDICATIONS STARTED DURING THIS VISIT:  New Prescriptions   No medications on file     Note:  This document was prepared using Dragon voice recognition software and may include unintentional dictation errors.    Merlyn Lot, MD 12/27/20 1520

## 2020-12-27 NOTE — Progress Notes (Signed)
[facility]) Hospital Liaison note:  This patient is currently enrolled in [facility] outpatient-based Palliative Care. Will continue to follow for disposition.  Please call with any outpatient palliative questions or concerns.  Thank you, Lorelee Market, LPN [facility] Liaison [phone]

## 2020-12-27 NOTE — Progress Notes (Signed)
[place] for calcitonin  Indication: hypercalcemia  No Known Allergies  Patient Measurements: Height: 5' (152.4 cm) Weight: 44.9 kg (99 lb) IBW/kg (Calculated) : 45.5  Vital Signs: Temp: 97.3 F (36.3 C) (11/30 1045) Temp Source: Oral (11/30 1045) BP: 131/67 (11/30 1900) Pulse Rate: 59 (11/30 1900) Intake/Output from previous day: No intake/output data recorded. Intake/Output from this shift: No intake/output data recorded.  Labs: Recent Labs    12/27/20 1101 12/27/20 1722  WBC 7.5  --   HGB 13.0  --   HCT 39.0  --   PLT 270  --   CREATININE 2.02* 2.07*  ALBUMIN 3.6  --   PROT 7.0  --   AST 12*  --   ALT <5  --   ALKPHOS 53  --   BILITOT 1.5*  --    Estimated Creatinine Clearance: 12 mL/min (A) (by C-G formula based on SCr of 2.07 mg/dL (H)).  Medical History: Past Medical History:  Diagnosis Date   A-fib ([place])    unspecified   Anemia    unspecified   Arthritis    osteoarthritis   Bleeding hemorrhoid    Breast cancer ([place])    unspecified   Cancer ([place])    Breast   Cataract    Diverticulosis    DVT (deep vein thrombosis) in pregnancy    Hiatal hernia    Hypertension    Incontinence    Osteoarthritis    Osteoporosis    a. Intolerance to Fosamax b. Bilateral foot fractures c. IV Boniva d. Low vitamin D e. Reclast   Parkinson disease ([place])    PE (pulmonary thromboembolism) ([place])    Post-menopausal atrophic vaginitis 01/28/2017   Scleritis    Idiopathic a. Prednisone b. s/p methotrexate   TIA (transient ischemic attack) 1997   Possible   Urinary urgency    Vitamin B12 deficiency     Medications:  Scheduled:   carbidopa-levodopa  3 tablet Oral TID   diltiazem  120 mg Oral Daily   docusate sodium  100 mg Oral BID   heparin  5,000 Units Subcutaneous Q8H   [START ON 12/28/2020] mirabegron ER  25 mg Oral Daily   oxybutynin  5 mg Oral BID   rOPINIRole  0.5 mg Oral QHS   [START ON 12/28/2020] senna-docusate   2 tablet Oral Daily    Assessment:  87 y.o. female with extensive past medical history presents to the ER for evaluation of generalized weakness poor p.o. intake and concern for recurrent UTI. She has hypercalcemia with Ca (corrected): 14.9 mg/dL and a noted intolerance to bisphosphonates  Goal of Therapy:  CorrCa < 14 without AMS, or CorrCa < 12, or 4 doses, whichever occurs first.  Plan:  4 units/kg every 12 hours subcutaneously every 12 hours x 4 doses D/C after: CorrCa < 14 without AMS, or CorrCa < 12, or 4 doses, whichever occurs first  Dallie Piles 12/27/2020,7:34 PM

## 2020-12-27 NOTE — H&P (Addendum)
History and Physical    Laura Cisneros FTD:[id number] DOB: 07-15-1925 DOA: 12/27/2020  PCP: Pcp, No   Patient coming from: Home  I have personally briefly reviewed patient's old medical records in [place]  Chief Complaint: Failure to thrive, decreased p.o. intake.  HPI: Laura Cisneros is a 87 y.o. female with PMH significant for essential hypertension, sick sinus syndrome s/p pacemaker, paroxysmal A. fib on Xarelto, history of breast cancer, Parkinson's disease, restless leg syndrome, urinary incontinence , recurrent UTIs, previously on hospice care following COVID infection now following up with palliative care presented in the ED with generalized weakness, failure to thrive and decreased p.o. intake.   History is obtained from son who reports patient had COVID infection 3 years back and she was under hospice care then she got better and now following with palliative care.  Patient recently dealing with recurrent UTIs.  She has completed 14-day treatment with Bactrim 2 days ago.  Since she started Bactrim she has decreased p.o. intake,  feeling generally weak and and had a downward decline.  Family has been Pedialyte but they feel she is severely dehydrated so they brought her in the ED.  ED Course: She is hemodynamically stable. HR 60, RR 16, BP 137/65, SPO2 97% on room air, temp 97.3 Labs include sodium 129, potassium 5.3, chloride 93, bicarb 28, BUN 40, creatinine 2.02, glucose 45, calcium 13.6, anion gap 8, alkaline phosphatase 53, albumin 3.6, AST 12, ALT 5, total protein 7.0, total protein 1.5, WBC 7.5, hemoglobin 13.0, hematocrit 39.0, MCV 100.0, platelet 270, UA is pending. Chest x-ray: No evidence of pulmonary edema or pneumonia.  Pacemaker and cardiomegaly noted.  Review of Systems:  Review of Systems  Constitutional:  Positive for malaise/fatigue.       Decreased p.o. intake, failure to thrive,  HENT: Negative.    Eyes: Negative.   Respiratory: Negative.    Cardiovascular:  Negative.   Gastrointestinal: Negative.   Genitourinary:  Positive for frequency.       Recurrent UTIs.  Skin: Negative.   Neurological:  Positive for weakness.  Endo/Heme/Allergies: Negative.   Psychiatric/Behavioral:  The patient has insomnia.        Parkinson's disease, restless leg syndrome   Past Medical History:  Diagnosis Date   A-fib ([place])    unspecified   Anemia    unspecified   Arthritis    osteoarthritis   Bleeding hemorrhoid    Breast cancer ([place])    unspecified   Cancer ([place])    Breast   Cataract    Diverticulosis    DVT (deep vein thrombosis) in pregnancy    Hiatal hernia    Hypertension    Incontinence    Osteoarthritis    Osteoporosis    a. Intolerance to Fosamax b. Bilateral foot fractures c. IV Boniva d. Low vitamin D e. Reclast   Parkinson disease ([place])    PE (pulmonary thromboembolism) ([place])    Post-menopausal atrophic vaginitis 01/28/2017   Scleritis    Idiopathic a. Prednisone b. s/p methotrexate   TIA (transient ischemic attack) 1997   Possible   Urinary urgency    Vitamin B12 deficiency     Past Surgical History:  Procedure Laterality Date   ABDOMINAL HYSTERECTOMY     ABDOMINAL HYSTERECTOMY     partial   BLADDER TACK SURGERY     Dr. Ouida Sills   BREAST BIOPSY Left 10/2012   benign   BREAST BIOPSY Left 11/22/2015   invasive mammary ca  BREAST EXCISIONAL BIOPSY Left 12/08/2015   lumpectomy   BREAST LUMPECTOMY Left 11/2015   Invasive mammary carcinoma Grade I   EYE SURGERY Bilateral    Catarct Extraction with IOL   FINGER SURGERY Right    FOOT FRACTURE SURGERY Right 06/2003   pinning, Dr. Francia Greaves, [facility]   FOOT SURGERY Left    INCONTINENCE SURGERY     NEUROMA SURGERY     PACEMAKER INSERTION Right 10/02/2016   Procedure: INSERTION PACEMAKER;  Surgeon: Isaias Cowman, MD;  Location: [facility] ORS;  Service: Cardiovascular;  Laterality: Right;   PARTIAL MASTECTOMY WITH AXILLARY SENTINEL LYMPH NODE BIOPSY Left 12/08/2015   Procedure:  PARTIAL MASTECTOMY WITH AXILLARY SENTINEL LYMPH NODE BIOPSY;  Surgeon: [name], MD;  Location: [facility] ORS;  Service: General;  Laterality: Left;     reports that she has never smoked. She has never used smokeless tobacco. She reports that she does not drink alcohol and does not use drugs.  No Known Allergies  Family History  Problem Relation Age of Onset   Esophageal cancer Sister    Kidney disease Neg Hx    Bladder Cancer Neg Hx    Breast cancer Neg Hx     Family history reviewed and not pertinent .  Prior to Admission medications   Medication Sig Start Date End Date Taking? Authorizing Provider  acetaminophen (TYLENOL) 325 MG tablet Take 650 mg by mouth every 6 (six) hours as needed.  03/01/18   [provider]  bisacodyl (DULCOLAX) 10 MG suppository Place 10 mg rectally daily as needed for moderate constipation.    [provider]  calcium carbonate (OSCAL) 1500 (600 Ca) MG TABS tablet Take 2 tablets by mouth daily.     [provider]  carbidopa-levodopa (SINEMET CR) 50-200 MG tablet Take 3 tablets by mouth 3 (three) times daily.    [provider]  chlorhexidine (HIBICLENS) 4 % external liquid Apply topically daily as needed. 10/31/20   Zara Council A, PA-C  conjugated estrogens (PREMARIN) vaginal cream Apply 0.5 mg ( pea-sized amount) just inside the vaginal introitus with finger-tip weekly on Monday nights    [provider]  Cyanocobalamin (VITAMIN B-12 IJ) Inject 1 mL into the muscle every 30 (thirty) days. Once a day on the 1st of the month    [provider]  diltiazem (CARDIZEM) 120 MG tablet Take 120 mg by mouth daily.    [provider]  furosemide (LASIX) 40 MG tablet Take 40 mg by mouth daily.     [provider]  Hyprom-Naphaz-Polysorb-Zn Sulf (CLEAR EYES COMPLETE) SOLN Place 2 drops into both eyes every 2 (two) hours as needed. 12/21/17   [provider]  latanoprost (XALATAN)  0.005 % ophthalmic solution Place 1 drop into both eyes at bedtime. Per Dr. Wallace Going    [provider]  Lidocaine HCl 4 % CREA Apply liberal amount topically  to shoulders/ neck or any other area of pain TID prn for pain/OA    [provider]  melatonin 5 MG TABS 5 mg. 11/18/17   [provider]  methenamine (HIPREX) 1 g tablet Take 1 tablet (1 g total) by mouth 2 (two) times daily with a meal. 12/12/20   McGowan, Larene Beach A, PA-C  mirabegron ER (MYRBETRIQ) 25 MG TB24 tablet Take 25 mg by mouth daily.    [provider]  NON FORMULARY Diet Type: Regular diet. Small portions    [provider]  ondansetron (ZOFRAN) 4 MG tablet Take  4 mg by mouth every 6 (six) hours as needed for nausea or vomiting.    [provider]  oxybutynin (DITROPAN) 5 MG tablet Take 5 mg by mouth 2 (two) times daily.    [provider]  potassium chloride SA (K-DUR,KLOR-CON) 20 MEQ tablet Take 20 mEq by mouth daily.     [provider]  rivaroxaban (XARELTO) 20 MG TABS tablet Take 20 mg by mouth daily with supper.     [provider]  rOPINIRole (REQUIP) 0.5 MG tablet Take 1 tablet (0.5 mg total) by mouth at bedtime. 10/04/16   [name], MD  senna-docusate (SENOKOT-S) 8.6-50 MG tablet Take 2 tablets by mouth daily.    [provider]  sulfamethoxazole-trimethoprim (BACTRIM DS) 800-160 MG tablet Take 1 tablet by mouth every 12 (twelve) hours. 12/19/20   Zara Council A, PA-C  vitamin B-12 (CYANOCOBALAMIN) 500 MCG tablet Take 500 mcg by mouth daily.    [provider]    Physical Exam: Vitals:   12/27/20 1045 12/27/20 1047 12/27/20 1430  BP: 122/78  137/65  Pulse: 61  60  Resp: 16  16  Temp: (!) 97.3 F (36.3 C)    TempSrc: Oral    SpO2: 96%  97%  Weight:  44.9 kg   Height:  5' (1.524 m)     Constitutional: Appears comfortable, chronically ill looking, frail, severely dehydrated. Vitals:   12/27/20 1045  12/27/20 1047 12/27/20 1430  BP: 122/78  137/65  Pulse: 61  60  Resp: 16  16  Temp: (!) 97.3 F (36.3 C)    TempSrc: Oral    SpO2: 96%  97%  Weight:  44.9 kg   Height:  5' (1.524 m)    Eyes: PERRL, lids and conjunctivae normal ENMT: Mucous membranes are dry. Posterior pharynx no exudate or lesion..Normal dentition.  Neck: normal, supple, no masses, no thyromegaly Respiratory: Clear to auscultation bilaterally, respiratory effort normal, no accessory muscle use, RR 15 Cardiovascular: S1-S2 heard, regular rate and rhythm, no murmur, pacemaker noted. Abdomen: Abdomen is soft, nontender, nondistended, BS+ Musculoskeletal: No joint deformity upper and lower extremities. Good ROM, no contractures. Normal muscle tone.  Skin: no rashes, lesions, ulcers. No induration Neurologic: CN 2-12 grossly intact. Sensation intact, DTR normal. Strength 5/5 in all 4.  Able to move all 4 extremities. Psychiatric: Normal judgment and insight. Alert and oriented x 3. Normal mood.     Labs on Admission: I have personally reviewed following labs and imaging studies  CBC: Recent Labs  Lab 12/27/20 1101  WBC 7.5  HGB 13.0  HCT 39.0  MCV 100.0  PLT 160   Basic Metabolic Panel: Recent Labs  Lab 12/27/20 1101  NA 129*  K 5.3*  CL 93*  CO2 28  GLUCOSE 45*  BUN 40*  CREATININE 2.02*  CALCIUM 13.6*   GFR: Estimated Creatinine Clearance: 12.3 mL/min (A) (by C-G formula based on SCr of 2.02 mg/dL (H)). Liver Function Tests: Recent Labs  Lab 12/27/20 1101  AST 12*  ALT <5  ALKPHOS 53  BILITOT 1.5*  PROT 7.0  ALBUMIN 3.6   No results for input(s): LIPASE, AMYLASE in the last 168 hours. No results for input(s): AMMONIA in the last 168 hours. Coagulation Profile: No results for input(s): INR, PROTIME in the last 168 hours. Cardiac Enzymes: No results for input(s): CKTOTAL, CKMB, CKMBINDEX, TROPONINI in the last 168 hours. BNP (last 3 results) No results for input(s): PROBNP in the last  8760 hours. HbA1C:  No results for input(s): HGBA1C in the last 72 hours. CBG: No results for input(s): GLUCAP in the last 168 hours. Lipid Profile: No results for input(s): CHOL, HDL, LDLCALC, TRIG, CHOLHDL, LDLDIRECT in the last 72 hours. Thyroid Function Tests: No results for input(s): TSH, T4TOTAL, FREET4, T3FREE, THYROIDAB in the last 72 hours. Anemia Panel: No results for input(s): VITAMINB12, FOLATE, FERRITIN, TIBC, IRON, RETICCTPCT in the last 72 hours. Urine analysis:    Component Value Date/Time   COLORURINE YELLOW (A) 05/23/2018 2055   APPEARANCEUR Turbid (A) 12/12/2020 1330   LABSPEC 1.016 05/23/2018 2055   LABSPEC 1.014 08/16/2013 0833   PHURINE 5.0 05/23/2018 2055   GLUCOSEU Negative 12/12/2020 1330   GLUCOSEU Negative 08/16/2013 0833   HGBUR NEGATIVE 05/23/2018 2055   BILIRUBINUR Negative 12/12/2020 1330   BILIRUBINUR Negative 08/16/2013 0833   KETONESUR 5 (A) 05/23/2018 2055   PROTEINUR 2+ (A) 12/12/2020 1330   PROTEINUR NEGATIVE 05/23/2018 2055   NITRITE Positive (A) 12/12/2020 1330   NITRITE NEGATIVE 05/23/2018 2055   LEUKOCYTESUR 3+ (A) 12/12/2020 1330   LEUKOCYTESUR MODERATE (A) 05/23/2018 2055   LEUKOCYTESUR 3+ 08/16/2013 8119    Radiological Exams on Admission: DG Chest Portable 1 View  Result Date: 12/27/2020 CLINICAL DATA:  Weakness.  Question pulmonary edema. EXAM: PORTABLE CHEST 1 VIEW COMPARISON:  10/02/2016 FINDINGS: Pacemaker as seen previously. Chronic cardiomegaly and aortic atherosclerosis. Chronic hiatal hernia. No evidence of pulmonary edema or pleural effusion. No visible pneumonia. IMPRESSION: Cardiomegaly.  Aortic atherosclerosis.  Pacemaker. Large hiatal hernia. No sign of acute pulmonary edema or pneumonia. Electronically Signed   By: Nelson Chimes M.D.   On: 12/27/2020 12:44    EKG: Not completed.  please obtain EKG and review.  Assessment/Plan Principal Problem:   Hypercalcemia Active Problems:   Osteoporosis without current  pathological fracture   Parkinson disease (HCC)   AF (paroxysmal atrial fibrillation) (HCC)   PE (pulmonary thromboembolism) (HCC)   Sick sinus syndrome (HCC)   Hypertensive heart disease with heart failure (HCC)   Restless leg syndrome   Overactive bladder   Dysphagia, oropharyngeal phase   Osteoarthritis   Frequent UTI   Hypercalcemia: Patient presented with serum calcium 13.6. This could be due to severe dehydration. It could be sec.to malignancy. Continue NS IV hydration.  Continue to monitor serum calcium. Nephro consulted.  Hyperkalemia: This could be due to dehydration and AKI. Patient has been using lasix regularly. Will hold Lasix, continue to monitor serum potassium. Recheck BMP  Hyponatremia: This could be due to dehydration.  Continue NS 100 cc/h Continue to monitor serum sodium.  Acute kidney injury: Suspect prerenal could be due to decreased PO intake, and continued diuretic use. Baseline serum creatinine 0.9, presented with creatinine 2.2 Continue IV hydration, continue to monitor serum creatinine. Avoid nephrotoxic medications.  Parkinson's disease Continue levodopa and carbidopa.  S/p pacemaker; Stable, continue to monitor  Recurrent UTIs: Patient does have  history of recurrent UTI. She just completed 14 days course of Bactrim 2 days ago. Patient denies any urinary symptoms. Obtain UA and urine culture.  Paroxysmal A. Fib: Continue Cardizem and hold Xarelto since serum creatinine is up. Resume Xarelto once serum creatinine improves. Or changed to Eliquis.  Restless leg syndrome: Continue ropinirole.  Essential hypertension: Continue Cardizem, hold Lasix for now  Urinary retention: Continue Myrbetriq and oxybutynin  History of breast cancer: Follow-up outpatient.    DVT prophylaxis: Heparin sq Code Status: DNR Family Communication: Son at bed side. Disposition Plan:   Status is:  Observation  The patient remains OBS appropriate  and will d/c before 2 midnights.  Admitted for severe dehydration, hypercalcemia and electrolyte abnormalities.  Needs IV hydration   Consults called: Nephrology Dr. Holley Raring( secure text) Admission status: Observation   Shawna Clamp MD [facility]   If 7PM-7AM, please contact night-coverage   12/27/2020, 4:48 PM

## 2020-12-27 NOTE — ED Triage Notes (Signed)
Pt to ED from [place] with c/o weakness, she was diagnosised with a UTI and was placed on a antibiotic and was not helping they changed her antibiotics. The last few days she has become more weak. She was on hospice care got better, and upgraded her to pallative care. They tried calling the pallative nurse but she was sick so they brought her here to be evaluated.

## 2020-12-27 NOTE — Plan of Care (Signed)
  Problem: Education: Goal: Knowledge of General Education information will improve Description Including pain rating scale, medication(s)/side effects and non-pharmacologic comfort measures Outcome: Progressing   Problem: Health Behavior/Discharge Planning: Goal: Ability to manage health-related needs will improve Outcome: Progressing   

## 2020-12-28 DIAGNOSIS — G2581 Restless legs syndrome: Secondary | ICD-10-CM | POA: Diagnosis present

## 2020-12-28 DIAGNOSIS — I480 Paroxysmal atrial fibrillation: Secondary | ICD-10-CM | POA: Diagnosis present

## 2020-12-28 DIAGNOSIS — N179 Acute kidney failure, unspecified: Secondary | ICD-10-CM | POA: Diagnosis present

## 2020-12-28 DIAGNOSIS — Z853 Personal history of malignant neoplasm of breast: Secondary | ICD-10-CM | POA: Diagnosis not present

## 2020-12-28 DIAGNOSIS — E43 Unspecified severe protein-calorie malnutrition: Secondary | ICD-10-CM | POA: Diagnosis present

## 2020-12-28 DIAGNOSIS — G20 Parkinson's disease: Secondary | ICD-10-CM | POA: Diagnosis present

## 2020-12-28 DIAGNOSIS — Z87440 Personal history of urinary (tract) infections: Secondary | ICD-10-CM | POA: Diagnosis not present

## 2020-12-28 DIAGNOSIS — I509 Heart failure, unspecified: Secondary | ICD-10-CM | POA: Diagnosis present

## 2020-12-28 DIAGNOSIS — E860 Dehydration: Secondary | ICD-10-CM | POA: Diagnosis present

## 2020-12-28 DIAGNOSIS — Z7901 Long term (current) use of anticoagulants: Secondary | ICD-10-CM | POA: Diagnosis not present

## 2020-12-28 DIAGNOSIS — Z8616 Personal history of COVID-19: Secondary | ICD-10-CM | POA: Diagnosis not present

## 2020-12-28 DIAGNOSIS — N3281 Overactive bladder: Secondary | ICD-10-CM | POA: Diagnosis present

## 2020-12-28 DIAGNOSIS — Z681 Body mass index (BMI) 19 or less, adult: Secondary | ICD-10-CM | POA: Diagnosis not present

## 2020-12-28 DIAGNOSIS — I110 Hypertensive heart disease with heart failure: Secondary | ICD-10-CM | POA: Diagnosis present

## 2020-12-28 DIAGNOSIS — I495 Sick sinus syndrome: Secondary | ICD-10-CM | POA: Diagnosis present

## 2020-12-28 DIAGNOSIS — Z8673 Personal history of transient ischemic attack (TIA), and cerebral infarction without residual deficits: Secondary | ICD-10-CM | POA: Diagnosis not present

## 2020-12-28 DIAGNOSIS — Z86711 Personal history of pulmonary embolism: Secondary | ICD-10-CM | POA: Diagnosis not present

## 2020-12-28 DIAGNOSIS — R1312 Dysphagia, oropharyngeal phase: Secondary | ICD-10-CM | POA: Diagnosis present

## 2020-12-28 DIAGNOSIS — Z66 Do not resuscitate: Secondary | ICD-10-CM | POA: Diagnosis present

## 2020-12-28 DIAGNOSIS — E871 Hypo-osmolality and hyponatremia: Secondary | ICD-10-CM | POA: Diagnosis present

## 2020-12-28 DIAGNOSIS — M159 Polyosteoarthritis, unspecified: Secondary | ICD-10-CM | POA: Diagnosis not present

## 2020-12-28 DIAGNOSIS — Z515 Encounter for palliative care: Secondary | ICD-10-CM | POA: Diagnosis not present

## 2020-12-28 DIAGNOSIS — R627 Adult failure to thrive: Secondary | ICD-10-CM | POA: Diagnosis present

## 2020-12-28 DIAGNOSIS — E875 Hyperkalemia: Secondary | ICD-10-CM | POA: Diagnosis present

## 2020-12-28 LAB — CBC
HCT: 38.9 % (ref 36.0–46.0)
Hemoglobin: 13.1 g/dL (ref 12.0–15.0)
MCH: 32.8 pg (ref 26.0–34.0)
MCHC: 33.7 g/dL (ref 30.0–36.0)
MCV: 97.3 fL (ref 80.0–100.0)
Platelets: 249 10*3/uL (ref 150–400)
RBC: 4 MIL/uL (ref 3.87–5.11)
RDW: 13.5 % (ref 11.5–15.5)
WBC: 7.9 10*3/uL (ref 4.0–10.5)
nRBC: 0 % (ref 0.0–0.2)

## 2020-12-28 LAB — COMPREHENSIVE METABOLIC PANEL
ALT: <5 U/L (ref 0–44)
AST: 9 U/L — ABNORMAL LOW (ref 15–41)
Albumin: 3.5 g/dL (ref 3.5–5.0)
Alkaline Phosphatase: 58 U/L (ref 38–126)
Anion gap: 8 (ref 5–15)
BUN: 37 mg/dL — ABNORMAL HIGH (ref 8–23)
CO2: 27 mmol/L (ref 22–32)
Calcium: 13.1 mg/dL (ref 8.9–10.3)
Chloride: 99 mmol/L (ref 98–111)
Creatinine, Ser: 1.63 mg/dL — ABNORMAL HIGH (ref 0.44–1.00)
GFR, Estimated: 29 mL/min — ABNORMAL LOW (ref >60)
Glucose, Bld: 78 mg/dL (ref 70–99)
Potassium: 4.7 mmol/L (ref 3.5–5.1)
Sodium: 134 mmol/L — ABNORMAL LOW (ref 135–145)
Total Bilirubin: 1.3 mg/dL — ABNORMAL HIGH (ref 0.3–1.2)
Total Protein: 6.6 g/dL (ref 6.5–8.1)

## 2020-12-28 LAB — MAGNESIUM: Magnesium: 1.9 mg/dL (ref 1.7–2.4)

## 2020-12-28 LAB — PHOSPHORUS: Phosphorus: 4.3 mg/dL (ref 2.5–4.6)

## 2020-12-28 LAB — GLUCOSE, CAPILLARY: Glucose-Capillary: 87 mg/dL (ref 70–99)

## 2020-12-28 LAB — CALCIUM: Calcium: 11.9 mg/dL — ABNORMAL HIGH (ref 8.9–10.3)

## 2020-12-28 MED ORDER — ENSURE ENLIVE PO LIQD
237.0000 mL | Freq: Two times a day (BID) | ORAL | Status: DC
  Administered 2020-12-29 – 2020-12-31 (×4): 237 mL via ORAL

## 2020-12-28 MED ORDER — MIRTAZAPINE 15 MG PO TBDP
15.0000 mg | ORAL_TABLET | Freq: Every day | ORAL | Status: DC
  Administered 2020-12-28 – 2020-12-30 (×3): 15 mg via ORAL
  Filled 2020-12-28 (×5): qty 1

## 2020-12-28 MED ORDER — SODIUM CHLORIDE 0.9 % IV SOLN: INTRAVENOUS | Status: AC

## 2020-12-28 MED ORDER — ADULT MULTIVITAMIN W/MINERALS CH
1.0000 | ORAL_TABLET | Freq: Every day | ORAL | Status: DC
  Administered 2020-12-29 – 2020-12-31 (×3): 1 via ORAL
  Filled 2020-12-28 (×4): qty 1

## 2020-12-28 NOTE — Progress Notes (Signed)
SLP Cancellation Note  Patient Details Name: BAYLEIGH LOFLIN MRN: [id number] DOB: [birth year]   Cancelled treatment:       Reason Eval/Treat Not Completed: Patient declined, no reason specified (chart reviewed; consulted NSG and MD, then met w/ family in room.).  Per family report, pt has not been eating/drinking at home and "gets those UTIs". Pt has received Hospice services previously; now followed by Palliative Care.  Today, pt has swallowed Pills in Puree w/out difficulty per NSG report. She is REFUSING all other po's including sips of water (only took 1 per family) and the lunch meal/soup they ordered for her. Noted tangential, mumbled speech; Confusion. When offered po's, pt declined. She stated she wanted to "get in her bed". Family stated she had been Confused all morning and indicated similar at home -- suspect she is near her baseline.  ST services will f/u tomorrow to attempt BSE if pt is agreeable. MD stated POC is to stabilize pt and discharge her back home as per family wishes. Briefly discussed general aspiration precautions w/ any po's; oral care as she allows. Family agreed. NSG/MD updated.      Orinda Kenner, MS, CCC-SLP Speech Language Pathologist Rehab Services [phone] [facility] 12/28/2020, 1:45 PM

## 2020-12-28 NOTE — Progress Notes (Signed)
Initial Nutrition Assessment  DOCUMENTATION CODES:   Severe malnutrition in context of chronic illness  INTERVENTION:   -Ensure Enlive po BID, each supplement provides 350 kcal and 20 grams of protein  -Magic cup TID with meals, each supplement provides 290 kcal and 9 grams of protein  -MVI with minerals daily -Feeding assistance with meals  NUTRITION DIAGNOSIS:   Severe Malnutrition related to chronic illness (Parkinson's) as evidenced by moderate fat depletion, severe fat depletion, moderate muscle depletion, severe muscle depletion, percent weight loss.  GOAL:   Patient will meet greater than or equal to 90% of their needs  MONITOR:   PO intake, Supplement acceptance, Labs, Weight trends, Skin, I & O's  REASON FOR ASSESSMENT:   Consult Assessment of nutrition requirement/status  ASSESSMENT:   Laura Cisneros is a 87 y.o. female with PMH significant for essential hypertension, sick sinus syndrome s/p pacemaker, paroxysmal A. fib on Xarelto, history of breast cancer, Parkinson's disease, restless leg syndrome, urinary incontinence , recurrent UTIs, previously on hospice care following COVID infection now following up with palliative care presented in the ED with generalized weakness, failure to thrive and decreased p.o. intake.  Pt admitted with hypercalcemia.   Reviewed I/O's: +140 ml x 24 hours  UOP: 600 ml x 24 hours  Pt unable to provide any history, but was initially nervous when RD walked in because she was afraid that she was going to get her blood drawn again. RD assured her that this would not happed during visit.   Pt family at bedside unable to provide history, stating "we just got up here". No meal completion data available to assess, but noted many opened beverages on tray table. Pt with no teeth, so agree with dysphagia 3 diet for ease of intake.    Reviewed wt hx; pt has experienced a 12.5% wt loss over the past month, which is significant for time frame.  Suspect poor oral intake PTA.   Medications reviewed and include sinemet, colace, cardizem, and senokot.   Labs reviewed: Na: 134, CBGS: 31-110 (inpatient orders for glycemic control are none).    NUTRITION - FOCUSED PHYSICAL EXAM:  Flowsheet Row Most Recent Value  Orbital Region Severe depletion  Upper Arm Region Severe depletion  Thoracic and Lumbar Region Moderate depletion  Buccal Region Severe depletion  Temple Region Severe depletion  Clavicle Bone Region Severe depletion  Clavicle and Acromion Bone Region Severe depletion  Scapular Bone Region Severe depletion  Dorsal Hand Severe depletion  Patellar Region Severe depletion  Anterior Thigh Region Severe depletion  Posterior Calf Region Severe depletion  Edema (RD Assessment) None  Hair Reviewed  Eyes Reviewed  Mouth Reviewed  Skin Reviewed  Nails Reviewed       Diet Order:   Diet Order             DIET DYS 3 Room service appropriate? Yes; Fluid consistency: Thin  Diet effective now                   EDUCATION NEEDS:   No education needs have been identified at this time  Skin:  Skin Assessment: Reviewed RN Assessment  Last BM:  Unknown  Height:   Ht Readings from Last 1 Encounters:  12/27/20 5' (1.524 m)    Weight:   Wt Readings from Last 1 Encounters:  12/27/20 44.9 kg    Ideal Body Weight:  45.5 kg  BMI:  Body mass index is 19.33 kg/m.  Estimated Nutritional Needs:  Kcal:  1400-1600  Protein:  65-80 grams  Fluid:  > 1.4 L    Loistine Chance, RD, LDN, [place] Registered Dietitian II Certified Diabetes Care and Education Specialist Please refer to [facility] for RD and/or RD on-call/weekend/after hours pager

## 2020-12-28 NOTE — Progress Notes (Signed)
[facility] [place] NAME: Jim Philemon    MR#:  [id number]  DATE OF BIRTH:  01-09-1926  SUBJECTIVE:  patient very soft-spoken. Son Laura Cisneros at bedside. Patient was brought in with poor PO intake confusion and weakness at home. According to the son patient had two rounds of antibiotic just recently for her recurrent UTI. He tells me since after the course of antibiotic patient's appetite has gone down. Patient was found calcium of 14.3.   REVIEW OF SYSTEMS:   Review of Systems  Unable to perform ROS: Mental acuity  Tolerating Diet: Tolerating PT:   DRUG ALLERGIES:  No Known Allergies  VITALS:  Blood pressure (!) 119/59, pulse (!) 59, temperature 98.5 F (36.9 C), resp. rate 16, height 5' (1.524 m), weight 44.9 kg, SpO2 97 %.  PHYSICAL EXAMINATION:   Physical Exam  GENERAL:  87 y.o.-year-old patient lying in the bed with no acute distress. Thin fraile HEENT: Head atraumatic, normocephalic. Oropharynx and nasopharynx clear.  LUNGS: Normal breath sounds bilaterally, no wheezing, rales, rhonchi. No use of accessory muscles of respiration.  CARDIOVASCULAR: S1, S2 normal. No murmurs, rubs, or gallops.  ABDOMEN: Soft, nontender, nondistended. Bowel sounds present. No organomegaly or mass.  EXTREMITIES: No cyanosis, clubbing or edema b/l.    NEUROLOGIC: nonfocal PSYCHIATRIC:  patient is alert  SKIN: No obvious rash, lesion, or ulcer.   LABORATORY PANEL:  CBC Recent Labs  Lab 12/28/20 0505  WBC 7.9  HGB 13.1  HCT 38.9  PLT 249    Chemistries  Recent Labs  Lab 12/28/20 0505 12/28/20 1343  NA 134*  --   K 4.7  --   CL 99  --   CO2 27  --   GLUCOSE 78  --   BUN 37*  --   CREATININE 1.63*  --   CALCIUM 13.1* 11.9*  MG 1.9  --   AST 9*  --   ALT <5  --   ALKPHOS 58  --   BILITOT 1.3*  --    Cardiac Enzymes No results for input(s): TROPONINI in the last 168 hours. RADIOLOGY:  DG Chest Portable 1 View  Result Date:  12/27/2020 CLINICAL DATA:  Weakness.  Question pulmonary edema. EXAM: PORTABLE CHEST 1 VIEW COMPARISON:  10/02/2016 FINDINGS: Pacemaker as seen previously. Chronic cardiomegaly and aortic atherosclerosis. Chronic hiatal hernia. No evidence of pulmonary edema or pleural effusion. No visible pneumonia. IMPRESSION: Cardiomegaly.  Aortic atherosclerosis.  Pacemaker. Large hiatal hernia. No sign of acute pulmonary edema or pneumonia. Electronically Signed   By: Nelson Chimes M.D.   On: 12/27/2020 12:44   ASSESSMENT AND PLAN:   Laura Cisneros is a 87 y.o. female with PMH significant for essential hypertension, sick sinus syndrome s/p pacemaker, paroxysmal A. fib on Xarelto, history of breast cancer, Parkinson's disease, restless leg syndrome, urinary incontinence , recurrent UTIs, previously on hospice care following COVID infection now following up with palliative care presented in the ED with generalized weakness, failure to thrive and decreased p.o. intake.  Patient recently finished two rounds of antibiotic. Last antibiotic was Bactrim double strength for seven days  hypercalcemia with acute renal failure/prerenal azotemia failure to thrive poor PO intake Dehydration -- patient came in with calcium of 13.6--- 14.6--- 13.1-- 11.9 -- continue IV fluids -- receiving calcitonin -- creatinine improving patient came in with creatinine of 2.02-- 2.07-- 1.63 -- baseline creatinine 0.03 Jun 2020 -- avoid nephrotoxic agents  failure to thrive  Malnutrition --  will give trial of Remeron -- dietitian to see patient -- patient has been followed by palliative care as outpatient. Will resume that at discharge.  Parkinson's disease --cont levodopa carbidopa  recurrent UTIs -- recently completed two rounds of antibiotics -- denies any urinary symptoms -- continue to monitor  hyperkalemia -- resolved with IV fluids   Procedures: Family communication : son Laura Cisneros at bedside Consults :heparin CODE  STATUS: DNR DVT Prophylaxis : Level of care: Progressive Status is: Inpatient  Remains inpatient appropriate because: hypercalcemia, dehydration, failure to thrive        TOTAL TIME TAKING CARE OF THIS PATIENT: 30 minutes.  >50% time spent on counselling and coordination of care  Note: This dictation was prepared with Dragon dictation along with smaller phrase technology. Any transcriptional errors that result from this process are unintentional.  Fritzi Mandes M.D    [facility]   CC: Primary care physician; Pcp, No Patient ID: Laura Cisneros, female   DOB: 02-24-1925, 87 y.o.   MRN: [id number]

## 2020-12-28 NOTE — NC FL2 (Signed)
[place] LEVEL OF CARE SCREENING TOOL     IDENTIFICATION  Patient Name: Laura Cisneros Birthdate: [birth year] Sex: female Admission Date (Current Location): 12/27/2020  [facility] and [place] Number:  Engineering geologist and Address:  [facility], [address], [place], [place] [address]      Provider Number: [id number]  Attending Physician Name and Address:  Fritzi Mandes, MD  Relative Name and Phone Number:  son [name] [phone]    Current Level of Care: Hospital Recommended Level of Care: [place] Prior Approval Number:    Date Approved/Denied:   PASRR Number: [id number]  Discharge Plan: SNF    Current Diagnoses: Patient Active Problem List   Diagnosis Date Noted   Hypercalcemia 12/27/2020   Macrocytic anemia 09/29/2019   Frequent UTI 03/14/2018   Chronic bilateral low back pain 02/14/2018   Chronic constipation 12/05/2017   Glaucoma (increased eye pressure) 08/04/2017   Osteoarthritis 04/30/2017   Sick sinus syndrome ([place]) 01/28/2017   Hypertensive heart disease with heart failure ([place]) 01/28/2017   Chronic diastolic (congestive) heart failure ([place]) 01/28/2017   Restless leg syndrome 01/28/2017   Deficiency of other specified B group vitamins 01/28/2017   Post-menopausal atrophic vaginitis 01/28/2017   Personal history of malignant neoplasm of breast 01/28/2017   Overactive bladder 01/28/2017   Dysphagia, oropharyngeal phase 01/28/2017   Dry eyes 01/28/2017   Insomnia 01/28/2017   Weakness generalized 10/25/2016   Deep vein thrombosis (DVT) of distal vein of right lower extremity (HCC) 09/18/2016   Parkinson disease ([place]) 06/18/2016   AF (paroxysmal atrial fibrillation) ([place]) 06/18/2016   PE (pulmonary thromboembolism) ([place]) 06/18/2016   Osteoporosis without current pathological fracture 12/04/2015    Orientation RESPIRATION BLADDER Height & Weight     Self, Time, Situation, Place  Normal  Incontinent, External catheter Weight: 99 lb (44.9 kg) Height:  5' (152.4 cm)  BEHAVIORAL SYMPTOMS/MOOD NEUROLOGICAL BOWEL NUTRITION STATUS      Incontinent Diet (see discharge summary)  AMBULATORY STATUS COMMUNICATION OF NEEDS Skin   Extensive Assist Verbally Other (Comment) (rash sacrum)                       Personal Care Assistance Level of Assistance  Bathing, Feeding, Dressing, Total care Bathing Assistance: Maximum assistance Feeding assistance: Limited assistance Dressing Assistance: Maximum assistance Total Care Assistance: Maximum assistance   Functional Limitations Info  Sight, Hearing, Speech Sight Info: Adequate Hearing Info: Adequate Speech Info: Adequate    SPECIAL CARE FACTORS FREQUENCY  PT (By licensed PT), OT (By licensed OT)     PT Frequency: min 3x weekly OT Frequency: min 3x weekly            Contractures Contractures Info: Not present    Additional Factors Info  Code Status, Allergies Code Status Info: DNR Allergies Info: No Known Allergies           Current Medications (12/28/2020):  This is the current hospital active medication list Current Facility-Administered Medications  Medication Dose Route Frequency Provider Last Rate Last Admin   0.9 %  sodium chloride infusion   Intravenous Continuous Fritzi Mandes, MD 100 mL/hr at 12/28/20 1246 Restarted at 12/28/20 1246   acetaminophen (TYLENOL) tablet 650 mg  650 mg Oral Q6H PRN Shawna Clamp, MD       Or   acetaminophen (TYLENOL) suppository 650 mg  650 mg Rectal Q6H PRN Shawna Clamp, MD       calcitonin (MIACALCIN) injection 180  Units  4 Units/kg Subcutaneous BID Dallie Piles, RPH   180 Units at 12/28/20 0947   carbidopa-levodopa (SINEMET CR) 50-200 MG per tablet controlled release 3 tablet  3 tablet Oral TID Shawna Clamp, MD   3 tablet at 12/28/20 0940   diltiazem (CARDIZEM) tablet 120 mg  120 mg Oral Daily Shawna Clamp, MD   120 mg at 12/28/20 8850   docusate sodium (COLACE)  capsule 100 mg  100 mg Oral BID Shawna Clamp, MD   100 mg at 12/28/20 0939   heparin injection 5,000 Units  5,000 Units Subcutaneous Q8H Shawna Clamp, MD   5,000 Units at 12/28/20 0524   mirabegron ER (MYRBETRIQ) tablet 25 mg  25 mg Oral Daily Shawna Clamp, MD   25 mg at 12/28/20 0940   ondansetron (ZOFRAN) tablet 4 mg  4 mg Oral Q6H PRN Shawna Clamp, MD       Or   ondansetron [facility]) injection 4 mg  4 mg Intravenous Q6H PRN Shawna Clamp, MD       oxybutynin (DITROPAN) tablet 5 mg  5 mg Oral BID Shawna Clamp, MD   5 mg at 12/28/20 2774   rOPINIRole (REQUIP) tablet 0.5 mg  0.5 mg Oral QHS Shawna Clamp, MD   0.5 mg at 12/27/20 2243   senna-docusate (Senokot-S) tablet 2 tablet  2 tablet Oral Daily Shawna Clamp, MD   2 tablet at 12/28/20 1287     Discharge Medications: Please see discharge summary for a list of discharge medications.  Relevant Imaging Results:  Relevant Lab Results:   Additional Information SSN: [id number]  Alberteen Sam, LCSW

## 2020-12-28 NOTE — Evaluation (Signed)
Physical Therapy Evaluation Patient Details Name: Laura Cisneros MRN: [id number] DOB: 1925-05-29 Today's Date: 12/28/2020  History of Present Illness  87 y.o. female presented in the ED with generalized weakness, failure to thrive and decreased p.o. intake. This decline is following recent UTI. PMH significant for essential hypertension, sick sinus syndrome s/p pacemaker, paroxysmal A. fib on Xarelto, history of breast cancer, Parkinson's disease, restless leg syndrome, urinary incontinence , recurrent UTIs, previously on hospice care following COVID infection and now following up with palliative care for previous 3 months.  Clinical Impression  Pt received supine in bed, son at bedside. Son immediately states that his mom can't sit up/move because she is too weak - PT reassured pt would be assisted as necessary, mobility would be taken one step at a time and safety is a priority. Pt was difficult to hear and understand due to vocal weakness; son is able to understand and translate. Pt lives with son with another son living next door. She does not ambulate at baseline however can perform a stand pivot transfer with assist and requires assist with all ADLs.   Pt presents with global weakness; LLE weaker than RLE and limited ROM in L knee and ankle. MAX A was required with rolling and supine<>sit. Pt does participate and follows VC on sequencing with mild delay in processing VC. While sitting EOB, pt trunk drifted posteriorly requiring MOD A to correct and constant CGA for safety. Sitting balance did improve with reassurance of holding her son's hand. Standing to be assessed in future treatment session. PT educated pt and son on d/c recs with son stating they refuse SNF and will be taking pt home. PT offered HHPT with pt stating he will consider the option. Would benefit from skilled PT to address above deficits and promote optimal return to PLOF.      Recommendations for follow up therapy are one component  of a multi-disciplinary discharge planning process, led by the attending physician.  Recommendations may be updated based on patient status, additional functional criteria and insurance authorization.  Follow Up Recommendations Skilled nursing-short term rehab (<3 hours/day) (family plans to refuse SNF - would rec HHPT)    Assistance Recommended at Discharge Frequent or constant Supervision/Assistance  Functional Status Assessment Patient has had a recent decline in their functional status and demonstrates the ability to make significant improvements in function in a reasonable and predictable amount of time.  Equipment Recommendations  None recommended by PT    Recommendations for Other Services       Precautions / Restrictions Precautions Precautions: Fall;ICD/Pacemaker Restrictions Weight Bearing Restrictions: No      Mobility  Bed Mobility Overal bed mobility: Needs Assistance Bed Mobility: Rolling;Supine to Sit;Sit to Supine Rolling: Max assist   Supine to sit: Max assist;HOB elevated Sit to supine: Max assist   General bed mobility comments: MAX A to manage trunk and BLE. Pt does attempt to participate by reaching/pushing with BUE.    Transfers                   General transfer comment: deferred    Ambulation/Gait               General Gait Details: does not ambulate at baseline  Stairs            Wheelchair Mobility    Modified Rankin (Stroke Patients Only)       Balance Overall balance assessment: Needs assistance Sitting-balance support: Bilateral upper extremity supported Sitting  balance-Leahy Scale: Poor Sitting balance - Comments: occasional posterior LOB while sitting EOB; post drift with fatigue requiring assist to correct. Balance did improve mildly with reassurance of HHA by son. Postural control: Posterior lean                                   Pertinent Vitals/Pain Pain Assessment: No/denies pain     Home Living Family/patient expects to be discharged to:: Private residence Living Arrangements: Children Available Help at Discharge: Family;Available 24 hours/day Type of Home: House Home Access: Ramped entrance       Home Layout: One level Home Equipment: Conservation officer, nature (2 wheels);Transport chair;Hospital bed;BSC/3in1      Prior Function Prior Level of Function : Needs assist       Physical Assist : Mobility (physical);ADLs (physical) Mobility (physical): Bed mobility;Transfers ADLs (physical): Grooming;Bathing;Dressing;Toileting;IADLs Mobility Comments: Non-ambulatory at baseline. Performs stand pivot transfer to transport chair with 1 person assist, does not use RW. ADLs Comments: Assist with all ADLs.     Hand Dominance        Extremity/Trunk Assessment   Upper Extremity Assessment Upper Extremity Assessment: Generalized weakness    Lower Extremity Assessment Lower Extremity Assessment: Generalized weakness (left appears more weak than right, decreased ROM in L knee and ankle)    Cervical / Trunk Assessment Cervical / Trunk Assessment: Kyphotic  Communication   Communication: No difficulties  Cognition Arousal/Alertness: Awake/alert Behavior During Therapy: WFL for tasks assessed/performed Overall Cognitive Status: Within Functional Limits for tasks assessed                                 General Comments: A&Ox4 (son states she was just asked these questions)        General Comments      Exercises     Assessment/Plan    PT Assessment Patient needs continued PT services  PT Problem List Decreased strength;Decreased mobility;Decreased range of motion;Decreased activity tolerance;Decreased balance       PT Treatment Interventions Therapeutic activities;Therapeutic exercise;Patient/family education;Balance training;Functional mobility training;Neuromuscular re-education    PT Goals (Current goals can be found in the Care Plan  section)  Acute Rehab PT Goals Patient Stated Goal: to go home PT Goal Formulation: With patient/family Time For Goal Achievement: 01/11/21 Potential to Achieve Goals: Fair    Frequency Min 2X/week   Barriers to discharge        Co-evaluation               AM-PAC PT "6 Clicks" Mobility  Outcome Measure Help needed turning from your back to your side while in a flat bed without using bedrails?: A Lot Help needed moving from lying on your back to sitting on the side of a flat bed without using bedrails?: A Lot Help needed moving to and from a bed to a chair (including a wheelchair)?: A Lot Help needed standing up from a chair using your arms (e.g., wheelchair or bedside chair)?: Total Help needed to walk in hospital room?: Total Help needed climbing 3-5 steps with a railing? : Total 6 Click Score: 9    End of Session   Activity Tolerance: Patient limited by fatigue Patient left: in bed;with call bell/phone within reach;with bed alarm set;with family/visitor present Nurse Communication: Mobility status PT Visit Diagnosis: Muscle weakness (generalized) (M62.81);Adult, failure to thrive (R62.7)    Time: 0737-1062  PT Time Calculation (min) (ACUTE ONLY): 36 min   Charges:   PT Evaluation $PT Eval Moderate Complexity: 1 Mod PT Treatments $Therapeutic Activity: 23-37 mins        Patrina Levering PT, DPT 12/28/20 11:28 AM [phone]

## 2020-12-28 NOTE — TOC Initial Note (Addendum)
Transition of Care [facility]) - Initial/Assessment Note    Patient Details  Name: Laura Cisneros MRN: [id number] Date of Birth: 08-02-1925  Transition of [facility]) CM/SW Contact:    Alberteen Sam, LCSW Phone Number: 12/28/2020, 1:59 PM  Clinical Narrative:                  CSW spoke with patient's son Laura Cisneros who reports he is unsure as far as discharging plan at this time. Reports he promised to take patient home, however he's unsure if he's able to care for her at home with increased needs. Reports patient would not be agreeable to go to any SNF except if she was able to get into [facility]. Timothy asked CSW to send referral to [facility] to see if they can accept.   If not, anticipate Laura Cisneros will attempt to take patient home. Currently followed by [facility] palliative. CSW could set up max [facility] services if home was decided upon as discharge plan.   Laura Cisneros reports they already have a hospital bed and transport chair at home with no DME needs.   Referral sent to [facility], pending response if they would be able to accept or if they have any availability.   Expected Discharge Plan: [place] Barriers to Discharge: Continued Medical Work up   Patient Goals and CMS Choice   CMS Medicare.gov Compare Post Acute Care list provided to:: Patient Represenative (must comment) (son) Choice offered to / list presented to : Adult Children  Expected Discharge Plan and Services Expected Discharge Plan: [place]                                              Prior Living Arrangements/Services   Lives with:: Self                   Activities of Daily Living Home Assistive Devices/Equipment: Bedside commode/3-in-1 ADL Screening (condition at time of admission) Patient's cognitive ability adequate to safely complete daily activities?: Yes Is the patient deaf or have difficulty hearing?: No Does the patient have difficulty seeing, even when  wearing glasses/contacts?: No Does the patient have difficulty concentrating, remembering, or making decisions?: No Patient able to express need for assistance with ADLs?: Yes Does the patient have difficulty dressing or bathing?: Yes Independently performs ADLs?: No Does the patient have difficulty walking or climbing stairs?: Yes Weakness of Legs: Both Weakness of Arms/Hands: Both  Permission Sought/Granted                  Emotional Assessment         Alcohol / Substance Use: Not Applicable Psych Involvement: No (comment)  Admission diagnosis:  Hypercalcemia [E83.52] Dehydration [E86.0] Weakness [R53.1] AKI (acute kidney injury) ([place]) [N17.9] Patient Active Problem List   Diagnosis Date Noted   Hypercalcemia 12/27/2020   Macrocytic anemia 09/29/2019   Frequent UTI 03/14/2018   Chronic bilateral low back pain 02/14/2018   Chronic constipation 12/05/2017   Glaucoma (increased eye pressure) 08/04/2017   Osteoarthritis 04/30/2017   Sick sinus syndrome ([place]) 01/28/2017   Hypertensive heart disease with heart failure ([place]) 01/28/2017   Chronic diastolic (congestive) heart failure ([place]) 01/28/2017   Restless leg syndrome 01/28/2017   Deficiency of other specified B group vitamins 01/28/2017   Post-menopausal atrophic vaginitis 01/28/2017   Personal  history of malignant neoplasm of breast 01/28/2017   Overactive bladder 01/28/2017   Dysphagia, oropharyngeal phase 01/28/2017   Dry eyes 01/28/2017   Insomnia 01/28/2017   Weakness generalized 10/25/2016   Deep vein thrombosis (DVT) of distal vein of right lower extremity ([place]) 09/18/2016   Parkinson disease ([place]) 06/18/2016   AF (paroxysmal atrial fibrillation) ([place]) 06/18/2016   PE (pulmonary thromboembolism) ([place]) 06/18/2016   Osteoporosis without current pathological fracture 12/04/2015   PCP:  Pcp, No Pharmacy:   [place], [place] [address] [place]  [address] Phone: [phone] Fax: [place], [place] [address] Phone: [phone] Fax: [phone]     Social Determinants of Health (SDOH) Interventions    Readmission Risk Interventions No flowsheet data found.

## 2020-12-29 ENCOUNTER — Telehealth: Payer: Self-pay | Admitting: Student

## 2020-12-29 DIAGNOSIS — E43 Unspecified severe protein-calorie malnutrition: Secondary | ICD-10-CM | POA: Diagnosis not present

## 2020-12-29 DIAGNOSIS — M159 Polyosteoarthritis, unspecified: Secondary | ICD-10-CM

## 2020-12-29 LAB — BASIC METABOLIC PANEL
Anion gap: 6 (ref 5–15)
BUN: 34 mg/dL — ABNORMAL HIGH (ref 8–23)
CO2: 24 mmol/L (ref 22–32)
Calcium: 10.4 mg/dL — ABNORMAL HIGH (ref 8.9–10.3)
Chloride: 107 mmol/L (ref 98–111)
Creatinine, Ser: 1.22 mg/dL — ABNORMAL HIGH (ref 0.44–1.00)
GFR, Estimated: 41 mL/min — ABNORMAL LOW (ref >60)
Glucose, Bld: 80 mg/dL (ref 70–99)
Potassium: 4.1 mmol/L (ref 3.5–5.1)
Sodium: 137 mmol/L (ref 135–145)

## 2020-12-29 LAB — CALCIUM, IONIZED: Calcium, Ionized, Serum: 7.4 mg/dL — ABNORMAL HIGH (ref 4.5–5.6)

## 2020-12-29 MED ORDER — KETOTIFEN FUMARATE 0.025 % OP SOLN
1.0000 [drp] | Freq: Two times a day (BID) | OPHTHALMIC | Status: DC
  Administered 2020-12-31: 09:00:00 1 [drp] via OPHTHALMIC
  Filled 2020-12-29: qty 5

## 2020-12-29 NOTE — Progress Notes (Signed)
[facility] [place] NAME: Laura Cisneros    MR#:  [id number]  DATE OF BIRTH:  [birth year]  SUBJECTIVE:  patient very soft-spoken. Son Laura Cisneros at bedside. Patient was brought in with poor PO intake confusion and weakness at home. According to the son patient had two rounds of antibiotic just recently for her recurrent UTI. He tells me since after the course of antibiotic patient's appetite has gone down. Patient was found calcium of 14.3.  Patient eating a little bit better according to family. She has periods of some confusion however very pleasant. She pulled her IV out.  REVIEW OF SYSTEMS:   Review of Systems  Unable to perform ROS: Mental acuity  Tolerating Diet: Tolerating PT:   DRUG ALLERGIES:  No Known Allergies  VITALS:  Blood pressure 109/63, pulse (!) 59, temperature 98.3 F (36.8 C), resp. rate 18, height 5' (1.524 m), weight 44.9 kg, SpO2 98 %.  PHYSICAL EXAMINATION:   Physical Exam  GENERAL:  87 y.o.-year-old patient lying in the bed with no acute distress. Thin fraile HEENT: Head atraumatic, normocephalic. Oropharynx and nasopharynx clear.  LUNGS: Normal breath sounds bilaterally, no wheezing, rales, rhonchi. No use of accessory muscles of respiration.  CARDIOVASCULAR: S1, S2 normal. No murmurs, rubs, or gallops.  ABDOMEN: Soft, nontender, nondistended. Bowel sounds present. No organomegaly or mass.  EXTREMITIES: No cyanosis, clubbing or edema b/l.    NEUROLOGIC: nonfocal PSYCHIATRIC:  patient is alert  SKIN: No obvious rash, lesion, or ulcer.   LABORATORY PANEL:  CBC Recent Labs  Lab 12/28/20 0505  WBC 7.9  HGB 13.1  HCT 38.9  PLT 249     Chemistries  Recent Labs  Lab 12/28/20 0505 12/28/20 1343 12/29/20 0521  NA 134*  --  137  K 4.7  --  4.1  CL 99  --  107  CO2 27  --  24  GLUCOSE 78  --  80  BUN 37*  --  34*  CREATININE 1.63*  --  1.22*  CALCIUM 13.1*   < > 10.4*  MG 1.9  --   --   AST 9*  --    --   ALT <5  --   --   ALKPHOS 58  --   --   BILITOT 1.3*  --   --    < > = values in this interval not displayed.    Cardiac Enzymes No results for input(s): TROPONINI in the last 168 hours. RADIOLOGY:  No results found. ASSESSMENT AND PLAN:   Laura Cisneros is a 87 y.o. female with PMH significant for essential hypertension, sick sinus syndrome s/p pacemaker, paroxysmal A. fib on Xarelto, history of breast cancer, Parkinson's disease, restless leg syndrome, urinary incontinence , recurrent UTIs, previously on hospice care following COVID infection now following up with palliative care presented in the ED with generalized weakness, failure to thrive and decreased p.o. intake.  Patient recently finished two rounds of antibiotic. Last antibiotic was Bactrim double strength for seven days  hypercalcemia with acute renal failure/prerenal azotemia failure to thrive poor PO intake Dehydration -- patient came in with calcium of 13.6--- 14.6--- 13.1-- 11.9--10.4 -- continue IV fluids -- received calcitonin -- creatinine improving patient came in with creatinine of 2.02-- 2.07-- 1.63 -- baseline creatinine 0.03 Jun 2020 -- avoid nephrotoxic agents -- encourage oral intake  failure to thrive  Malnutrition -- will give trial of Remeron -- dietitian to see patient -- patient  has been followed by palliative care as outpatient. Will resume that at discharge.  Parkinson's disease --cont levodopa carbidopa  recurrent UTIs -- recently completed two rounds of antibiotics -- denies any urinary symptoms -- continue to monitor  hyperkalemia -- resolved with IV fluids   Procedures: Family communication : son Laura Cisneros at bedside Consults :heparin CODE STATUS: DNR DVT Prophylaxis : Level of care: Progressive Status is: Inpatient  Remains inpatient appropriate because: hypercalcemia, dehydration, failure to thrive  discussed with patient's son Laura Cisneros at bedside. He understands patient  is aging has Parkinson's disease and possible some cognitive decline and early dementia as well. Family would like hospice be involved once patient is discharged at home. Agree with plan. Discussed with TOC regarding arrangement for hospice to follow at home.  Anticipate discharge tomorrow. Family is in agreement and appreciate it      TOTAL TIME TAKING CARE OF THIS PATIENT: 25 minutes.  >50% time spent on counselling and coordination of care  Note: This dictation was prepared with Dragon dictation along with smaller phrase technology. Any transcriptional errors that result from this process are unintentional.  Laura Cisneros M.D    [facility]   CC: Primary care physician; Pcp, No Patient ID: Laura Cisneros, female   DOB: [birth year], 87 y.o.   MRN: [id number]

## 2020-12-29 NOTE — TOC Progression Note (Signed)
Transition of Care [facility]) - Progression Note    Patient Details  Name: Laura Cisneros MRN: [id number] Date of Birth: 1926-01-26  Transition of Care [facility]) CM/SW [place], [place] Phone Number: 12/29/2020, 3:23 PM  Clinical Narrative:     CSW was updated by MD that plan is for home with hospice tomorrow, CSW made referral to [facility] with [facility] to inform of dc tomorrow home with hospice.   NO other needs identified at this time.   Expected Discharge Plan: Home w Hospice Care Barriers to Discharge: Continued Medical Work up  Expected Discharge Plan and Services Expected Discharge Plan: [place] Determinants of Health (SDOH) Interventions    Readmission Risk Interventions No flowsheet data found.

## 2020-12-29 NOTE — Progress Notes (Signed)
[place] [address] [employer] [address] [facility]) Hospital Liaison Note  Received request from Transitions of Care Manager [place], [place], for hospice services at home after discharge. Chart and patient information under review by [facility] physician. Hospice eligibility pending at this time.  Spoke with son Shevy Yaney to initiate education related to hospice philosophy, services and team approach to care. Patient/family verbalized understanding of information provided. Per discussion, the plan for discharge is on 12.3.22 via EMS.  DME needs discussed. Patient has the following equipment in the home: hospital bed, transport chair and BSC. No additional DME required prior to discharge home.   Please send signed and completed DNR home with patient/family. Please provide prescriptions at discharge as needed to ensure ongoing symptom management.   [facility] information and contact numbers given to family. Above information shared with [place].   Please do not hesitate to call with any hospice related questions or concerns.   Thank you for the opportunity to participate in this patient's care.   Bobbie "Loren Racer, RN, BSN [facility] Liaison [phone]

## 2020-12-29 NOTE — TOC Progression Note (Addendum)
Transition of Care [facility]) - Progression Note    Patient Details  Name: Laura Cisneros MRN: [id number] Date of Birth: 10-Aug-1925  Transition of Care [facility]) CM/SW [place], [place] Phone Number: 12/29/2020, 9:36 AM  Clinical Narrative:     Update: [facility] reports for regular long term beds they have a 2 year wait list, they called [name] to inform as well. CSW anticipates plan will be for family to take patient home at discharge as [name] had previously states patient would only agree to go to [facility].     CSW informed patient's son [name] that [facility] reports no SNF beds available until 3 weeks from now. [name] requests CSW follow up with them for long term bed avail, as he is interested in patient staying there but not necessarily for rehab.   CSW has reached out to [facility] pending response. CSW has also informed [name] that since patient will not do rehab at [facility], we are unsure if insurance will cover her stay and may be private pay. [name] agreeable to talk with [facility] about private pay options.     Expected Discharge Plan: [place] Barriers to Discharge: Continued Medical Work up  Expected Discharge Plan and Services Expected Discharge Plan: [place]                                               Social Determinants of Health (SDOH) Interventions    Readmission Risk Interventions No flowsheet data found.

## 2020-12-29 NOTE — Telephone Encounter (Signed)
Palliative NP spoke with son [name] regarding patient. Patient currently hospitalized. [name] states discharge plan is uncertain at this time. We had previously talked about hospice if patient does not improve. [name] states he spoke with SW yesterday at hospital regarding skilled therapy or returning home. He states he does not feel patient will be able to return home given her current state. Will update hospital liaison team.

## 2020-12-29 NOTE — Progress Notes (Signed)
PT Cancellation Note  Patient Details Name: Laura Cisneros MRN: [id number] DOB: [birth year]   Cancelled Treatment:    Reason Eval/Treat Not Completed: Patient declined, no reason specified. Chart reviewed. Pt stating she just got comfortable in bed and would prefer to stay laying down. Family in room. PT will follow up at later time/date.    Patrina Levering PT, DPT 12/29/20 3:36 PM [phone]

## 2020-12-29 NOTE — Evaluation (Addendum)
Clinical/Bedside Swallow Evaluation Patient Details  Name: Laura Cisneros MRN: [id number] Date of Birth: 09/18/1925  Today's Date: 12/29/2020 Time: SLP Start Time (ACUTE ONLY): 7 SLP Stop Time (ACUTE ONLY): 1045 SLP Time Calculation (min) (ACUTE ONLY): 40 min  Past Medical History:  Past Medical History:  Diagnosis Date   A-fib ([place])    unspecified   Anemia    unspecified   Arthritis    osteoarthritis   Bleeding hemorrhoid    Breast cancer (HCC)    unspecified   Cancer (HCC)    Breast   Cataract    Diverticulosis    DVT (deep vein thrombosis) in pregnancy    Hiatal hernia    Hypertension    Incontinence    Osteoarthritis    Osteoporosis    a. Intolerance to Fosamax b. Bilateral foot fractures c. IV Boniva d. Low vitamin D e. Reclast   Parkinson disease ([place])    PE (pulmonary thromboembolism) ([place])    Post-menopausal atrophic vaginitis 01/28/2017   Scleritis    Idiopathic a. Prednisone b. s/p methotrexate   TIA (transient ischemic attack) 1997   Possible   Urinary urgency    Vitamin B12 deficiency    Past Surgical History:  Past Surgical History:  Procedure Laterality Date   ABDOMINAL HYSTERECTOMY     ABDOMINAL HYSTERECTOMY     partial   BLADDER TACK SURGERY     Dr. Ouida Sills   BREAST BIOPSY Left 10/2012   benign   BREAST BIOPSY Left 11/22/2015   invasive mammary ca    BREAST EXCISIONAL BIOPSY Left 12/08/2015   lumpectomy   BREAST LUMPECTOMY Left 11/2015   Invasive mammary carcinoma Grade I   EYE SURGERY Bilateral    Catarct Extraction with IOL   FINGER SURGERY Right    FOOT FRACTURE SURGERY Right 06/2003   pinning, Dr. Francia Greaves, [place] Right 10/02/2016   Procedure: INSERTION PACEMAKER;  Surgeon: Isaias Cowman, MD;  Location: [facility] ORS;  Service: Cardiovascular;  Laterality: Right;   PARTIAL MASTECTOMY WITH AXILLARY SENTINEL LYMPH NODE BIOPSY Left 12/08/2015    Procedure: PARTIAL MASTECTOMY WITH AXILLARY SENTINEL LYMPH NODE BIOPSY;  Surgeon: [name], MD;  Location: [facility] ORS;  Service: General;  Laterality: Left;   HPI:  Pt is a 87 y.o. female with PMH significant for essential hypertension, sick sinus syndrome s/p pacemaker, paroxysmal A. fib on Xarelto, history of breast cancer, Parkinson's disease, restless leg syndrome, urinary incontinence , recurrent UTIs, FTT, previously on hospice care following COVID infection now following up with Palliative care presented in the ED with generalized weakness, failure to thrive and decreased p.o. intake.    History is obtained from son who reports patient had COVID infection 3 years back and she was under hospice care then now following with Palliative care(see recent visit note 11/2020).  Patient recently dealing with recurrent UTIs.  She has completed 14-day treatment with Bactrim 2 days ago. Generally weak and and had a downward decline.  Family has been Pedialyte.  Pt has some baseline Cognitive decline as described per family.  Noted MBSS in 2018: no aspiration noted on study.  DG Esophagus in 2018: Moderate size hiatal hernia.  CXR this admit: Large hiatal hernia.  No sign of acute pulmonary edema or pneumonia.    Assessment / Plan / Recommendation  Clinical Impression  Pt appears at risk for general oropharyngeal  phase dysphagia in light of Significantly declined Cognitive status; Baseline Parkinson's Dis. w/ potential associated Cognitive decline. This can impact her overall awareness/timing of swallow and safety during po tasks which increases risk for aspiration, choking. It can also impact awareness and desire for oral intake in general. Recommend f/u w/ Neurology for formal assessment of Cognitive status; education w/ Family on its impact w/ oral intake. Pt's risk for aspiration is present but can be reduced when following general aspiration precautions and supporting her during oral intake. Modified  solids foods (more minced/blended) may be helpful d/t Edentulous status. She required mod-max verbal/visual cues during po tasks. Son sat w/ pt offering sips via straw when she attended to him.        Pt consumed only few trials of thin liquids via Straw w/ No immediate, overt clinical s/s of aspiration noted w/ remaining consistencies; no decline in vocal quality; no cough, and no decline in respiratory status during/post trials. Oral phase was adequate for bolus management and oral clearing of the liquid boluses given. Pt did NOT attempt to hold Cup during drinking -- this could improve safety of swallowing. OM Exam via observation appeared [facility] w/ No unilateral weakness labial/lingual weakness noted during sips and muttered speech. Oral care could not be completed d/t Confusion.         D/t pt's declined Cognitive status w/ Parkinson's Dis. and her risk for aspiration, recommend continue the dysphagia level 3(mech soft for availability of preferences but broken down well d/t Edentulous status) w/ thin liquids; general aspiration precautions; reduce Distractions during meals and engage pt during po's at meal for self-feeding. Pills Crushed in Puree for safer swallowing. Support w/ feeding at meals as needed. MD/NSG updated.  ST services recommends follow w/ Palliative Care for [place] and education re: impact of Cognitive decline/Parkinson's Dis. on swallowing and overall oral intake. Largely suspect that pt's Cognitive presentation could continue to hamper overall oral intake. Pt has been on Hospice services previously and currently w/ Palliative Care at home. Precautions posted in room. Brief Education w/ Son on impact of Cognitive decline on swallowing; aspiration precautions. Son agreed. He stated pt "may be too much for me to care for at home". Noted this was conveyed to CM/SW.  SLP Visit Diagnosis: Dysphagia, unspecified (R13.10) (impact from Cognitive decline)    Aspiration Risk  Mild aspiration  risk;Risk for inadequate nutrition/hydration    Diet Recommendation   dysphagia level 3(mech soft for availability of preferences but broken down well d/t Edentulous status) w/ thin liquids; general aspiration precautions; reduce Distractions during meals and engage pt during po's at meal for self-feeding. Support w/ feeding at meals.  Medication Administration: Crushed with puree (for safer swallowing)    Other  Recommendations Recommended Consults:  (Palliative Care f/u for [place]; Dietician f/u) Oral Care Recommendations: Oral care BID;Oral care before and after PO;Staff/trained caregiver to provide oral care Other Recommendations:  (n/a)    Recommendations for follow up therapy are one component of a multi-disciplinary discharge planning process, led by the attending physician.  Recommendations may be updated based on patient status, additional functional criteria and insurance authorization.  Follow up Recommendations No SLP follow up      Assistance Recommended at Discharge Frequent or constant Supervision/Assistance (baseline)  Functional Status Assessment Patient has had a recent decline in their functional status and/or demonstrates limited ability to make significant improvements in function in a reasonable and predictable amount of time  Frequency and Duration  (n/a)   (  n/a)       Prognosis Prognosis for Safe Diet Advancement: Guarded Barriers to Reach Goals: Cognitive deficits;Language deficits;Time post onset;Severity of deficits;Behavior (Large Hiatal Hernia; FTT)      Swallow Study   General Date of Onset: 12/27/20 HPI: Pt is a 87 y.o. female with PMH significant for essential hypertension, sick sinus syndrome s/p pacemaker, paroxysmal A. fib on Xarelto, history of breast cancer, Parkinson's disease, restless leg syndrome, urinary incontinence , recurrent UTIs, FTT, previously on hospice care following COVID infection now following up with Palliative care presented in the ED  with generalized weakness, failure to thrive and decreased p.o. intake.    History is obtained from son who reports patient had COVID infection 3 years back and she was under hospice care then now following with Palliative care(see recent visit note 11/2020).  Patient recently dealing with recurrent UTIs.  She has completed 14-day treatment with Bactrim 2 days ago. Generally weak and and had a downward decline.  Family has been Pedialyte.  Pt has some baseline Cognitive decline as described per family.  Noted MBSS in 2018: no aspiration noted on study.  DG Esophagus in 2018: Moderate size hiatal hernia.  CXR this admit: Large hiatal hernia.  No sign of acute pulmonary edema or pneumonia. Type of Study: Bedside Swallow Evaluation Previous Swallow Assessment: MBSS in 2018 Diet Prior to this Study: Dysphagia 3 (soft);Thin liquids Temperature Spikes Noted: No (wbc 7.9) Respiratory Status: Room air History of Recent Intubation: No Behavior/Cognition: Alert;Pleasant mood;Confused;Agitated;Distractible;Requires cueing;Doesn't follow directions Oral Cavity Assessment:  (CNT d/t Cognition) Oral Care Completed by SLP: Recent completion by staff Oral Cavity - Dentition: Edentulous Vision:  (did not attempt) Self-Feeding Abilities: Total assist (Son holding cup/straw at mouth) Patient Positioning: Postural control adequate for testing (fidgity in bed) Baseline Vocal Quality: Low vocal intensity (muttered speeech) Volitional Cough: Cognitively unable to elicit Volitional Swallow: Unable to elicit    Oral/Motor/Sensory Function Overall Oral Motor/Sensory Function:  (appeared grossly WFL w/ few sips of water)   Ice Chips Ice chips: Not tested   Thin Liquid Thin Liquid: Within functional limits Presentation: Straw (4 trials) Other Comments: she attended to, and accepted, 4 trials from Son    Nectar Thick Nectar Thick Liquid: Not tested   Honey Thick Honey Thick Liquid: Not tested   Puree Puree: Not tested    Solid     Solid: Not tested         Orinda Kenner, MS, CCC-SLP Speech Language Pathologist Rehab Services [phone] Sony Schlarb 12/29/2020,11:48 AM

## 2020-12-30 LAB — URINE CULTURE: Culture: 40000 — AB

## 2020-12-30 LAB — GLUCOSE, CAPILLARY
Glucose-Capillary: 64 mg/dL — ABNORMAL LOW (ref 70–99)
Glucose-Capillary: 72 mg/dL (ref 70–99)

## 2020-12-30 NOTE — Discharge Instructions (Signed)
F/u with PCP if needed in 1-2 weeks

## 2020-12-30 NOTE — Progress Notes (Signed)
Patient ID: Laura Cisneros, female   DOB: 1925/09/29, 87 y.o.   MRN: [id number]  spoke with son [name] again on the phone. His other siblings are worried about patient coming home so sleepy. According to the RN night shift reported patient did not sleep much last night. Likely catching up on her sleep. She is got very poor appetite has not eaten much. Discussed with son [name] this could be end-of-life issue. He understands very well. He would like patient to stay one more night. I have asked him to discuss with his other siblings so that everyone is in the same page.

## 2020-12-30 NOTE — TOC Progression Note (Signed)
Transition of Care [facility]) - Progression Note    Patient Details  Name: Laura Cisneros MRN: [id number] Date of Birth: 14-Jul-1925  Transition of Care [facility]) CM/SW Contact  Izola Price, RN Phone Number: 12/30/2020, 5:01 PM  Clinical Narrative:  [facility] scheduled for 10 am Sunday 12/31/20 for transport home address. Informed [facility] of appointment with hospice [facility] for admission meeting at 330 pm. Simmie Davies RN CM      Expected Discharge Plan: Home w Hospice Care Barriers to Discharge:  (DC held.)  Expected Discharge Plan and Services Expected Discharge Plan: [place]         Expected Discharge Date: 12/30/20               DME Arranged: N/A DME Agency: NA         HH Agency: [facility] Date [place]: 12/31/20 Time [place]: 1228 Representative spoke with at [place] at [phone]   Social Determinants of Health ([place]) Interventions    Readmission Risk Interventions No flowsheet data found.

## 2020-12-30 NOTE — Discharge Summary (Signed)
[facility] NAME: Laura Cisneros    MR#:  [id number]  DATE OF BIRTH:  December 04, 1925  DATE OF ADMISSION:  12/27/2020 ADMITTING PHYSICIAN: Fritzi Mandes, MD  DATE OF DISCHARGE: 12/30/2020  PRIMARY CARE PHYSICIAN: Pcp, No    ADMISSION DIAGNOSIS:  Hypercalcemia [E83.52] Dehydration [E86.0] Weakness [R53.1] AKI (acute kidney injury) ([place]) [N17.9]  DISCHARGE DIAGNOSIS:  Hypercalcemia  Dehydration and Acute renal failure  SECONDARY DIAGNOSIS:   Past Medical History:  Diagnosis Date  . A-fib ([place])    unspecified  . Anemia    unspecified  . Arthritis    osteoarthritis  . Bleeding hemorrhoid   . Breast cancer ([place])    unspecified  . Cancer ([place])    Breast  . Cataract   . Diverticulosis   . DVT (deep vein thrombosis) in pregnancy   . Hiatal hernia   . Hypertension   . Incontinence   . Osteoarthritis   . Osteoporosis    a. Intolerance to Fosamax b. Bilateral foot fractures c. IV Boniva d. Low vitamin D e. Reclast  . Parkinson disease ([place])   . PE (pulmonary thromboembolism) ([place])   . Post-menopausal atrophic vaginitis 01/28/2017  . Scleritis    Idiopathic a. Prednisone b. s/p methotrexate  . TIA (transient ischemic attack) 1997   Possible  . Urinary urgency   . Vitamin B12 deficiency     HOSPITAL COURSE:   Laura Cisneros is a 87 y.o. female with PMH significant for essential hypertension, sick sinus syndrome s/p pacemaker, paroxysmal A. fib on Xarelto, history of breast cancer, Parkinson's disease, restless leg syndrome, urinary incontinence , recurrent UTIs, previously on hospice care following COVID infection now following up with palliative care presented in the ED with generalized weakness, failure to thrive and decreased p.o. intake.   Patient recently finished two rounds of antibiotic. Last antibiotic was Bactrim double strength for seven days   hypercalcemia with acute renal failure/prerenal azotemia failure to  thrive poor PO intake Dehydration -- patient came in with calcium of 13.6--- 14.6--- 13.1-- 11.9--10.4 --  received IV fluids -- received calcitonin -- creatinine improving patient came in with creatinine of 2.02-- 2.07-- 1.63-1.2 -- baseline creatinine 0.03 Jun 2020 -- avoid nephrotoxic agents -- encourage oral intake   failure to thrive  Malnutrition -- will give trial of Remeron -- dietitian to see patient -- patient has been followed by palliative care as outpatient. Family is in agreement with hospice to follow given overall decline and patient's condition.   Parkinson's disease --cont levodopa carbidopa   recurrent UTIs -- recently completed two rounds of antibiotics -- denies any urinary symptoms -- continue to monitor   hyperkalemia -- resolved with IV fluids  history of PE -- on oral anticoagulation     Procedures: Family communication : son Laura Cisneros on the phone today Consults :heparin CODE STATUS: DNR DVT Prophylaxis : Level of care: Progressive Status is: Inpatient     discussed with patient's son Laura Cisneros at bedside on 12/29/20. He understands patient is aging has Parkinson's disease and possible some cognitive decline and early dementia as well. Family would like hospice be involved once patient is discharged at home. Agree with plan. Discussed with TOC regarding arrangement for hospice to follow at home. CONSULTS OBTAINED:    DRUG ALLERGIES:  No Known Allergies  DISCHARGE MEDICATIONS:   Allergies as of 12/30/2020   No Known Allergies      Medication List     STOP  taking these medications    calcium carbonate 1500 (600 Ca) MG Tabs tablet Commonly known as: OSCAL   furosemide 40 MG tablet Commonly known as: LASIX   potassium chloride SA 20 MEQ tablet Commonly known as: KLOR-CON M   sulfamethoxazole-trimethoprim 800-160 MG tablet Commonly known as: BACTRIM DS       TAKE these medications    acetaminophen 325 MG tablet Commonly  known as: TYLENOL Take 650 mg by mouth every 6 (six) hours as needed for mild pain or moderate pain.   carbidopa-levodopa 50-200 MG tablet Commonly known as: SINEMET CR Take 3 tablets by mouth 3 (three) times daily.   conjugated estrogens 0.625 MG/GM vaginal cream Commonly known as: PREMARIN Apply 0.5 mg ( pea-sized amount) just inside the vaginal introitus with finger-tip weekly on Monday nights   diltiazem 120 MG 24 hr capsule Commonly known as: CARDIZEM CD Take 120 mg by mouth daily.   Hibiclens 4 % external liquid Generic drug: chlorhexidine Apply topically daily as needed.   lactulose 10 GM/15ML solution Commonly known as: CHRONULAC Take 20 g by mouth daily as needed for severe constipation.   latanoprost 0.005 % ophthalmic solution Commonly known as: XALATAN Place 1 drop into both eyes at bedtime.   melatonin 5 MG Tabs Take 5 mg by mouth at bedtime.   methenamine 1 g tablet Commonly known as: Hiprex Take 1 tablet (1 g total) by mouth 2 (two) times daily with a meal.   ondansetron 4 MG tablet Commonly known as: ZOFRAN Take 4 mg by mouth every 6 (six) hours as needed for nausea or vomiting.   oxybutynin 5 MG tablet Commonly known as: DITROPAN Take 5 mg by mouth 2 (two) times daily.   rivaroxaban 20 MG Tabs tablet Commonly known as: XARELTO Take 20 mg by mouth daily with supper.   rOPINIRole 0.5 MG tablet Commonly known as: REQUIP Take 1 tablet (0.5 mg total) by mouth at bedtime.   senna-docusate 8.6-50 MG tablet Commonly known as: Senokot-S Take 2 tablets by mouth 2 (two) times daily.   vitamin B-12 500 MCG tablet Commonly known as: CYANOCOBALAMIN Take 500 mcg by mouth daily.        If you experience worsening of your admission symptoms, develop shortness of breath, life threatening emergency, suicidal or homicidal thoughts you must seek medical attention immediately by calling 911 or calling your MD immediately  if symptoms less severe.  You Must  read complete instructions/literature along with all the possible adverse reactions/side effects for all the Medicines you take and that have been prescribed to you. Take any new Medicines after you have completely understood and accept all the possible adverse reactions/side effects.   Please note  You were cared for by a hospitalist during your hospital stay. If you have any questions about your discharge medications or the care you received while you were in the hospital after you are discharged, you can call the unit and asked to speak with the hospitalist on call if the hospitalist that took care of you is not available. Once you are discharged, your primary care physician will handle any further medical issues. Please note that NO REFILLS for any discharge medications will be authorized once you are discharged, as it is imperative that you return to your primary care physician (or establish a relationship with a primary care physician if you do not have one) for your aftercare needs so that they can reassess your need for medications and monitor your lab values.  Today   SUBJECTIVE  Resting quietly. Per son in the room slept only few hours last pm   VITAL SIGNS:  Blood pressure 129/73, pulse 61, temperature 98 F (36.7 C), resp. rate 18, height 5' (1.524 m), weight 44.9 kg, SpO2 97 %.  I/O:   Intake/Output Summary (Last 24 hours) at 12/30/2020 0921 Last data filed at 12/30/2020 0648 Gross per 24 hour  Intake 1424.15 ml  Output 300 ml  Net 1124.15 ml    PHYSICAL EXAMINATION:  GENERAL:  87 y.o.-year-old patient lying in the bed with no acute distress. Thin fraile HEENT: Head atraumatic, normocephalic. Oropharynx and nasopharynx clear.  LUNGS: Normal breath sounds bilaterally, no wheezing, rales, rhonchi. No use of accessory muscles of respiration.  CARDIOVASCULAR: S1, S2 normal. No murmurs, rubs, or gallops.  ABDOMEN: Soft, nontender, nondistended.  EXTREMITIES: No cyanosis, clubbing  or edema b/l.    NEUROLOGIC: nonfocal PSYCHIATRIC:  patient is sleepy today SKIN: No obvious rash, lesion, or ulcer.  DATA REVIEW:   CBC  Recent Labs  Lab 12/28/20 0505  WBC 7.9  HGB 13.1  HCT 38.9  PLT 249    Chemistries  Recent Labs  Lab 12/28/20 0505 12/28/20 1343 12/29/20 0521  NA 134*  --  137  K 4.7  --  4.1  CL 99  --  107  CO2 27  --  24  GLUCOSE 78  --  80  BUN 37*  --  34*  CREATININE 1.63*  --  1.22*  CALCIUM 13.1*   < > 10.4*  MG 1.9  --   --   AST 9*  --   --   ALT <5  --   --   ALKPHOS 58  --   --   BILITOT 1.3*  --   --    < > = values in this interval not displayed.    Microbiology Results   Recent Results (from the past 240 hour(s))  Resp Panel by RT-PCR (Flu A&B, Covid) Nasopharyngeal Swab     Status: None   Collection Time: 12/27/20  5:35 PM   Specimen: Nasopharyngeal Swab; Nasopharyngeal(NP) swabs in vial transport medium  Result Value Ref Range Status   SARS Coronavirus 2 by RT PCR NEGATIVE NEGATIVE Final    Comment: (NOTE) SARS-CoV-2 target nucleic acids are NOT DETECTED.  The SARS-CoV-2 RNA is generally detectable in upper respiratory specimens during the acute phase of infection. The lowest concentration of SARS-CoV-2 viral copies this assay can detect is 138 copies/mL. A negative result does not preclude SARS-Cov-2 infection and should not be used as the sole basis for treatment or other patient management decisions. A negative result may occur with  improper specimen collection/handling, submission of specimen other than nasopharyngeal swab, presence of viral mutation(s) within the areas targeted by this assay, and inadequate number of viral copies(<138 copies/mL). A negative result must be combined with clinical observations, patient history, and epidemiological information. The expected result is Negative.  Fact Sheet for Patients:  EntrepreneurPulse.com.au  Fact Sheet for Healthcare Providers:   IncredibleEmployment.be  This test is no t yet approved or cleared by the [place] FDA and  has been authorized for detection and/or diagnosis of SARS-CoV-2 by FDA under an Emergency Use Authorization (EUA). This EUA will remain  in effect (meaning this test can be used) for the duration of the COVID-19 declaration under Section 564(b)(1) of the Act, 21 U.S.C.section 360bbb-3(b)(1), unless the authorization is terminated  or revoked sooner.  Influenza A by PCR NEGATIVE NEGATIVE Final   Influenza B by PCR NEGATIVE NEGATIVE Final    Comment: (NOTE) The Xpert Xpress SARS-CoV-2/FLU/RSV plus assay is intended as an aid in the diagnosis of influenza from Nasopharyngeal swab specimens and should not be used as a sole basis for treatment. Nasal washings and aspirates are unacceptable for Xpert Xpress SARS-CoV-2/FLU/RSV testing.  Fact Sheet for Patients: EntrepreneurPulse.com.au  Fact Sheet for Healthcare Providers: IncredibleEmployment.be  This test is not yet approved or cleared by the [place] FDA and has been authorized for detection and/or diagnosis of SARS-CoV-2 by FDA under an Emergency Use Authorization (EUA). This EUA will remain in effect (meaning this test can be used) for the duration of the COVID-19 declaration under Section 564(b)(1) of the Act, 21 U.S.C. section 360bbb-3(b)(1), unless the authorization is terminated or revoked.  Performed at [facility], [address] [place], [place] [address]   Urine Culture     Status: Abnormal (Preliminary result)   Collection Time: 12/27/20  8:39 PM   Specimen: Urine, Clean Catch  Result Value Ref Range Status   Specimen Description   Final    URINE, CLEAN CATCH Performed at [facility], [address], [place], [place] [address]    Special Requests   Final    NONE Performed at [facility], [place], [place] [address]    Culture (A)  Final    40,000 COLONIES/mL PROTEUS MIRABILIS SUSCEPTIBILITIES TO FOLLOW Performed at [facility], [place] [address] [place], [place] [address]    Report Status PENDING  Incomplete    RADIOLOGY:  No results found.   CODE STATUS:     Code Status Orders  (From admission, onward)           Start     Ordered   12/27/20 1546  Do not attempt resuscitation (DNR)  Continuous       Question Answer Comment  In the event of cardiac or respiratory ARREST Do not call a "code blue"   In the event of cardiac or respiratory ARREST Do not perform Intubation, CPR, defibrillation or ACLS   In the event of cardiac or respiratory ARREST Use medication by any route, position, wound care, and other measures to relive pain and suffering. May use oxygen, suction and manual treatment of airway obstruction as needed for comfort.      12/27/20 1546           Code Status History     Date Active Date Inactive Code Status Order ID Comments User Context   09/29/2016 6948 10/04/2016 2031 Full Code [id number]  Nicholes Mango, MD Inpatient   06/19/2016 0027 06/21/2016 1603 Full Code [id number]  Lance Coon, MD Inpatient      Advance Directive Documentation    Flowsheet Row Most Recent Value  Type of Advance Directive Healthcare Power of Attorney, Living will  Pre-existing out of facility DNR order (yellow form or pink MOST form) --  "MOST" Form in Place? --        TOTAL TIME TAKING CARE OF THIS PATIENT: 40 minutes.    Fritzi Mandes M.D  [facility]    CC: Primary care physician; Pcp, No

## 2020-12-30 NOTE — TOC Transition Note (Addendum)
Transition of Care [facility]) - CM/SW Discharge Note   Patient Details  Name: Laura Cisneros MRN: [id number] Date of Birth: August 03, 1925  Transition [facility]) CM/SW Contact:  Izola Price, RN Phone Number: 12/30/2020, 12:53 PM   Clinical Narrative: Patient will be discharged to Home with [facility]. Confirmed with Tam[facility] at [facility]. [facility] notified for transport and forms printed to unit with reminder about DNR form for EMS. Son [name] notified by phone. Simmie Davies RN CM     350 pm: DC hold per provider. Family may explore hospice home. EMS notified. Simmie Davies RN CM   Final next level of care: Home w Hospice Care Barriers to Discharge: Barriers Resolved   Patient Goals and CMS Choice   CMS Medicare.gov Compare Post Acute Care list provided to:: Patient Represenative (must comment) (son) Choice offered to / list presented to : Adult Children  Discharge Placement                Patient to be transferred to facility by: [facility] Name of family member notified: Son Betina Puckett at 1230 pm by phone call. Patient and family notified of of transfer: 12/30/20  Discharge Plan and Services                DME Arranged: N/A DME Agency: NA         HH Agency: [facility] Date [place]: 12/31/20 Time [place]: 1228 Representative spoke with at [place]: [place] at [phone]  Social Determinants of Health ([place]) Interventions     Readmission Risk Interventions No flowsheet data found.

## 2020-12-31 NOTE — TOC Transition Note (Signed)
Transition of Care [facility]) - CM/SW Discharge Note   Patient Details  Name: Laura Cisneros MRN: [id number] Date of Birth: 05/25/1925  [facility] CM/SW Contact:  Izola Price, RN Phone Number: 12/31/2020, 12:00 PM   Clinical Narrative:   Med. Nec form update and printed to unit this am. Patient was picked up by EMS around 10 am as scheduled to be home for hospice admission assessment at 330 pm today. Simmie Davies RN CM     Final next level of care: Home w Hospice Care Barriers to Discharge:  (DC held.)   Patient Goals and CMS Choice   CMS Medicare.gov Compare Post Acute Care list provided to:: Patient Represenative (must comment) (son) Choice offered to / list presented to : Adult Children  Discharge Placement                Patient to be transferred to facility by: [facility] Name of family member notified: Son Enya Bureau at 1230 pm by phone call. Patient and family notified of of transfer: 12/30/20  Discharge Plan and Services                DME Arranged: N/A DME Agency: NA         HH Agency: [facility] Date [place]: 12/31/20 Time [place]: 1228 Representative spoke with at [place]: [place] at [phone]  Social Determinants of Health ([place]) Interventions     Readmission Risk Interventions No flowsheet data found.

## 2020-12-31 NOTE — Progress Notes (Signed)
Discussed discharge instructions with patient.  Including medications.   Sent home AVS handout.

## 2020-12-31 NOTE — Discharge Summary (Signed)
[facility] [place] NAME: Laura Cisneros    MR#:  [id number]  DATE OF BIRTH:  01-26-1926  DATE OF ADMISSION:  12/27/2020 ADMITTING PHYSICIAN: Fritzi Mandes, MD  DATE OF DISCHARGE: 12/31/2020  PRIMARY CARE PHYSICIAN: Pcp, No    ADMISSION DIAGNOSIS:  Hypercalcemia [E83.52] Dehydration [E86.0] Weakness [R53.1] AKI (acute kidney injury) ([place]) [N17.9]  DISCHARGE DIAGNOSIS:  Hypercalcemia  Dehydration and Acute renal failure  SECONDARY DIAGNOSIS:   Past Medical History:  Diagnosis Date  . A-fib ([place])    unspecified  . Anemia    unspecified  . Arthritis    osteoarthritis  . Bleeding hemorrhoid   . Breast cancer ([place])    unspecified  . Cancer ([place])    Breast  . Cataract   . Diverticulosis   . DVT (deep vein thrombosis) in pregnancy   . Hiatal hernia   . Hypertension   . Incontinence   . Osteoarthritis   . Osteoporosis    a. Intolerance to Fosamax b. Bilateral foot fractures c. IV Boniva d. Low vitamin D e. Reclast  . Parkinson disease ([place])   . PE (pulmonary thromboembolism) ([place])   . Post-menopausal atrophic vaginitis 01/28/2017  . Scleritis    Idiopathic a. Prednisone b. s/p methotrexate  . TIA (transient ischemic attack) 1997   Possible  . Urinary urgency   . Vitamin B12 deficiency     HOSPITAL COURSE:   Laura Cisneros is a 87 y.o. female with PMH significant for essential hypertension, sick sinus syndrome s/p pacemaker, paroxysmal A. fib on Xarelto, history of breast cancer, Parkinson's disease, restless leg syndrome, urinary incontinence , recurrent UTIs, previously on hospice care following COVID infection now following up with palliative care presented in the ED with generalized weakness, failure to thrive and decreased p.o. intake.   Patient recently finished two rounds of antibiotic. Last antibiotic was Bactrim double strength for seven days   hypercalcemia with acute renal failure/prerenal azotemia failure to  thrive poor PO intake Dehydration -- patient came in with calcium of 13.6--- 14.6--- 13.1-- 11.9--10.4 --  received IV fluids -- received calcitonin -- creatinine improving patient came in with creatinine of 2.02-- 2.07-- 1.63-1.2 -- baseline creatinine 0.03 Jun 2020 -- avoid nephrotoxic agents -- encourage oral intake   failure to thrive  Malnutrition -- will give trial of Remeron -- dietitian to see patient -- patient has been followed by palliative care as outpatient. Family is in agreement with hospice to follow given overall decline and patient's condition.   Parkinson's disease --cont levodopa carbidopa   recurrent UTIs -- recently completed two rounds of antibiotics -- denies any urinary symptoms -- continue to monitor   hyperkalemia -- resolved with IV fluids  history of PE -- on oral anticoagulation     Procedures: Family communication : son [name] on the phone today Consults :heparin CODE STATUS: DNR DVT Prophylaxis : Level of care: Progressive Status is: Inpatient     discussed with patient's son [name] at bedside on 12/29/20. He understands patient is aging has Parkinson's disease and possible some cognitive decline and early dementia as well. Family would like hospice be involved once patient is discharged at home. Agree with plan. P t was discharged today at 10 am since EMS transport was arranged at 10 am. I did not get a chance to see the pt today. Per RN pt was alert and took her meds this morning CONSULTS OBTAINED:    DRUG ALLERGIES:  No Known  Allergies  DISCHARGE MEDICATIONS:   Allergies as of 12/31/2020   No Known Allergies      Medication List     STOP taking these medications    calcium carbonate 1500 (600 Ca) MG Tabs tablet Commonly known as: OSCAL   furosemide 40 MG tablet Commonly known as: LASIX   potassium chloride SA 20 MEQ tablet Commonly known as: KLOR-CON M   sulfamethoxazole-trimethoprim 800-160 MG tablet Commonly  known as: BACTRIM DS       TAKE these medications    acetaminophen 325 MG tablet Commonly known as: TYLENOL Take 650 mg by mouth every 6 (six) hours as needed for mild pain or moderate pain.   carbidopa-levodopa 50-200 MG tablet Commonly known as: SINEMET CR Take 3 tablets by mouth 3 (three) times daily.   conjugated estrogens 0.625 MG/GM vaginal cream Commonly known as: PREMARIN Apply 0.5 mg ( pea-sized amount) just inside the vaginal introitus with finger-tip weekly on Monday nights   diltiazem 120 MG 24 hr capsule Commonly known as: CARDIZEM CD Take 120 mg by mouth daily.   Hibiclens 4 % external liquid Generic drug: chlorhexidine Apply topically daily as needed.   lactulose 10 GM/15ML solution Commonly known as: CHRONULAC Take 20 g by mouth daily as needed for severe constipation.   latanoprost 0.005 % ophthalmic solution Commonly known as: XALATAN Place 1 drop into both eyes at bedtime.   melatonin 5 MG Tabs Take 5 mg by mouth at bedtime.   methenamine 1 g tablet Commonly known as: Hiprex Take 1 tablet (1 g total) by mouth 2 (two) times daily with a meal.   ondansetron 4 MG tablet Commonly known as: ZOFRAN Take 4 mg by mouth every 6 (six) hours as needed for nausea or vomiting.   oxybutynin 5 MG tablet Commonly known as: DITROPAN Take 5 mg by mouth 2 (two) times daily.   rivaroxaban 20 MG Tabs tablet Commonly known as: XARELTO Take 20 mg by mouth daily with supper.   rOPINIRole 0.5 MG tablet Commonly known as: REQUIP Take 1 tablet (0.5 mg total) by mouth at bedtime.   senna-docusate 8.6-50 MG tablet Commonly known as: Senokot-S Take 2 tablets by mouth 2 (two) times daily.   vitamin B-12 500 MCG tablet Commonly known as: CYANOCOBALAMIN Take 500 mcg by mouth daily.        If you experience worsening of your admission symptoms, develop shortness of breath, life threatening emergency, suicidal or homicidal thoughts you must seek medical attention  immediately by calling 911 or calling your MD immediately  if symptoms less severe.  You Must read complete instructions/literature along with all the possible adverse reactions/side effects for all the Medicines you take and that have been prescribed to you. Take any new Medicines after you have completely understood and accept all the possible adverse reactions/side effects.   Please note  You were cared for by a hospitalist during your hospital stay. If you have any questions about your discharge medications or the care you received while you were in the hospital after you are discharged, you can call the unit and asked to speak with the hospitalist on call if the hospitalist that took care of you is not available. Once you are discharged, your primary care physician will handle any further medical issues. Please note that NO REFILLS for any discharge medications will be authorized once you are discharged, as it is imperative that you return to your primary care physician (or establish a relationship with a  primary care physician if you do not have one) for your aftercare needs so that they can reassess your need for medications and monitor your lab values.  DATA REVIEW:   CBC  Recent Labs  Lab 12/28/20 0505  WBC 7.9  HGB 13.1  HCT 38.9  PLT 249     Chemistries  Recent Labs  Lab 12/28/20 0505 12/28/20 1343 12/29/20 0521  NA 134*  --  137  K 4.7  --  4.1  CL 99  --  107  CO2 27  --  24  GLUCOSE 78  --  80  BUN 37*  --  34*  CREATININE 1.63*  --  1.22*  CALCIUM 13.1*   < > 10.4*  MG 1.9  --   --   AST 9*  --   --   ALT <5  --   --   ALKPHOS 58  --   --   BILITOT 1.3*  --   --    < > = values in this interval not displayed.     Microbiology Results   Recent Results (from the past 240 hour(s))  Resp Panel by RT-PCR (Flu A&B, Covid) Nasopharyngeal Swab     Status: None   Collection Time: 12/27/20  5:35 PM   Specimen: Nasopharyngeal Swab; Nasopharyngeal(NP) swabs in vial  transport medium  Result Value Ref Range Status   SARS Coronavirus 2 by RT PCR NEGATIVE NEGATIVE Final    Comment: (NOTE) SARS-CoV-2 target nucleic acids are NOT DETECTED.  The SARS-CoV-2 RNA is generally detectable in upper respiratory specimens during the acute phase of infection. The lowest concentration of SARS-CoV-2 viral copies this assay can detect is 138 copies/mL. A negative result does not preclude SARS-Cov-2 infection and should not be used as the sole basis for treatment or other patient management decisions. A negative result may occur with  improper specimen collection/handling, submission of specimen other than nasopharyngeal swab, presence of viral mutation(s) within the areas targeted by this assay, and inadequate number of viral copies(<138 copies/mL). A negative result must be combined with clinical observations, patient history, and epidemiological information. The expected result is Negative.  Fact Sheet for Patients:  EntrepreneurPulse.com.au  Fact Sheet for Healthcare Providers:  IncredibleEmployment.be  This test is no t yet approved or cleared by the [place] FDA and  has been authorized for detection and/or diagnosis of SARS-CoV-2 by FDA under an Emergency Use Authorization (EUA). This EUA will remain  in effect (meaning this test can be used) for the duration of the COVID-19 declaration under Section 564(b)(1) of the Act, 21 U.S.C.section 360bbb-3(b)(1), unless the authorization is terminated  or revoked sooner.       Influenza A by PCR NEGATIVE NEGATIVE Final   Influenza B by PCR NEGATIVE NEGATIVE Final    Comment: (NOTE) The Xpert Xpress SARS-CoV-2/FLU/RSV plus assay is intended as an aid in the diagnosis of influenza from Nasopharyngeal swab specimens and should not be used as a sole basis for treatment. Nasal washings and aspirates are unacceptable for Xpert Xpress SARS-CoV-2/FLU/RSV testing.  Fact  Sheet for Patients: EntrepreneurPulse.com.au  Fact Sheet for Healthcare Providers: IncredibleEmployment.be  This test is not yet approved or cleared by the [place] FDA and has been authorized for detection and/or diagnosis of SARS-CoV-2 by FDA under an Emergency Use Authorization (EUA). This EUA will remain in effect (meaning this test can be used) for the duration of the COVID-19 declaration under Section 564(b)(1) of the Act, 21 U.S.C. section  360bbb-3(b)(1), unless the authorization is terminated or revoked.  Performed at [facility], [address] [place], [place] [address]   Urine Culture     Status: Abnormal   Collection Time: 12/27/20  8:39 PM   Specimen: Urine, Clean Catch  Result Value Ref Range Status   Specimen Description   Final    URINE, CLEAN CATCH Performed at [facility], [address], [place], [place] [address]    Special Requests   Final    NONE Performed at [facility], [place], [place] [address]    Culture 40,000 COLONIES/mL PROTEUS MIRABILIS (A)  Final   Report Status 12/30/2020 FINAL  Final   Organism ID, Bacteria PROTEUS MIRABILIS (A)  Final      Susceptibility   Proteus mirabilis - MIC*    AMPICILLIN <=2 SENSITIVE Sensitive     CEFAZOLIN <=4 SENSITIVE Sensitive     CEFEPIME <=0.12 SENSITIVE Sensitive     CEFTRIAXONE <=0.25 SENSITIVE Sensitive     CIPROFLOXACIN <=0.25 SENSITIVE Sensitive     GENTAMICIN <=1 SENSITIVE Sensitive     IMIPENEM <=0.25 SENSITIVE Sensitive     NITROFURANTOIN >=512 RESISTANT Resistant     TRIMETH/SULFA <=20 SENSITIVE Sensitive     AMPICILLIN/SULBACTAM <=2 SENSITIVE Sensitive     * 40,000 COLONIES/mL PROTEUS MIRABILIS    RADIOLOGY:  No results found.   CODE STATUS:     Code Status Orders  (From admission, onward)           Start     Ordered   12/27/20 1546  Do not attempt resuscitation (DNR)  Continuous        Question Answer Comment  In the event of cardiac or respiratory ARREST Do not call a "code blue"   In the event of cardiac or respiratory ARREST Do not perform Intubation, CPR, defibrillation or ACLS   In the event of cardiac or respiratory ARREST Use medication by any route, position, wound care, and other measures to relive pain and suffering. May use oxygen, suction and manual treatment of airway obstruction as needed for comfort.      12/27/20 1546           Code Status History     Date Active Date Inactive Code Status Order ID Comments User Context   09/29/2016 3291 10/04/2016 2031 Full Code [id number]  Nicholes Mango, MD Inpatient   06/19/2016 0027 06/21/2016 1603 Full Code [id number]  Lance Coon, MD Inpatient      Advance Directive Documentation    Flowsheet Row Most Recent Value  Type of Advance Directive Healthcare Power of Attorney, Living will  Pre-existing out of facility DNR order (yellow form or pink MOST form) --  "MOST" Form in Place? --        TOTAL TIME TAKING CARE OF THIS PATIENT: 35 minutes.    Fritzi Mandes M.D  [facility]    CC: Primary care physician; Pcp, No

## 2021-01-02 ENCOUNTER — Encounter: Payer: Self-pay | Admitting: Hematology and Oncology

## 2021-02-08 ENCOUNTER — Encounter: Payer: Self-pay | Admitting: Hematology and Oncology

## 2021-02-20 ENCOUNTER — Telehealth: Payer: Self-pay | Admitting: Oncology

## 2021-02-20 NOTE — Telephone Encounter (Signed)
Son called to cancel pt appt for 08-Jul-2021. Pt passed away on 2021-03-16

## 2021-02-28 DEATH — deceased

## 2021-06-06 ENCOUNTER — Other Ambulatory Visit: Payer: Medicare Other

## 2021-06-12 ENCOUNTER — Other Ambulatory Visit: Payer: Medicare Other

## 2021-06-12 ENCOUNTER — Ambulatory Visit: Payer: Medicare Other | Admitting: Oncology

## 2021-06-12 ENCOUNTER — Ambulatory Visit: Payer: Medicare Other

## 2021-06-13 ENCOUNTER — Ambulatory Visit: Payer: Medicare Other | Admitting: Oncology

## 2021-06-13 ENCOUNTER — Ambulatory Visit: Payer: Medicare Other

## 2021-06-13 ENCOUNTER — Other Ambulatory Visit: Payer: Medicare Other
